# Patient Record
Sex: Female | Born: 1940 | Race: White | Hispanic: No | Marital: Married | Smoking: Never smoker
Health system: Southern US, Community
[De-identification: ages and names within clinical notes are randomized; demographics above are authoritative.]

## PROBLEM LIST (undated history)

## (undated) DIAGNOSIS — C801 Malignant (primary) neoplasm, unspecified: Secondary | ICD-10-CM

## (undated) DIAGNOSIS — Z9221 Personal history of antineoplastic chemotherapy: Secondary | ICD-10-CM

## (undated) DIAGNOSIS — M810 Age-related osteoporosis without current pathological fracture: Secondary | ICD-10-CM

## (undated) DIAGNOSIS — E785 Hyperlipidemia, unspecified: Secondary | ICD-10-CM

## (undated) DIAGNOSIS — C50919 Malignant neoplasm of unspecified site of unspecified female breast: Secondary | ICD-10-CM

## (undated) DIAGNOSIS — R011 Cardiac murmur, unspecified: Secondary | ICD-10-CM

## (undated) DIAGNOSIS — I10 Essential (primary) hypertension: Secondary | ICD-10-CM

## (undated) DIAGNOSIS — Z923 Personal history of irradiation: Secondary | ICD-10-CM

## (undated) HISTORY — DX: Malignant (primary) neoplasm, unspecified: C80.1

## (undated) HISTORY — DX: Essential (primary) hypertension: I10

## (undated) HISTORY — PX: BREAST BIOPSY: SHX20

## (undated) HISTORY — DX: Hyperlipidemia, unspecified: E78.5

## (undated) HISTORY — DX: Cardiac murmur, unspecified: R01.1

## (undated) HISTORY — DX: Age-related osteoporosis without current pathological fracture: M81.0

## (undated) NOTE — *Deleted (*Deleted)
Depression screen [facility] 2/9 01/25/2020 07/28/2019 07/26/2018  Decreased Interest 0 0 0  Down, Depressed, Hopeless 0 0 0  PHQ - 2 Score 0 0 0

---

## 1985-10-06 HISTORY — PX: ABDOMINAL HYSTERECTOMY: SHX81

## 1998-12-03 ENCOUNTER — Encounter: Payer: Self-pay | Admitting: Surgery

## 1998-12-03 ENCOUNTER — Ambulatory Visit (HOSPITAL_COMMUNITY): Admission: RE | Admit: 1998-12-03 | Discharge: 1998-12-03 | Payer: Self-pay | Admitting: Surgery

## 1999-07-17 ENCOUNTER — Encounter: Admission: RE | Admit: 1999-07-17 | Discharge: 1999-07-17 | Payer: Self-pay | Admitting: Obstetrics and Gynecology

## 2000-07-20 ENCOUNTER — Encounter: Admission: RE | Admit: 2000-07-20 | Discharge: 2000-07-20 | Payer: Self-pay | Admitting: Obstetrics and Gynecology

## 2000-07-20 ENCOUNTER — Encounter: Payer: Self-pay | Admitting: Obstetrics and Gynecology

## 2001-08-12 ENCOUNTER — Encounter: Payer: Self-pay | Admitting: Obstetrics and Gynecology

## 2001-08-12 ENCOUNTER — Encounter: Admission: RE | Admit: 2001-08-12 | Discharge: 2001-08-12 | Payer: Self-pay | Admitting: Obstetrics and Gynecology

## 2002-09-09 ENCOUNTER — Encounter: Payer: Self-pay | Admitting: Obstetrics and Gynecology

## 2002-09-09 ENCOUNTER — Encounter: Admission: RE | Admit: 2002-09-09 | Discharge: 2002-09-09 | Payer: Self-pay | Admitting: Obstetrics and Gynecology

## 2003-10-18 ENCOUNTER — Encounter: Admission: RE | Admit: 2003-10-18 | Discharge: 2003-10-18 | Payer: Self-pay | Admitting: Obstetrics and Gynecology

## 2005-06-20 ENCOUNTER — Encounter: Admission: RE | Admit: 2005-06-20 | Discharge: 2005-06-20 | Payer: Self-pay | Admitting: Obstetrics and Gynecology

## 2005-09-05 ENCOUNTER — Ambulatory Visit: Payer: Self-pay | Admitting: Family Medicine

## 2005-11-18 ENCOUNTER — Ambulatory Visit: Payer: Self-pay | Admitting: Family Medicine

## 2005-11-24 ENCOUNTER — Ambulatory Visit: Payer: Self-pay | Admitting: Internal Medicine

## 2005-11-25 ENCOUNTER — Ambulatory Visit: Payer: Self-pay | Admitting: Family Medicine

## 2005-12-01 ENCOUNTER — Ambulatory Visit: Payer: Self-pay | Admitting: Internal Medicine

## 2006-06-22 ENCOUNTER — Encounter: Admission: RE | Admit: 2006-06-22 | Discharge: 2006-06-22 | Payer: Self-pay | Admitting: Obstetrics and Gynecology

## 2008-04-27 ENCOUNTER — Encounter: Admission: RE | Admit: 2008-04-27 | Discharge: 2008-04-27 | Payer: Self-pay | Admitting: Obstetrics and Gynecology

## 2008-08-02 ENCOUNTER — Encounter: Payer: Self-pay | Admitting: Family Medicine

## 2008-08-03 ENCOUNTER — Ambulatory Visit: Payer: Self-pay | Admitting: Family Medicine

## 2008-08-03 DIAGNOSIS — Z85828 Personal history of other malignant neoplasm of skin: Secondary | ICD-10-CM | POA: Insufficient documentation

## 2008-08-03 DIAGNOSIS — I10 Essential (primary) hypertension: Secondary | ICD-10-CM | POA: Insufficient documentation

## 2008-08-03 LAB — CONVERTED CEMR LAB
Bilirubin Urine: NEGATIVE
Glucose, Urine, Semiquant: NEGATIVE
Nitrite: NEGATIVE
Specific Gravity, Urine: >=1.03
Urobilinogen, UA: 0.2
pH: 5

## 2008-08-07 LAB — CONVERTED CEMR LAB
ALT: 19 units/L (ref 0–35)
AST: 17 units/L (ref 0–37)
Albumin: 4 g/dL (ref 3.5–5.2)
Alkaline Phosphatase: 61 units/L (ref 39–117)
BUN: 19 mg/dL (ref 6–23)
Basophils Absolute: 0 10*3/uL (ref 0.0–0.1)
Basophils Relative: 0.6 % (ref 0.0–3.0)
Bilirubin, Direct: 0.1 mg/dL (ref 0.0–0.3)
CO2: 31 meq/L (ref 19–32)
Calcium: 9.8 mg/dL (ref 8.4–10.5)
Chloride: 107 meq/L (ref 96–112)
Cholesterol: 209 mg/dL (ref 0–200)
Creatinine, Ser: 0.9 mg/dL (ref 0.4–1.2)
Direct LDL: 117.8 mg/dL
Eosinophils Absolute: 0 10*3/uL (ref 0.0–0.7)
Eosinophils Relative: 0.8 % (ref 0.0–5.0)
GFR calc Af Amer: 80 mL/min
GFR calc non Af Amer: 66 mL/min
Glucose, Bld: 102 mg/dL — ABNORMAL HIGH (ref 70–99)
HCT: 38 % (ref 36.0–46.0)
HDL: 61 mg/dL (ref >39.0)
Hemoglobin: 13.2 g/dL (ref 12.0–15.0)
Lymphocytes Relative: 24.7 % (ref 12.0–46.0)
MCHC: 34.8 g/dL (ref 30.0–36.0)
MCV: 89.8 fL (ref 78.0–100.0)
Monocytes Absolute: 0.3 10*3/uL (ref 0.1–1.0)
Monocytes Relative: 6.4 % (ref 3.0–12.0)
Neutro Abs: 3.5 10*3/uL (ref 1.4–7.7)
Neutrophils Relative %: 67.5 % (ref 43.0–77.0)
Platelets: 149 10*3/uL — ABNORMAL LOW (ref 150–400)
Potassium: 4.7 meq/L (ref 3.5–5.1)
RBC: 4.23 M/uL (ref 3.87–5.11)
RDW: 12.3 % (ref 11.5–14.6)
Sodium: 146 meq/L — ABNORMAL HIGH (ref 135–145)
TSH: 3.16 microintl units/mL (ref 0.35–5.50)
Total Bilirubin: 0.7 mg/dL (ref 0.3–1.2)
Total CHOL/HDL Ratio: 3.4
Total Protein: 7.7 g/dL (ref 6.0–8.3)
Triglycerides: 48 mg/dL (ref 0–149)
VLDL: 10 mg/dL (ref 0–40)
WBC: 5 10*3/uL (ref 4.5–10.5)

## 2008-08-09 ENCOUNTER — Ambulatory Visit: Payer: Self-pay | Admitting: Family Medicine

## 2008-08-14 ENCOUNTER — Ambulatory Visit: Payer: Self-pay | Admitting: Gastroenterology

## 2008-08-22 ENCOUNTER — Encounter: Payer: Self-pay | Admitting: Gastroenterology

## 2008-08-22 ENCOUNTER — Ambulatory Visit: Payer: Self-pay | Admitting: Gastroenterology

## 2008-08-28 ENCOUNTER — Encounter: Payer: Self-pay | Admitting: Gastroenterology

## 2009-01-19 ENCOUNTER — Ambulatory Visit: Payer: Self-pay | Admitting: Family Medicine

## 2009-01-19 DIAGNOSIS — R5381 Other malaise: Secondary | ICD-10-CM | POA: Insufficient documentation

## 2009-01-19 DIAGNOSIS — R5383 Other fatigue: Secondary | ICD-10-CM | POA: Insufficient documentation

## 2009-01-19 LAB — CONVERTED CEMR LAB
ALT: 19 units/L (ref 0–35)
AST: 20 units/L (ref 0–37)
Albumin: 3.8 g/dL (ref 3.5–5.2)
Alkaline Phosphatase: 54 units/L (ref 39–117)
BUN: 20 mg/dL (ref 6–23)
Basophils Absolute: 0 10*3/uL (ref 0.0–0.1)
Basophils Relative: 0.8 % (ref 0.0–3.0)
Bilirubin, Direct: 0.1 mg/dL (ref 0.0–0.3)
CO2: 32 meq/L (ref 19–32)
Calcium: 9.3 mg/dL (ref 8.4–10.5)
Chloride: 101 meq/L (ref 96–112)
Creatinine, Ser: 0.6 mg/dL (ref 0.4–1.2)
Eosinophils Absolute: 0.1 10*3/uL (ref 0.0–0.7)
Eosinophils Relative: 1.2 % (ref 0.0–5.0)
Free T4: 0.8 ng/dL (ref 0.6–1.6)
GFR calc non Af Amer: 104.99 mL/min (ref >60)
Glucose, Bld: 98 mg/dL (ref 70–99)
HCT: 35.8 % — ABNORMAL LOW (ref 36.0–46.0)
Hemoglobin: 12.1 g/dL (ref 12.0–15.0)
Lymphocytes Relative: 24.5 % (ref 12.0–46.0)
Lymphs Abs: 1.4 10*3/uL (ref 0.7–4.0)
MCHC: 33.9 g/dL (ref 30.0–36.0)
MCV: 90.2 fL (ref 78.0–100.0)
Monocytes Absolute: 0.5 10*3/uL (ref 0.1–1.0)
Monocytes Relative: 8.3 % (ref 3.0–12.0)
Neutro Abs: 3.9 10*3/uL (ref 1.4–7.7)
Neutrophils Relative %: 65.2 % (ref 43.0–77.0)
Platelets: 185 10*3/uL (ref 150.0–400.0)
Potassium: 3.6 meq/L (ref 3.5–5.1)
RBC: 3.97 M/uL (ref 3.87–5.11)
RDW: 12.3 % (ref 11.5–14.6)
Sed Rate: 34 mm/hr — ABNORMAL HIGH (ref 0–22)
Sodium: 139 meq/L (ref 135–145)
T3, Free: 2.7 pg/mL (ref 2.3–4.2)
TSH: 2.65 microintl units/mL (ref 0.35–5.50)
Total Bilirubin: 0.7 mg/dL (ref 0.3–1.2)
Total Protein: 7.3 g/dL (ref 6.0–8.3)
WBC: 5.9 10*3/uL (ref 4.5–10.5)

## 2009-01-25 ENCOUNTER — Ambulatory Visit: Payer: Self-pay | Admitting: Family Medicine

## 2009-07-18 ENCOUNTER — Ambulatory Visit: Payer: Self-pay | Admitting: Family Medicine

## 2009-07-24 ENCOUNTER — Encounter: Admission: RE | Admit: 2009-07-24 | Discharge: 2009-07-24 | Payer: Self-pay | Admitting: Obstetrics and Gynecology

## 2009-08-27 ENCOUNTER — Ambulatory Visit: Payer: Self-pay | Admitting: Family Medicine

## 2009-08-29 LAB — CONVERTED CEMR LAB
ALT: 17 units/L (ref 0–35)
AST: 21 units/L (ref 0–37)
Albumin: 4.1 g/dL (ref 3.5–5.2)
Alkaline Phosphatase: 50 units/L (ref 39–117)
BUN: 13 mg/dL (ref 6–23)
Basophils Absolute: 0 10*3/uL (ref 0.0–0.1)
Basophils Relative: 0.8 % (ref 0.0–3.0)
Bilirubin, Direct: 0.1 mg/dL (ref 0.0–0.3)
CO2: 32 meq/L (ref 19–32)
Calcium: 9.4 mg/dL (ref 8.4–10.5)
Chloride: 104 meq/L (ref 96–112)
Cholesterol: 214 mg/dL — ABNORMAL HIGH (ref 0–200)
Creatinine, Ser: 0.8 mg/dL (ref 0.4–1.2)
Direct LDL: 140.1 mg/dL
Eosinophils Absolute: 0 10*3/uL (ref 0.0–0.7)
Eosinophils Relative: 0.7 % (ref 0.0–5.0)
GFR calc non Af Amer: 75.2 mL/min (ref >60)
Glucose, Bld: 105 mg/dL — ABNORMAL HIGH (ref 70–99)
HCT: 35.8 % — ABNORMAL LOW (ref 36.0–46.0)
HDL: 56.8 mg/dL (ref >39.00)
Hemoglobin: 12.3 g/dL (ref 12.0–15.0)
Lymphocytes Relative: 26.5 % (ref 12.0–46.0)
Lymphs Abs: 1.2 10*3/uL (ref 0.7–4.0)
MCHC: 34.3 g/dL (ref 30.0–36.0)
MCV: 92.8 fL (ref 78.0–100.0)
Monocytes Absolute: 0.3 10*3/uL (ref 0.1–1.0)
Monocytes Relative: 7.1 % (ref 3.0–12.0)
Neutro Abs: 3 10*3/uL (ref 1.4–7.7)
Neutrophils Relative %: 64.9 % (ref 43.0–77.0)
Platelets: 148 10*3/uL — ABNORMAL LOW (ref 150.0–400.0)
Potassium: 4.4 meq/L (ref 3.5–5.1)
RBC: 3.86 M/uL — ABNORMAL LOW (ref 3.87–5.11)
RDW: 12.8 % (ref 11.5–14.6)
Sodium: 142 meq/L (ref 135–145)
TSH: 2.31 microintl units/mL (ref 0.35–5.50)
Total Bilirubin: 0.8 mg/dL (ref 0.3–1.2)
Total CHOL/HDL Ratio: 4
Total Protein: 7.2 g/dL (ref 6.0–8.3)
Triglycerides: 56 mg/dL (ref 0.0–149.0)
VLDL: 11.2 mg/dL (ref 0.0–40.0)
WBC: 4.5 10*3/uL (ref 4.5–10.5)

## 2009-09-04 ENCOUNTER — Encounter: Payer: Self-pay | Admitting: Family Medicine

## 2009-09-04 ENCOUNTER — Ambulatory Visit: Payer: Self-pay | Admitting: Internal Medicine

## 2010-01-01 ENCOUNTER — Encounter: Payer: Self-pay | Admitting: *Deleted

## 2010-08-14 ENCOUNTER — Encounter: Admission: RE | Admit: 2010-08-14 | Discharge: 2010-08-14 | Payer: Self-pay | Admitting: Family Medicine

## 2010-08-23 ENCOUNTER — Encounter: Payer: Self-pay | Admitting: Family Medicine

## 2010-08-27 ENCOUNTER — Encounter: Admission: RE | Admit: 2010-08-27 | Discharge: 2010-08-27 | Payer: Self-pay | Admitting: Family Medicine

## 2010-08-28 ENCOUNTER — Encounter: Payer: Self-pay | Admitting: Internal Medicine

## 2010-09-03 ENCOUNTER — Encounter: Payer: Self-pay | Admitting: Family Medicine

## 2010-09-03 ENCOUNTER — Ambulatory Visit: Payer: Self-pay | Admitting: Family Medicine

## 2010-09-03 LAB — CONVERTED CEMR LAB
Bilirubin Urine: NEGATIVE
Glucose, Urine, Semiquant: NEGATIVE
Ketones, urine, test strip: NEGATIVE
Nitrite: NEGATIVE
Protein, U semiquant: NEGATIVE
Specific Gravity, Urine: 1.02
Urobilinogen, UA: 0.2
pH: 7

## 2010-09-04 ENCOUNTER — Encounter: Admission: RE | Admit: 2010-09-04 | Discharge: 2010-09-04 | Payer: Self-pay | Admitting: Family Medicine

## 2010-09-05 LAB — CONVERTED CEMR LAB
ALT: 16 units/L (ref 0–35)
AST: 18 units/L (ref 0–37)
Albumin: 4.2 g/dL (ref 3.5–5.2)
Alkaline Phosphatase: 59 units/L (ref 39–117)
BUN: 18 mg/dL (ref 6–23)
Basophils Absolute: 0 10*3/uL (ref 0.0–0.1)
Basophils Relative: 0.7 % (ref 0.0–3.0)
Bilirubin, Direct: 0.1 mg/dL (ref 0.0–0.3)
CO2: 30 meq/L (ref 19–32)
Calcium: 9.6 mg/dL (ref 8.4–10.5)
Chloride: 100 meq/L (ref 96–112)
Cholesterol: 218 mg/dL — ABNORMAL HIGH (ref 0–200)
Creatinine, Ser: 0.8 mg/dL (ref 0.4–1.2)
Direct LDL: 132.8 mg/dL
Eosinophils Absolute: 0.1 10*3/uL (ref 0.0–0.7)
Eosinophils Relative: 0.9 % (ref 0.0–5.0)
GFR calc non Af Amer: 76.08 mL/min (ref >60)
Glucose, Bld: 109 mg/dL — ABNORMAL HIGH (ref 70–99)
HCT: 36.2 % (ref 36.0–46.0)
HDL: 63.3 mg/dL (ref >39.00)
Hemoglobin: 12.5 g/dL (ref 12.0–15.0)
INR: 1 (ref 0.8–1.0)
Lymphocytes Relative: 18.4 % (ref 12.0–46.0)
Lymphs Abs: 1.1 10*3/uL (ref 0.7–4.0)
MCHC: 34.5 g/dL (ref 30.0–36.0)
MCV: 90.8 fL (ref 78.0–100.0)
Monocytes Absolute: 0.4 10*3/uL (ref 0.1–1.0)
Monocytes Relative: 6.3 % (ref 3.0–12.0)
Neutro Abs: 4.4 10*3/uL (ref 1.4–7.7)
Neutrophils Relative %: 73.7 % (ref 43.0–77.0)
Platelets: 183 10*3/uL (ref 150.0–400.0)
Potassium: 4.7 meq/L (ref 3.5–5.1)
Prothrombin Time: 10.9 s (ref 9.7–11.8)
RBC: 3.99 M/uL (ref 3.87–5.11)
RDW: 13.4 % (ref 11.5–14.6)
Sodium: 138 meq/L (ref 135–145)
TSH: 2.72 microintl units/mL (ref 0.35–5.50)
Total Bilirubin: 0.4 mg/dL (ref 0.3–1.2)
Total CHOL/HDL Ratio: 3
Total Protein: 7.1 g/dL (ref 6.0–8.3)
Triglycerides: 58 mg/dL (ref 0.0–149.0)
VLDL: 11.6 mg/dL (ref 0.0–40.0)
WBC: 5.9 10*3/uL (ref 4.5–10.5)
aPTT: 26.8 s (ref 21.7–28.8)

## 2010-09-09 ENCOUNTER — Ambulatory Visit: Payer: Self-pay | Admitting: Family Medicine

## 2010-09-11 ENCOUNTER — Ambulatory Visit (HOSPITAL_COMMUNITY)
Admission: RE | Admit: 2010-09-11 | Discharge: 2010-09-11 | Payer: Self-pay | Source: Home / Self Care | Admitting: General Surgery

## 2010-09-11 ENCOUNTER — Ambulatory Visit: Payer: Self-pay | Admitting: Oncology

## 2010-09-12 ENCOUNTER — Encounter: Payer: Self-pay | Admitting: Family Medicine

## 2010-09-12 LAB — CBC WITH DIFFERENTIAL/PLATELET
BASO%: 0.4 % (ref 0.0–2.0)
EOS%: 0.4 % (ref 0.0–7.0)
HCT: 36.1 % (ref 34.8–46.6)
LYMPH%: 13.5 % — ABNORMAL LOW (ref 14.0–49.7)
MCH: 30.1 pg (ref 25.1–34.0)
MCHC: 33.4 g/dL (ref 31.5–36.0)
MONO#: 0.3 10*3/uL (ref 0.1–0.9)
NEUT%: 81.6 % — ABNORMAL HIGH (ref 38.4–76.8)
Platelets: 191 10*3/uL (ref 145–400)
RBC: 4 10*6/uL (ref 3.70–5.45)
WBC: 8.1 10*3/uL (ref 3.9–10.3)
lymph#: 1.1 10*3/uL (ref 0.9–3.3)

## 2010-09-12 LAB — COMPREHENSIVE METABOLIC PANEL
ALT: 20 U/L (ref 0–35)
AST: 22 U/L (ref 0–37)
CO2: 29 mEq/L (ref 19–32)
Creatinine, Ser: 0.95 mg/dL (ref 0.40–1.20)
Sodium: 138 mEq/L (ref 135–145)
Total Bilirubin: 0.6 mg/dL (ref 0.3–1.2)
Total Protein: 7.7 g/dL (ref 6.0–8.3)

## 2010-09-13 DIAGNOSIS — M779 Enthesopathy, unspecified: Secondary | ICD-10-CM | POA: Insufficient documentation

## 2010-09-16 ENCOUNTER — Ambulatory Visit (HOSPITAL_COMMUNITY)
Admission: RE | Admit: 2010-09-16 | Discharge: 2010-09-16 | Payer: Self-pay | Source: Home / Self Care | Attending: General Surgery | Admitting: General Surgery

## 2010-09-17 ENCOUNTER — Ambulatory Visit (HOSPITAL_BASED_OUTPATIENT_CLINIC_OR_DEPARTMENT_OTHER): Admission: RE | Admit: 2010-09-17 | Payer: Self-pay | Source: Home / Self Care | Admitting: General Surgery

## 2010-10-03 ENCOUNTER — Ambulatory Visit (HOSPITAL_BASED_OUTPATIENT_CLINIC_OR_DEPARTMENT_OTHER): Admission: RE | Admit: 2010-10-03 | Payer: Self-pay | Admitting: General Surgery

## 2010-10-22 ENCOUNTER — Encounter
Admission: RE | Admit: 2010-10-22 | Discharge: 2010-10-22 | Payer: Self-pay | Source: Home / Self Care | Attending: General Surgery | Admitting: General Surgery

## 2010-10-23 ENCOUNTER — Ambulatory Visit
Admission: RE | Admit: 2010-10-23 | Discharge: 2010-10-23 | Payer: Self-pay | Source: Home / Self Care | Attending: General Surgery | Admitting: General Surgery

## 2010-10-23 ENCOUNTER — Encounter
Admission: RE | Admit: 2010-10-23 | Discharge: 2010-10-23 | Payer: Self-pay | Source: Home / Self Care | Attending: General Surgery | Admitting: General Surgery

## 2010-10-23 HISTORY — PX: BREAST LUMPECTOMY: SHX2

## 2010-10-23 LAB — CBC
HCT: 38.6 % (ref 36.0–46.0)
Hemoglobin: 12.6 g/dL (ref 12.0–15.0)
MCH: 29.3 pg (ref 26.0–34.0)
MCHC: 32.6 g/dL (ref 30.0–36.0)
MCV: 89.8 fL (ref 78.0–100.0)
Platelets: 216 10*3/uL (ref 150–400)
RBC: 4.3 MIL/uL (ref 3.87–5.11)
RDW: 13 % (ref 11.5–15.5)
WBC: 5.7 10*3/uL (ref 4.0–10.5)

## 2010-10-23 LAB — COMPREHENSIVE METABOLIC PANEL
ALT: 20 U/L (ref 0–35)
AST: 22 U/L (ref 0–37)
Albumin: 3.8 g/dL (ref 3.5–5.2)
Alkaline Phosphatase: 58 U/L (ref 39–117)
BUN: 16 mg/dL (ref 6–23)
CO2: 30 mEq/L (ref 19–32)
Calcium: 9.5 mg/dL (ref 8.4–10.5)
Chloride: 99 mEq/L (ref 96–112)
Creatinine, Ser: 0.86 mg/dL (ref 0.4–1.2)
GFR calc Af Amer: >60 mL/min (ref >60)
GFR calc non Af Amer: >60 mL/min (ref >60)
Glucose, Bld: 89 mg/dL (ref 70–99)
Potassium: 4.9 mEq/L (ref 3.5–5.1)
Sodium: 137 mEq/L (ref 135–145)
Total Bilirubin: 0.6 mg/dL (ref 0.3–1.2)
Total Protein: 7.3 g/dL (ref 6.0–8.3)

## 2010-10-23 LAB — URINALYSIS, ROUTINE W REFLEX MICROSCOPIC
Bilirubin Urine: NEGATIVE
Hgb urine dipstick: NEGATIVE
Ketones, ur: NEGATIVE mg/dL
Nitrite: NEGATIVE
Protein, ur: NEGATIVE mg/dL
Specific Gravity, Urine: 1.016 (ref 1.005–1.030)
Urine Glucose, Fasting: NEGATIVE mg/dL
Urobilinogen, UA: 0.2 mg/dL (ref 0.0–1.0)
pH: 7.5 (ref 5.0–8.0)

## 2010-10-23 LAB — DIFFERENTIAL
Basophils Absolute: 0 10*3/uL (ref 0.0–0.1)
Basophils Relative: 1 % (ref 0–1)
Eosinophils Absolute: 0.1 10*3/uL (ref 0.0–0.7)
Eosinophils Relative: 1 % (ref 0–5)
Lymphocytes Relative: 23 % (ref 12–46)
Lymphs Abs: 1.3 10*3/uL (ref 0.7–4.0)
Monocytes Absolute: 0.4 10*3/uL (ref 0.1–1.0)
Monocytes Relative: 7 % (ref 3–12)
Neutro Abs: 3.9 10*3/uL (ref 1.7–7.7)
Neutrophils Relative %: 69 % (ref 43–77)

## 2010-10-23 LAB — URINE MICROSCOPIC-ADD ON

## 2010-10-27 ENCOUNTER — Encounter: Payer: Self-pay | Admitting: General Surgery

## 2010-10-28 NOTE — Op Note (Signed)
NAME:  Briana Smith, Briana Smith              ACCOUNT NO.:  [id number]  MEDICAL RECORD NO.:  [id number]          PATIENT TYPE:  AMB  LOCATION:  DSC                          FACILITY:  [facility]  PHYSICIAN:  [name]. Derrell Lolling, M.D.DATE OF BIRTH:  Jan 07, 1941  DATE OF PROCEDURE:  10/23/2010 DATE OF DISCHARGE:                              OPERATIVE REPORT   PREOPERATIVE DIAGNOSIS:  Invasive ductal carcinoma, left breast, receptor positive, HER-2 negative, clinical stage T1c, N0.  POSTOPERATIVE DIAGNOSIS:  Invasive ductal carcinoma, left breast, receptor positive, HER-2 negative, clinical stage T1c, N0.  OPERATIONS PERFORMED: 1. Inject blue dye, left breast. 2. Left partial mastectomy with needle localization. 3. Left axillary sentinel lymph node mapping and biopsy.  SURGEON:  [name]. Derrell Lolling, MD  OPERATIVE INDICATIONS:  This is a 70 year old Caucasian female who underwent screening mammograms and subsequent other imaging studies. There was a small breast mass in the left breast at the 5 o'clock position, 5 cm from the nipple.  Core biopsy showed invasive ductal carcinoma, which is receptor positive and HER-2 negative.  MRI showed a 1.7-cm mass in the left breast at the 5 o'clock position, otherwise negative.  This was felt to be a solitary finding, and there was no adenopathy.  She has been evaluated by Dr. Drue Second. Oncotype DX has been sent but is pending.  The patient was interested in breast conservation.  She was brought to the operating room for left partial mastectomy and sentinel node biopsy.  OPERATIVE TECHNIQUE:  Prior to her surgery, she underwent wire localization at the [facility] [place].  The wire was inserted in the most inferior aspect of the left breast and directed superiorly behind the mass.  The patient was brought to [facility]. Following sedation, her left breast was injected with radionuclide by the nuclear medicine technician.  The patient  was taken to the operating room.  General anesthesia was induced.  Following an alcohol prep, I injected 5 mL of blue dye in the left breast subareolar area.  This was 2 mL of methylene blue mixed with 3 mL of saline.  The breast was massaged for 5 minutes.  Surgical time-out was held identifying correct patient, correct procedure, and correct site.  The left breast and axilla and chest wall and shoulder were then prepped and draped in a sterile fashion.  The marker wire appeared to be almost at the 6 o'clock position.  After marking all the anatomic boundaries, I made a radially oriented elliptical incision around the wire.  Dissection was carried down around the wire going widely around it and deep into the breast tissues.  The specimen was removed, and it was marked with a 6-color margin marker kit.  Specimen mammogram was performed, and it looked like the radiographic abnormality was in the exact center of the specimen.  This was sent for routine histology.  The breast incision was irrigated with saline.  Hemostasis was excellent and achieved with electrocautery.  The breast tissue was closed in three layers, two layers of interrupted 3-0 Vicryl, and then running subcuticular suture of 4-0 Monocryl for the skin.  Attention was  then directed to the left axilla.  A NeoProbe was used to listen to the radioactivity.  Marcaine 0.5% with epinephrine was used as a local infiltration anesthetic.  A curvilinear incision was made at the skin crease just at the lower end of the hairline.  Dissection was carried down through the subcutaneous tissue.  The clavipectoral fascia was incised.  I had dissected out three sentinel lymph nodes.  Two of these were in the level I lymph node area and were hot and blue.  One of these was in the subpectoral location and it was hot only but not blue. After this was done, there really was very little radioactivity in the axilla.  The axilla was irrigated with  saline.  Hemostasis was excellent.  The axilla was closed in layers.  The deeper layer of interrupted 3-0 Vicryl and the skin was closed with a running subcuticular suture of 4-0 Monocryl and Dermabond.  Clean bandages and a breast binder was placed.  The patient tolerated the procedure well and was taken to the recovery room in stable condition.  Estimated blood loss was about 20 mL.  Complications were none.  Sponge, needle, and instrument counts were correct.     [name]. Derrell Lolling, M.D.     HMI/MEDQ  D:  10/23/2010  T:  10/24/2010  Job:  [id number]  cc:   Tinnie Gens A. Tawanna Cooler, MD Drue Second, MD [facility]  Electronically Signed by Claud Kelp M.D. on 10/28/2010 08:40:00 AM

## 2010-11-01 ENCOUNTER — Ambulatory Visit: Payer: Self-pay | Admitting: Oncology

## 2010-11-07 NOTE — Miscellaneous (Signed)
Summary: Consent to Procedure  Consent to Procedure   Imported By: Maryln Gottron 09/11/2010 14:33:46  _____________________________________________________________________  External Attachment:    Type:   Image     Comment:   External Document

## 2010-11-07 NOTE — Consult Note (Signed)
Summary: [facility]  [facility]   Imported By: Maryln Gottron 10/03/2010 08:24:55  _____________________________________________________________________  External Attachment:    Type:   Image     Comment:   External Document

## 2010-11-07 NOTE — Assessment & Plan Note (Signed)
Summary: spot removal//ccm   Vital Signs:  Patient profile:   70 year old female Menstrual status:  hysterectomy Height:      60.25 inches Weight:      172 pounds Pulse rate:   67 / minute BP sitting:   122 / 78  (left arm) Cuff size:   regular  Vitals Entered By: Kathlene November LPN (September 09, 2010 11:54 AM) CC: areas removed from back and left hip   CC:  areas removed from back and left hip.  History of Present Illness: Briana Smith is a 70 year old female, who comes in today for removal of 4 abnormal-looking lesions.  She's had a history of skin cancer in the past.  Lesion number one...Marland KitchenMarland Kitchen 10-mm x 10 mm  just to the left of T 10.  Lower back  Lesion number two.... 15 mm   x 15 mm just to the right of L1.  Lesion number 3.... 15-mm.  x  15-mm left mid thigh.  Lesion number 4  10 mm x 10 mmi........Marland Kitchen   Left lateral thigh  After signed informed consent all 4 lesions were prepped........ shaved excision was done........and all 4 lesions were sent for pathologic analysis. the bases were cauterized and sterile Band-Aids were applied.  The patient left the office in good condition.  No complications.  She was advised on local wound care.  Current Medications (verified): 1)  Zestoretic 20-12.5 Mg Tabs (Lisinopril-Hydrochlorothiazide) .... Take 1/2  Tablet By Mouth Every Morning 2)  Vitamin D 1000 Unit Caps (Cholecalciferol) .... Once Daily 3)  Calcium 1500 Mg Tabs (Calcium Carbonate) .... Once Daily 4)  Timolol Maleate 0.5 % Soln (Timolol Maleate) .... Use As Directed  Allergies (verified): No Known Drug Allergies  Comments:  Nurse/Medical Assistant: The patient's medications and allergies were reviewed with the patient and were updated in the Medication and Allergy Lists. Kathlene November LPN (September 09, 2010 11:55 AM)   Complete Medication List: 1)  Zestoretic 20-12.5 Mg Tabs (Lisinopril-hydrochlorothiazide) .... Take 1/2  tablet by mouth every morning 2)  Vitamin D 1000 Unit Caps  (Cholecalciferol) .... Once daily 3)  Calcium 1500 Mg Tabs (Calcium carbonate) .... Once daily 4)  Timolol Maleate 0.5 % Soln (Timolol maleate) .... Use as directed  Other Orders: Shave Skin Lesion 0.6-1.0 cm/trunk/arm/leg (78295) Prescriptions: ZESTORETIC 20-12.5 MG TABS (LISINOPRIL-HYDROCHLOROTHIAZIDE) Take 1/2  tablet by mouth every morning  #90 x 3   Entered by:   Kern Reap CMA (AAMA)   Authorized by:   Roderick Pee MD   Signed by:   Kern Reap CMA (AAMA) on 09/09/2010   Method used:   Electronically to        [facility]  [phone]* (retail)       [address]       [place], [place]  [address]       Ph: [phone] or [phone]       Fax: [phone]   RxID:   [id number]    Orders Added: 1)  Shave Skin Lesion 0.6-1.0 cm/trunk/arm/leg [11301]

## 2010-11-07 NOTE — Assessment & Plan Note (Signed)
Summary: emp/pt coming in fasting/cjr   Vital Signs:  Patient profile:   70 year old female Menstrual status:  hysterectomy Height:      60.25 inches Weight:      170 pounds BMI:     33.04 Temp:     97.9 degrees F oral BP sitting:   124 / 80  (left arm) Cuff size:   regular  Vitals Entered By: Kern Reap CMA Duncan Dull) (September 03, 2010 8:40 AM) CC: wellness exam     Menstrual Status hysterectomy   CC:  wellness exam.  History of Present Illness: Briana Smith is a delightful, 70 year old, married female, nonsmoker, who comes in today for her annual Medicare wellness exam.  Because of a history of underlying hypertension and newly diagnosed left breast cancer.  Her hypertension has been treated with Zestoretic 20 -- 12.5 daily.  BP 124/80.  She complains of being lightheaded sometimes with change in position therefore, cut her dose in half.  She takes aspirin, calcium, and vitamin D.  She's also been on Premarin, .625 ...Marland KitchenMarland KitchenMarland Kitchen3 times weekly from her GYN doctor [place] since she had a TAH?BSO for fibroids......... no cancer......Marland Kitchen many years ago.  Since the breast cancer was diagnosed last week.  She stopped the hormones.  She gets routine eye care from Dr. 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  She is on eyedrops.  She gets routine dental care is not check her breasts monthly.  However, does mammography.  She is to have an MRI today and a surgical consult with Dr. Derrell Lolling tomorrow.her son, who is a Development worker, community, who graduated from [employer], however,  never did a residency is with an investment  banking firm in [place]  encouraged her to go to [place].  I encouraged her to stay here.she wishes to stay here  Colonoscopy due in 2019, tetanus, 2003, seasonal flu 2010, Pneumovax 2007, shingles.  Vaccine 2010. Here for Medicare AWV:  1.   Risk factors based on Past M, S, F history:..see above 2.   Physical Activities: .Marland Kitchen..walks daily 3.    Depression/mood: good mood.  No depression 4.   Hearing: normal 5.   ADL's: functions independently 6.   Fall Risk: reviewed.  None identified 7.   Home Safety: no guns in the house 8.   Height, weight, &visual acuity:height weight, normal.  Vision normal early glaucoma on eyedrops 9.   Counseling: .Marland Kitchen..see instructions 11.           Referral Coordination....none indicated 12.           Care Plan..follow-up with, surgeon and, radiation oncologist 13.            Cognitive Assessment ..a/o x 3 financially independent   Allergies: No Known Drug Allergies  Past History:  Past medical, surgical, family and social histories (including risk factors) reviewed, and no changes noted (except as noted below).  Past Medical History: Reviewed history from 08/03/2008 and no changes required. childbirth x 3 TAH and BSO for nonmalignant reasons breast biopsy benign Hypertension Skin cancer, hx of  Family History: Reviewed history from 08/03/2008 and no changes required. father died from COPD, a smoker mother died from Alzheimer's disease and underlying diabetes 7 brothers, one has hypertension, when it came to the jaw.  One had bladder cancer.  Mom is obese and hypertensive.  The other 3 in good health two sisters, one with high blood pressure  Social History: Reviewed history from 08/03/2008 and no  changes required. Retired Married Never Smoked Alcohol use-no Drug use-no Regular exercise-yes  Review of Systems      See HPI  Physical Exam  General:  Well-developed,well-nourished,in no acute distress; alert,appropriate and cooperative throughout examination Head:  Normocephalic and atraumatic without obvious abnormalities. No apparent alopecia or balding. Eyes:  No corneal or conjunctival inflammation noted. EOMI. Perrla. Funduscopic exam benign, without hemorrhages, exudates or papilledema. Vision grossly normal. Ears:  External ear exam shows no significant lesions or deformities.   Otoscopic examination reveals clear canals, tympanic membranes are intact bilaterally without bulging, retraction, inflammation or discharge. Hearing is grossly normal bilaterally. Nose:  External nasal examination shows no deformity or inflammation. Nasal mucosa are pink and moist without lesions or exudates. Mouth:  Oral mucosa and oropharynx without lesions or exudates.  Teeth in good repair. Neck:  No deformities, masses, or tenderness noted. Chest Wall:  No deformities, masses, or tenderness noted. Breasts:  Band-Aid and Steri-Strips at the 6 o'clock position left breast from previous biopsy this week.  No palpable masses Lungs:  Normal respiratory effort, chest expands symmetrically. Lungs are clear to auscultation, no crackles or wheezes. Heart:  Normal rate and regular rhythm. S1 and S2 normal without gallop, murmur, click, rub or other extra sounds. Abdomen:  Bowel sounds positive,abdomen soft and non-tender without masses, organomegaly or hernias noted. Rectal:  No external abnormalities noted. Normal sphincter tone. No rectal masses or tenderness. Genitalia:  Pelvic Exam:        External: normal female genitalia without lesions or masses        Vagina: normal without lesions or masses        Cervix: normal without lesions or masses        Adnexa: normal bimanual exam without masses or fullness        Uterus: normal by palpation        Pap smear: not performed Msk:  No deformity or scoliosis noted of thoracic or lumbar spine.   Pulses:  R and L carotid,radial,femoral,dorsalis pedis and posterior tibial pulses are full and equal bilaterally Extremities:  No clubbing, cyanosis, edema, or deformity noted with normal full range of motion of all joints.   Neurologic:  No cranial nerve deficits noted. Station and gait are normal. Plantar reflexes are down-going bilaterally. DTRs are symmetrical throughout. Sensory, motor and coordinative functions appear intact. Skin:  two lesions on her  back two on her left hip that need to be excised Cervical Nodes:  No lymphadenopathy noted Axillary Nodes:  No palpable lymphadenopathy Inguinal Nodes:  No significant adenopathy Psych:  Cognition and judgment appear intact. Alert and cooperative with normal attention span and concentration. No apparent delusions, illusions, hallucinations   Impression & Recommendations:  Problem # 1:  DUCTAL CARCINOMA IN SITU, LEFT BREAST (ICD-233.0) Assessment New  Orders: Venipuncture (16109) TLB-Lipid Panel (80061-LIPID) TLB-BMP (Basic Metabolic Panel-BMET) (80048-METABOL) TLB-CBC Platelet - w/Differential (85025-CBCD) TLB-Hepatic/Liver Function Pnl (80076-HEPATIC) TLB-TSH (Thyroid Stimulating Hormone) (84443-TSH) TLB-PT (Protime) (85610-PTP) TLB-PTT (85730-PTTL) Prescription Created Electronically [phone]) Medicare -1st Annual Wellness Visit [phone]) Urinalysis-dipstick only (Medicare patient) ([id number])  Problem # 2:  HYPERTENSION (ICD-401.9) Assessment: Improved  Her updated medication list for this problem includes:    Zestoretic 20-12.5 Mg Tabs (Lisinopril-hydrochlorothiazide) .Marland Kitchen... Take 1/2  tablet by mouth every morning  Orders: Venipuncture (56213) TLB-Lipid Panel (80061-LIPID) TLB-BMP (Basic Metabolic Panel-BMET) (80048-METABOL) TLB-CBC Platelet - w/Differential (85025-CBCD) TLB-Hepatic/Liver Function Pnl (80076-HEPATIC) TLB-TSH (Thyroid Stimulating Hormone) (84443-TSH) TLB-PT (Protime) (85610-PTP) TLB-PTT (85730-PTTL) Prescription Created Electronically [phone]) Medicare -1st Annual Wellness  Visit [phone]) Urinalysis-dipstick only (Medicare patient) (98119JY) EKG w/ Interpretation (93000)  Problem # 3:  Preventive Health Care (ICD-V70.0) Assessment: Unchanged  Complete Medication List: 1)  Zestoretic 20-12.5 Mg Tabs (Lisinopril-hydrochlorothiazide) .... Take 1/2  tablet by mouth every morning 2)  Vitamin D 1000 Unit Caps (Cholecalciferol) .... Once daily 3)  Calcium 1500 Mg  Tabs (Calcium carbonate) .... Once daily 4)  Timolol Maleate 0.5 % Soln (Timolol maleate) .... Use as directed  Other Orders: Specimen Handling (78295)  Patient Instructions: 1)  stop the aspirin. 2)  Return next week to remove the two lesions on your hips, and the two on your back. 3)  Stop the hormone replacement therapy. 4)  Please schedule a follow-up appointment in 1 year. 5)  Please schedule a follow-up appointment as needed. 6)  It is important that you exercise regularly at least 20 minutes 5 times a week. If you develop chest pain, have severe difficulty breathing, or feel very tired , stop exercising immediately and seek medical attention. 7)  kept the blood pressure medication in half.  Check her blood pressure daily in the morning.  When u  return to have the lesions removed bring  your blood pressure readings and the device Prescriptions: ZESTORETIC 20-12.5 MG TABS (LISINOPRIL-HYDROCHLOROTHIAZIDE) Take 1/2  tablet by mouth every morning  #50 x 3   Entered and Authorized by:   Roderick Pee MD   Signed by:   Roderick Pee MD on 09/03/2010   Method used:   Electronically to        [facility]  [phone]* (retail)       [address]       [place], [place]  [address]       Ph: [phone] or [phone]       Fax: [phone]   RxID:   [id number]    Orders Added: 1)  Venipuncture [29518] 2)  TLB-Lipid Panel [80061-LIPID] 3)  TLB-BMP (Basic Metabolic Panel-BMET) [80048-METABOL] 4)  TLB-CBC Platelet - w/Differential [85025-CBCD] 5)  TLB-Hepatic/Liver Function Pnl [80076-HEPATIC] 6)  TLB-TSH (Thyroid Stimulating Hormone) [84443-TSH] 7)  TLB-PT (Protime) [85610-PTP] 8)  TLB-PTT [85730-PTTL] 9)  Prescription Created Electronically [G8553] 10)  Medicare -1st Annual Wellness Visit [G0438] 11)  Urinalysis-dipstick only (Medicare patient) [81003QW] 12)  EKG w/ Interpretation [93000] 13)  Specimen Handling  [99000]       Laboratory Results   Urine Tests    Routine Urinalysis   Color: yellow Appearance: Clear Glucose: negative   (Normal Range: Negative) Bilirubin: negative   (Normal Range: Negative) Ketone: negative   (Normal Range: Negative) Spec. Gravity: 1.020   (Normal Range: 1.003-1.035) Blood: trace-intact   (Normal Range: Negative) pH: 7.0   (Normal Range: 5.0-8.0) Protein: negative   (Normal Range: Negative) Urobilinogen: 0.2   (Normal Range: 0-1) Nitrite: negative   (Normal Range: Negative) Leukocyte Esterace: trace   (Normal Range: Negative)    Comments: Briana Smith  September 03, 2010 11:43 AM

## 2010-11-07 NOTE — Miscellaneous (Signed)
Summary: eye exam  Clinical Lists Changes  Observations: Added new observation of EYES COMMENT: 12/2010 (01/01/2010 11:04) Added new observation of EYE EXAM BY: bevis (12/12/2009 11:05) Added new observation of DMEYEEXMRES: normal (12/12/2009 11:05) Added new observation of DIAB EYE EX: normal (12/12/2009 11:05)      Diabetes Management History:      She says that she is exercising.    Diabetes Management Exam:    Eye Exam:       Eye Exam done elsewhere          Date: 12/12/2009          Results: normal          Done by: Vonna Kotyk

## 2010-11-27 ENCOUNTER — Other Ambulatory Visit: Payer: Self-pay | Admitting: Oncology

## 2010-11-27 ENCOUNTER — Encounter (HOSPITAL_BASED_OUTPATIENT_CLINIC_OR_DEPARTMENT_OTHER): Payer: PRIVATE HEALTH INSURANCE | Admitting: Oncology

## 2010-11-27 DIAGNOSIS — C50919 Malignant neoplasm of unspecified site of unspecified female breast: Secondary | ICD-10-CM

## 2010-11-27 LAB — COMPREHENSIVE METABOLIC PANEL
AST: 18 U/L (ref 0–37)
BUN: 13 mg/dL (ref 6–23)
Calcium: 9.3 mg/dL (ref 8.4–10.5)
Chloride: 100 mEq/L (ref 96–112)
Creatinine, Ser: 0.75 mg/dL (ref 0.40–1.20)
Total Bilirubin: 0.4 mg/dL (ref 0.3–1.2)

## 2010-11-27 LAB — CBC WITH DIFFERENTIAL/PLATELET
BASO%: 0.3 % (ref 0.0–2.0)
Basophils Absolute: 0 10*3/uL (ref 0.0–0.1)
EOS%: 0.9 % (ref 0.0–7.0)
HCT: 37.3 % (ref 34.8–46.6)
HGB: 12.7 g/dL (ref 11.6–15.9)
LYMPH%: 26.3 % (ref 14.0–49.7)
MCH: 30.1 pg (ref 25.1–34.0)
MCHC: 34 g/dL (ref 31.5–36.0)
MCV: 88.8 fL (ref 79.5–101.0)
MONO%: 8.8 % (ref 0.0–14.0)
NEUT%: 63.7 % (ref 38.4–76.8)
Platelets: 187 10*3/uL (ref 145–400)
lymph#: 1.5 10*3/uL (ref 0.9–3.3)

## 2010-12-13 ENCOUNTER — Encounter (HOSPITAL_BASED_OUTPATIENT_CLINIC_OR_DEPARTMENT_OTHER): Payer: PRIVATE HEALTH INSURANCE | Admitting: Oncology

## 2010-12-18 ENCOUNTER — Encounter (HOSPITAL_BASED_OUTPATIENT_CLINIC_OR_DEPARTMENT_OTHER)
Admission: RE | Admit: 2010-12-18 | Discharge: 2010-12-18 | Disposition: A | Discharge status: Home / Self Care | Payer: Medicare Other | Source: Ambulatory Visit | Attending: General Surgery | Admitting: General Surgery

## 2010-12-18 LAB — BASIC METABOLIC PANEL
Chloride: 103 mEq/L (ref 96–112)
GFR calc non Af Amer: >60 mL/min (ref >60)
Glucose, Bld: 99 mg/dL (ref 70–99)
Potassium: 4.1 mEq/L (ref 3.5–5.1)
Sodium: 137 mEq/L (ref 135–145)

## 2010-12-20 ENCOUNTER — Ambulatory Visit (HOSPITAL_COMMUNITY): Payer: Medicare Other | Attending: General Surgery

## 2010-12-20 ENCOUNTER — Ambulatory Visit (HOSPITAL_BASED_OUTPATIENT_CLINIC_OR_DEPARTMENT_OTHER)
Admission: RE | Admit: 2010-12-20 | Discharge: 2010-12-20 | Disposition: A | Discharge status: Home / Self Care | Payer: Medicare Other | Source: Ambulatory Visit | Attending: General Surgery | Admitting: General Surgery

## 2010-12-20 ENCOUNTER — Ambulatory Visit (HOSPITAL_COMMUNITY): Payer: Medicare Other

## 2010-12-20 DIAGNOSIS — Z01812 Encounter for preprocedural laboratory examination: Secondary | ICD-10-CM | POA: Insufficient documentation

## 2010-12-20 DIAGNOSIS — E669 Obesity, unspecified: Secondary | ICD-10-CM | POA: Insufficient documentation

## 2010-12-20 DIAGNOSIS — C50919 Malignant neoplasm of unspecified site of unspecified female breast: Secondary | ICD-10-CM | POA: Insufficient documentation

## 2010-12-20 DIAGNOSIS — Z01818 Encounter for other preprocedural examination: Secondary | ICD-10-CM | POA: Insufficient documentation

## 2010-12-20 LAB — POCT HEMOGLOBIN-HEMACUE: Hemoglobin: 12.2 g/dL (ref 12.0–15.0)

## 2010-12-23 NOTE — Op Note (Signed)
Briana Smith, DURAND              ACCOUNT NO.:  [id number]  MEDICAL RECORD NO.:  [id number]           PATIENT TYPE:  LOCATION:                                 FACILITY:  PHYSICIAN:  Briana Smith, M.D.DATE OF BIRTH:  1940/10/14  DATE OF PROCEDURE:  12/20/2010 DATE OF DISCHARGE:                              OPERATIVE REPORT   PREOPERATIVE DIAGNOSIS:  Invasive ductal carcinoma, left breast, pT1c, N0.  POSTOPERATIVE DIAGNOSIS:  Invasive ductal carcinoma, left breast.  OPERATION PERFORMED:  Insert 8-French X-Port venous vascular access device using fluoroscopic guidance and using ultrasound guidance.  SURGEON:  Briana Mould. Derrell Lolling, MD  OPERATIVE INDICATIONS:  This is a 70 year old Caucasian female who recently underwent left partial mastectomy and left axillary sentinel node mapping and biopsy for what turns out to be an invasive ductal carcinoma, pathologic stage T1c, N1a, receptor positive, HER2-negative. She has been evaluated by Medical Oncology and they advised that she undergo adjuvant chemotherapy, to be followed by adjuvant whole-breast radiation.  She has been counseled regarding insertion of Port-A-Cath as an outpatient, was brought to the operating room electively.  OPERATIVE TECHNIQUE:  The patient was brought to the operating room. General anesthesia with an LMA device was induced.  The patient was positioned with the arms at her sides and small roll behind her shoulders.  The neck and chest were prepped and draped in sterile fashion.  Intravenous antibiotics were given.  Surgical time-out was held identifying correct patient and correct procedure.  The patient was placed in Trendelenburg.  1% Xylocaine with epinephrine was used for local infiltration anesthetic.  I attempted a right subclavian venipuncture and made three passes and could not get any blood return whatsoever.  I chose to abandon this.  I used the ultrasound in the right neck I could see the  internal jugular vein and carotid artery.  These were actually very small caliber vessels.  I attempted percutaneous venipuncture but was unable to, I actually got the venipuncture needle into the vein, but could not thread the wire.   I then brought the ultrasound probe back and held in place and used that as guide and I observed the needle going into the vein and then got good blood return. I then threaded the wire end of the superior vena cava.  Fluoroscopy was used to confirm the tip of the wire which actually went into the right ventricle, so we pulled this back a bit.  I made a small transverse incision at the site.  I made a transverse incision in the right infraclavicular area, I debrided some of the subcutaneous fat and cleaned off the pectoralis fascia.  Using a tunneling device, I drew the catheter from the wire insertion site in the right neck down into the port pocket site in the right infraclavicular area.  I brought the fluoroscope machine back to the operating field and mapped out a template on the chest wall where I thought the tip of the catheter should be.  I went with the catheter in the superior vena cava just above the right atrium.  I then cut the catheter 22.5 cm which  looked like the right length.  The catheter was then secured the port with a locking device and flushed with heparinized saline.  The port was sutured to the pectoralis fascia with 3 interrupted sutures of 2-0 Prolene.  I was able to flush the port in the catheter one more time.  With the patient in Trendelenburg position, I inserted the dilator and peel-away sheath over the guidewire into the central venous circulation.  This was uneventful.  The wire and dilator were removed.  I had excellent blood return.  I threaded the catheter into the peel-away sheath and removed the peel-away sheath.  The catheter flushed easily and had excellent blood return.  I flushed the port and catheter with concerned  heparin.  Fluoroscopy was then again used and I could see that the tip of the catheter was in the superior vena cava right at the right atrial junction.  There was no deformity of the catheter anywhere along its course.  The subcutaneous tissue was closed with 3-0 Vicryl sutures and skin incisions were closed with subcuticular suture of 4-0 Monocryl and Dermabond.  The patient taken to recovery room in stable condition.  Chest x-ray will be obtained in the recovery room.  Sponge, needle, and instruments counts were correct.     Briana Smith, M.D.     HMI/MEDQ  D:  12/20/2010  T:  12/21/2010  Job:  [id number]  cc:   Valentino Hue. Magrinat, M.D. Jeffrey A. Tawanna Cooler, MD  Electronically Signed by Claud Kelp M.D. on 12/23/2010 09:24:00 AM

## 2010-12-31 ENCOUNTER — Encounter (HOSPITAL_BASED_OUTPATIENT_CLINIC_OR_DEPARTMENT_OTHER): Payer: PRIVATE HEALTH INSURANCE | Admitting: Oncology

## 2010-12-31 ENCOUNTER — Other Ambulatory Visit: Payer: Self-pay | Admitting: Oncology

## 2010-12-31 DIAGNOSIS — C50919 Malignant neoplasm of unspecified site of unspecified female breast: Secondary | ICD-10-CM

## 2010-12-31 DIAGNOSIS — Z5111 Encounter for antineoplastic chemotherapy: Secondary | ICD-10-CM

## 2010-12-31 LAB — CBC WITH DIFFERENTIAL/PLATELET
Basophils Absolute: 0 10*3/uL (ref 0.0–0.1)
Eosinophils Absolute: 0 10*3/uL (ref 0.0–0.5)
HCT: 36.7 % (ref 34.8–46.6)
HGB: 12.6 g/dL (ref 11.6–15.9)
MCV: 89.3 fL (ref 79.5–101.0)
NEUT#: 9.4 10*3/uL — ABNORMAL HIGH (ref 1.5–6.5)
NEUT%: 86.5 % — ABNORMAL HIGH (ref 38.4–76.8)
RDW: 13.2 % (ref 11.2–14.5)
lymph#: 1.1 10*3/uL (ref 0.9–3.3)

## 2010-12-31 LAB — COMPREHENSIVE METABOLIC PANEL
Albumin: 2.8 g/dL — ABNORMAL LOW (ref 3.5–5.2)
BUN: 16 mg/dL (ref 6–23)
Calcium: 7.2 mg/dL — ABNORMAL LOW (ref 8.4–10.5)
Chloride: 112 mEq/L (ref 96–112)
Glucose, Bld: 114 mg/dL — ABNORMAL HIGH (ref 70–99)
Potassium: 2.8 mEq/L — ABNORMAL LOW (ref 3.5–5.3)

## 2011-01-01 ENCOUNTER — Encounter (HOSPITAL_BASED_OUTPATIENT_CLINIC_OR_DEPARTMENT_OTHER): Payer: Medicare Other | Admitting: Oncology

## 2011-01-01 DIAGNOSIS — C50919 Malignant neoplasm of unspecified site of unspecified female breast: Secondary | ICD-10-CM

## 2011-01-01 DIAGNOSIS — Z5189 Encounter for other specified aftercare: Secondary | ICD-10-CM

## 2011-01-07 ENCOUNTER — Encounter (HOSPITAL_BASED_OUTPATIENT_CLINIC_OR_DEPARTMENT_OTHER): Payer: PRIVATE HEALTH INSURANCE | Admitting: Oncology

## 2011-01-07 ENCOUNTER — Other Ambulatory Visit: Payer: Self-pay | Admitting: Oncology

## 2011-01-07 DIAGNOSIS — C50919 Malignant neoplasm of unspecified site of unspecified female breast: Secondary | ICD-10-CM

## 2011-01-07 DIAGNOSIS — B370 Candidal stomatitis: Secondary | ICD-10-CM

## 2011-01-07 DIAGNOSIS — K219 Gastro-esophageal reflux disease without esophagitis: Secondary | ICD-10-CM

## 2011-01-07 LAB — COMPREHENSIVE METABOLIC PANEL
AST: 31 U/L (ref 0–37)
Albumin: 3.7 g/dL (ref 3.5–5.2)
BUN: 21 mg/dL (ref 6–23)
Calcium: 9.3 mg/dL (ref 8.4–10.5)
Chloride: 95 mEq/L — ABNORMAL LOW (ref 96–112)
Potassium: 4.2 mEq/L (ref 3.5–5.3)

## 2011-01-07 LAB — CBC WITH DIFFERENTIAL/PLATELET
Basophils Absolute: 0 10*3/uL (ref 0.0–0.1)
EOS%: 0.3 % (ref 0.0–7.0)
Eosinophils Absolute: 0 10*3/uL (ref 0.0–0.5)
HGB: 12.1 g/dL (ref 11.6–15.9)
MCH: 30.2 pg (ref 25.1–34.0)
MONO#: 0.4 10*3/uL (ref 0.1–0.9)
NEUT#: 2.9 10*3/uL (ref 1.5–6.5)
RDW: 12.9 % (ref 11.2–14.5)
WBC: 4.2 10*3/uL (ref 3.9–10.3)
lymph#: 0.9 10*3/uL (ref 0.9–3.3)

## 2011-01-14 ENCOUNTER — Encounter (HOSPITAL_BASED_OUTPATIENT_CLINIC_OR_DEPARTMENT_OTHER): Payer: PRIVATE HEALTH INSURANCE | Admitting: Oncology

## 2011-01-14 ENCOUNTER — Other Ambulatory Visit: Payer: Self-pay | Admitting: Oncology

## 2011-01-14 DIAGNOSIS — C50919 Malignant neoplasm of unspecified site of unspecified female breast: Secondary | ICD-10-CM

## 2011-01-14 LAB — CBC WITH DIFFERENTIAL/PLATELET
BASO%: 0.8 % (ref 0.0–2.0)
Basophils Absolute: 0.1 10*3/uL (ref 0.0–0.1)
EOS%: 0 % (ref 0.0–7.0)
HGB: 11.6 g/dL (ref 11.6–15.9)
MCH: 30.8 pg (ref 25.1–34.0)
MCHC: 34.4 g/dL (ref 31.5–36.0)
RDW: 13.5 % (ref 11.2–14.5)
lymph#: 1.4 10*3/uL (ref 0.9–3.3)

## 2011-01-14 LAB — COMPREHENSIVE METABOLIC PANEL
ALT: 36 U/L — ABNORMAL HIGH (ref 0–35)
AST: 27 U/L (ref 0–37)
Albumin: 3.5 g/dL (ref 3.5–5.2)
Calcium: 9.1 mg/dL (ref 8.4–10.5)
Chloride: 101 mEq/L (ref 96–112)
Potassium: 4.4 mEq/L (ref 3.5–5.3)
Sodium: 137 mEq/L (ref 135–145)
Total Protein: 6.9 g/dL (ref 6.0–8.3)

## 2011-01-21 ENCOUNTER — Other Ambulatory Visit: Payer: Self-pay | Admitting: Oncology

## 2011-01-21 ENCOUNTER — Encounter (HOSPITAL_BASED_OUTPATIENT_CLINIC_OR_DEPARTMENT_OTHER): Payer: PRIVATE HEALTH INSURANCE | Admitting: Oncology

## 2011-01-21 DIAGNOSIS — Z5111 Encounter for antineoplastic chemotherapy: Secondary | ICD-10-CM

## 2011-01-21 DIAGNOSIS — C50919 Malignant neoplasm of unspecified site of unspecified female breast: Secondary | ICD-10-CM

## 2011-01-21 LAB — COMPREHENSIVE METABOLIC PANEL
AST: 19 U/L (ref 0–37)
Albumin: 4.3 g/dL (ref 3.5–5.2)
Alkaline Phosphatase: 69 U/L (ref 39–117)
BUN: 23 mg/dL (ref 6–23)
Potassium: 4.1 mEq/L (ref 3.5–5.3)
Sodium: 138 mEq/L (ref 135–145)
Total Bilirubin: 0.3 mg/dL (ref 0.3–1.2)

## 2011-01-21 LAB — CBC WITH DIFFERENTIAL/PLATELET
BASO%: 0.1 % (ref 0.0–2.0)
EOS%: 0 % (ref 0.0–7.0)
MCH: 29.3 pg (ref 25.1–34.0)
MCHC: 33.1 g/dL (ref 31.5–36.0)
MONO%: 3.1 % (ref 0.0–14.0)
RDW: 14 % (ref 11.2–14.5)
lymph#: 0.9 10*3/uL (ref 0.9–3.3)

## 2011-01-22 ENCOUNTER — Encounter (HOSPITAL_BASED_OUTPATIENT_CLINIC_OR_DEPARTMENT_OTHER): Payer: Medicare Other | Admitting: Oncology

## 2011-01-22 DIAGNOSIS — C50919 Malignant neoplasm of unspecified site of unspecified female breast: Secondary | ICD-10-CM

## 2011-01-26 IMAGING — MG MM BREAST WIRE LOCALIZATION*L*
3 series · 3 of 3 positions shown · non-contrast
Comparison: none

CLINICAL DATA: Mass 5 o'clock left breast for lumpectomy

[L FB]
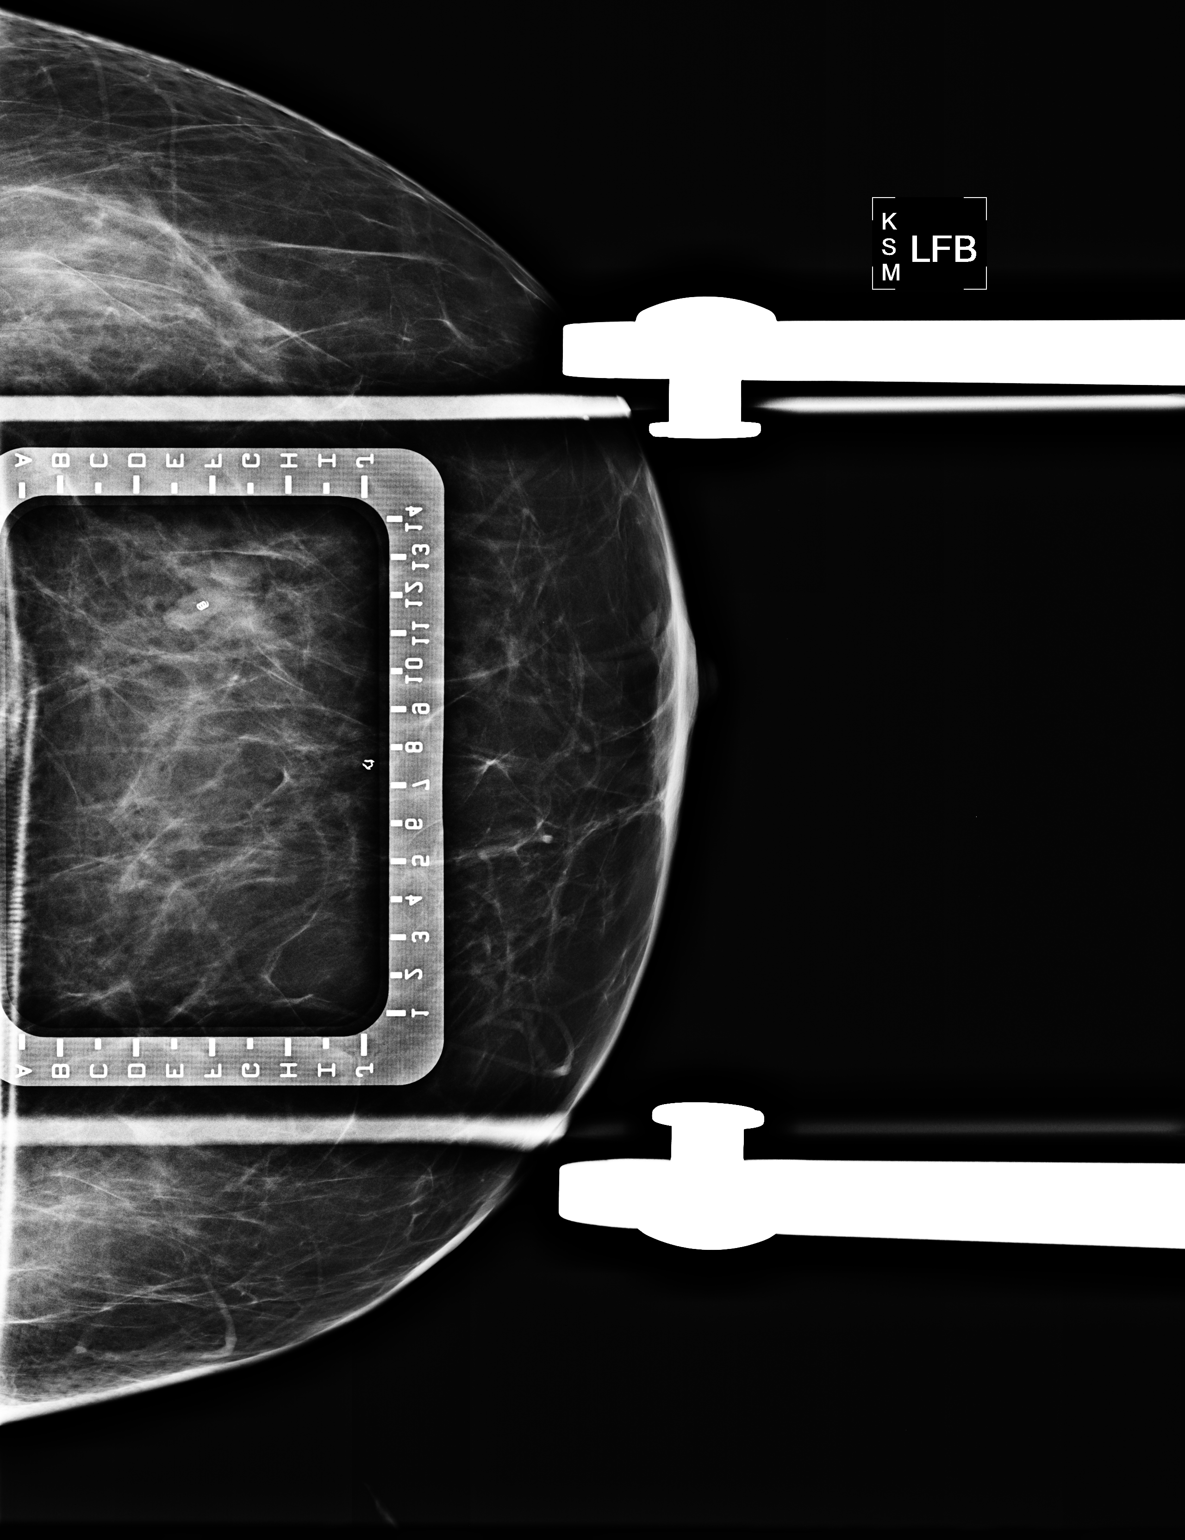

[L ML (1 of 2)]
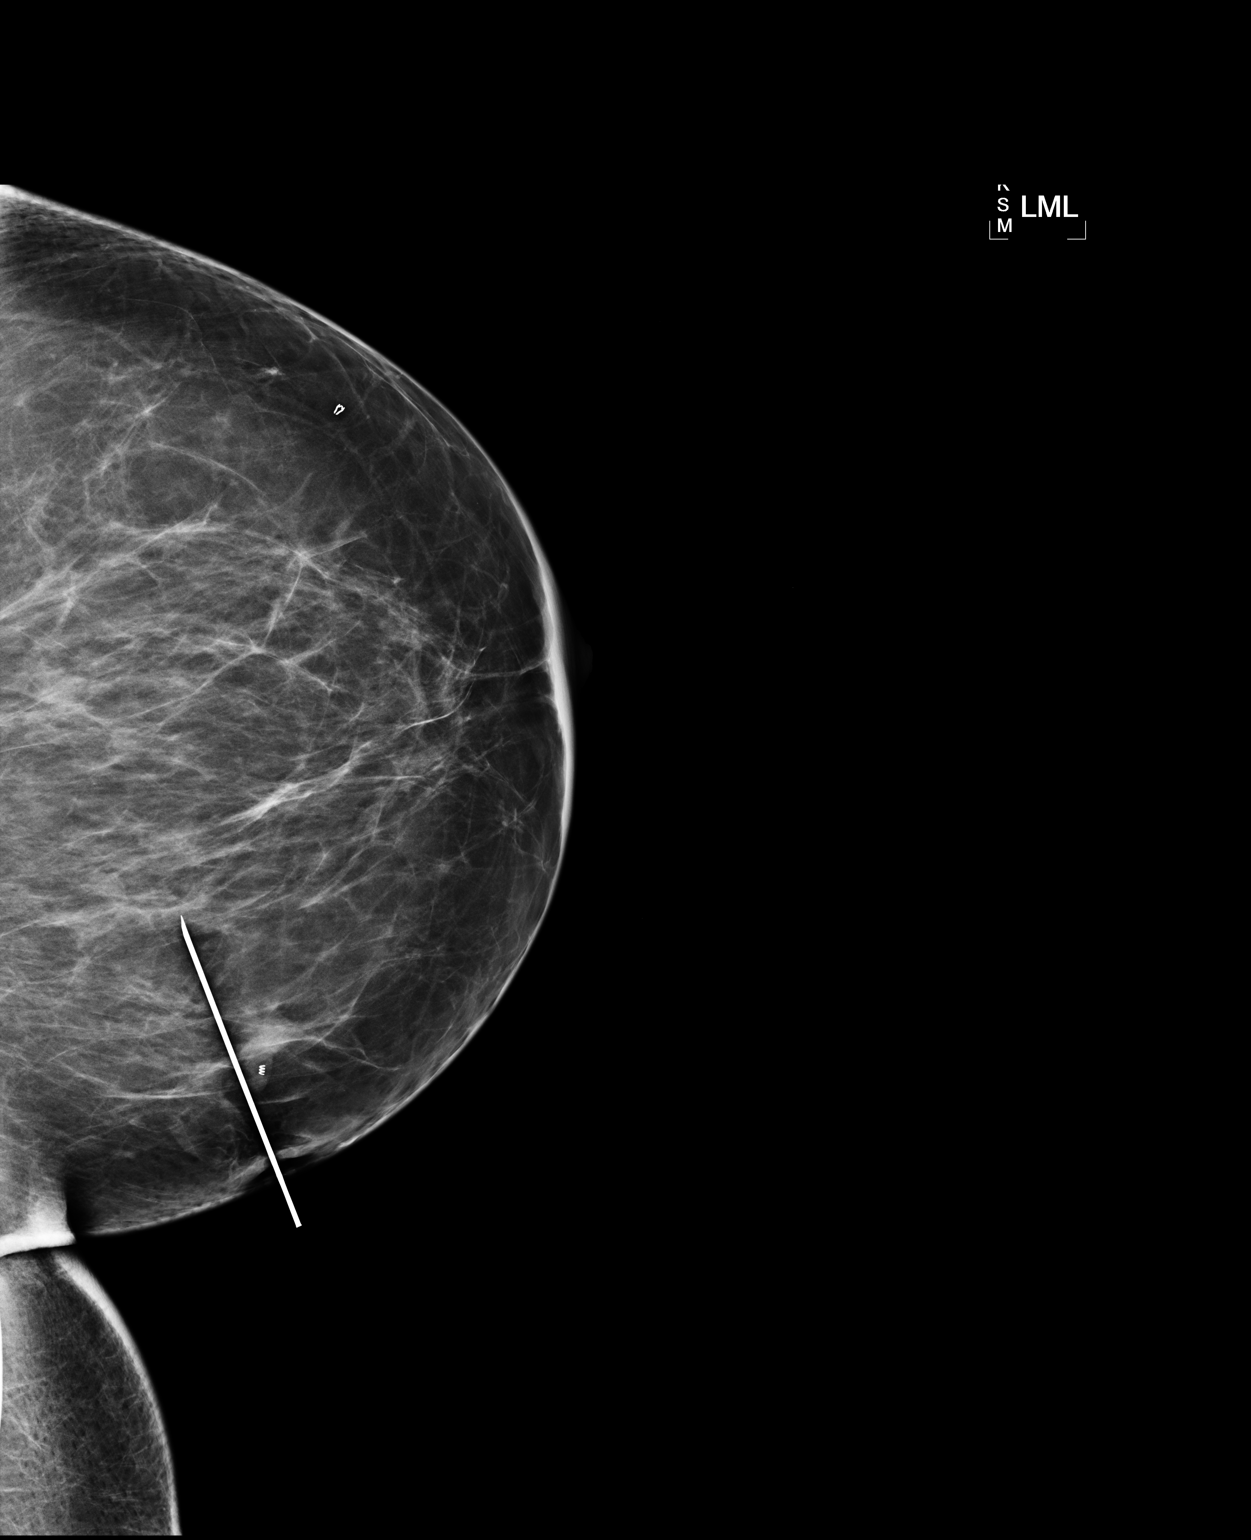

[L ML (2 of 2)]
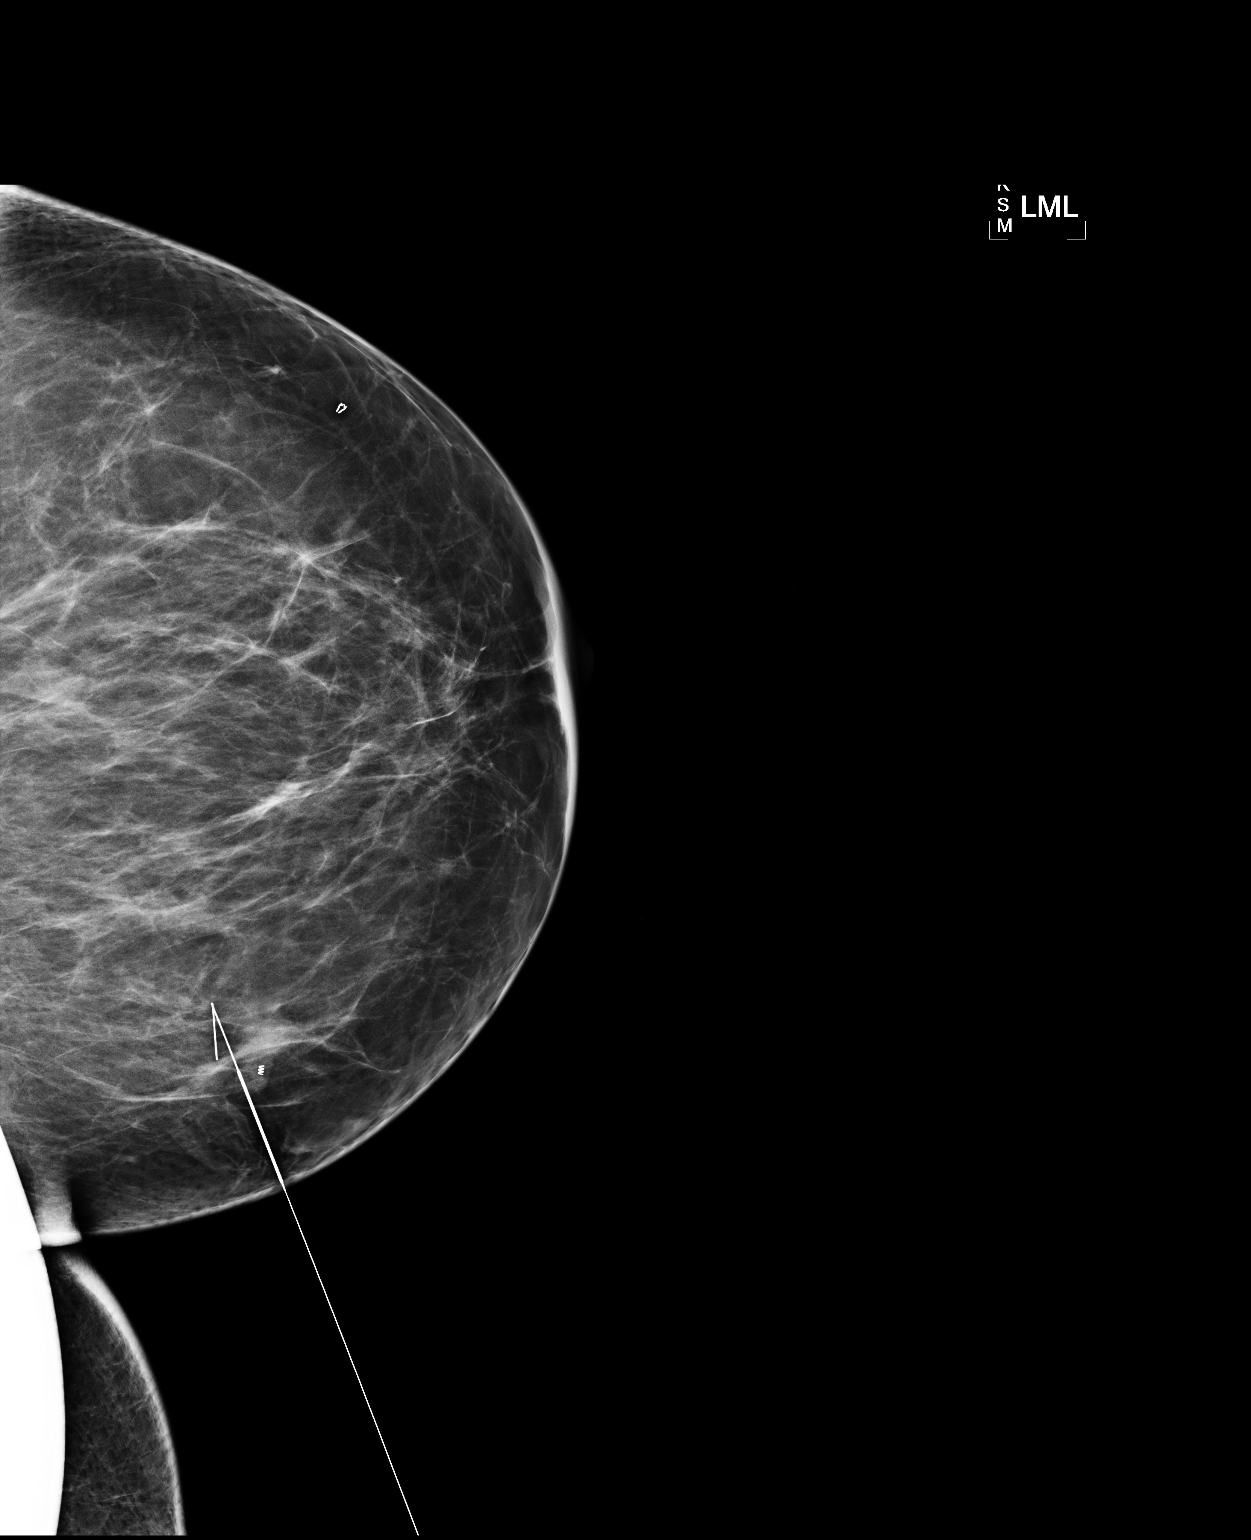

[3 of 3 positions shown; findings below may reference images not displayed]

NEEDLE LOCALIZATION WITH MAMMOGRAPHIC GUIDANCE AND SPECIMEN
RADIOGRAPH

Patient presents for needle localization prior to lumpectomy.  I
met with the patient and we discussed the procedure of needle
localization including risks, benefits, and alternatives.
Specifically, we discussed the risks of infection, bleeding, tissue
injury, and inadequate sampling. Informed written consent was
given.

Using mammographic guidance, sterile technique, 2% lidocaine and a
5 cm modified Kopans needle, the marker clip was localized using a
CC from below approach.  The films are marked for Dr. Olehmu.

Specimen radiograph was performed at day surgery, and confirms wire
and clip present in the tissue sample.  The specimen is marked for
pathology.
IMPRESSION: Needle localization left breast.  No apparent complications.

## 2011-01-28 ENCOUNTER — Encounter (HOSPITAL_BASED_OUTPATIENT_CLINIC_OR_DEPARTMENT_OTHER): Payer: Medicare Other | Admitting: Oncology

## 2011-01-28 ENCOUNTER — Other Ambulatory Visit: Payer: Self-pay | Admitting: Oncology

## 2011-01-28 DIAGNOSIS — Z170 Estrogen receptor positive status [ER+]: Secondary | ICD-10-CM

## 2011-01-28 DIAGNOSIS — C50919 Malignant neoplasm of unspecified site of unspecified female breast: Secondary | ICD-10-CM

## 2011-01-28 DIAGNOSIS — Z5111 Encounter for antineoplastic chemotherapy: Secondary | ICD-10-CM

## 2011-01-28 DIAGNOSIS — Z5189 Encounter for other specified aftercare: Secondary | ICD-10-CM

## 2011-01-28 LAB — CBC WITH DIFFERENTIAL/PLATELET
Basophils Absolute: 0 10*3/uL (ref 0.0–0.1)
EOS%: 1 % (ref 0.0–7.0)
Eosinophils Absolute: 0 10*3/uL (ref 0.0–0.5)
HGB: 11.3 g/dL — ABNORMAL LOW (ref 11.6–15.9)
LYMPH%: 27.1 % (ref 14.0–49.7)
MCH: 30.8 pg (ref 25.1–34.0)
MCV: 88.7 fL (ref 79.5–101.0)
MONO%: 38.3 % — ABNORMAL HIGH (ref 0.0–14.0)
NEUT%: 32.4 % — ABNORMAL LOW (ref 38.4–76.8)
Platelets: 113 10*3/uL — ABNORMAL LOW (ref 145–400)
RDW: 13.8 % (ref 11.2–14.5)

## 2011-02-05 ENCOUNTER — Ambulatory Visit: Payer: Medicare Other | Attending: Radiation Oncology | Admitting: Radiation Oncology

## 2011-02-05 ENCOUNTER — Other Ambulatory Visit: Payer: Self-pay | Admitting: Oncology

## 2011-02-05 DIAGNOSIS — Z510 Encounter for antineoplastic radiation therapy: Secondary | ICD-10-CM | POA: Insufficient documentation

## 2011-02-05 DIAGNOSIS — Z5111 Encounter for antineoplastic chemotherapy: Secondary | ICD-10-CM

## 2011-02-05 DIAGNOSIS — C50919 Malignant neoplasm of unspecified site of unspecified female breast: Secondary | ICD-10-CM

## 2011-02-05 DIAGNOSIS — Z809 Family history of malignant neoplasm, unspecified: Secondary | ICD-10-CM | POA: Insufficient documentation

## 2011-02-05 DIAGNOSIS — C50519 Malignant neoplasm of lower-outer quadrant of unspecified female breast: Secondary | ICD-10-CM | POA: Insufficient documentation

## 2011-02-05 DIAGNOSIS — Z5189 Encounter for other specified aftercare: Secondary | ICD-10-CM

## 2011-02-05 DIAGNOSIS — Z9079 Acquired absence of other genital organ(s): Secondary | ICD-10-CM | POA: Insufficient documentation

## 2011-02-05 DIAGNOSIS — Z800 Family history of malignant neoplasm of digestive organs: Secondary | ICD-10-CM | POA: Insufficient documentation

## 2011-02-05 DIAGNOSIS — Z90710 Acquired absence of both cervix and uterus: Secondary | ICD-10-CM | POA: Insufficient documentation

## 2011-02-05 DIAGNOSIS — I10 Essential (primary) hypertension: Secondary | ICD-10-CM | POA: Insufficient documentation

## 2011-02-05 LAB — CBC WITH DIFFERENTIAL/PLATELET
BASO%: 0.5 % (ref 0.0–2.0)
EOS%: 0.1 % (ref 0.0–7.0)
HCT: 32.4 % — ABNORMAL LOW (ref 34.8–46.6)
LYMPH%: 9.2 % — ABNORMAL LOW (ref 14.0–49.7)
MCH: 30.5 pg (ref 25.1–34.0)
MCHC: 33.8 g/dL (ref 31.5–36.0)
MCV: 90.3 fL (ref 79.5–101.0)
MONO%: 6 % (ref 0.0–14.0)
NEUT%: 84.2 % — ABNORMAL HIGH (ref 38.4–76.8)
Platelets: 141 10*3/uL — ABNORMAL LOW (ref 145–400)
RBC: 3.59 10*6/uL — ABNORMAL LOW (ref 3.70–5.45)
WBC: 14.6 10*3/uL — ABNORMAL HIGH (ref 3.9–10.3)

## 2011-02-11 ENCOUNTER — Other Ambulatory Visit: Payer: Self-pay | Admitting: Oncology

## 2011-02-11 ENCOUNTER — Encounter (HOSPITAL_BASED_OUTPATIENT_CLINIC_OR_DEPARTMENT_OTHER): Payer: Medicare Other | Admitting: Oncology

## 2011-02-11 DIAGNOSIS — C50919 Malignant neoplasm of unspecified site of unspecified female breast: Secondary | ICD-10-CM

## 2011-02-11 DIAGNOSIS — Z170 Estrogen receptor positive status [ER+]: Secondary | ICD-10-CM

## 2011-02-11 DIAGNOSIS — Z5111 Encounter for antineoplastic chemotherapy: Secondary | ICD-10-CM

## 2011-02-11 LAB — COMPREHENSIVE METABOLIC PANEL
ALT: 28 U/L (ref 0–35)
AST: 19 U/L (ref 0–37)
Albumin: 4.2 g/dL (ref 3.5–5.2)
Alkaline Phosphatase: 64 U/L (ref 39–117)
BUN: 21 mg/dL (ref 6–23)
Calcium: 9.5 mg/dL (ref 8.4–10.5)
Chloride: 105 mEq/L (ref 96–112)
Creatinine, Ser: 0.73 mg/dL (ref 0.40–1.20)
Potassium: 4.3 mEq/L (ref 3.5–5.3)

## 2011-02-11 LAB — CBC WITH DIFFERENTIAL/PLATELET
BASO%: 0.1 % (ref 0.0–2.0)
EOS%: 0 % (ref 0.0–7.0)
HCT: 28.8 % — ABNORMAL LOW (ref 34.8–46.6)
MCH: 29.7 pg (ref 25.1–34.0)
MCHC: 33.3 g/dL (ref 31.5–36.0)
MONO#: 0.3 10*3/uL (ref 0.1–0.9)
NEUT%: 89.3 % — ABNORMAL HIGH (ref 38.4–76.8)
RBC: 3.23 10*6/uL — ABNORMAL LOW (ref 3.70–5.45)
WBC: 7.7 10*3/uL (ref 3.9–10.3)
lymph#: 0.6 10*3/uL — ABNORMAL LOW (ref 0.9–3.3)
nRBC: 0 % (ref 0–0)

## 2011-02-12 ENCOUNTER — Encounter (HOSPITAL_BASED_OUTPATIENT_CLINIC_OR_DEPARTMENT_OTHER): Payer: Medicare Other | Admitting: Oncology

## 2011-02-12 DIAGNOSIS — C50919 Malignant neoplasm of unspecified site of unspecified female breast: Secondary | ICD-10-CM

## 2011-02-18 ENCOUNTER — Encounter (HOSPITAL_BASED_OUTPATIENT_CLINIC_OR_DEPARTMENT_OTHER): Payer: Medicare Other | Admitting: Oncology

## 2011-02-18 ENCOUNTER — Other Ambulatory Visit: Payer: Self-pay | Admitting: Oncology

## 2011-02-18 DIAGNOSIS — C50919 Malignant neoplasm of unspecified site of unspecified female breast: Secondary | ICD-10-CM

## 2011-02-18 DIAGNOSIS — Z170 Estrogen receptor positive status [ER+]: Secondary | ICD-10-CM

## 2011-02-18 LAB — CBC WITH DIFFERENTIAL/PLATELET
BASO%: 1.7 % (ref 0.0–2.0)
Basophils Absolute: 0 10*3/uL (ref 0.0–0.1)
EOS%: 2.3 % (ref 0.0–7.0)
HCT: 30.7 % — ABNORMAL LOW (ref 34.8–46.6)
HGB: 10 g/dL — ABNORMAL LOW (ref 11.6–15.9)
LYMPH%: 37.2 % (ref 14.0–49.7)
MCH: 29.4 pg (ref 25.1–34.0)
MCHC: 32.6 g/dL (ref 31.5–36.0)
MCV: 90.3 fL (ref 79.5–101.0)
MONO%: 39.5 % — ABNORMAL HIGH (ref 0.0–14.0)
NEUT%: 19.3 % — ABNORMAL LOW (ref 38.4–76.8)

## 2011-02-25 ENCOUNTER — Encounter: Payer: Medicare Other | Admitting: Oncology

## 2011-02-25 ENCOUNTER — Other Ambulatory Visit: Payer: Self-pay | Admitting: Oncology

## 2011-02-25 LAB — CBC WITH DIFFERENTIAL/PLATELET
BASO%: 0.4 % (ref 0.0–2.0)
Basophils Absolute: 0.1 10*3/uL (ref 0.0–0.1)
EOS%: 0.1 % (ref 0.0–7.0)
MCH: 31.4 pg (ref 25.1–34.0)
MCHC: 34.3 g/dL (ref 31.5–36.0)
MCV: 91.7 fL (ref 79.5–101.0)
MONO%: 3.8 % (ref 0.0–14.0)
RBC: 3.4 10*6/uL — ABNORMAL LOW (ref 3.70–5.45)
RDW: 17.1 % — ABNORMAL HIGH (ref 11.2–14.5)
lymph#: 1.1 10*3/uL (ref 0.9–3.3)

## 2011-03-04 ENCOUNTER — Other Ambulatory Visit: Payer: Self-pay | Admitting: Oncology

## 2011-03-04 ENCOUNTER — Encounter (HOSPITAL_BASED_OUTPATIENT_CLINIC_OR_DEPARTMENT_OTHER): Payer: Medicare Other | Admitting: Oncology

## 2011-03-04 DIAGNOSIS — C50919 Malignant neoplasm of unspecified site of unspecified female breast: Secondary | ICD-10-CM

## 2011-03-04 DIAGNOSIS — Z170 Estrogen receptor positive status [ER+]: Secondary | ICD-10-CM

## 2011-03-04 DIAGNOSIS — Z5111 Encounter for antineoplastic chemotherapy: Secondary | ICD-10-CM

## 2011-03-04 DIAGNOSIS — C50519 Malignant neoplasm of lower-outer quadrant of unspecified female breast: Secondary | ICD-10-CM

## 2011-03-04 LAB — COMPREHENSIVE METABOLIC PANEL
AST: 14 U/L (ref 0–37)
Albumin: 4.3 g/dL (ref 3.5–5.2)
Alkaline Phosphatase: 60 U/L (ref 39–117)
Chloride: 106 mEq/L (ref 96–112)
Glucose, Bld: 140 mg/dL — ABNORMAL HIGH (ref 70–99)
Potassium: 4.3 mEq/L (ref 3.5–5.3)
Sodium: 139 mEq/L (ref 135–145)
Total Protein: 6.5 g/dL (ref 6.0–8.3)

## 2011-03-04 LAB — CBC WITH DIFFERENTIAL/PLATELET
Eosinophils Absolute: 0 10*3/uL (ref 0.0–0.5)
MCV: 92.4 fL (ref 79.5–101.0)
MONO%: 0.6 % (ref 0.0–14.0)
NEUT#: 10.6 10*3/uL — ABNORMAL HIGH (ref 1.5–6.5)
RBC: 3.04 10*6/uL — ABNORMAL LOW (ref 3.70–5.45)
RDW: 18 % — ABNORMAL HIGH (ref 11.2–14.5)
WBC: 11.1 10*3/uL — ABNORMAL HIGH (ref 3.9–10.3)
lymph#: 0.5 10*3/uL — ABNORMAL LOW (ref 0.9–3.3)

## 2011-03-05 ENCOUNTER — Encounter (HOSPITAL_BASED_OUTPATIENT_CLINIC_OR_DEPARTMENT_OTHER): Payer: Medicare Other | Admitting: Oncology

## 2011-03-05 DIAGNOSIS — C50919 Malignant neoplasm of unspecified site of unspecified female breast: Secondary | ICD-10-CM

## 2011-03-11 ENCOUNTER — Encounter (HOSPITAL_BASED_OUTPATIENT_CLINIC_OR_DEPARTMENT_OTHER): Payer: Medicare Other | Admitting: Oncology

## 2011-03-11 ENCOUNTER — Other Ambulatory Visit: Payer: Self-pay | Admitting: Oncology

## 2011-03-11 DIAGNOSIS — R5382 Chronic fatigue, unspecified: Secondary | ICD-10-CM

## 2011-03-11 DIAGNOSIS — Z5111 Encounter for antineoplastic chemotherapy: Secondary | ICD-10-CM

## 2011-03-11 DIAGNOSIS — C50919 Malignant neoplasm of unspecified site of unspecified female breast: Secondary | ICD-10-CM

## 2011-03-11 DIAGNOSIS — Z5189 Encounter for other specified aftercare: Secondary | ICD-10-CM

## 2011-03-11 DIAGNOSIS — Z170 Estrogen receptor positive status [ER+]: Secondary | ICD-10-CM

## 2011-03-11 DIAGNOSIS — C50519 Malignant neoplasm of lower-outer quadrant of unspecified female breast: Secondary | ICD-10-CM

## 2011-03-11 LAB — CBC WITH DIFFERENTIAL/PLATELET
BASO%: 1.3 % (ref 0.0–2.0)
EOS%: 1.3 % (ref 0.0–7.0)
MCH: 30.3 pg (ref 25.1–34.0)
MCHC: 32.8 g/dL (ref 31.5–36.0)
MCV: 92.5 fL (ref 79.5–101.0)
MONO%: 42 % — ABNORMAL HIGH (ref 0.0–14.0)
NEUT#: 0.2 10*3/uL — CL (ref 1.5–6.5)
Platelets: 70 10*3/uL — ABNORMAL LOW (ref 145–400)
RBC: 3.33 10*6/uL — ABNORMAL LOW (ref 3.70–5.45)
RDW: 16.6 % — ABNORMAL HIGH (ref 11.2–14.5)
nRBC: 2 % — ABNORMAL HIGH (ref 0–0)

## 2011-03-12 ENCOUNTER — Encounter (INDEPENDENT_AMBULATORY_CARE_PROVIDER_SITE_OTHER): Payer: Self-pay | Admitting: General Surgery

## 2011-04-22 ENCOUNTER — Ambulatory Visit
Admission: RE | Admit: 2011-04-22 | Discharge: 2011-04-22 | Disposition: A | Discharge status: Home / Self Care | Payer: Medicare Other | Source: Ambulatory Visit | Attending: Oncology | Admitting: Oncology

## 2011-04-22 DIAGNOSIS — C50919 Malignant neoplasm of unspecified site of unspecified female breast: Secondary | ICD-10-CM

## 2011-05-06 ENCOUNTER — Ambulatory Visit
Admission: RE | Admit: 2011-05-06 | Discharge: 2011-05-06 | Disposition: A | Discharge status: Home / Self Care | Payer: Medicare Other | Source: Ambulatory Visit | Attending: Radiation Oncology | Admitting: Radiation Oncology

## 2011-05-06 DIAGNOSIS — R635 Abnormal weight gain: Secondary | ICD-10-CM | POA: Insufficient documentation

## 2011-05-06 DIAGNOSIS — Z510 Encounter for antineoplastic radiation therapy: Secondary | ICD-10-CM | POA: Insufficient documentation

## 2011-05-06 DIAGNOSIS — C50919 Malignant neoplasm of unspecified site of unspecified female breast: Secondary | ICD-10-CM | POA: Insufficient documentation

## 2011-05-07 ENCOUNTER — Other Ambulatory Visit: Payer: Self-pay | Admitting: Oncology

## 2011-05-07 ENCOUNTER — Encounter (HOSPITAL_BASED_OUTPATIENT_CLINIC_OR_DEPARTMENT_OTHER): Payer: Medicare Other | Admitting: Oncology

## 2011-05-07 DIAGNOSIS — Z5189 Encounter for other specified aftercare: Secondary | ICD-10-CM

## 2011-05-07 DIAGNOSIS — Z5111 Encounter for antineoplastic chemotherapy: Secondary | ICD-10-CM

## 2011-05-07 DIAGNOSIS — C50919 Malignant neoplasm of unspecified site of unspecified female breast: Secondary | ICD-10-CM

## 2011-05-07 DIAGNOSIS — Z170 Estrogen receptor positive status [ER+]: Secondary | ICD-10-CM

## 2011-05-07 LAB — COMPREHENSIVE METABOLIC PANEL
ALT: 22 U/L (ref 0–35)
AST: 20 U/L (ref 0–37)
Albumin: 4 g/dL (ref 3.5–5.2)
BUN: 18 mg/dL (ref 6–23)
Calcium: 9.5 mg/dL (ref 8.4–10.5)
Chloride: 105 mEq/L (ref 96–112)
Potassium: 4.3 mEq/L (ref 3.5–5.3)

## 2011-05-07 LAB — CBC WITH DIFFERENTIAL/PLATELET
Basophils Absolute: 0 10*3/uL (ref 0.0–0.1)
EOS%: 2.5 % (ref 0.0–7.0)
Eosinophils Absolute: 0.1 10*3/uL (ref 0.0–0.5)
HGB: 12.3 g/dL (ref 11.6–15.9)
MCH: 31.9 pg (ref 25.1–34.0)
MONO#: 0.3 10*3/uL (ref 0.1–0.9)
NEUT#: 2.2 10*3/uL (ref 1.5–6.5)
RDW: 13.3 % (ref 11.2–14.5)
WBC: 3 10*3/uL — ABNORMAL LOW (ref 3.9–10.3)
lymph#: 0.5 10*3/uL — ABNORMAL LOW (ref 0.9–3.3)

## 2011-05-13 ENCOUNTER — Encounter (HOSPITAL_BASED_OUTPATIENT_CLINIC_OR_DEPARTMENT_OTHER): Payer: Medicare Other | Admitting: Oncology

## 2011-05-13 ENCOUNTER — Other Ambulatory Visit: Payer: Self-pay | Admitting: Oncology

## 2011-05-13 DIAGNOSIS — Z170 Estrogen receptor positive status [ER+]: Secondary | ICD-10-CM

## 2011-05-13 DIAGNOSIS — C50519 Malignant neoplasm of lower-outer quadrant of unspecified female breast: Secondary | ICD-10-CM

## 2011-05-13 DIAGNOSIS — R5382 Chronic fatigue, unspecified: Secondary | ICD-10-CM

## 2011-05-13 DIAGNOSIS — Z853 Personal history of malignant neoplasm of breast: Secondary | ICD-10-CM

## 2011-06-18 ENCOUNTER — Encounter (INDEPENDENT_AMBULATORY_CARE_PROVIDER_SITE_OTHER): Payer: Self-pay | Admitting: General Surgery

## 2011-06-19 ENCOUNTER — Ambulatory Visit
Admission: RE | Admit: 2011-06-19 | Discharge: 2011-06-19 | Disposition: A | Discharge status: Home / Self Care | Payer: Medicare Other | Source: Ambulatory Visit | Attending: Radiation Oncology | Admitting: Radiation Oncology

## 2011-06-24 ENCOUNTER — Ambulatory Visit: Payer: Medicare Other | Attending: Radiation Oncology | Admitting: Physical Therapy

## 2011-06-24 DIAGNOSIS — IMO0001 Reserved for inherently not codable concepts without codable children: Secondary | ICD-10-CM | POA: Insufficient documentation

## 2011-06-24 DIAGNOSIS — R5381 Other malaise: Secondary | ICD-10-CM | POA: Insufficient documentation

## 2011-06-24 DIAGNOSIS — M25619 Stiffness of unspecified shoulder, not elsewhere classified: Secondary | ICD-10-CM | POA: Insufficient documentation

## 2011-07-10 ENCOUNTER — Ambulatory Visit (INDEPENDENT_AMBULATORY_CARE_PROVIDER_SITE_OTHER): Payer: Medicare Other | Admitting: Family Medicine

## 2011-07-10 DIAGNOSIS — Z23 Encounter for immunization: Secondary | ICD-10-CM

## 2011-07-22 ENCOUNTER — Ambulatory Visit (INDEPENDENT_AMBULATORY_CARE_PROVIDER_SITE_OTHER): Payer: Medicare Other | Admitting: General Surgery

## 2011-07-22 ENCOUNTER — Encounter (INDEPENDENT_AMBULATORY_CARE_PROVIDER_SITE_OTHER): Payer: Self-pay | Admitting: General Surgery

## 2011-07-22 VITALS — BP 124/70 | HR 60 | Temp 97.0°F | Resp 16 | Ht 61.0 in | Wt 177.6 lb

## 2011-07-22 DIAGNOSIS — C50519 Malignant neoplasm of lower-outer quadrant of unspecified female breast: Secondary | ICD-10-CM | POA: Insufficient documentation

## 2011-07-22 NOTE — Patient Instructions (Signed)
Your exam today is normal. There is no clinical evidence of recurrent cancer. We will schedule you for Port-A-Cath removal in November, after you get your annual mammograms. I will see you back in one year.

## 2011-07-22 NOTE — Progress Notes (Signed)
Chief Complaint  Patient presents with  . 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 female. This patient underwent left partial mastectomy and sentinel node biopsy on October 23, 2010. The tumor was a pathologic stage TIc., N1, receptor positive, HER-2-negative, Ki-67 44%. A Port-A-Cath was placed on December 20, 2010. She had adjuvant chemotherapy which was completed in May of 2012.  She states she has not had the Port-A-Cath flushed since then. She states Dr. Darnelle Catalan told her she could have it removed.  She completed radiation therapy on or about May 07, 2011.  She is scheduled for mammograms on August 18, 2011 which will be her annual mammograms.  She has no complaint about her breasts. She is pleased with  the cosmetic result. She feels no abnormalities other than the scar. She has no arm symptoms. HPI  Past Medical History  Diagnosis Date  . Hypertension   . Cancer   . Heart murmur   . Hyperlipidemia     Past Surgical History  Procedure Date  . Abdominal hysterectomy 1987  . Breast lumpectomy 10/23/10    left    Family History  Problem Relation Age of Onset  . COPD Father   . Cancer Brother     jaw    Social History History  Substance Use Topics  . Smoking status: Never Smoker   . Smokeless tobacco: Not on file  . Alcohol Use: No    No Known Allergies  Current Outpatient Prescriptions  Medication Sig Dispense Refill  . CALCIUM PO Take by mouth.        . Cholecalciferol (D-3-5 PO) Take by mouth.        Marland Kitchen lisinopril-hydrochlorothiazide (PRINZIDE,ZESTORETIC) 20-12.5 MG per tablet Take 1 tablet by mouth daily.        Marland Kitchen MAGNESIUM PO Take by mouth daily.        . Multiple Vitamin (MULTIVITAMIN) tablet Take 1 tablet by mouth daily.        . tamoxifen (NOLVADEX) 20 MG tablet daily.      . timolol (TIMOPTIC) 0.5 % ophthalmic solution BID times 48H.        Review of  Systems Review of Systems 12 system review of systems is performed and is negative except as described above.Blood pressure 124/70, pulse 60, temperature 97 F (36.1 C), temperature source Temporal, resp. rate 16, height 5\' 1"  (1.549 m), weight 177 lb 9.6 oz (80.559 kg).  Physical Exam Physical Exam Constitutional patient looks well and is in no distress  Neck no adenopathy, no mass, no jugular venous distention.  Lungs clear to auscultation. Port in the right infraclavicular area looks fine. The catheter goes up over the clavicle into the internal jugular vein.  Heart regular rate and rhythm the murmur.  Moderately large. Well-healed scar in the left breast at the five o'clock position. No palpable mass. No other skin changes. No axillary adenopathy. Contour, and nipple projection are good. Minimal volume loss Data Reviewed I reviewed the notes from the consulting physicians. I reviewed my old records.  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  No evidence of recurrence 10 months following left partial mastectomy left sentinel node biopsy.  Status post adjuvant chemotherapy and radiation therapy.  Desires Port-A-Cath removal.   Plan    She will get mammograms in  November 2012  If the mammograms are OK, we will schedule for removal of the Port-A-Cath. She desires this be done under local anesthesia. I think that is reasonable.  Otherwise I will see her back in one year after her annual mammograms.       Briana Smith M 07/22/2011, 4:50 PM

## 2011-08-14 ENCOUNTER — Telehealth: Payer: Self-pay | Admitting: Radiation Oncology

## 2011-08-14 ENCOUNTER — Telehealth: Payer: Self-pay | Admitting: *Deleted

## 2011-08-14 NOTE — Telephone Encounter (Signed)
Patient confirmed over the phone the new date and time of the appointment with amy berry in 10-2011

## 2011-08-14 NOTE — Telephone Encounter (Signed)
Patient called requesting itemized bills for [facility] policy 02/05/11-05/16/11

## 2011-08-18 ENCOUNTER — Ambulatory Visit
Admission: RE | Admit: 2011-08-18 | Discharge: 2011-08-18 | Disposition: A | Discharge status: Home / Self Care | Payer: Medicare Other | Source: Ambulatory Visit | Attending: Oncology | Admitting: Oncology

## 2011-08-18 DIAGNOSIS — Z853 Personal history of malignant neoplasm of breast: Secondary | ICD-10-CM

## 2011-08-20 ENCOUNTER — Other Ambulatory Visit (INDEPENDENT_AMBULATORY_CARE_PROVIDER_SITE_OTHER): Payer: Self-pay

## 2011-08-20 ENCOUNTER — Other Ambulatory Visit (INDEPENDENT_AMBULATORY_CARE_PROVIDER_SITE_OTHER): Payer: Self-pay | Admitting: General Surgery

## 2011-09-01 ENCOUNTER — Other Ambulatory Visit (INDEPENDENT_AMBULATORY_CARE_PROVIDER_SITE_OTHER): Payer: Self-pay | Admitting: General Surgery

## 2011-09-01 NOTE — H&P (Signed)
  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 female. This patient underwent left partial mastectomy and sentinel node biopsy on October 23, 2010. The tumor was a pathologic stage TIc., N1, receptor positive, HER-2-negative, Ki-67 44%. A Port-A-Cath was placed on December 20, 2010. She had adjuvant chemotherapy which was completed in May of 2012.   She states she has not had the Port-A-Cath flushed since then. She states Dr. Magrinat told her she could have it removed.   She completed radiation therapy on or about May 07, 2011.   She is scheduled for mammograms on August 18, 2011 which will be her annual mammograms.   She has no complaint about her breasts. She is pleased with  the cosmetic result. She feels no abnormalities other than the scar. She has no arm symptoms. HPI    Past Medical History   Diagnosis  Date   .  Hypertension     .  Cancer     .  Heart murmur     .  Hyperlipidemia         Past Surgical History   Procedure  Date   .  Abdominal hysterectomy  1987   .  Breast lumpectomy  10/23/10       left       Family History     Problem  Relation  Age of Onset   .  COPD  Father     .  Cancer  Brother         jaw      Social History History   Substance Use Topics   .  Smoking status:  Never Smoker    .  Smokeless tobacco:  Not on file   .  Alcohol Use:  No      No Known Allergies    Current Outpatient Prescriptions   Medication  Sig  Dispense  Refill   .  CALCIUM PO  Take by mouth.           .  Cholecalciferol (D-3-5 PO)  Take by mouth.           .    lisinopril-hydrochlorothiazide (PRINZIDE,ZESTORETIC) 20-12.5 MG per tablet  Take 1 tablet by mouth daily.           .  MAGNESIUM PO  Take by mouth daily.           .  Multiple Vitamin (MULTIVITAMIN) tablet  Take 1 tablet by mouth daily.           .  tamoxifen (NOLVADEX) 20 MG tablet  daily.         .  timolol (TIMOPTIC) 0.5 % ophthalmic solution  BID times 48H.            Review of Systems Review of Systems 12 system review of systems is performed and is negative except as described above.Blood pressure 124/70, pulse 60, temperature 97 F (36.1 C), temperature source Temporal, resp. rate 16, height 5' 1" (1.549 m), weight 177 lb 9.6 oz (80.559 kg).   Physical Exam Physical Exam Constitutional patient looks well and is in no distress   Neck no adenopathy, no mass, no jugular venous distention.   Lungs clear to auscultation. Port in the right infraclavicular area looks fine. The catheter goes up over the clavicle into the internal jugular vein.   Heart regular rate and rhythm the murmur.   Moderately large. Well-healed scar in the left breast at the five o'clock position. No palpable mass. No other skin changes. No axillary adenopathy. Contour, and nipple projection are good. Minimal volume loss Data Reviewed I reviewed the notes from the consulting physicians. I reviewed my old records.   Assessment   Invasive carcinoma left breast, pathologic stage TIc., N1a., receptor positive, HER-2 negative, Ki67 44%.   No evidence of recurrence 10  months following left partial mastectomy left sentinel node biopsy.   Status post adjuvant chemotherapy and radiation therapy.   Desires Port-A-Cath removal.    Plan She will get mammograms in November 2012   If the mammograms are OK, we will schedule for removal of the Port-A-Cath. She desires this be done under local anesthesia. I think that is reasonable.   Otherwise I will see her back in one year after her annual mammograms.       Ercel Pepitone M 07/22/2011, 4:50 PM                Not recorded       Discontinued Medications         Reason for Discontinue    HYDROCHLOROTHIAZIDE PO Error           Patient Instructions     Your exam today is normal. There is no clinical evidence of recurrent cancer. We will schedule you for Port-A-Cath removal in November, after you get your annual mammograms. I will see you back in one year.       Level of Service     PR OFFICE/OUTPT VISIT,EST,LEVL II [99212]      Follow-up and Disposition     Return in about 1 year (around 07/21/2012).       Follow-up and Disposition History Recorded        All Flowsheet Templates (all recorded)     Encounter Vitals Flowsheet    Custom Formula Data Flowsheet    Anthropometrics Flowsheet               Referring Provider          Jeffrey Allen Todd       All Charges for This Encounter       Code Description Service Date Service   Provider Modifiers Quantity    99212 PR OFFICE/OUTPT VISIT,EST,LEVL II 07/22/2011 Lakyla Biswas M Pretty Weltman, MD   1        Other Encounter Related Information     Allergies & Medications         Problem List         History         Patient-Entered Questionnaires     No data filed         

## 2011-09-02 ENCOUNTER — Ambulatory Visit (HOSPITAL_BASED_OUTPATIENT_CLINIC_OR_DEPARTMENT_OTHER)
Admission: RE | Admit: 2011-09-02 | Discharge: 2011-09-02 | Disposition: A | Discharge status: Home / Self Care | Payer: Medicare Other | Source: Ambulatory Visit | Attending: General Surgery | Admitting: General Surgery

## 2011-09-02 ENCOUNTER — Encounter (HOSPITAL_BASED_OUTPATIENT_CLINIC_OR_DEPARTMENT_OTHER)
Admission: RE | Disposition: A | Discharge status: Home / Self Care | Payer: Self-pay | Source: Ambulatory Visit | Attending: General Surgery

## 2011-09-02 DIAGNOSIS — C773 Secondary and unspecified malignant neoplasm of axilla and upper limb lymph nodes: Secondary | ICD-10-CM | POA: Insufficient documentation

## 2011-09-02 DIAGNOSIS — Z452 Encounter for adjustment and management of vascular access device: Secondary | ICD-10-CM | POA: Insufficient documentation

## 2011-09-02 DIAGNOSIS — C50519 Malignant neoplasm of lower-outer quadrant of unspecified female breast: Secondary | ICD-10-CM | POA: Insufficient documentation

## 2011-09-02 HISTORY — PX: PORT-A-CATH REMOVAL: SHX5289

## 2011-09-02 SURGERY — REMOVAL PORT-A-CATH
Anesthesia: LOCAL | Site: Chest | Laterality: Right | Wound class: Clean

## 2011-09-02 MED ORDER — LIDOCAINE-EPINEPHRINE (PF) 1 %-1:200000 IJ SOLN: INTRAMUSCULAR | Status: DC | PRN

## 2011-09-02 MED ORDER — SODIUM BICARBONATE 4 % IV SOLN
INTRAVENOUS | Status: DC | PRN
  Administered 2011-09-02 (×2): via SUBCUTANEOUS

## 2011-09-02 SURGICAL SUPPLY — 36 items
ADH SKN CLS APL DERMABOND .7 (GAUZE/BANDAGES/DRESSINGS) ×1
BENZOIN TINCTURE PRP APPL 2/3 (GAUZE/BANDAGES/DRESSINGS) IMPLANT
BLADE HEX COATED 2.75 (ELECTRODE) IMPLANT
BLADE SURG 15 STRL LF DISP TIS (BLADE) ×1 IMPLANT
BLADE SURG 15 STRL SS (BLADE) ×2
CANISTER SUCTION 1200CC (MISCELLANEOUS) IMPLANT
CHLORAPREP W/TINT 26ML (MISCELLANEOUS) ×2 IMPLANT
CLOTH BEACON ORANGE TIMEOUT ST (SAFETY) ×2 IMPLANT
COVER MAYO STAND STRL (DRAPES) IMPLANT
COVER TABLE BACK 60X90 (DRAPES) IMPLANT
DECANTER SPIKE VIAL GLASS SM (MISCELLANEOUS) IMPLANT
DERMABOND ADVANCED (GAUZE/BANDAGES/DRESSINGS) ×1
DERMABOND ADVANCED .7 DNX12 (GAUZE/BANDAGES/DRESSINGS) ×1 IMPLANT
DRAPE PED LAPAROTOMY (DRAPES) IMPLANT
DRAPE UTILITY XL STRL (DRAPES) IMPLANT
DRSG TEGADERM 4X4.75 (GAUZE/BANDAGES/DRESSINGS) IMPLANT
ELECT REM PT RETURN 9FT ADLT (ELECTROSURGICAL)
ELECTRODE REM PT RTRN 9FT ADLT (ELECTROSURGICAL) IMPLANT
GAUZE SPONGE 4X4 12PLY STRL LF (GAUZE/BANDAGES/DRESSINGS) ×2 IMPLANT
GLOVE EUDERMIC 7 POWDERFREE (GLOVE) ×4 IMPLANT
GOWN PREVENTION PLUS XLARGE (GOWN DISPOSABLE) IMPLANT
GOWN PREVENTION PLUS XXLARGE (GOWN DISPOSABLE) IMPLANT
NEEDLE HYPO 25X1 1.5 SAFETY (NEEDLE) ×2 IMPLANT
PACK BASIN DAY SURGERY FS (CUSTOM PROCEDURE TRAY) IMPLANT
PENCIL BUTTON HOLSTER BLD 10FT (ELECTRODE) IMPLANT
SLEEVE SCD COMPRESS KNEE MED (MISCELLANEOUS) IMPLANT
STRIP CLOSURE SKIN 1/2X4 (GAUZE/BANDAGES/DRESSINGS) IMPLANT
SUT MON AB 4-0 PC3 18 (SUTURE) ×2 IMPLANT
SUT VICRYL 3-0 CR8 SH (SUTURE) ×2 IMPLANT
SYR CONTROL 10ML LL (SYRINGE) ×2 IMPLANT
TOWEL OR 17X24 6PK STRL BLUE (TOWEL DISPOSABLE) ×2 IMPLANT
TOWEL OR NON WOVEN STRL DISP B (DISPOSABLE) ×2 IMPLANT
TRAY DSU PREP LF (CUSTOM PROCEDURE TRAY) IMPLANT
TUBE CONNECTING 20X1/4 (TUBING) IMPLANT
WATER STERILE IRR 1000ML POUR (IV SOLUTION) IMPLANT
YANKAUER SUCT BULB TIP NO VENT (SUCTIONS) IMPLANT

## 2011-09-02 NOTE — Op Note (Signed)
Patient Name:           Briana Smith   Date of Surgery:        09/02/2011  Pre op Diagnosis:      Invasive cancer left breast, and desires removal of Port-A-Cath.  Post op Diagnosis:    Invasive cancer left breast, and desires removal of Port-A-Cath  Procedure:                 Removal of Port-A-Cath  Surgeon:                     [name]. Derrell Lolling, M.D., FACS  Assistant:                        Operative Indications:   This is a 70 year old Caucasian female who underwent left partial mastectomy and sentinel node biopsy on 10/23/2010. She had a pathologic stage TIc, N1.  , receptor positive, HER-2-negative cancer. Port-A-Cath was placed on 12/20/2010. She has completed her adjuvant chemotherapy and she desires removal of her Port-A-Cath. She has been counseled as an outpatient and is brought to the operating room electively.  Operative Findings:         Procedure in Detail:          The patient was brought to operating room #8 at [facility]. The procedure was done under local anesthesia. Surgical timeout was held. The patient's right upper chest and neck were prepped and draped in a sterile fashion. 1% Xylocaine with epinephrine was used as a local infiltration anesthetic. A transverse incision was made in the right infraclavicular area, through the previous scar. Dissection was carried down through subcutaneous tissue. The capsule around the port was incised. The port was dissected out of its capsule and the three Prolene sutures were cut and removed. The port and the catheter were then removed uneventfully. There was no bleeding. Subcutaneous tissue was closed with interrupted sutures of 3-0 Vicryl and the skin closed with a running subcuticular suture of 4-0 Monocryl and Dermabond.  The patient tolerated the procedure well. Counts were correct. EBL 10 cc. The patient was returned to the holding area in excellent condition.     [name]. Derrell Lolling, M.D., FACS General and  Minimally Invasive Surgery Breast and Colorectal Surgery  09/02/2011 8:01 AM

## 2011-09-02 NOTE — H&P (View-Only) (Signed)
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 female. This patient underwent left partial mastectomy and sentinel node biopsy on October 23, 2010. The tumor was a pathologic stage TIc., N1, receptor positive, HER-2-negative, Ki-67 44%. A Port-A-Cath was placed on December 20, 2010. She had adjuvant chemotherapy which was completed in May of 2012.   She states she has not had the Port-A-Cath flushed since then. She states Dr. Darnelle Catalan told her she could have it removed.   She completed radiation therapy on or about May 07, 2011.   She is scheduled for mammograms on August 18, 2011 which will be her annual mammograms.   She has no complaint about her breasts. She is pleased with  the cosmetic result. She feels no abnormalities other than the scar. She has no arm symptoms. HPI    Past Medical History   Diagnosis  Date   .  Hypertension     .  Cancer     .  Heart murmur     .  Hyperlipidemia         Past Surgical History   Procedure  Date   .  Abdominal hysterectomy  1987   .  Breast lumpectomy  10/23/10       left       Family History     Problem  Relation  Age of Onset   .  COPD  Father     .  Cancer  Brother         jaw      Social History History   Substance Use Topics   .  Smoking status:  Never Smoker    .  Smokeless tobacco:  Not on file   .  Alcohol Use:  No      No Known Allergies    Current Outpatient Prescriptions   Medication  Sig  Dispense  Refill   .  CALCIUM PO  Take by mouth.           .  Cholecalciferol (D-3-5 PO)  Take by mouth.           Marland Kitchen  lisinopril-hydrochlorothiazide (PRINZIDE,ZESTORETIC) 20-12.5 MG per tablet  Take 1 tablet by mouth daily.           Marland Kitchen  MAGNESIUM PO  Take by mouth daily.           .  Multiple Vitamin (MULTIVITAMIN) tablet  Take 1 tablet by mouth daily.           .  tamoxifen (NOLVADEX) 20 MG tablet  daily.         .  timolol (TIMOPTIC) 0.5 % ophthalmic solution  BID times 48H.            Review of Systems Review of Systems 12 system review of systems is performed and is negative except as described above.Blood pressure 124/70, pulse 60, temperature 97 F (36.1 C), temperature source Temporal, resp. rate 16, height 5\' 1"  (1.549 m), weight 177 lb 9.6 oz (80.559 kg).   Physical Exam Physical Exam Constitutional patient looks well and is in no distress   Neck no adenopathy, no mass, no jugular venous distention.   Lungs clear to auscultation. Port in the right infraclavicular area looks fine. The catheter goes up over the clavicle into the internal jugular vein.   Heart regular rate and rhythm the murmur.   Moderately large. Well-healed scar in the left breast at the five o'clock position. No palpable mass. No other skin changes. No axillary adenopathy. Contour, and nipple projection are good. Minimal volume loss Data Reviewed I reviewed the notes from the consulting physicians. I reviewed my old records.   Assessment   Invasive carcinoma left breast, pathologic stage TIc., N1a., receptor positive, HER-2 negative, Ki67 44%.   No evidence of recurrence 10  months following left partial mastectomy left sentinel node biopsy.   Status post adjuvant chemotherapy and radiation therapy.   Desires Port-A-Cath removal.    Plan She will get mammograms in November 2012   If the mammograms are OK, we will schedule for removal of the Port-A-Cath. She desires this be done under local anesthesia. I think that is reasonable.   Otherwise I will see her back in one year after her annual mammograms.       Caydence Koenig M 07/22/2011, 4:50 PM                Not recorded       Discontinued Medications         Reason for Discontinue    HYDROCHLOROTHIAZIDE PO Error           Patient Instructions     Your exam today is normal. There is no clinical evidence of recurrent cancer. We will schedule you for Port-A-Cath removal in November, after you get your annual mammograms. I will see you back in one year.       Level of Service     PR OFFICE/OUTPT VISIT,EST,LEVL II [id number]      Follow-up and Disposition     Return in about 1 year (around 07/21/2012).       Follow-up and Disposition History Recorded        All Flowsheet Templates (all recorded)     Encounter Vitals Flowsheet    Custom Formula Data Flowsheet    Anthropometrics Flowsheet               Referring Provider          Evette Georges       All Charges for This Encounter       Code Description Service Date Service  Provider Modifiers Quantity    [phone] PR OFFICE/OUTPT Emily Filbert 07/22/2011 Ernestene Mention, MD   1        Other Encounter Related Information     Allergies & Medications         Problem List         History         Patient-Entered Questionnaires     No data filed

## 2011-09-02 NOTE — Interval H&P Note (Signed)
History and Physical Interval Note:   09/02/2011   6:59 AM   Briana Smith  has presented today for surgery, with the diagnosis of un-needed port a cath  The various methods of treatment have been discussed with the patient and family. After consideration of risks, benefits and other options for treatment, the patient has consented to  Procedure(s): REMOVAL PORT-A-CATH as a surgical intervention .  The patients' history has been reviewed today, patient examined today, there is no change in status, she is stable for surgery.  I have reviewed the patients' chart and labs.  Questions were answered to the patient's satisfaction. She agrees with the planned procedure.    Ernestene Mention  MD 09/02/2011 7:00 AM

## 2011-09-04 ENCOUNTER — Encounter (HOSPITAL_BASED_OUTPATIENT_CLINIC_OR_DEPARTMENT_OTHER): Payer: Self-pay | Admitting: General Surgery

## 2011-09-08 ENCOUNTER — Encounter: Payer: Self-pay | Admitting: Family Medicine

## 2011-09-08 ENCOUNTER — Ambulatory Visit (INDEPENDENT_AMBULATORY_CARE_PROVIDER_SITE_OTHER): Payer: Medicare Other | Admitting: Family Medicine

## 2011-09-08 DIAGNOSIS — C50519 Malignant neoplasm of lower-outer quadrant of unspecified female breast: Secondary | ICD-10-CM

## 2011-09-08 DIAGNOSIS — Z0000 Encounter for general adult medical examination without abnormal findings: Secondary | ICD-10-CM

## 2011-09-08 DIAGNOSIS — I10 Essential (primary) hypertension: Secondary | ICD-10-CM

## 2011-09-08 DIAGNOSIS — Z85828 Personal history of other malignant neoplasm of skin: Secondary | ICD-10-CM

## 2011-09-08 LAB — POCT URINALYSIS DIPSTICK
Ketones, UA: NEGATIVE
pH, UA: 6.5

## 2011-09-08 LAB — CBC WITH DIFFERENTIAL/PLATELET
Basophils Absolute: 0 10*3/uL (ref 0.0–0.1)
Eosinophils Relative: 1.7 % (ref 0.0–5.0)
HCT: 36.6 % (ref 36.0–46.0)
Lymphs Abs: 0.7 10*3/uL (ref 0.7–4.0)
MCV: 94.1 fl (ref 78.0–100.0)
Monocytes Absolute: 0.3 10*3/uL (ref 0.1–1.0)
Platelets: 154 10*3/uL (ref 150.0–400.0)
RDW: 14.8 % — ABNORMAL HIGH (ref 11.5–14.6)

## 2011-09-08 LAB — BASIC METABOLIC PANEL
BUN: 18 mg/dL (ref 6–23)
Chloride: 104 mEq/L (ref 96–112)
Creatinine, Ser: 0.8 mg/dL (ref 0.4–1.2)
Glucose, Bld: 92 mg/dL (ref 70–99)

## 2011-09-08 LAB — LIPID PANEL: Total CHOL/HDL Ratio: 3

## 2011-09-08 LAB — HEPATIC FUNCTION PANEL
AST: 26 U/L (ref 0–37)
Alkaline Phosphatase: 57 U/L (ref 39–117)
Total Bilirubin: 0.6 mg/dL (ref 0.3–1.2)

## 2011-09-08 MED ORDER — LISINOPRIL-HYDROCHLOROTHIAZIDE 20-12.5 MG PO TABS: ORAL_TABLET | ORAL | Status: DC

## 2011-09-08 NOTE — Progress Notes (Signed)
Subjective:    Patient ID: Briana Smith, female    DOB: April 09, 1941, 70 y.o.   MRN: [id number]  HPI Briana Smith is a delightful, 70 year old, married female, nonsmoker, who comes in today for Medicare wellness examination because of a history of breast cancer, hypertension, skin cancer.  She had a lesion that was picked up last year on mammogram.  Subsequent surgery and postop radiation and chemo.  Port were removed recently.  Doing well.  No complications except she is not checking her breasts on a monthly basis.  Encouraged to do BSE monthly.  Her blood pressure on lisinopril, HCTZ, 20 -- 4.5 daily is 108/70.  Will decrease BP medication, and follow-up in 4 weeks.  She is on eyedrops from her ophthalmologist because of glaucoma.  She is on tamoxifen from her oncologist because of breast cancer.  She also takes calcium and vitamin D, and walks on a regular basis.  She gets routine eye care, hearing diminished, but she declines an audiogram, regular dental care, does not do BSE monthly, does get annual mammography, recent colonoscopy 3 years ago, normal, tetanus, 2003, flu shot 2012, Pneumovax 2007, shingles 2010.  Cognitive function, normal.  She is married, nonsmoker, lives with her husband walks on a daily basis.  Now.  Activities of daily living.  Normal, calm L. Safety reviewed.  No issues identified, no guns in the house, she does have a healthcare power of attorney and living will.  She does have a history of skin cancer.  Will also do a thorough skin exam   Review of Systems  Constitutional: Negative.   HENT: Negative.   Eyes: Negative.   Respiratory: Negative.   Cardiovascular: Negative.   Gastrointestinal: Negative.   Genitourinary: Negative.   Musculoskeletal: Negative.   Neurological: Negative.   Hematological: Negative.   Psychiatric/Behavioral: Negative.        Objective:   Physical Exam  Constitutional: She appears well-developed and well-nourished.  HENT:    Head: Normocephalic and atraumatic.  Right Ear: External ear normal.  Left Ear: External ear normal.  Nose: Nose normal.  Mouth/Throat: Oropharynx is clear and moist.  Eyes: EOM are normal. Pupils are equal, round, and reactive to light.  Neck: Normal range of motion. Neck supple. No thyromegaly present.  Cardiovascular: Normal rate, regular rhythm, normal heart sounds and intact distal pulses.  Exam reveals no gallop and no friction rub.   No murmur heard. Pulmonary/Chest: Effort normal and breath sounds normal.  Abdominal: Soft. Bowel sounds are normal. She exhibits no distension and no mass. There is no tenderness. There is no rebound.  Genitourinary:       Pelvic examination not done too tender uterus and ovaries removed, and no issues related to her vagina.  Bilateral breast exam shows the right breast to be normal.  Left breast scar from previous surgery.  Also, tattoos.  Thickening left breast radiation.  Port right upper anterior chest wall removed.  A week ago, healing well.  Musculoskeletal: Normal range of motion.  Lymphadenopathy:    She has no cervical adenopathy.  Neurological: She is alert. She has normal reflexes. No cranial nerve deficit. She exhibits normal muscle tone. Coordination normal.  Skin: Skin is warm and dry.  Psychiatric: She has a normal mood and affect. Her behavior is normal. Judgment and thought content normal.          Assessment & Plan:  Healthy female.  Hypertension with blood pressure too low.  Plan decrease medication by 50%.  Follow-up in one month.  Left-sided breast cancer continue oncology follow-up.  Recommend BSE monthly.  Glaucoma.  Continue eye drops followed by ophthalmologist.  History of skin cancer, total body skin exam shows no abnormal appearing lesions.

## 2011-09-08 NOTE — Patient Instructions (Signed)
Continue E. Her daily walks.  Take an aspirin tablet daily.  Decrease your blood pressure medication to one half tab daily in the morning, BP checked daily in the morning.  Return in 4 weeks for follow-up

## 2011-09-09 LAB — LDL CHOLESTEROL, DIRECT: Direct LDL: 126.5 mg/dL

## 2011-10-09 ENCOUNTER — Other Ambulatory Visit: Payer: Self-pay | Admitting: Oncology

## 2011-10-09 ENCOUNTER — Other Ambulatory Visit (HOSPITAL_BASED_OUTPATIENT_CLINIC_OR_DEPARTMENT_OTHER): Payer: Medicare Other | Admitting: Lab

## 2011-10-09 DIAGNOSIS — Z170 Estrogen receptor positive status [ER+]: Secondary | ICD-10-CM

## 2011-10-09 DIAGNOSIS — C50519 Malignant neoplasm of lower-outer quadrant of unspecified female breast: Secondary | ICD-10-CM

## 2011-10-09 DIAGNOSIS — R5382 Chronic fatigue, unspecified: Secondary | ICD-10-CM

## 2011-10-09 LAB — CBC WITH DIFFERENTIAL/PLATELET
BASO%: 0.6 % (ref 0.0–2.0)
Basophils Absolute: 0 10*3/uL (ref 0.0–0.1)
EOS%: 2.2 % (ref 0.0–7.0)
HCT: 37.3 % (ref 34.8–46.6)
LYMPH%: 24.2 % (ref 14.0–49.7)
MCH: 31.4 pg (ref 25.1–34.0)
MCHC: 34.1 g/dL (ref 31.5–36.0)
MCV: 91.9 fL (ref 79.5–101.0)
MONO%: 9.4 % (ref 0.0–14.0)
NEUT%: 63.6 % (ref 38.4–76.8)
Platelets: 165 10*3/uL (ref 145–400)

## 2011-10-09 LAB — COMPREHENSIVE METABOLIC PANEL
ALT: 33 U/L (ref 0–35)
AST: 30 U/L (ref 0–37)
BUN: 17 mg/dL (ref 6–23)
Creatinine, Ser: 0.84 mg/dL (ref 0.50–1.10)
Total Bilirubin: 0.5 mg/dL (ref 0.3–1.2)

## 2011-10-13 ENCOUNTER — Ambulatory Visit (INDEPENDENT_AMBULATORY_CARE_PROVIDER_SITE_OTHER): Payer: Medicare Other | Admitting: Family Medicine

## 2011-10-13 ENCOUNTER — Encounter: Payer: Self-pay | Admitting: Family Medicine

## 2011-10-13 VITALS — BP 140/72 | Temp 98.7°F | Wt 173.0 lb

## 2011-10-13 DIAGNOSIS — I10 Essential (primary) hypertension: Secondary | ICD-10-CM

## 2011-10-13 NOTE — Patient Instructions (Signed)
Continue your current medications.  Check your blood pressure weekly.  Return in one year for your annual physical examination  Labs one week prior

## 2011-10-13 NOTE — Progress Notes (Signed)
  Subjective:    Patient ID: Briana Smith, female    DOB: Aug 28, 1941, 71 y.o.   MRN: [id number]  HPI Briana Smith is a 71 year old female, nonsmoker, who comes in today for follow-up of hypertension.  Her blood pressure on the Zestoretic 20 -- 12.5.  The dose of which is one half tablet daily is normal.  No side effects from medication.  Metabolic panel shows no change in GFR or potassium   Review of Systems    General and metabolic review of systems otherwise negative Objective:   Physical Exam  Well-developed well-nourished, female in no acute distress.  BP right arm sitting position 135/70      Assessment & Plan:  Hypertension a goal continue current therapy.  Follow-up one year or sooner if any problems

## 2011-10-16 ENCOUNTER — Telehealth: Payer: Self-pay | Admitting: *Deleted

## 2011-10-16 ENCOUNTER — Encounter: Payer: Self-pay | Admitting: Physician Assistant

## 2011-10-16 ENCOUNTER — Ambulatory Visit (HOSPITAL_BASED_OUTPATIENT_CLINIC_OR_DEPARTMENT_OTHER): Payer: Medicare Other | Admitting: Physician Assistant

## 2011-10-16 VITALS — BP 117/73 | HR 69 | Temp 97.9°F | Ht 60.25 in | Wt 174.3 lb

## 2011-10-16 DIAGNOSIS — C50519 Malignant neoplasm of lower-outer quadrant of unspecified female breast: Secondary | ICD-10-CM

## 2011-10-16 NOTE — Progress Notes (Signed)
Hematology and Oncology Follow Up Visit  Briana Smith [id number] [birth year] 70 y.o. 10/16/2011    HPI: Briana Smith had screening mammography August 14, 2010 at the breast center showing a possible mass in the left breast.  She was brought back for additional views November 22 and Dr. Deboraha Sprang was able to confirm an ill defined mass in the inferior portion of the left breast which was not palpable.  Ultrasound showed this to be irregularly marginated and to measure approximately 1 cm.  The left axilla was unremarkable.  Biopsy was performed the same day and showed ([id number]) an intermediate grade invasive ductal carcinoma which was estrogen receptor positive at 100%, progesterone receptor positive at 99% with an elevated MIB-1 at 44%, but no HER2/neu amplification by CISH with a ratio of 1.62.  With this information the patient was referred to Dr. Derrell Lolling and bilateral breast MRIs were obtained on September 04, 2010.  This confirmed a 1.7 cm irregularly marginated mass in the left breast consistent with a previously diagnosed cancer.  They could also see the clip from her prior benign left breast biopsy obtained in 2000.   However, to complicate things there was an area in the left and upper manubrium with abnormal signal intensity.  It was felt that a further evaluation would be helpful and the patient had a bone scan on December 7.  This showed a strong focus of uptake in the central upper sternum and a second one at the sternal manubrial junction slightly to the left.  There was also a symmetric increased uptake in the right proximal humerus and a smaller area of uptake again rather faint in the left proximal femur.  This was felt to be most consistent with metastatic disease.  The patient then saw Dr. Welton Flakes in our office and she discussed her disease in terms of stage IV breast cancer.   However, to complete the work up she set the patient up for a PET scan which was performed on December 12 with as  usual the associated CT scan.  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  There was some fluid within the joints as well on MRI review.  Similarly, with the lesion in the sternomanubrial joints, there was an SUV maximum of 3.1 and significant sclerosis more consistent with arthropathy.  The increased uptake in the right proximal humerus could not be reproduced by CT or PET.  The breast primary had an SUV of 1.8 and measured 1.0 cm on these images.  There was no adenopathy in the axilla or chest and the rest of the CT/PET was negative except for an 8 mm right thyroid nodule which will require a work up at some point seen only the CT scan, not on the PET scan.  There was a nonhypermetabolic right hepatic lobe lesion felt to be most consistent with a complex cyst or hemangioma.  There was also hemangioma in the L3 vertebral body and in other, thoracic vertebrae.  Finally, there was an exophytic upper pole left renal lesion felt to be most likely a cyst.  With this information, the patient sort a second opinion under Dr. Dossie Der at [facility] and she was seen there January 9.  Dr. Amie Critchley repeated the bone scan and PET scan and proceeded to obtain plain films of the left hip and proximal humerus.  I do not have  a final reading from Dr. Amie Critchley, but according to the patient she understands Dr. Amie Critchley to have said that she did not have stage IV disease.  We will try to obtain those records.  The patient then proceeded to left partial mastectomy with sentinel lymph node sampling October 23, 2010.  The results from that procedure ([id number]) showed a 1.2 cm grade 2 invasive ductal carcinoma with 1 of 2 sentinel lymph nodes involved.  Margins were negative.  There was evidence of lymphovascular invasion.  HER2/neu CISH was repeated and was again negative with the ratio of 1.58.  Briana Smith  received adjuvant chemotherapy consisting of 4 cycles of docetaxel and cyclophosphamide. Chemotherapy was completed in May of 2012 and was followed by radiation therapy. Radiation was completed in mid August 2012, at which time patient began on tamoxifen, 20 mg daily. She does have a history of osteopenia. The plan is to continue tamoxifen for 2-3 years, then possibly switch to letrozole.  Interim History:   Briana Smith returns today for routine followup of her left breast carcinoma. She has been on tamoxifen now since August of 2012 is tolerating extremely well. Interval history is unremarkable. She's had no hot flashes. No change in vision. No peripheral swelling or evidence of abnormal clotting. Initially, she had some slight vaginal discharge, but that has resolved. She's had no vaginal dryness and no abnormal vaginal bleeding.  Briana Smith is a little anxious today when talking about her original  staging scans. Recall that her original bone scan in December of 2011 showed some questionable areas of uptake within the manubrium, proximal right humerus, and proximal left femur, raising the question of skeletal metastasis. This was evaluated further with a PET scan, and although metastatic disease could not be completely excluded, this was thought to be arthropathy and degenerative changes. There was also a well-circumscribed hypoattenuating right liver lobe lesion, not significantly hypermetabolic, and favored to represent a hemangioma or cyst. This was reviewed with Briana Smith at that time, it was decided that she does not have metastatic disease. She also had a second opinion at [facility] as noted above.  Briana Smith tells me she starts thinking about this every once in a while, and it makes her a little nervous. She wanted to discuss the possibility of restaging scans sooner than 2 years to reevaluate these questionable areas.  A detailed review of systems is otherwise noncontributory as noted below.  Review of  Systems: Constitutional:  no weight loss, fever, night sweats and feels well Eyes: uses glasses [id number] Cardiovascular: no chest pain or dyspnea on exertion Respiratory: no cough, shortness of breath, or wheezing Neurological: negative Dermatological: negative Gastrointestinal: no abdominal pain, change in bowel habits, or black or bloody stools Genito-Urinary: no dysuria, trouble voiding, or hematuria Hematological and Lymphatic: negative Breast: negative Musculoskeletal: negative Remaining ROS negative.  FAMILY HISTORY:  The patient's father died at the age of 27 from COPD/emphysema in the setting of tobacco abuse.  The patient's mother died at the age of 45 in the setting of dementia.  The patient has one brother with a history of neck cancer.  There is no breast or ovarian cancer in the family to her knowledge.  GYNECOLOGIC HISTORY:  She is Gx, P3, menarche age 58 and again status post TAH-BSO in 18 after which she took hormone replacement for approximately 24 years.  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  Daughter Briana Smith lives  in [place] and she works as a Teacher, early years/pre.  Daughter Briana Smith lives in [place] and works as a Firefighter, son Briana Smith lives in [place] and works for [employer].  The patient has three grandchildren.  She attends the [employer]  Medications:   I have reviewed the patient's current medications.  Current Outpatient Prescriptions  Medication Sig Dispense Refill  . aspirin 81 MG tablet Take 81 mg by mouth daily.      Marland Kitchen CALCIUM PO Take by mouth.        . Cholecalciferol (D-3-5 PO) Take by mouth.        Marland Kitchen lisinopril-hydrochlorothiazide (PRINZIDE,ZESTORETIC) 20-12.5 MG per tablet One half tab q.a.m.  50 tablet  3  . MAGNESIUM PO Take by mouth daily.        . Multiple Vitamin (MULTIVITAMIN) tablet Take 1 tablet by mouth daily.        . tamoxifen  (NOLVADEX) 20 MG tablet daily.      . timolol (TIMOPTIC) 0.5 % ophthalmic solution BID times 48H.        Allergies: No Known Allergies  Physical Exam: Filed Vitals:   10/16/11 1050  BP: 117/73  Pulse: 69  Temp: 97.9 F (36.6 C)   HEENT:  Sclerae anicteric, conjunctivae pink.  Oropharynx clear.  No mucositis or candidiasis.   Nodes:  No cervical, supraclavicular, or axillary lymphadenopathy palpated.  Breast Exam:  Right breast, benign with no masses, skin changes, or nipple inversion. Left breast, status post lumpectomy. Well-healed incision with no suspicious nodularity, skin changes, or evidence of local recurrence.   Lungs:  Clear to auscultation bilaterally.  No crackles, rhonchi, or wheezes.   Heart:  Regular rate and rhythm.   Abdomen:  Soft, nontender.  Positive bowel sounds.  No organomegaly or masses palpated.   Musculoskeletal:  No focal spinal tenderness to palpation.  Extremities:  Benign.  No peripheral edema or cyanosis.   Skin:  Benign.   Neuro:  Nonfocal.   Lab Results: Lab Results  Component Value Date   WBC 3.3* 10/09/2011   HGB 12.7 10/09/2011   HCT 37.3 10/09/2011   MCV 91.9 10/09/2011   PLT 165 10/09/2011   NEUTROABS 2.1 10/09/2011     Chemistry      Component Value Date/Time   NA 138 10/09/2011 0953   K 4.2 10/09/2011 0953   CL 102 10/09/2011 0953   CO2 29 10/09/2011 0953   BUN 17 10/09/2011 0953   CREATININE 0.84 10/09/2011 0953      Component Value Date/Time   CALCIUM 9.3 10/09/2011 0953   ALKPHOS 56 10/09/2011 0953   AST 30 10/09/2011 0953   ALT 33 10/09/2011 0953   BILITOT 0.5 10/09/2011 0953        Radiological Studies: 08/18/2011 DIGITAL DIAGNOSTIC BILATERAL MAMMOGRAM WITH CAD  Comparison: 08/14/2010, 07/24/2009  Findings: The breast tissue is almost entirely fatty. Left  lumpectomy and radiation therapy changes are present. There is no  dominant mass, nonsurgical architectural distortion or  calcification to suggest malignancy.  Mammographic images were  processed with CAD.  IMPRESSION:  No mammographic evidence of malignancy. Yearly diagnostic  mammography is suggested.  BI-RADS CATEGORY 2: Benign finding(s).  Original Report Authenticated By: Briana Smith, M.D.   Assessment:  A 71 year old [place] woman status post left lumpectomy and sentinel lymph node sampling January 2012 for a T1c N1 grade 2 invasive ductal carcinoma strongly estrogen and progesterone receptor positive, HER2 negative, MIB-1 of 44%, treated adjuvantly with  docetaxel and cyclophosphamide x4 completed May 2012 followed by radiation, completed August 10. Began on tamoxifen in August 2012, 20 mg daily. The plan is to continue tamoxifen for at least 2-3 years, then possibly switch to letrozole.  Plan:  Dezyrae appears to be doing quite well, and there is no clinical evidence of disease recurrence. Over half of our 30 minute appointment today was spent discussing the issues regarding her previous bone scan and PET scan as noted above. The scans were reviewed once again with Dr. Darnelle Catalan. We did offer to proceed now with MRIs of the manubrium, left femur, and abdomen with attention to the liver. After much discussion, Flossie has decided to "wait a while for that". I did ask her to call us if she becomes more anxious, has any pain, or simply changes her mind, and we will consider pursuing those MRIs at that time. We also discussed the possibility of a newly zoledronic acid, which [place] declines.  Otherwise Roise will continue on the tamoxifen as before which she is tolerating very well. She return to see [place] in 6 months.  This plan was reviewed with the patient, who voices understanding and agreement.  She knows to call with any changes or problems.    Marshella Tello, PA-C 10/16/2011

## 2011-10-16 NOTE — Telephone Encounter (Signed)
gave patient appointment for 04-2012 labs being a week before

## 2011-10-30 ENCOUNTER — Encounter (INDEPENDENT_AMBULATORY_CARE_PROVIDER_SITE_OTHER): Payer: Self-pay | Admitting: General Surgery

## 2011-10-30 ENCOUNTER — Ambulatory Visit (INDEPENDENT_AMBULATORY_CARE_PROVIDER_SITE_OTHER): Payer: Medicare Other | Admitting: General Surgery

## 2011-10-30 ENCOUNTER — Encounter (INDEPENDENT_AMBULATORY_CARE_PROVIDER_SITE_OTHER): Payer: Medicare Other | Admitting: General Surgery

## 2011-10-30 VITALS — BP 124/66 | HR 60 | Temp 98.2°F | Resp 16 | Ht 61.0 in | Wt 173.2 lb

## 2011-10-30 DIAGNOSIS — Z9889 Other specified postprocedural states: Secondary | ICD-10-CM

## 2011-10-30 NOTE — Progress Notes (Signed)
Subjective:     Patient ID: Briana Smith, female   DOB: 07/09/1941, 71 y.o.   MRN: [id number]  HPI [name] returns for a wound check. She had her Port-A-Cath removed on September 04, 2011. She says the wound has healed normally. She occasionally feels a little twinge of discomfort but otherwise everything is okay. She continue regular followup with Dr. Marikay Alar Magrinat.  Review of Systems     Objective:   Physical Exam Constitutional: Patient was well. In no distress.  Chest: right infraclavicular incision has healed normally. No complications.    Assessment:     Status post Port-A-Cath removal, healed uneventfully.  Status post left partial mastectomy and sentinel biopsy on January of 2012. No problems with healing  Invasive ductal carcinoma left breast, pathology stage T1c., N1, strong receptor positive, HER-2 negative, T. 6744%  Status post adjuvant radiation therapy and adjuvant chemotherapy    Plan:     Continue regular clinical followup with Dr. Marikay Alar Magrinat.  Bilateral mammograms are scheduled for November 2013.  Return to see me in one year.  Briana Smith. Derrell Lolling, M.D., [facility] General and Minimally invasive Surgery Breast and Colorectal Surgery Office:   [phone] Pager:   [phone]

## 2011-10-30 NOTE — Patient Instructions (Signed)
The surgical scar where we removed your Port-A-Cath looks like it is healing fine. Discomfort should resolve in one to 2 months. No restrictions on your activities. I'll see you in one year after you get your mammograms.

## 2012-04-13 ENCOUNTER — Other Ambulatory Visit (HOSPITAL_BASED_OUTPATIENT_CLINIC_OR_DEPARTMENT_OTHER): Payer: Medicare Other | Admitting: Lab

## 2012-04-13 ENCOUNTER — Other Ambulatory Visit: Payer: Medicare Other | Admitting: Lab

## 2012-04-13 DIAGNOSIS — C50519 Malignant neoplasm of lower-outer quadrant of unspecified female breast: Secondary | ICD-10-CM

## 2012-04-13 LAB — CBC WITH DIFFERENTIAL/PLATELET
BASO%: 0.8 % (ref 0.0–2.0)
LYMPH%: 19.2 % (ref 14.0–49.7)
MCHC: 33.3 g/dL (ref 31.5–36.0)
MONO#: 0.4 10*3/uL (ref 0.1–0.9)
Platelets: 166 10*3/uL (ref 145–400)
RBC: 3.74 10*6/uL (ref 3.70–5.45)

## 2012-04-13 LAB — COMPREHENSIVE METABOLIC PANEL
ALT: 24 U/L (ref 0–35)
Alkaline Phosphatase: 42 U/L (ref 39–117)
CO2: 29 mEq/L (ref 19–32)
Sodium: 139 mEq/L (ref 135–145)
Total Bilirubin: 0.2 mg/dL — ABNORMAL LOW (ref 0.3–1.2)
Total Protein: 6.5 g/dL (ref 6.0–8.3)

## 2012-04-19 ENCOUNTER — Telehealth: Payer: Self-pay | Admitting: Oncology

## 2012-04-19 ENCOUNTER — Ambulatory Visit (HOSPITAL_BASED_OUTPATIENT_CLINIC_OR_DEPARTMENT_OTHER): Payer: Medicare Other | Admitting: Oncology

## 2012-04-19 VITALS — BP 135/77 | HR 72 | Temp 98.7°F | Ht 61.0 in | Wt 165.4 lb

## 2012-04-19 DIAGNOSIS — C50519 Malignant neoplasm of lower-outer quadrant of unspecified female breast: Secondary | ICD-10-CM

## 2012-04-19 NOTE — Telephone Encounter (Signed)
gve the pt her April 2014 appt calendar °

## 2012-04-19 NOTE — Progress Notes (Signed)
Hematology and Oncology Follow Up Visit  Briana Smith [id number] 02/14/1941 71 y.o. 04/19/2012    HPI: Briana Smith had screening mammography August 14, 2010 at the [facility] showing a possible mass in the left breast.  She was brought back for additional views November 22 and Dr. Deboraha Sprang was able to confirm an ill defined mass in the inferior portion of the left breast which was not palpable.  Ultrasound showed this to be irregularly marginated and to measure approximately 1 cm.  The left axilla was unremarkable.  Biopsy was performed the same day and showed ([id number]) an intermediate grade invasive ductal carcinoma which was estrogen receptor positive at 100%, progesterone receptor positive at 99% with an elevated MIB-1 at 44%, but no HER2/neu amplification by CISH with a ratio of 1.62.  Bilateral breast MRIs were obtained on September 04, 2010.  This confirmed a 1.7 cm irregularly marginated mass in the left breast consistent with the previously diagnosed cancer.  They could also see the clip from her prior benign left breast biopsy obtained in 2000.   However, to complicate things there was an area in the left and upper manubrium with abnormal signal intensity.  It was felt that a further evaluation would be helpful and the patient had a bone scan on December 7.  This showed a strong focus of uptake in the central upper sternum and a second one at the sternal manubrial junction slightly to the left.  There was also a symmetric increased uptake in the right proximal humerus and a smaller area of uptake again rather faint in the left proximal femur.  This was felt to be most consistent with metastatic disease.  The patient then saw Dr. Welton Flakes in our office and she discussed her disease in terms of stage IV breast cancer.   Dr. Welton Flakes set the patient up for a PET scan which was performed on December 12 with associated CT scan.  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  There was some fluid within the joints as well on MRI review.  Similarly, with the lesion in the sternomanubrial joints, there was an SUV maximum of 3.1 and significant sclerosis more consistent with arthropathy.  The increased uptake in the right proximal humerus could not be reproduced by CT or PET.  The breast primary had an SUV of 1.8 and measured 1.0 cm on these images.  There was no adenopathy in the axilla or chest and the rest of the CT/PET was negative except for an 8 mm right thyroid nodule which will require a work up at some point seen only the CT scan, not on the PET scan.  There was a nonhypermetabolic right hepatic lobe lesion felt to be most consistent with a complex cyst or hemangioma.  There was also hemangioma in the L3 vertebral body and in other, thoracic vertebrae.  Finally, there was an exophytic upper pole left renal lesion felt to be most likely a cyst.  The patient then sought a second opinion under Dr. Dossie Der at [facility] and she was seen there October 14, 2010.  Dr. Amie Critchley repeated the bone scan and PET scan and proceeded to obtain plain films of the left hip and proximal humerus. Those studies confirmed that the patient essentially she had some arthritis but no evidence of metastatic disease. The patient then proceeded to left partial mastectomy  with sentinel lymph node sampling October 23, 2010.  The results from that procedure ([id number]) showed a 1.2 cm grade 2 invasive ductal carcinoma with 1 of 2 sentinel lymph nodes involved.  Margins were negative.  There was evidence of lymphovascular invasion.  HER2/neu CISH was repeated and was again negative with the ratio of 1.58.  Briana Smith received adjuvant chemotherapy consisting of 4 cycles of docetaxel and cyclophosphamide. Chemotherapy was completed in May of 2012 and was followed by radiation therapy. Radiation was completed in mid August  2012, at which time patient began on tamoxifen, 20 mg daily. Her subsequent history is as detailed below.  Interim History:   Briana Smith returns today for followup of her breast cancer. Interval history is significant for her middle daughter going through a divorce. The grandchildren, 53 and 26, RR experiencing quite a bit of stress. All this is of very difficult and [name] and her family.  Review of Systems: She is somewhat anxious and upset because of the family issues, and as a result she is sleeping poorly and has a bit of fatigue. She is not exercising as regularly as she would like. She has minimal sinus symptoms, and some arthritis symptoms particularly involving the right hand, where she had a prior carpal tunnel release. She is not aware of any symptoms at all from the tamoxifen. A detailed review of systems was otherwise noncontributory.  FAMILY HISTORY:  The patient's father died at the age of 45 from COPD/emphysema in the setting of tobacco abuse.  The patient's mother died at the age of 1 in the setting of dementia.  The patient has one brother with a history of neck cancer.  There is no breast or ovarian cancer in the family to her knowledge.  GYNECOLOGIC HISTORY:  She is Gx, P3, menarche age 16 and again status post TAH-BSO in 1 after which she took hormone replacement for approximately 24 years.  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  Daughter Briana Smith lives in [place] and she works as a Teacher, early years/pre.  Daughter Briana Smith lives in [place] and works as a Firefighter Son Briana Smith lives in [place] and works for [employer].  The patient has three grandchildren.  She attends the [employer]  Medications:   I have reviewed the patient's current medications.  Current Outpatient Prescriptions  Medication Sig Dispense Refill  . aspirin 81 MG tablet Take 81 mg by mouth daily.      Marland Kitchen CALCIUM PO  Take by mouth.        . Cholecalciferol (D-3-5 PO) Take by mouth.        Marland Kitchen lisinopril-hydrochlorothiazide (PRINZIDE,ZESTORETIC) 20-12.5 MG per tablet One half tab q.a.m.  50 tablet  3  . MAGNESIUM PO Take by mouth daily.        . Multiple Vitamin (MULTIVITAMIN) tablet Take 1 tablet by mouth daily.        . tamoxifen (NOLVADEX) 20 MG tablet daily.      . timolol (TIMOPTIC) 0.5 % ophthalmic solution BID times 48H.        Allergies:  Allergies  Allergen Reactions  . Peanut-Containing Drug Products     All nuts cause fever blisters    Physical Exam: Middle-aged white woman who appears well Filed Vitals:   04/19/12 1134  BP: 135/77  Pulse: 72  Temp: 98.7 F (37.1 C)   HEENT:  Sclerae anicteric, conjunctivae pink.  Oropharynx clear.  No mucositis or candidiasis.   Nodes:  No cervical, supraclavicular, or axillary lymphadenopathy palpated.  Breast Exam:  Right breast, benign with no masses, skin changes, or nipple inversion. Left breast, status post lumpectomy. Well-healed incision with no suspicious nodularity, skin changes, or evidence of local recurrence.   Lungs:  Clear to auscultation bilaterally.  No crackles, rhonchi, or wheezes.   Heart:  Regular rate and rhythm.   Abdomen:  Soft, nontender.  Positive bowel sounds.  No organomegaly or masses palpated.   Musculoskeletal:  No focal spinal tenderness to palpation. No sternal or rib cage tenderness. Extremities:  Benign.  No peripheral edema or cyanosis.   Neuro:  Nonfocal.   Lab Results: Lab Results  Component Value Date   WBC 4.9 04/13/2012   HGB 11.8 04/13/2012   HCT 35.5 04/13/2012   MCV 94.8 04/13/2012   PLT 166 04/13/2012   NEUTROABS 3.4 04/13/2012     Chemistry      Component Value Date/Time   NA 139 04/13/2012 1608   K 4.1 04/13/2012 1608   CL 101 04/13/2012 1608   CO2 29 04/13/2012 1608   BUN 19 04/13/2012 1608   CREATININE 0.97 04/13/2012 1608      Component Value Date/Time   CALCIUM 9.2 04/13/2012 1608   ALKPHOS 42 04/13/2012  1608   AST 19 04/13/2012 1608   ALT 24 04/13/2012 1608   BILITOT 0.2* 04/13/2012 1608        Radiological Studies: 08/18/2011 DIGITAL DIAGNOSTIC BILATERAL MAMMOGRAM WITH CAD  Comparison: 08/14/2010, 07/24/2009  Findings: The breast tissue is almost entirely fatty. Left  lumpectomy and radiation therapy changes are present. There is no  dominant mass, nonsurgical architectural distortion or  calcification to suggest malignancy.  Mammographic images were processed with CAD.  IMPRESSION:  No mammographic evidence of malignancy. Yearly diagnostic  mammography is suggested.  BI-RADS CATEGORY 2: Benign finding(s).  Original Report Authenticated By: Daryl Eastern, M.D.   Assessment:  71 y.o.  [place] woman   (1) status post left lumpectomy and sentinel lymph node sampling January 2012 for a pT1c pN1, stage IIB  invasive ductal carcinoma, grade 2,  strongly estrogen and progesterone receptor positive, HER2 negative, MIB-1 of 44%,  (2) treated adjuvantly with docetaxel and cyclophosphamide x4 completed May 2012   (3) radiation completed August 2012.   (4) began on tamoxifen in August 2012  Plan:  As far as the breast cancer goes, Shaniquia is doing terrific. We are continuing the tamoxifen at least another year and then considering whether we want to switch to an aromatase inhibitor or not. As far as the right hand symptoms are concerned, I suggested she wear a splint at night. If that doesn't work she will need to go back to her Hydrographic surveyor. We discussed the family situation in detail. I don't see a simple solution to that. Hopefully with a little bit more time all the legal issues we'll sort out and certainly by the time the children go to college things will improve, if not before.  She knows to call for any problems that may develop before the next visit.   Lowella Dell, MD 04/19/2012

## 2012-07-15 ENCOUNTER — Other Ambulatory Visit: Payer: Self-pay | Admitting: Oncology

## 2012-07-15 DIAGNOSIS — Z853 Personal history of malignant neoplasm of breast: Secondary | ICD-10-CM

## 2012-08-10 ENCOUNTER — Other Ambulatory Visit: Payer: Self-pay | Admitting: Oncology

## 2012-08-10 DIAGNOSIS — C50519 Malignant neoplasm of lower-outer quadrant of unspecified female breast: Secondary | ICD-10-CM

## 2012-08-18 ENCOUNTER — Ambulatory Visit
Admission: RE | Admit: 2012-08-18 | Discharge: 2012-08-18 | Disposition: A | Discharge status: Home / Self Care | Payer: Medicare Other | Source: Ambulatory Visit | Attending: Oncology | Admitting: Oncology

## 2012-08-18 DIAGNOSIS — Z853 Personal history of malignant neoplasm of breast: Secondary | ICD-10-CM

## 2012-09-11 ENCOUNTER — Other Ambulatory Visit: Payer: Self-pay | Admitting: Family Medicine

## 2012-10-04 ENCOUNTER — Encounter (INDEPENDENT_AMBULATORY_CARE_PROVIDER_SITE_OTHER): Payer: Self-pay | Admitting: General Surgery

## 2012-10-13 ENCOUNTER — Other Ambulatory Visit (INDEPENDENT_AMBULATORY_CARE_PROVIDER_SITE_OTHER): Payer: Medicare Other

## 2012-10-13 DIAGNOSIS — Z0000 Encounter for general adult medical examination without abnormal findings: Secondary | ICD-10-CM

## 2012-10-13 LAB — BASIC METABOLIC PANEL
BUN: 17 mg/dL (ref 6–23)
Calcium: 9.3 mg/dL (ref 8.4–10.5)
Creatinine, Ser: 0.9 mg/dL (ref 0.4–1.2)
GFR: 66.78 mL/min (ref >60.00)
Glucose, Bld: 95 mg/dL (ref 70–99)
Sodium: 142 mEq/L (ref 135–145)

## 2012-10-13 LAB — HEPATIC FUNCTION PANEL
AST: 21 U/L (ref 0–37)
Alkaline Phosphatase: 40 U/L (ref 39–117)
Total Bilirubin: 0.6 mg/dL (ref 0.3–1.2)

## 2012-10-13 LAB — LDL CHOLESTEROL, DIRECT: Direct LDL: 116.5 mg/dL

## 2012-10-13 LAB — CBC WITH DIFFERENTIAL/PLATELET
Basophils Absolute: 0 10*3/uL (ref 0.0–0.1)
Lymphocytes Relative: 24.5 % (ref 12.0–46.0)
Monocytes Relative: 8.1 % (ref 3.0–12.0)
Neutrophils Relative %: 64.7 % (ref 43.0–77.0)
Platelets: 164 10*3/uL (ref 150.0–400.0)
RDW: 13.7 % (ref 11.5–14.6)

## 2012-10-13 LAB — LIPID PANEL
Cholesterol: 206 mg/dL — ABNORMAL HIGH (ref 0–200)
HDL: 62.3 mg/dL (ref >39.00)
Triglycerides: 99 mg/dL (ref 0.0–149.0)

## 2012-10-13 LAB — POCT URINALYSIS DIPSTICK
Bilirubin, UA: NEGATIVE
Glucose, UA: NEGATIVE
Nitrite, UA: NEGATIVE
Spec Grav, UA: 1.025

## 2012-10-13 LAB — TSH: TSH: 5.05 u[IU]/mL (ref 0.35–5.50)

## 2012-10-20 ENCOUNTER — Encounter: Payer: Self-pay | Admitting: Family Medicine

## 2012-10-20 ENCOUNTER — Ambulatory Visit (INDEPENDENT_AMBULATORY_CARE_PROVIDER_SITE_OTHER): Payer: Medicare Other | Admitting: Family Medicine

## 2012-10-20 VITALS — BP 110/70 | Temp 98.7°F | Ht 60.25 in | Wt 172.0 lb

## 2012-10-20 DIAGNOSIS — R319 Hematuria, unspecified: Secondary | ICD-10-CM

## 2012-10-20 DIAGNOSIS — C50519 Malignant neoplasm of lower-outer quadrant of unspecified female breast: Secondary | ICD-10-CM

## 2012-10-20 DIAGNOSIS — I10 Essential (primary) hypertension: Secondary | ICD-10-CM

## 2012-10-20 DIAGNOSIS — Z85828 Personal history of other malignant neoplasm of skin: Secondary | ICD-10-CM

## 2012-10-20 DIAGNOSIS — Z23 Encounter for immunization: Secondary | ICD-10-CM

## 2012-10-20 LAB — POCT URINALYSIS DIPSTICK
Bilirubin, UA: NEGATIVE
Blood, UA: NEGATIVE
Glucose, UA: NEGATIVE
Ketones, UA: NEGATIVE
Leukocytes, UA: NEGATIVE
Nitrite, UA: NEGATIVE
Protein, UA: NEGATIVE
Spec Grav, UA: 1.015
Urobilinogen, UA: 0.2
pH, UA: 6

## 2012-10-20 MED ORDER — LISINOPRIL-HYDROCHLOROTHIAZIDE 20-12.5 MG PO TABS: ORAL_TABLET | ORAL | Status: DC

## 2012-10-20 NOTE — Patient Instructions (Signed)
Continue your current medications  Followup in 1 year sooner if any problems 

## 2012-10-20 NOTE — Progress Notes (Signed)
  Subjective:    Patient ID: Briana Smith, female    DOB: 1941-07-19, 72 y.o.   MRN: [id number]  HPI Briana Smith is a delightful 72 year old married female nonsmoker who comes in today for general physical examination because of a history of hypertension, and breast cancer  She takes Zestoretic 20-12.5 dose one half tab every morning for hypertension BP normal 110/70  She's on tamoxifen and is followed at the oncology center for breast cancer. She had a lumpectomy on the left side was postop radiation. She's done well and has had no recurrence.  She gets routine eye care because she wears glasses, regular dental care, BSE monthly, and you mammography, colonoscopy and GI, tetanus 2003 booster today given, shingles 2010, Pneumovax 2007, seasonal flu shot 2013.  Cognitive function normal she walks on a regular basis home health safety reviewed no issues identified, no guns in the house, she does have a health care power of attorney and living will.  She states she feels well and has no complaints. She recently went to see her dermatologist who froze 4 lesions. She's had a history of skin cancer. She does have some vaginal dryness but she's not having sexual intercourse because her husband is impotent. She had her uterus and ovaries removed in her mid 30s for endometriosis no cancer therefore pelvic examination not indicated    Review of Systems  Constitutional: Negative.   HENT: Negative.   Eyes: Negative.   Respiratory: Negative.   Cardiovascular: Negative.   Gastrointestinal: Negative.   Genitourinary: Negative.   Musculoskeletal: Negative.   Neurological: Negative.   Hematological: Negative.   Psychiatric/Behavioral: Negative.        Objective:   Physical Exam  Constitutional: She appears well-developed and well-nourished.  HENT:  Head: Normocephalic and atraumatic.  Right Ear: External ear normal.  Left Ear: External ear normal.  Nose: Nose normal.  Mouth/Throat: Oropharynx is  clear and moist.  Eyes: EOM are normal. Pupils are equal, round, and reactive to light.  Neck: Normal range of motion. Neck supple. No thyromegaly present.  Cardiovascular: Normal rate, regular rhythm, normal heart sounds and intact distal pulses.  Exam reveals no gallop and no friction rub.   No murmur heard. Pulmonary/Chest: Effort normal and breath sounds normal.  Abdominal: Soft. Bowel sounds are normal. She exhibits no distension and no mass. There is no tenderness. There is no rebound.  Genitourinary:       Bilateral breast exam shows the right breast to be symmetrical skin normal no nipple discharge no palpable masses.  Left breast scar from previous surgery skin normal no nipple discharge and no palpable masses except for scarring from previous surgery and tattoos from previous radiation  Musculoskeletal: Normal range of motion.  Lymphadenopathy:    She has no cervical adenopathy.  Neurological: She is alert. She has normal reflexes. No cranial nerve deficit. She exhibits normal muscle tone. Coordination normal.  Skin: Skin is warm and dry.  Psychiatric: She has a normal mood and affect. Her behavior is normal. Judgment and thought content normal.          Assessment & Plan:  Healthy female  Hypertension continue current medication  Breast cancer continue tamoxifen  Hearing loss referred for an audiology evaluation

## 2012-11-04 ENCOUNTER — Encounter (INDEPENDENT_AMBULATORY_CARE_PROVIDER_SITE_OTHER): Payer: Self-pay | Admitting: General Surgery

## 2012-11-04 ENCOUNTER — Ambulatory Visit (INDEPENDENT_AMBULATORY_CARE_PROVIDER_SITE_OTHER): Payer: Medicare Other | Admitting: General Surgery

## 2012-11-04 VITALS — BP 148/78 | HR 72 | Temp 98.7°F | Resp 20 | Ht 60.0 in | Wt 172.2 lb

## 2012-11-04 DIAGNOSIS — C50519 Malignant neoplasm of lower-outer quadrant of unspecified female breast: Secondary | ICD-10-CM

## 2012-11-04 NOTE — Patient Instructions (Signed)
Your breast exam and lymph node exam today is normal. Your bilateral mammograms from November are also normal. There is no evidence of cancer.  Continue to take the tamoxifen and keep your regular appointment with Dr. Darnelle Catalan.  Return to see Dr. Derrell Lolling in one year, after you gets your mammograms.

## 2012-11-04 NOTE — Progress Notes (Signed)
Patient ID: Briana Smith, female   DOB: [birth year], 72 y.o.   MRN: [id number] History: This patient returns for long-term followup regarding her left breast cancer. On 10/23/2010 she underwent left partial mastectomy and sentinel node biopsy. Pathologic stage T1c, N1, receptor-positive, HER-2 negative, Ki-67 44%. I placed a Port-A-Cath in March of 2012. She received adjuvant chemotherapy and then adjuvant radiation therapy. She has done well. She feels fine. No recurrence to date. She takes tamoxifen and sees Dr. Darnelle Catalan every 6 months. Bilateral mammograms on 08/18/2012 looked fine, no focal abnormality, BI-RADS category 1.  ROS: 10 system review of systems negative except as described above.  Exam: Patient looks well. No distress. Good spirits. Neck no adenopathy or mass. No jugular venous distention Lungs clear to auscultation bilaterally.Old port site right infraclavicular area looks good. Heart regular rate and rhythm. No murmur. No ectopy. Breasts medium size. Soft. Radially oriented lumpectomy incision at 6:00 position left breast well healed. Left axillary incision well-healed. No palpable mass in either breast. No other skin changes. No axillary mass.  Assessment invasive cancer left breast, 6:00 position, pathologic stage T1c, N1, receptor positive, HER-2-negative. No evidence of recurrence 2 years following left partial mastectomy, SLN biopsy, adjuvant chemotherapy, adjuvant radiation therapy  Plan: Continue tamoxifen Continue followup Dr. Darnelle Catalan Repeat mammograms in 1 year Return to see me in one year after mammograms.   [name]. Derrell Lolling, M.D., [facility] General and Minimally invasive Surgery Breast and Colorectal Surgery Office:   [phone] Pager:   [phone]

## 2013-01-12 ENCOUNTER — Other Ambulatory Visit: Payer: Self-pay | Admitting: Physician Assistant

## 2013-01-12 DIAGNOSIS — C50519 Malignant neoplasm of lower-outer quadrant of unspecified female breast: Secondary | ICD-10-CM

## 2013-01-13 ENCOUNTER — Other Ambulatory Visit (HOSPITAL_BASED_OUTPATIENT_CLINIC_OR_DEPARTMENT_OTHER): Payer: Medicare Other | Admitting: Lab

## 2013-01-13 DIAGNOSIS — C50519 Malignant neoplasm of lower-outer quadrant of unspecified female breast: Secondary | ICD-10-CM

## 2013-01-13 LAB — COMPREHENSIVE METABOLIC PANEL (CC13)
Albumin: 3.3 g/dL — ABNORMAL LOW (ref 3.5–5.0)
Alkaline Phosphatase: 52 U/L (ref 40–150)
BUN: 16.7 mg/dL (ref 7.0–26.0)
Creatinine: 0.8 mg/dL (ref 0.6–1.1)
Glucose: 103 mg/dl — ABNORMAL HIGH (ref 70–99)
Potassium: 4 mEq/L (ref 3.5–5.1)
Total Bilirubin: 0.3 mg/dL (ref 0.20–1.20)

## 2013-01-13 LAB — CBC WITH DIFFERENTIAL/PLATELET
Basophils Absolute: 0 10*3/uL (ref 0.0–0.1)
Eosinophils Absolute: 0.1 10*3/uL (ref 0.0–0.5)
HGB: 12.2 g/dL (ref 11.6–15.9)
LYMPH%: 20.7 % (ref 14.0–49.7)
MCH: 30.8 pg (ref 25.1–34.0)
MCV: 92.4 fL (ref 79.5–101.0)
MONO%: 7 % (ref 0.0–14.0)
NEUT#: 3.2 10*3/uL (ref 1.5–6.5)
NEUT%: 70.1 % (ref 38.4–76.8)
Platelets: 148 10*3/uL (ref 145–400)

## 2013-01-20 ENCOUNTER — Encounter: Payer: Self-pay | Admitting: Physician Assistant

## 2013-01-20 ENCOUNTER — Ambulatory Visit (HOSPITAL_COMMUNITY)
Admission: RE | Admit: 2013-01-20 | Discharge: 2013-01-20 | Disposition: A | Discharge status: Home / Self Care | Payer: Medicare Other | Source: Ambulatory Visit | Attending: Physician Assistant | Admitting: Physician Assistant

## 2013-01-20 ENCOUNTER — Ambulatory Visit (HOSPITAL_BASED_OUTPATIENT_CLINIC_OR_DEPARTMENT_OTHER): Payer: Medicare Other | Admitting: Physician Assistant

## 2013-01-20 ENCOUNTER — Ambulatory Visit: Payer: Medicare Other | Admitting: Oncology

## 2013-01-20 ENCOUNTER — Telehealth: Payer: Self-pay | Admitting: Oncology

## 2013-01-20 VITALS — BP 158/78 | HR 70 | Temp 97.9°F | Resp 20 | Ht 60.0 in | Wt 171.3 lb

## 2013-01-20 DIAGNOSIS — M25579 Pain in unspecified ankle and joints of unspecified foot: Secondary | ICD-10-CM | POA: Insufficient documentation

## 2013-01-20 DIAGNOSIS — Z853 Personal history of malignant neoplasm of breast: Secondary | ICD-10-CM | POA: Insufficient documentation

## 2013-01-20 DIAGNOSIS — M79609 Pain in unspecified limb: Secondary | ICD-10-CM

## 2013-01-20 DIAGNOSIS — M79671 Pain in right foot: Secondary | ICD-10-CM

## 2013-01-20 DIAGNOSIS — M19079 Primary osteoarthritis, unspecified ankle and foot: Secondary | ICD-10-CM | POA: Insufficient documentation

## 2013-01-20 DIAGNOSIS — C50519 Malignant neoplasm of lower-outer quadrant of unspecified female breast: Secondary | ICD-10-CM

## 2013-01-20 DIAGNOSIS — M7730 Calcaneal spur, unspecified foot: Secondary | ICD-10-CM | POA: Insufficient documentation

## 2013-01-20 DIAGNOSIS — C50512 Malignant neoplasm of lower-outer quadrant of left female breast: Secondary | ICD-10-CM

## 2013-01-20 MED ORDER — TAMOXIFEN CITRATE 20 MG PO TABS: 20.0000 mg | ORAL_TABLET | Freq: Every day | ORAL | Status: DC

## 2013-01-20 NOTE — Progress Notes (Signed)
Hematology and Oncology Follow Up Visit  Briana Smith [id number] [birth year] 72 y.o. 01/20/2013    HPI: Briana Smith had screening mammography August 14, 2010 at the [facility] showing a possible mass in the left breast.  She was brought back for additional views November 22 and Dr. Deboraha Sprang was able to confirm an ill defined mass in the inferior portion of the left breast which was not palpable.  Ultrasound showed this to be irregularly marginated and to measure approximately 1 cm.  The left axilla was unremarkable.  Biopsy was performed the same day and showed ([id number]) an intermediate grade invasive ductal carcinoma which was estrogen receptor positive at 100%, progesterone receptor positive at 99% with an elevated MIB-1 at 44%, but no HER2/neu amplification by CISH with a ratio of 1.62.  Bilateral breast MRIs were obtained on September 04, 2010.  This confirmed a 1.7 cm irregularly marginated mass in the left breast consistent with the previously diagnosed cancer.  They could also see the clip from her prior benign left breast biopsy obtained in 2000.   However, to complicate things there was an area in the left and upper manubrium with abnormal signal intensity.  It was felt that a further evaluation would be helpful and the patient had a bone scan on December 7.  This showed a strong focus of uptake in the central upper sternum and a second one at the sternal manubrial junction slightly to the left.  There was also a symmetric increased uptake in the right proximal humerus and a smaller area of uptake again rather faint in the left proximal femur.  This was felt to be most consistent with metastatic disease.  The patient then saw Dr. Welton Flakes in our office and she discussed her disease in terms of stage IV breast cancer.   Dr. Welton Flakes set the patient up for a PET scan which was performed on December 12 with associated CT scan.  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  There was some fluid within the joints as well on MRI review.  Similarly, with the lesion in the sternomanubrial joints, there was an SUV maximum of 3.1 and significant sclerosis more consistent with arthropathy.  The increased uptake in the right proximal humerus could not be reproduced by CT or PET.  The breast primary had an SUV of 1.8 and measured 1.0 cm on these images.  There was no adenopathy in the axilla or chest and the rest of the CT/PET was negative except for an 8 mm right thyroid nodule which will require a work up at some point seen only the CT scan, not on the PET scan.  There was a nonhypermetabolic right hepatic lobe lesion felt to be most consistent with a complex cyst or hemangioma.  There was also hemangioma in the L3 vertebral body and in other, thoracic vertebrae.  Finally, there was an exophytic upper pole left renal lesion felt to be most likely a cyst.  The patient then sought a second opinion under Dr. Dossie Der at [facility] and she was seen there October 14, 2010.  Dr. Amie Critchley repeated the bone scan and PET scan and proceeded to obtain plain films of the left hip and proximal humerus. Those studies confirmed that the patient essentially she had some arthritis but no evidence of metastatic disease. The patient then proceeded to left partial mastectomy  with sentinel lymph node sampling October 23, 2010.  The results from that procedure ([id number]) showed a 1.2 cm grade 2 invasive ductal carcinoma with 1 of 2 sentinel lymph nodes involved.  Margins were negative.  There was evidence of lymphovascular invasion.  HER2/neu CISH was repeated and was again negative with the ratio of 1.58.  Briana Smith received adjuvant chemotherapy consisting of 4 cycles of docetaxel and cyclophosphamide. Chemotherapy was completed in May of 2012 and was followed by radiation therapy. Radiation was completed in mid August  2012, at which time patient began on tamoxifen, 20 mg daily. Her subsequent history is as detailed below.  Interim History:   Briana Smith returns today for routine followup of her left breast cancer. She continues on tamoxifen which she is tolerating well. Interval history is remarkable only for some recent pain in the right foot and ankle. She denies any signs of swelling in the foot and ankle, and denies any known injuries. It is severe enough, however, that it is affecting her day-to-day activities, and she is unable to exercise as before.  Review of Systems: Otherwise, Briana Smith is feeling well with no additional complaints. She's had no fevers, chills, night sweats, or hot flashes. She denies any rashes or skin changes. She's had no vaginal dryness, discharge, or bleeding, and in fact denies any signs of abnormal bleeding at all. She's eating and drinking well with no nausea or change in bowel or bladder habits. She's had no cough, shortness of breath, chest pain, or palpitations. She denies any abnormal headaches or dizziness.   A detailed review of systems is otherwise noncontributory.   FAMILY HISTORY:  The patient's father died at the age of 15 from COPD/emphysema in the setting of tobacco abuse.  The patient's mother died at the age of 83 in the setting of dementia.  The patient has one brother with a history of neck cancer.  There is no breast or ovarian cancer in the family to her knowledge.  GYNECOLOGIC HISTORY:  She is Gx, P3, menarche age 53 and again status post TAH-BSO in 44 after which she took hormone replacement for approximately 24 years.  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  Daughter Briana Smith lives in [place] and she works as a Teacher, early years/pre.  Daughter Briana Smith lives in [place] and works as a Firefighter Son Briana Smith lives in [place] and works for [employer].  The patient has three grandchildren.   She attends the [employer]  Medications:   I have reviewed the patient's current medications.  Current Outpatient Prescriptions  Medication Sig Dispense Refill  . aspirin 81 MG tablet Take 81 mg by mouth daily.      Marland Kitchen CALCIUM PO Take by mouth.        . Cholecalciferol (D-3-5 PO) Take by mouth.        Marland Kitchen lisinopril-hydrochlorothiazide (PRINZIDE,ZESTORETIC) 20-12.5 MG per tablet One half tab every morning  50 tablet  3  . MAGNESIUM PO Take by mouth daily.        . Multiple Vitamin (MULTIVITAMIN) tablet Take 1 tablet by mouth daily.        . potassium chloride (K-DUR) 10 MEQ tablet Take 10 mEq by mouth daily.      . tamoxifen (NOLVADEX) 20 MG tablet Take 1 tablet (20 mg total) by mouth daily.  90 tablet  1  . timolol (TIMOPTIC) 0.5 % ophthalmic solution  BID times 48H.       No current facility-administered medications for this visit.    Allergies:  Allergies  Allergen Reactions  . Peanut-Containing Drug Products     All nuts cause fever blisters    Physical Exam: Middle-aged white woman who appears well Filed Vitals:   01/20/13 1140  BP: 158/78  Pulse: 70  Temp: 97.9 F (36.6 C)  Resp: 20  Body mass index is 33.45 kg/(m^2). Filed Weights   01/20/13 1140  Weight: 171 lb 4.8 oz (77.701 kg)   ECOG:  0  HEENT:  Sclerae anicteric.  Oropharynx clear.  No mucositis or candidiasis.   Nodes:  No cervical, supraclavicular, or axillary lymphadenopathy palpated.  Breast Exam:  Right breast is unremarkable. Left breast is status post lumpectomy with no suspicious masses and no evidence of local recurrence. Lungs:  Clear to auscultation bilaterally.  No crackles, rhonchi, or wheezes.   Heart:  Regular rate and rhythm.   Abdomen:  Soft, nontender.  Positive bowel sounds. Musculoskeletal:  No focal spinal tenderness to palpation.  Extremities:  No peripheral edema. No visible abnormalities in the right foot and ankle.  Neuro:  Nonfocal. Well oriented with positive  affect.   Lab Results: Lab Results  Component Value Date   WBC 4.6 01/13/2013   HGB 12.2 01/13/2013   HCT 36.6 01/13/2013   MCV 92.4 01/13/2013   PLT 148 01/13/2013   NEUTROABS 3.2 01/13/2013     Chemistry      Component Value Date/Time   NA 138 01/13/2013 1101   NA 142 10/13/2012 0800   K 4.0 01/13/2013 1101   K 4.9 10/13/2012 0800   CL 104 01/13/2013 1101   CL 106 10/13/2012 0800   CO2 27 01/13/2013 1101   CO2 31 10/13/2012 0800   BUN 16.7 01/13/2013 1101   BUN 17 10/13/2012 0800   CREATININE 0.8 01/13/2013 1101   CREATININE 0.9 10/13/2012 0800      Component Value Date/Time   CALCIUM 9.0 01/13/2013 1101   CALCIUM 9.3 10/13/2012 0800   ALKPHOS 52 01/13/2013 1101   ALKPHOS 40 10/13/2012 0800   AST 18 01/13/2013 1101   AST 21 10/13/2012 0800   ALT 22 01/13/2013 1101   ALT 29 10/13/2012 0800   BILITOT 0.30 01/13/2013 1101   BILITOT 0.6 10/13/2012 0800        Radiological Studies: 08/18/2011 DIGITAL DIAGNOSTIC BILATERAL MAMMOGRAM WITH CAD  Comparison: 08/14/2010, 07/24/2009  Findings: The breast tissue is almost entirely fatty. Left  lumpectomy and radiation therapy changes are present. There is no  dominant mass, nonsurgical architectural distortion or  calcification to suggest malignancy.  Mammographic images were processed with CAD.  IMPRESSION:  No mammographic evidence of malignancy. Yearly diagnostic  mammography is suggested.  BI-RADS CATEGORY 2: Benign finding(s).  Original Report Authenticated By: Daryl Eastern, M.D.   Assessment:  72 y.o.  [place] woman   (1) status post left lumpectomy and sentinel lymph node sampling January 2012 for a pT1c pN1, stage IIB  invasive ductal carcinoma, grade 2,  strongly estrogen and progesterone receptor positive, HER2 negative, MIB-1 of 44%,  (2) treated adjuvantly with docetaxel and cyclophosphamide x4 completed May 2012   (3) radiation completed August 2012.   (4) began on tamoxifen in August 2012  Plan:  As far as the breast  cancer goes, Briana Smith is doing very well and will continue on the tamoxifen.  She will be at the 2-year mark in August and will see  Dr. Darnelle Smith in September to decide whether or not to continue on tamoxifen or switch to an aromatase inhibitor.    I am sending her for an x-ray of the right foot and ankle for further evaluation of the recent onset of pain. It necessary, we will refer her for an orthopedic evaluation depending on those results.  Nadeen voices understanding and agreement with our plan, she will call with any changes or problems prior to her next appointment.   Zollie Scale, MD 01/20/2013

## 2013-03-09 ENCOUNTER — Telehealth: Payer: Self-pay | Admitting: *Deleted

## 2013-03-09 NOTE — Telephone Encounter (Signed)
sw pt informed her that [facility] will be in clinic all day on 06/29/13. gv appt d/t for 06/30/13@11am . Pt is aware that i will also mail a letter/cal as well....td

## 2013-06-16 ENCOUNTER — Other Ambulatory Visit: Payer: Self-pay | Admitting: *Deleted

## 2013-06-16 DIAGNOSIS — I10 Essential (primary) hypertension: Secondary | ICD-10-CM

## 2013-06-16 MED ORDER — LISINOPRIL-HYDROCHLOROTHIAZIDE 20-12.5 MG PO TABS: ORAL_TABLET | ORAL | Status: DC

## 2013-06-22 ENCOUNTER — Other Ambulatory Visit (HOSPITAL_BASED_OUTPATIENT_CLINIC_OR_DEPARTMENT_OTHER): Payer: Medicare Other

## 2013-06-22 DIAGNOSIS — C50512 Malignant neoplasm of lower-outer quadrant of left female breast: Secondary | ICD-10-CM

## 2013-06-22 DIAGNOSIS — C50519 Malignant neoplasm of lower-outer quadrant of unspecified female breast: Secondary | ICD-10-CM

## 2013-06-22 LAB — CBC WITH DIFFERENTIAL/PLATELET
BASO%: 1 % (ref 0.0–2.0)
Basophils Absolute: 0 10*3/uL (ref 0.0–0.1)
EOS%: 1.8 % (ref 0.0–7.0)
HGB: 12.1 g/dL (ref 11.6–15.9)
MCH: 30.8 pg (ref 25.1–34.0)
MCHC: 33.4 g/dL (ref 31.5–36.0)
RDW: 13 % (ref 11.2–14.5)
WBC: 4.3 10*3/uL (ref 3.9–10.3)
lymph#: 1 10*3/uL (ref 0.9–3.3)

## 2013-06-22 LAB — COMPREHENSIVE METABOLIC PANEL (CC13)
ALT: 19 U/L (ref 0–55)
AST: 17 U/L (ref 5–34)
Albumin: 3.4 g/dL — ABNORMAL LOW (ref 3.5–5.0)
Calcium: 8.9 mg/dL (ref 8.4–10.4)
Chloride: 106 mEq/L (ref 98–109)
Potassium: 4.6 mEq/L (ref 3.5–5.1)

## 2013-06-29 ENCOUNTER — Ambulatory Visit: Payer: Medicare Other | Admitting: Oncology

## 2013-06-30 ENCOUNTER — Ambulatory Visit (HOSPITAL_BASED_OUTPATIENT_CLINIC_OR_DEPARTMENT_OTHER): Payer: Medicare Other | Admitting: Oncology

## 2013-06-30 VITALS — BP 135/79 | HR 66 | Temp 98.6°F | Resp 18 | Ht 60.0 in | Wt 172.6 lb

## 2013-06-30 DIAGNOSIS — C50519 Malignant neoplasm of lower-outer quadrant of unspecified female breast: Secondary | ICD-10-CM

## 2013-06-30 DIAGNOSIS — C50512 Malignant neoplasm of lower-outer quadrant of left female breast: Secondary | ICD-10-CM

## 2013-06-30 DIAGNOSIS — Z853 Personal history of malignant neoplasm of breast: Secondary | ICD-10-CM | POA: Insufficient documentation

## 2013-06-30 NOTE — Progress Notes (Signed)
Hematology and Oncology Follow Up Visit  Briana Smith [id number] 1941-04-13 72 y.o. 06/30/2013   PCP: Evette Georges, MD GYN: SU:  OTHER MD: Urban Gibson, Nita Sells, Kandice Hams  HPI: Briana Smith had screening mammography August 14, 2010 at the [facility] showing a possible mass in the left breast.  She was brought back for additional views November 22 and Dr. Deboraha Sprang was able to confirm an ill defined mass in the inferior portion of the left breast which was not palpable.  Ultrasound showed this to be irregularly marginated and to measure approximately 1 cm.  The left axilla was unremarkable.  Biopsy was performed the same day and showed ([id number]) an intermediate grade invasive ductal carcinoma which was estrogen receptor positive at 100%, progesterone receptor positive at 99% with an elevated MIB-1 at 44%, but no HER2/neu amplification by CISH with a ratio of 1.62.  Bilateral breast MRIs were obtained on September 04, 2010.  This confirmed a 1.7 cm irregularly marginated mass in the left breast consistent with the previously diagnosed cancer.  They could also see the clip from her prior benign left breast biopsy obtained in 2000.   However, to complicate things there was an area in the left and upper manubrium with abnormal signal intensity.  It was felt that a further evaluation would be helpful and the patient had a bone scan on December 7.  This showed a strong focus of uptake in the central upper sternum and a second one at the sternal manubrial junction slightly to the left.  There was also a symmetric increased uptake in the right proximal humerus and a smaller area of uptake again rather faint in the left proximal femur.  This was felt to be most consistent with metastatic disease.  The patient then saw Dr. Welton Flakes in our office and she discussed her disease in terms of stage IV breast cancer.   Dr. Welton Flakes set the patient up for a PET scan which was performed on December 12 with  associated CT scan.  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  There was some fluid within the joints as well on MRI review.  Similarly, with the lesion in the sternomanubrial joints, there was an SUV maximum of 3.1 and significant sclerosis more consistent with arthropathy.  The increased uptake in the right proximal humerus could not be reproduced by CT or PET.  The breast primary had an SUV of 1.8 and measured 1.0 cm on these images.  There was no adenopathy in the axilla or chest and the rest of the CT/PET was negative except for an 8 mm right thyroid nodule which will require a work up at some point seen only the CT scan, not on the PET scan.  There was a nonhypermetabolic right hepatic lobe lesion felt to be most consistent with a complex cyst or hemangioma.  There was also hemangioma in the L3 vertebral body and in other, thoracic vertebrae.  Finally, there was an exophytic upper pole left renal lesion felt to be most likely a cyst.  The patient then sought a second opinion under Dr. Dossie Der at [facility] and she was seen there October 14, 2010.  Dr. Amie Critchley repeated the bone scan and PET scan and proceeded to obtain plain films of the left hip and proximal humerus. Those studies confirmed that the patient essentially she had some  arthritis but no evidence of metastatic disease. The patient then proceeded to left partial mastectomy with sentinel lymph node sampling October 23, 2010.  The results from that procedure ([id number]) showed a 1.2 cm grade 2 invasive ductal carcinoma with 1 of 2 sentinel lymph nodes involved.  Margins were negative.  There was evidence of lymphovascular invasion.  HER2/neu CISH was repeated and was again negative with the ratio of 1.58.  Briana Smith received adjuvant chemotherapy consisting of 4 cycles of docetaxel and cyclophosphamide. Chemotherapy was completed  in May of 2012 and was followed by radiation therapy. Radiation was completed in mid August 2012, at which time patient began on tamoxifen, 20 mg daily. Her subsequent history is as detailed below.  Interim History:   Briana Smith returns today for followup of her breast cancer history is remarkable. She is tolerating the tamoxifen with no hot flashes, minimal vaginal wetness, and really no other side effects.  Review of Systems: She is seeing orthopedics for the ankle problems and she has been told she has he'll spurs. She doesn't like to exercise, but she has a full gym at home number and she is going through [employer], "and there making me do it". Otherwise a detailed review of systems today was entirely stable   FAMILY HISTORY:  The patient's father died at the age of 17 from COPD/emphysema in the setting of tobacco abuse.  The patient's mother died at the age of 22 in the setting of dementia.  The patient has one brother with a history of neck cancer.  There is no breast or ovarian cancer in the family to her knowledge.  GYNECOLOGIC HISTORY:  She is Gx, P3, menarche age 33 and again status post TAH-BSO in 66 after which she took hormone replacement for approximately 24 years.  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  Daughter Briana Smith lives in [place] and she works as a Teacher, early years/pre.  Daughter Briana Smith lives in [place] and works as a Firefighter Son Briana Smith lives in [place] and works for [employer].  The patient has three grandchildren.  She attends the [employer].  Medications:   Current Outpatient Prescriptions  Medication Sig Dispense Refill  . aspirin 81 MG tablet Take 81 mg by mouth daily.      Marland Kitchen CALCIUM PO Take by mouth.        . Cholecalciferol (D-3-5 PO) Take by mouth.        Marland Kitchen lisinopril-hydrochlorothiazide (PRINZIDE,ZESTORETIC) 20-12.5 MG per tablet One half tab every morning  50  tablet  3  . MAGNESIUM PO Take by mouth daily.        . Multiple Vitamin (MULTIVITAMIN) tablet Take 1 tablet by mouth daily.        . potassium chloride (K-DUR) 10 MEQ tablet Take 10 mEq by mouth daily.      . tamoxifen (NOLVADEX) 20 MG tablet Take 1 tablet (20 mg total) by mouth daily.  90 tablet  1  . timolol (TIMOPTIC) 0.5 % ophthalmic solution BID times 48H.       No current facility-administered medications for this visit.    Allergies:  Allergies  Allergen Reactions  . Peanut-Containing Drug Products     All nuts cause fever blisters    Physical Exam: Middle-aged white woman in no acute distress Filed Vitals:   06/30/13 1108  BP: 135/79  Pulse: 66  Temp: 98.6 F (37  C)  Resp: 18  Body mass index is 33.71 kg/(m^2). Filed Weights   06/30/13 1108  Weight: 172 lb 9.6 oz (78.291 kg)   ECOG:  0  Sclerae unicteric Oropharynx clear No cervical or supraclavicular adenopathy Lungs no rales or rhonchi Heart regular rate and rhythm Abd soft, obese, nontender, positive bowel sounds MSK no focal spinal tenderness, no peripheral edema Neuro: nonfocal, well oriented, appropriate affect Breasts: The right breast is unremarkable; the left breast is status post lumpectomy and radiation: There is no evidence of local recurrence. Left axilla is benign     Lab Results: Lab Results  Component Value Date   WBC 4.3 06/22/2013   HGB 12.1 06/22/2013   HCT 36.0 06/22/2013   MCV 92.2 06/22/2013   PLT 158 06/22/2013   NEUTROABS 2.9 06/22/2013     Chemistry      Component Value Date/Time   NA 139 06/22/2013 1046   NA 142 10/13/2012 0800   K 4.6 06/22/2013 1046   K 4.9 10/13/2012 0800   CL 104 01/13/2013 1101   CL 106 10/13/2012 0800   CO2 28 06/22/2013 1046   CO2 31 10/13/2012 0800   BUN 14.8 06/22/2013 1046   BUN 17 10/13/2012 0800   CREATININE 0.8 06/22/2013 1046   CREATININE 0.9 10/13/2012 0800      Component Value Date/Time   CALCIUM 8.9 06/22/2013 1046   CALCIUM 9.3 10/13/2012 0800    ALKPHOS 47 06/22/2013 1046   ALKPHOS 40 10/13/2012 0800   AST 17 06/22/2013 1046   AST 21 10/13/2012 0800   ALT 19 06/22/2013 1046   ALT 29 10/13/2012 0800   BILITOT 0.28 06/22/2013 1046   BILITOT 0.6 10/13/2012 0800        Radiological Studies: Next mammogram due NOV 2013  Clinical Data: 72 year old post menopausal Caucasian female. No  fracture history. Currently on calcium and vitamin D replacement.  Personal history of breast cancer with chemotherapy. History of  hormone replacement therapy which was discontinued in November  2011.  DUAL X-RAY ABSORPTIOMETRY (DXA) FOR BONE MINERAL DENSITY  AP LUMBAR SPINE L1-L4  Bone Mineral Density (BMD): 0.846 g/cm2  Young Adult T Score: -1.8  Z Score: 0.4  LEFT FEMUR NECK  Bone Mineral Density (BMD): 0.683 g/cm2  Young Adult T Score: -1.5  Z Score: 0.4  ASSESSMENT: Patient's diagnostic category is LOW BONE MASS by WHO  Criteria.    Assessment:  72 y.o.  [place] woman   (1) status post left lumpectomy and sentinel lymph node sampling January 2012 for a pT1c pN1, stage IIB  invasive ductal carcinoma, grade 2,  strongly estrogen and progesterone receptor positive, HER2 negative, MIB-1 of 44%,  (2) treated adjuvantly with docetaxel and cyclophosphamide x4 completed May 2012   (3) radiation completed August 2012.   (4) began on tamoxifen in August 2012  Plan:  Armoni is doing terrific as far as her breast cancer is concerned. She has the choice of continuing tamoxifen and other 8 years, or switching to an aromatase inhibitor and stopping after 3 more years. She very much would prefer to take the "five-year program", so today we discussed the possible toxicities, side effects and complications of aromatase inhibitors. In particular she was advised to walk or otherwise exercise 30-45 minutes 5 days a week, and of course continue her vitamin D, since the main concern with anastrozole is the possibility of accelerating bone loss.  She will have  her next mammogram in November and I am going  to obtain a repeat DEXA scan at that time. She will stop tamoxifen December 1 and start anastrozole January 1. She will see me again in March of 2015. She is tolerating the anastrozole well at that time we will start once a year followup then.  She knows to call for any problems that may develop before the next visit here.  Lowella Dell, MD 06/30/2013

## 2013-07-01 ENCOUNTER — Other Ambulatory Visit: Payer: Self-pay | Admitting: Oncology

## 2013-07-01 DIAGNOSIS — C50919 Malignant neoplasm of unspecified site of unspecified female breast: Secondary | ICD-10-CM

## 2013-07-06 ENCOUNTER — Telehealth: Payer: Self-pay | Admitting: Oncology

## 2013-07-06 NOTE — Telephone Encounter (Signed)
, °

## 2013-08-19 ENCOUNTER — Telehealth: Payer: Self-pay | Admitting: *Deleted

## 2013-08-19 MED ORDER — ANASTROZOLE 1 MG PO TABS: 1.0000 mg | ORAL_TABLET | Freq: Every day | ORAL | Status: DC

## 2013-08-19 NOTE — Telephone Encounter (Signed)
Patient calling in to have Anastrozole called in so she can go ahead and pick up. Only request 30days in the event she does not tolerate this. If she does, she will then request next fill of 90days.

## 2013-08-30 ENCOUNTER — Other Ambulatory Visit: Payer: Medicare Other

## 2013-08-31 ENCOUNTER — Ambulatory Visit
Admission: RE | Admit: 2013-08-31 | Discharge: 2013-08-31 | Disposition: A | Discharge status: Home / Self Care | Payer: Medicare Other | Source: Ambulatory Visit | Attending: Oncology | Admitting: Oncology

## 2013-08-31 DIAGNOSIS — C50919 Malignant neoplasm of unspecified site of unspecified female breast: Secondary | ICD-10-CM

## 2013-10-19 ENCOUNTER — Encounter (INDEPENDENT_AMBULATORY_CARE_PROVIDER_SITE_OTHER): Payer: Self-pay | Admitting: General Surgery

## 2013-10-19 ENCOUNTER — Ambulatory Visit (INDEPENDENT_AMBULATORY_CARE_PROVIDER_SITE_OTHER): Payer: Medicare HMO | Admitting: General Surgery

## 2013-10-19 ENCOUNTER — Ambulatory Visit (INDEPENDENT_AMBULATORY_CARE_PROVIDER_SITE_OTHER): Payer: Medicare Other | Admitting: General Surgery

## 2013-10-19 VITALS — BP 126/80 | HR 76 | Temp 97.9°F | Resp 14 | Ht 60.5 in | Wt 168.4 lb

## 2013-10-19 DIAGNOSIS — C50519 Malignant neoplasm of lower-outer quadrant of unspecified female breast: Secondary | ICD-10-CM

## 2013-10-19 DIAGNOSIS — C50512 Malignant neoplasm of lower-outer quadrant of left female breast: Secondary | ICD-10-CM

## 2013-10-19 NOTE — Patient Instructions (Signed)
Your recent mammograms in November are normal.  Examination of your breasts and all lymph node areas today is normal. There is no evidence of cancer.  Continue your arimidex medication and keep all of your regular appointments with Dr. Jana Hakim.  Be sure to get an annual physical exam with Dr. Christie Nottingham.  Return to see Dr. Dalbert Batman in one year.

## 2013-10-19 NOTE — Progress Notes (Signed)
Patient ID: Briana Smith, female   DOB: August 28, 1941, 74 y.o.   MRN: [id number]  History:  This patient returns for long-term followup regarding her left breast cancer. On 10/23/2010 she underwent left partial mastectomy and sentinel node biopsy. Pathologic stage T1c, N1, receptor-positive, HER-2 negative, Ki-67 44%.  I placed a Port-A-Cath in March of 2012. She received adjuvant chemotherapy and then adjuvant radiation therapy. She has done well. She feels fine. No recurrence to date.  She takes tamoxifen and sees Dr. Jana Hakim every 6 months.  Bilateral mammograms on 08/31/2013 looked fine, no focal abnormality, BI-RADS category 2 .  ROS,FH,PH,SH - Reviewed. Unchanged. Noncontributory except as above.  10 system review of systems negative except as described above.   Exam:  Patient looks well. No distress. Good spirits.  Neck no adenopathy or mass. No jugular venous distention  Lungs clear to auscultation bilaterally.Old port site right infraclavicular area looks good.  Heart regular rate and rhythm. No murmur. No ectopy.  Breasts medium size. Soft. Radially oriented lumpectomy incision at 6:00 position left breast well healed. Excellent contour.Left axillary incision well-healed. No palpable mass in either breast. No other skin changes. No axillary mass.   Assessment  invasive cancer left breast, 6:00 position, pathologic stage T1c, N1, receptor positive, HER-2-negative.  No evidence of recurrence 3 years following left partial mastectomy, SLN biopsy, adjuvant chemotherapy, adjuvant radiation therapy   Plan: Continue arimidex  Continue followup Dr. Jana Hakim  Repeat mammograms in 1 year  Return to see me in one year after mammograms.     Briana Smith. Briana Smith, M.D., [facility]  General and Minimally invasive Surgery  Breast and Colorectal Surgery  Office: [phone]  Pager: [phone]

## 2013-11-08 ENCOUNTER — Other Ambulatory Visit (INDEPENDENT_AMBULATORY_CARE_PROVIDER_SITE_OTHER): Payer: Medicare HMO

## 2013-11-08 DIAGNOSIS — Z0000 Encounter for general adult medical examination without abnormal findings: Secondary | ICD-10-CM

## 2013-11-08 DIAGNOSIS — I10 Essential (primary) hypertension: Secondary | ICD-10-CM

## 2013-11-08 LAB — CBC WITH DIFFERENTIAL/PLATELET
BASOS ABS: 0 10*3/uL (ref 0.0–0.1)
Basophils Relative: 0.9 % (ref 0.0–3.0)
Eosinophils Absolute: 0.1 10*3/uL (ref 0.0–0.7)
Eosinophils Relative: 2 % (ref 0.0–5.0)
HEMATOCRIT: 38.5 % (ref 36.0–46.0)
HEMOGLOBIN: 12.6 g/dL (ref 12.0–15.0)
LYMPHS ABS: 0.9 10*3/uL (ref 0.7–4.0)
Lymphocytes Relative: 22.7 % (ref 12.0–46.0)
MCHC: 32.6 g/dL (ref 30.0–36.0)
MCV: 95.4 fl (ref 78.0–100.0)
MONO ABS: 0.4 10*3/uL (ref 0.1–1.0)
MONOS PCT: 10.1 % (ref 3.0–12.0)
NEUTROS ABS: 2.4 10*3/uL (ref 1.4–7.7)
Neutrophils Relative %: 64.3 % (ref 43.0–77.0)
PLATELETS: 168 10*3/uL (ref 150.0–400.0)
RBC: 4.03 Mil/uL (ref 3.87–5.11)
RDW: 13.4 % (ref 11.5–14.6)
WBC: 3.8 10*3/uL — ABNORMAL LOW (ref 4.5–10.5)

## 2013-11-08 LAB — BASIC METABOLIC PANEL
BUN: 17 mg/dL (ref 6–23)
CHLORIDE: 106 meq/L (ref 96–112)
CO2: 29 meq/L (ref 19–32)
Calcium: 9.7 mg/dL (ref 8.4–10.5)
Creatinine, Ser: 0.8 mg/dL (ref 0.4–1.2)
GFR: 70.26 mL/min (ref >60.00)
GLUCOSE: 87 mg/dL (ref 70–99)
POTASSIUM: 4.9 meq/L (ref 3.5–5.1)
SODIUM: 141 meq/L (ref 135–145)

## 2013-11-08 LAB — POCT URINALYSIS DIPSTICK
Bilirubin, UA: NEGATIVE
GLUCOSE UA: NEGATIVE
Ketones, UA: NEGATIVE
NITRITE UA: NEGATIVE
PH UA: 6
Protein, UA: NEGATIVE
Spec Grav, UA: 1.02
UROBILINOGEN UA: 0.2

## 2013-11-08 LAB — LIPID PANEL
Cholesterol: 217 mg/dL — ABNORMAL HIGH (ref 0–200)
HDL: 60.4 mg/dL (ref >39.00)
Total CHOL/HDL Ratio: 4
Triglycerides: 81 mg/dL (ref 0.0–149.0)
VLDL: 16.2 mg/dL (ref 0.0–40.0)

## 2013-11-08 LAB — TSH: TSH: 4.86 u[IU]/mL (ref 0.35–5.50)

## 2013-11-08 LAB — LDL CHOLESTEROL, DIRECT: LDL DIRECT: 141.7 mg/dL

## 2013-11-08 LAB — HEPATIC FUNCTION PANEL
ALBUMIN: 3.8 g/dL (ref 3.5–5.2)
ALT: 21 U/L (ref 0–35)
AST: 20 U/L (ref 0–37)
Alkaline Phosphatase: 47 U/L (ref 39–117)
BILIRUBIN TOTAL: 0.6 mg/dL (ref 0.3–1.2)
Bilirubin, Direct: 0 mg/dL (ref 0.0–0.3)
Total Protein: 6.9 g/dL (ref 6.0–8.3)

## 2013-11-15 ENCOUNTER — Encounter: Payer: Self-pay | Admitting: Family Medicine

## 2013-11-15 ENCOUNTER — Ambulatory Visit (INDEPENDENT_AMBULATORY_CARE_PROVIDER_SITE_OTHER): Payer: Medicare HMO | Admitting: Family Medicine

## 2013-11-15 VITALS — BP 120/70 | Temp 98.4°F | Ht 60.25 in | Wt 170.0 lb

## 2013-11-15 DIAGNOSIS — I10 Essential (primary) hypertension: Secondary | ICD-10-CM

## 2013-11-15 DIAGNOSIS — Z23 Encounter for immunization: Secondary | ICD-10-CM

## 2013-11-15 DIAGNOSIS — Z2911 Encounter for prophylactic immunotherapy for respiratory syncytial virus (RSV): Secondary | ICD-10-CM

## 2013-11-15 DIAGNOSIS — Z85828 Personal history of other malignant neoplasm of skin: Secondary | ICD-10-CM

## 2013-11-15 DIAGNOSIS — C50512 Malignant neoplasm of lower-outer quadrant of left female breast: Secondary | ICD-10-CM

## 2013-11-15 DIAGNOSIS — Z0000 Encounter for general adult medical examination without abnormal findings: Secondary | ICD-10-CM

## 2013-11-15 DIAGNOSIS — C50519 Malignant neoplasm of lower-outer quadrant of unspecified female breast: Secondary | ICD-10-CM

## 2013-11-15 MED ORDER — LISINOPRIL-HYDROCHLOROTHIAZIDE 20-12.5 MG PO TABS: ORAL_TABLET | ORAL | Status: DC

## 2013-11-15 NOTE — Progress Notes (Signed)
Pre visit review using our clinic review tool, if applicable. No additional management support is needed unless otherwise documented below in the visit note. 

## 2013-11-15 NOTE — Patient Instructions (Signed)
Continue your current medications  Remember sunscreens SPF 50+ a hat  Return in one year for general medical exam sooner if any problems

## 2013-11-15 NOTE — Progress Notes (Signed)
   Subjective:    Patient ID: Briana Smith, female    DOB: April 07, 1941, 73 y.o.   MRN: [id number]  HPI  Briana Smith is a 73 year old female nonsmoker who comes in today for a Medicare wellness examination because of a history of breast cancer, hypertension, and glaucoma  She takes arimidex 1 mg daily because of a history of breast cancer. She works out of her home gym 45 minutes daily  She takes Zestoretic 20-12.5 daily for hypertension BP today 120/70  She is on eyedrops for glaucoma  She sees her surgeon Dr. in every year and her oncologist every 6 months. She says she feels well and has no complaints.  Cognitive function normal she exercises on a daily basis home health safety reviewed no issues identified, no guns in the house, she does have a health care power of attorney and living well  She gets routine eye care, dental care, BSE monthly, and you mammography, colonoscopy 2009 normal, vaccinations updated by Apolonio Schneiders  Review of Systems  Constitutional: Negative.   HENT: Negative.   Eyes: Negative.   Respiratory: Negative.   Cardiovascular: Negative.   Gastrointestinal: Negative.   Endocrine: Negative.   Genitourinary: Negative.   Musculoskeletal: Negative.   Allergic/Immunologic: Negative.   Neurological: Negative.   Hematological: Negative.   Psychiatric/Behavioral: Negative.        Objective:   Physical Exam  Nursing note and vitals reviewed. Constitutional: She is oriented to person, place, and time. She appears well-developed and well-nourished.  HENT:  Head: Normocephalic and atraumatic.  Right Ear: External ear normal.  Left Ear: External ear normal.  Nose: Nose normal.  Mouth/Throat: Oropharynx is clear and moist.  Eyes: EOM are normal. Pupils are equal, round, and reactive to light.  Neck: Normal range of motion. Neck supple. No thyromegaly present.  Cardiovascular: Normal rate, regular rhythm, normal heart sounds and intact distal pulses.  Exam reveals no  gallop and no friction rub.   No murmur heard. Pulmonary/Chest: Effort normal and breath sounds normal.  Abdominal: Soft. Bowel sounds are normal. She exhibits no distension and no mass. There is no tenderness. There is no rebound.  Genitourinary:  Bilateral breast exam.......Marland Kitchen right breast normal........ left breast tattoos and radiation changes otherwise normal  Musculoskeletal: Normal range of motion.  Lymphadenopathy:    She has no cervical adenopathy.  Neurological: She is alert and oriented to person, place, and time. She has normal reflexes. No cranial nerve deficit. She exhibits normal muscle tone. Coordination normal.  Skin: Skin is warm and dry.  Total body skin exam normal except for scars from previous skin cancers that were removed  Psychiatric: She has a normal mood and affect. Her behavior is normal. Judgment and thought content normal.          Assessment & Plan:  Hypertension at goal continue current therapy  History of skin cancer continue sunscreens etc.  History of breast cancer followup by oncology and general surgery as outlined  History of glaucoma continue eyedrops from ophthalmologist

## 2013-11-16 ENCOUNTER — Telehealth: Payer: Self-pay | Admitting: Family Medicine

## 2013-11-16 NOTE — Telephone Encounter (Signed)
Relevant patient education mailed to patient.  

## 2013-12-21 ENCOUNTER — Telehealth: Payer: Self-pay | Admitting: *Deleted

## 2013-12-21 NOTE — Telephone Encounter (Signed)
Per Amy M - need to see if pt can see Dr. Owens Loffler same day instead of Dr. Jana Hakim.  Called and spoke with pt and she is fine with that.  Confirmed 12/29/13 at 1pm appt w/ pt to see Dr. Owens Loffler.

## 2013-12-28 ENCOUNTER — Other Ambulatory Visit: Payer: Self-pay | Admitting: *Deleted

## 2013-12-28 DIAGNOSIS — C50512 Malignant neoplasm of lower-outer quadrant of left female breast: Secondary | ICD-10-CM

## 2013-12-29 ENCOUNTER — Other Ambulatory Visit (HOSPITAL_BASED_OUTPATIENT_CLINIC_OR_DEPARTMENT_OTHER): Payer: Medicare HMO

## 2013-12-29 ENCOUNTER — Telehealth: Payer: Self-pay | Admitting: Hematology and Oncology

## 2013-12-29 ENCOUNTER — Ambulatory Visit (HOSPITAL_BASED_OUTPATIENT_CLINIC_OR_DEPARTMENT_OTHER): Payer: Medicare HMO | Admitting: Hematology and Oncology

## 2013-12-29 VITALS — BP 98/61 | HR 60 | Temp 98.6°F | Resp 18 | Ht 60.25 in | Wt 170.7 lb

## 2013-12-29 DIAGNOSIS — C50512 Malignant neoplasm of lower-outer quadrant of left female breast: Secondary | ICD-10-CM

## 2013-12-29 DIAGNOSIS — C50519 Malignant neoplasm of lower-outer quadrant of unspecified female breast: Secondary | ICD-10-CM

## 2013-12-29 DIAGNOSIS — N289 Disorder of kidney and ureter, unspecified: Secondary | ICD-10-CM

## 2013-12-29 LAB — CBC WITH DIFFERENTIAL/PLATELET
BASO%: 1 % (ref 0.0–2.0)
BASOS ABS: 0 10*3/uL (ref 0.0–0.1)
EOS ABS: 0.1 10*3/uL (ref 0.0–0.5)
EOS%: 1.4 % (ref 0.0–7.0)
HEMATOCRIT: 36.7 % (ref 34.8–46.6)
HEMOGLOBIN: 12.1 g/dL (ref 11.6–15.9)
LYMPH#: 1 10*3/uL (ref 0.9–3.3)
LYMPH%: 20.4 % (ref 14.0–49.7)
MCH: 30.5 pg (ref 25.1–34.0)
MCHC: 32.9 g/dL (ref 31.5–36.0)
MCV: 92.6 fL (ref 79.5–101.0)
MONO#: 0.4 10*3/uL (ref 0.1–0.9)
MONO%: 7.6 % (ref 0.0–14.0)
NEUT#: 3.4 10*3/uL (ref 1.5–6.5)
NEUT%: 69.6 % (ref 38.4–76.8)
PLATELETS: 174 10*3/uL (ref 145–400)
RBC: 3.97 10*6/uL (ref 3.70–5.45)
RDW: 13.3 % (ref 11.2–14.5)
WBC: 4.8 10*3/uL (ref 3.9–10.3)

## 2013-12-29 LAB — COMPREHENSIVE METABOLIC PANEL (CC13)
ALT: 19 U/L (ref 0–55)
ANION GAP: 9 meq/L (ref 3–11)
AST: 18 U/L (ref 5–34)
Albumin: 3.9 g/dL (ref 3.5–5.0)
Alkaline Phosphatase: 61 U/L (ref 40–150)
BUN: 16.2 mg/dL (ref 7.0–26.0)
CALCIUM: 10 mg/dL (ref 8.4–10.4)
CHLORIDE: 103 meq/L (ref 98–109)
CO2: 29 meq/L (ref 22–29)
CREATININE: 0.8 mg/dL (ref 0.6–1.1)
GLUCOSE: 91 mg/dL (ref 70–140)
Potassium: 4.8 mEq/L (ref 3.5–5.1)
Sodium: 141 mEq/L (ref 136–145)
Total Bilirubin: 0.4 mg/dL (ref 0.20–1.20)
Total Protein: 7.1 g/dL (ref 6.4–8.3)

## 2013-12-29 NOTE — Telephone Encounter (Signed)
gve the pt her April 2015 appt calendar. Pt is aware that she will be contacted with the mri appt.

## 2013-12-30 ENCOUNTER — Encounter: Payer: Self-pay | Admitting: Hematology and Oncology

## 2013-12-30 ENCOUNTER — Telehealth: Payer: Self-pay

## 2013-12-30 ENCOUNTER — Other Ambulatory Visit: Payer: Self-pay

## 2013-12-30 ENCOUNTER — Other Ambulatory Visit: Payer: Self-pay | Admitting: Hematology and Oncology

## 2013-12-30 NOTE — Progress Notes (Signed)
.. Hematology and Oncology Follow Up Visit  CHANEKA TREFZ [id number] Dec 05, 1940 73 y.o. 12/30/2013   PCP: Joycelyn Man, MD GYN: SU:  OTHER MD: Carmon Sails, [name], Ethelene Hal  HPI: Shakeela had screening mammography August 14, 2010 at the [facility] showing a possible mass in the left breast.  She was brought back for additional views November 22 and Dr. Sadie Haber was able to confirm an ill defined mass in the inferior portion of the left breast which was not palpable.  Ultrasound showed this to be irregularly marginated and to measure approximately 1 cm.  The left axilla was unremarkable.  Biopsy was performed the same day and showed ([id number]) an intermediate grade invasive ductal carcinoma which was estrogen receptor positive at 100%, progesterone receptor positive at 99% with an elevated MIB-1 at 44%, but no HER2/neu amplification by CISH with a ratio of 1.62.  Bilateral breast MRIs were obtained on September 04, 2010.  This confirmed a 1.7 cm irregularly marginated mass in the left breast consistent with the previously diagnosed cancer.  They could also see the clip from her prior benign left breast biopsy obtained in 2000.   However, to complicate things there was an area in the left and upper manubrium with abnormal signal intensity.  It was felt that a further evaluation would be helpful and the patient had a bone scan on December 7.  This showed a strong focus of uptake in the central upper sternum and a second one at the sternal manubrial junction slightly to the left.  There was also a symmetric increased uptake in the right proximal humerus and a smaller area of uptake again rather faint in the left proximal femur.  This was felt to be most consistent with metastatic disease.  The patient then saw Dr. Humphrey Rolls in our office and she discussed her disease in terms of stage IV breast cancer.   Dr. Humphrey Rolls set the patient up for a PET scan which was performed on December 12 with  associated CT scan.  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  There was some fluid within the joints as well on MRI review.  Similarly, with the lesion in the sternomanubrial joints, there was an SUV maximum of 3.1 and significant sclerosis more consistent with arthropathy.  The increased uptake in the right proximal humerus could not be reproduced by CT or PET.  The breast primary had an SUV of 1.8 and measured 1.0 cm on these images.  There was no adenopathy in the axilla or chest and the rest of the CT/PET was negative except for an 8 mm right thyroid nodule which will require a work up at some point seen only the CT scan, not on the PET scan.  There was a nonhypermetabolic right hepatic lobe lesion felt to be most consistent with a complex cyst or hemangioma.  There was also hemangioma in the L3 vertebral body and in other, thoracic vertebrae.  Finally, there was an exophytic upper pole left renal lesion felt to be most likely a cyst.  The patient then sought a second opinion under Dr. Gypsy Balsam at [facility] and she was seen there October 14, 2010.  Dr. Philipp Ovens repeated the bone scan and PET scan and proceeded to obtain plain films of the left hip and proximal humerus. Those studies confirmed that the patient essentially she had  some arthritis but no evidence of metastatic disease. The patient then proceeded to left partial mastectomy with sentinel lymph node sampling October 23, 2010.  The results from that procedure ([id number]) showed a 1.2 cm grade 2 invasive ductal carcinoma with 1 of 2 sentinel lymph nodes involved.  Margins were negative.  There was evidence of lymphovascular invasion.  HER2/neu CISH was repeated and was again negative with the ratio of 1.58.  August received adjuvant chemotherapy consisting of 4 cycles of docetaxel and cyclophosphamide. Chemotherapy was completed  in May of 2012 and was followed by radiation therapy. Radiation was completed in mid August 2012, at which time patient began on tamoxifen, 20 mg daily. Her subsequent history is as detailed below.  Interim History:   Patient returns for followup of her breast cancer.she complains of bone aching.She states her hands are going to sleep and are very sore when bending.She denies significant hot flushes.She gets tired.Reports good appetite.   Review of Systems: She is seeing orthopedics for the ankle problems and she has been told she has heel spurs .she denies back pain,cough,abdominal pain or signs of bleeding. Otherwise a detailed review of systems today was entirely stable   FAMILY HISTORY:  The patient's father died at the age of 16 from COPD/emphysema in the setting of tobacco abuse.  The patient's mother died at the age of 64 in the setting of dementia.  The patient has one brother with a history of neck cancer.  There is no breast or ovarian cancer in the family to her knowledge.  GYNECOLOGIC HISTORY:  She is Gx, P3, menarche age 30 and again status post TAH-BSO in 22 after which she took hormone replacement for approximately 24 years.  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  Daughter Santiago Glad lives in [place] and she works as a Software engineer.  Daughter [name] lives in [place] and works as a Music therapist Son Lanny Hurst lives in [place] and works for [employer].  The patient has three grandchildren.  She attends the [employer].  Medications:   Current Outpatient Prescriptions  Medication Sig Dispense Refill  . anastrozole (ARIMIDEX) 1 MG tablet Take 1 tablet (1 mg total) by mouth daily.  30 tablet  3  . aspirin 81 MG tablet Take 81 mg by mouth daily.      Marland Kitchen CALCIUM PO Take by mouth.        . Cholecalciferol (D-3-5 PO) Take by mouth.        Marland Kitchen lisinopril-hydrochlorothiazide  (PRINZIDE,ZESTORETIC) 20-12.5 MG per tablet One half tab every morning  50 tablet  3  . MAGNESIUM PO Take by mouth daily.        . Multiple Vitamin (MULTIVITAMIN) tablet Take 1 tablet by mouth daily.        . potassium chloride (K-DUR) 10 MEQ tablet Take 10 mEq by mouth daily.      Marland Kitchen pyridoxine (B-6) 100 MG tablet Take 100 mg by mouth daily.      . timolol (TIMOPTIC) 0.5 % ophthalmic solution BID times 48H.       No current facility-administered medications for this visit.    Allergies:  Allergies  Allergen Reactions  . Peanut-Containing Drug Products     All nuts cause fever blisters    Physical Exam: Middle-aged white woman in no acute distress Filed Vitals:   12/29/13 1321  BP: 98/61  Pulse: 60  Temp: 98.6 F (37 C)  Resp: 18  Body mass index is 33.08 kg/(m^2). Filed Weights   12/29/13 1321  Weight: 170 lb 11.2 oz (77.429 kg)   ECOG:  0  Sclerae unicteric Oropharynx clear No cervical or supraclavicular adenopathy Lungs no rales or rhonchi Heart regular rate and rhythm Abd soft, obese, nontender, positive bowel sounds MSK no focal spinal tenderness, no peripheral edema Neuro: nonfocal, well oriented, appropriate affect Breasts: The right breast is unremarkable; the left breast is status post lumpectomy and radiation: There is no evidence of local recurrence. Left axilla is benign     Lab Results: Lab Results  Component Value Date   WBC 4.8 12/29/2013   HGB 12.1 12/29/2013   HCT 36.7 12/29/2013   MCV 92.6 12/29/2013   PLT 174 12/29/2013   NEUTROABS 3.4 12/29/2013     Chemistry      Component Value Date/Time   NA 141 12/29/2013 1249   NA 141 11/08/2013 0815   K 4.8 12/29/2013 1249   K 4.9 11/08/2013 0815   CL 106 11/08/2013 0815   CL 104 01/13/2013 1101   CO2 29 12/29/2013 1249   CO2 29 11/08/2013 0815   BUN 16.2 12/29/2013 1249   BUN 17 11/08/2013 0815   CREATININE 0.8 12/29/2013 1249   CREATININE 0.8 11/08/2013 0815      Component Value Date/Time   CALCIUM 10.0  12/29/2013 1249   CALCIUM 9.7 11/08/2013 0815   ALKPHOS 61 12/29/2013 1249   ALKPHOS 47 11/08/2013 0815   AST 18 12/29/2013 1249   AST 20 11/08/2013 0815   ALT 19 12/29/2013 1249   ALT 21 11/08/2013 0815   BILITOT 0.40 12/29/2013 1249   BILITOT 0.6 11/08/2013 0815         CLINICAL DATA: 73 year old postmenopausal female with history of  breast cancer. Patient takes calcium, vitamin D, and tamoxifen. She  stopped taking hormones over 2 years ago.  EXAM:  DUAL X-RAY ABSORPTIOMETRY (DXA) FOR BONE MINERAL DENSITY  COMPARISON: There has been no significant change in BMD in the  spine and a statistically significant 7.3% decrease in BMD in the  total left hip as compared to 04/22/2011.  FINDINGS:  AP LUMBAR SPINE (L1-L4)  Bone Mineral Density (BMD): 0.827 g/cm2  Young Adult T Score: -2.0  Z Score: 0.0  LEFT FEMUR (NECK)  Bone Mineral Density (BMD): 0.695 g/cm2  Young Adult T Score: -1.4  Z Score: 0.7  ASSESSMENT: Patient's diagnostic category is LOW BONE MASS by WHO  Criteria.  FRACTURE RISK: MODERATE  FRAX:  Based on the [place] model, the 10 year  probability of a major osteoporotic fracture is 16%. The 10 year  probability of a hip fracture is 3.3%.  RECOMMENDATIONS:  Effective therapies are available in the form of bisphosphonates,  selective estrogen receptor modulators, biologic agents, and hormone  replacement therapy (for women). All patients should ensure an  adequate intake of dietary calcium (1269m daily) and vitamin D (800  IU daily) unless contraindicated.  All treatment decisions require clinical judgement and consideration  of individual patient factors, including patient preferences,  co-morbidities, previous drug use, risk factors not captured in the  FRAX model (e.g., frailty, falls, vitamin D deficiency, increased  bone turnover, interval significant decline in bone density) and  possible under-or over-estimation of fracture risk by FRAX.  The  National Osteoporosis Foundation recommends that FDA-approved  medical  therapies be considered in postmenopausal women and mean age 6328or  older with a:  Effective therapies are available in the form of bisphosphonates,  selective estrogen receptor modulators, biologic agents, and hormone  replacement therapy (for women). All patients should ensure an  adequate intake of dietary calcium (12108m daily) and vitamin D (800  IU daily) unless contraindicated.  All treatment decisions require clinical judgement and consideration  of individual patient factors, including patient preferences,  co-morbidities, previous drug use, risk factors not captured in the  FRAX model (e.g., frailty, falls, vitamin D deficiency, increased  bone turnover, interval significant decline in bone density) and  possible under-or over-estimation of fracture risk by FRAX.  The National Osteoporosis Foundation recommends that FDA-approved  medical  therapies be considered in postmenopausal women and mean age 5658or  older with a:  1. Hip or vertebral (clinical or morphometric) fracture.  2. T-score of -2.5 or lower at the spine or hip.  3. Ten-year fracture probability by FRAX of 3% or greater for hip  fracture or 20% or greater for major osteoporotic fracture.  FOLLOW UP: People with diagnosed cases of osteoporosis or at high  risk for fracture should have regular bone mineral density tests.  For patients eligible for Medicare, routine testing is allowed once  every 2 years. The testing frequency can be increased to one year  for patients who have rapidly progressing disease, those who are  receiving or discontinuing medical therapy to restore bone mass, or  have additional risk factors.  World HPharmacologist([facility] Criteria:  Normal: T scores from +1.0 to -1.0  Low Bone Mass (Osteopenia): T scores between -1.0 and -2.5  Osteoporosis: T scores -2.5 and below  Comparison to Reference Population:  T score is  the key measure used in the diagnosis of osteoporosis and  relative risk determination for fracture. It provides a value for  bone mass relative to the mean bone mass of a young adult reference  population expressed in terms of standard deviation (SD).  Z score is the age-matched score showing the patient's values  compared to a population matched for age, sex, and race. This is  also expressed in terms of standard deviation. The patient may have  values that compare favorably to the age-matched values and still be  at increased risk for fracture.  Electronically Signed  By: EUlyess BlossomM.D.  On: 08/31/2013 13:11            Radiological Studies:  Assessment:  73y.o.  [place] woman   (1) status post left lumpectomy and sentinel lymph node sampling January 2012 for a pT1c pN1, stage IIB  invasive ductal carcinoma, grade 2,  strongly estrogen and progesterone receptor positive, HER2 negative, MIB-1 of 44%,  (2) treated adjuvantly with docetaxel and cyclophosphamide x4 completed May 2012   (3) radiation completed August 2012.   (4) began on tamoxifen in August 2012 discontinued 09/2013  (5) started Arimidex 10/2013   Plan:  Patient will continue on Arimidex 1 mg daily Take Calcium 600 mg 2 daily and Vit d 2000 IU daily.  She is now on Arimidex and given her recent Bone Density report I recommended Zolendronic acid twice a year to prevent further bone loss while on aromatase inhibitor.Patient agreed and will schedule the infusion on follow up.  Record review indicated on prior PET [place]  a 12 mm exophytic left kidney hypodensity which was incompletely characterized due to abscence of contrast without increased FDG uptake most likely benign. Will schedule  MRI kidneys  to  reevaluate kidney lesion Also notes indicate on prior CT thyroid nodule but could not find the complete report.  She  Is due for mammogram  08/2014.  Will follow up to review MRI  results /Zometa infusion.   She knows to call for any problems that may develop before the next visit here.  Amada Kingfisher, Oncology/Hematology 12/30/2013

## 2013-12-30 NOTE — Telephone Encounter (Signed)
Called to inform patient she would be scheduled for Zometa every 6 months for Osteopenia. Informed her scheduling will be calling her to set up an appointment. Informed patient Zometa was an IV medication that took about 15 minutes but to expect to be about an hour. Patient stated she spoke with Dr.Faidas about Prolia, an injection that she would rather have if she could instead of IV. Informed patient we needed to get prior authorization from her insurance company, informed her that we would work on getting the authorization and call her back. Patient stated verbalized understanding, denies any questions or concerns at this time. Informed patient to call back with any other questions.

## 2014-01-02 ENCOUNTER — Telehealth: Payer: Self-pay | Admitting: Oncology

## 2014-01-02 ENCOUNTER — Other Ambulatory Visit: Payer: Self-pay

## 2014-01-02 ENCOUNTER — Other Ambulatory Visit: Payer: Self-pay | Admitting: *Deleted

## 2014-01-02 ENCOUNTER — Telehealth: Payer: Self-pay | Admitting: *Deleted

## 2014-01-02 DIAGNOSIS — C50512 Malignant neoplasm of lower-outer quadrant of left female breast: Secondary | ICD-10-CM

## 2014-01-02 NOTE — Telephone Encounter (Signed)
s/w pt re appt for 4/3 also confirmed 4/10 appts.

## 2014-01-02 NOTE — Telephone Encounter (Signed)
, °

## 2014-01-02 NOTE — Telephone Encounter (Signed)
Patient was called by scheduling to have Zometa scheduled and would like to wait until her appt on 4/17 that is already scheduled. Will send patient message to scheduling to have this changed from this Friday 01/06/14.

## 2014-01-03 ENCOUNTER — Telehealth: Payer: Self-pay | Admitting: *Deleted

## 2014-01-03 NOTE — Telephone Encounter (Signed)
Per staff message and POF I have scheduled appts.  JMW  

## 2014-01-06 ENCOUNTER — Ambulatory Visit: Payer: Medicare HMO

## 2014-01-18 ENCOUNTER — Ambulatory Visit (HOSPITAL_COMMUNITY)
Admission: RE | Admit: 2014-01-18 | Discharge: 2014-01-18 | Disposition: A | Discharge status: Home / Self Care | Payer: Medicare HMO | Source: Ambulatory Visit | Attending: Hematology and Oncology | Admitting: Hematology and Oncology

## 2014-01-18 DIAGNOSIS — Z853 Personal history of malignant neoplasm of breast: Secondary | ICD-10-CM | POA: Insufficient documentation

## 2014-01-18 DIAGNOSIS — C50512 Malignant neoplasm of lower-outer quadrant of left female breast: Secondary | ICD-10-CM

## 2014-01-18 DIAGNOSIS — N289 Disorder of kidney and ureter, unspecified: Secondary | ICD-10-CM

## 2014-01-18 DIAGNOSIS — K7689 Other specified diseases of liver: Secondary | ICD-10-CM | POA: Insufficient documentation

## 2014-01-18 DIAGNOSIS — R9389 Abnormal findings on diagnostic imaging of other specified body structures: Secondary | ICD-10-CM | POA: Insufficient documentation

## 2014-01-18 MED ORDER — GADOBENATE DIMEGLUMINE 529 MG/ML IV SOLN
16.0000 mL | Freq: Once | INTRAVENOUS | Status: AC | PRN
  Administered 2014-01-18: 16 mL via INTRAVENOUS

## 2014-01-20 ENCOUNTER — Ambulatory Visit: Payer: Medicare HMO

## 2014-01-20 ENCOUNTER — Ambulatory Visit (HOSPITAL_BASED_OUTPATIENT_CLINIC_OR_DEPARTMENT_OTHER): Payer: Medicare HMO | Admitting: Oncology

## 2014-01-20 ENCOUNTER — Telehealth: Payer: Self-pay | Admitting: *Deleted

## 2014-01-20 ENCOUNTER — Telehealth: Payer: Self-pay | Admitting: Oncology

## 2014-01-20 ENCOUNTER — Ambulatory Visit (HOSPITAL_BASED_OUTPATIENT_CLINIC_OR_DEPARTMENT_OTHER): Payer: Medicare HMO

## 2014-01-20 ENCOUNTER — Other Ambulatory Visit (HOSPITAL_BASED_OUTPATIENT_CLINIC_OR_DEPARTMENT_OTHER): Payer: Medicare HMO

## 2014-01-20 VITALS — BP 113/68 | HR 68 | Temp 99.5°F | Resp 18 | Ht 60.25 in | Wt 171.1 lb

## 2014-01-20 DIAGNOSIS — M949 Disorder of cartilage, unspecified: Secondary | ICD-10-CM

## 2014-01-20 DIAGNOSIS — C50512 Malignant neoplasm of lower-outer quadrant of left female breast: Secondary | ICD-10-CM

## 2014-01-20 DIAGNOSIS — M899 Disorder of bone, unspecified: Secondary | ICD-10-CM

## 2014-01-20 DIAGNOSIS — Z85828 Personal history of other malignant neoplasm of skin: Secondary | ICD-10-CM

## 2014-01-20 DIAGNOSIS — M810 Age-related osteoporosis without current pathological fracture: Secondary | ICD-10-CM

## 2014-01-20 DIAGNOSIS — C50519 Malignant neoplasm of lower-outer quadrant of unspecified female breast: Secondary | ICD-10-CM

## 2014-01-20 DIAGNOSIS — M8580 Other specified disorders of bone density and structure, unspecified site: Secondary | ICD-10-CM

## 2014-01-20 DIAGNOSIS — Z170 Estrogen receptor positive status [ER+]: Secondary | ICD-10-CM

## 2014-01-20 DIAGNOSIS — I10 Essential (primary) hypertension: Secondary | ICD-10-CM

## 2014-01-20 HISTORY — DX: Age-related osteoporosis without current pathological fracture: M81.0

## 2014-01-20 LAB — CBC WITH DIFFERENTIAL/PLATELET
BASO%: 0.8 % (ref 0.0–2.0)
Basophils Absolute: 0 10e3/uL (ref 0.0–0.1)
EOS%: 1.4 % (ref 0.0–7.0)
Eosinophils Absolute: 0.1 10e3/uL (ref 0.0–0.5)
HCT: 39.2 % (ref 34.8–46.6)
HGB: 12.8 g/dL (ref 11.6–15.9)
LYMPH%: 17.4 % (ref 14.0–49.7)
MCH: 30.3 pg (ref 25.1–34.0)
MCHC: 32.6 g/dL (ref 31.5–36.0)
MCV: 92.8 fL (ref 79.5–101.0)
MONO#: 0.4 10e3/uL (ref 0.1–0.9)
MONO%: 7.6 % (ref 0.0–14.0)
NEUT#: 4.2 10e3/uL (ref 1.5–6.5)
NEUT%: 72.8 % (ref 38.4–76.8)
Platelets: 177 10e3/uL (ref 145–400)
RBC: 4.22 10e6/uL (ref 3.70–5.45)
RDW: 13.5 % (ref 11.2–14.5)
WBC: 5.8 10e3/uL (ref 3.9–10.3)
lymph#: 1 10e3/uL (ref 0.9–3.3)

## 2014-01-20 LAB — BASIC METABOLIC PANEL (CC13)
Anion Gap: 10 mEq/L (ref 3–11)
BUN: 15.9 mg/dL (ref 7.0–26.0)
CHLORIDE: 104 meq/L (ref 98–109)
CO2: 26 mEq/L (ref 22–29)
Calcium: 9.8 mg/dL (ref 8.4–10.4)
Creatinine: 0.9 mg/dL (ref 0.6–1.1)
Glucose: 103 mg/dl (ref 70–140)
POTASSIUM: 4.4 meq/L (ref 3.5–5.1)
Sodium: 140 mEq/L (ref 136–145)

## 2014-01-20 MED ORDER — SODIUM CHLORIDE 0.9 % IV SOLN
INTRAVENOUS | Status: DC
  Administered 2014-01-20: 12:00:00 via INTRAVENOUS

## 2014-01-20 MED ORDER — ZOLEDRONIC ACID 4 MG/100ML IV SOLN
4.0000 mg | Freq: Once | INTRAVENOUS | Status: AC
  Administered 2014-01-20: 4 mg via INTRAVENOUS
  Filled 2014-01-20: qty 100

## 2014-01-20 NOTE — Telephone Encounter (Signed)
Per staff message and POF I have scheduled appts.  JMW  

## 2014-01-20 NOTE — Progress Notes (Signed)
Hematology and Oncology Follow Up Visit  Briana Smith [id number] [birth year] 73 y.o. 01/20/2014   PCP: Joycelyn Man, MD GYN: SU:  OTHER MD: Carmon Sails, [name], Ethelene Hal  HPI: Briana Smith had screening mammography August 14, 2010 at the [facility] showing a possible mass in the left breast.  She was brought back for additional views November 22 and Dr. Sadie Haber was able to confirm an ill defined mass in the inferior portion of the left breast which was not palpable.  Ultrasound showed this to be irregularly marginated and to measure approximately 1 cm.  The left axilla was unremarkable.  Biopsy was performed the same day and showed ([id number]) an intermediate grade invasive ductal carcinoma which was estrogen receptor positive at 100%, progesterone receptor positive at 99% with an elevated MIB-1 at 44%, but no HER2/neu amplification by CISH with a ratio of 1.62.  Bilateral breast MRIs were obtained on September 04, 2010.  This confirmed a 1.7 cm irregularly marginated mass in the left breast consistent with the previously diagnosed cancer.  They could also see the clip from her prior benign left breast biopsy obtained in 2000.   However, to complicate things there was an area in the left and upper manubrium with abnormal signal intensity.  It was felt that a further evaluation would be helpful and the patient had a bone scan on December 7.  This showed a strong focus of uptake in the central upper sternum and a second one at the sternal manubrial junction slightly to the left.  There was also a symmetric increased uptake in the right proximal humerus and a smaller area of uptake again rather faint in the left proximal femur.  This was felt to be most consistent with metastatic disease.  The patient then saw Dr. Humphrey Rolls in our office and she discussed her disease in terms of stage IV breast cancer.   Dr. Humphrey Rolls set the patient up for a PET scan which was performed on December 12 with  associated CT scan.  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  There was some fluid within the joints as well on MRI review.  Similarly, with the lesion in the sternomanubrial joints, there was an SUV maximum of 3.1 and significant sclerosis more consistent with arthropathy.  The increased uptake in the right proximal humerus could not be reproduced by CT or PET.  The breast primary had an SUV of 1.8 and measured 1.0 cm on these images.  There was no adenopathy in the axilla or chest and the rest of the CT/PET was negative except for an 8 mm right thyroid nodule which will require a work up at some point seen only the CT scan, not on the PET scan.  There was a nonhypermetabolic right hepatic lobe lesion felt to be most consistent with a complex cyst or hemangioma.  There was also hemangioma in the L3 vertebral body and in other, thoracic vertebrae.  Finally, there was an exophytic upper pole left renal lesion felt to be most likely a cyst.  The patient then sought a second opinion under Dr. Gypsy Balsam at [facility] and she was seen there October 14, 2010.  Dr. Philipp Ovens repeated the bone scan and PET scan and proceeded to obtain plain films of the left hip and proximal humerus. Those studies confirmed that the patient essentially she had some  arthritis but no evidence of metastatic disease. The patient then proceeded to left partial mastectomy with sentinel lymph node sampling October 23, 2010.  The results from that procedure ([id number]) showed a 1.2 cm grade 2 invasive ductal carcinoma with 1 of 2 sentinel lymph nodes involved.  Margins were negative.  There was evidence of lymphovascular invasion.  HER2/neu CISH was repeated and was again negative with the ratio of 1.58.  Briana Smith received adjuvant chemotherapy consisting of 4 cycles of docetaxel and cyclophosphamide. Chemotherapy was completed  in May of 2012 and was followed by radiation therapy. Radiation was completed in mid August 2012, at which time patient began on tamoxifen, 20 mg daily. Her subsequent history is as detailed below.  Interim History:   Derrika returns today for followup of her breast cancer. She went off tamoxifen in December of 14 and a 10/06/2013 she started anastrozole. She had been alerted to the possible toxicities, side effects and complications and expected major problems, but actually she has had practically none. Specifically she has had no problems with hot flashes and no change in her baseline vaginal dryness. She has not developed any arthralgias or myalgias that she is aware of.  Review of Systems: She does have some arthritis problems "here and there" but they're not more intense or persistent than before. She tells me she sees her dentist regularly and that her disease could have always been fine". Aside from this a detailed review of systems today was entirely negative.  FAMILY HISTORY:  The patient's father died at the age of 50 from COPD/emphysema in the setting of tobacco abuse.  The patient's mother died at the age of 68 in the setting of dementia.  The patient has one brother with a history of neck cancer.  There is no breast or ovarian cancer in the family to her knowledge.  GYNECOLOGIC HISTORY:  She is Gx, P3, menarche age 32 and again status post TAH-BSO in 52 after which she took hormone replacement for approximately 24 years.  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  The patient has three grandchildren.  She attends the [employer].  Medications:   Current Outpatient Prescriptions  Medication Sig Dispense  Refill  . anastrozole (ARIMIDEX) 1 MG tablet Take 1 tablet (1 mg total) by mouth daily.  30 tablet  3  . aspirin 81 MG tablet Take 81 mg by mouth daily.      Marland Kitchen CALCIUM PO Take by mouth.        . Cholecalciferol (D-3-5 PO) Take by mouth.        Marland Kitchen lisinopril-hydrochlorothiazide (PRINZIDE,ZESTORETIC) 20-12.5 MG per tablet One half tab every morning  50 tablet  3  . MAGNESIUM PO Take by mouth daily.        . Multiple Vitamin (MULTIVITAMIN) tablet Take 1 tablet by mouth daily.        . potassium chloride (K-DUR) 10 MEQ tablet Take 10 mEq by mouth daily.      Marland Kitchen pyridoxine (B-6) 100 MG tablet Take 100 mg by mouth daily.      . timolol (TIMOPTIC) 0.5 % ophthalmic solution BID times 48H.       No current facility-administered medications  for this visit.    Allergies:  Allergies  Allergen Reactions  . Peanut-Containing Drug Products     All nuts cause fever blisters    Physical Exam: Middle-aged white woman who appears his stated age 67 Vitals:   01/20/14 1101  BP: 113/68  Pulse: 68  Temp: 99.5 F (37.5 C)  Resp: 18  Body mass index is 33.15 kg/(m^2). Filed Weights   01/20/14 1101  Weight: 171 lb 1.6 oz (77.61 kg)   ECOG:  0  Sclerae unicteric, pupils equal and round Oropharynx clear and moist, teeth in good repair No cervical or supraclavicular adenopathy Lungs no rales or rhonchi Heart regular rate and rhythm Abd soft, obese, nontender, positive bowel sounds MSK no focal spinal tenderness, no peripheral edema Neuro: nonfocal, well oriented, positive affect Breasts: The right breast is unremarkable; the left breast is status post lumpectomy and radiation: There is no evidence of local recurrence. The left axilla is benign   Lab Results: Lab Results  Component Value Date   WBC 5.8 01/20/2014   HGB 12.8 01/20/2014   HCT 39.2 01/20/2014   MCV 92.8 01/20/2014   PLT 177 01/20/2014   NEUTROABS 4.2 01/20/2014     Chemistry      Component Value Date/Time   NA 141 12/29/2013  1249   NA 141 11/08/2013 0815   K 4.8 12/29/2013 1249   K 4.9 11/08/2013 0815   CL 106 11/08/2013 0815   CL 104 01/13/2013 1101   CO2 29 12/29/2013 1249   CO2 29 11/08/2013 0815   BUN 16.2 12/29/2013 1249   BUN 17 11/08/2013 0815   CREATININE 0.8 12/29/2013 1249   CREATININE 0.8 11/08/2013 0815      Component Value Date/Time   CALCIUM 10.0 12/29/2013 1249   CALCIUM 9.7 11/08/2013 0815   ALKPHOS 61 12/29/2013 1249   ALKPHOS 47 11/08/2013 0815   AST 18 12/29/2013 1249   AST 20 11/08/2013 0815   ALT 19 12/29/2013 1249   ALT 21 11/08/2013 0815   BILITOT 0.40 12/29/2013 1249   BILITOT 0.6 11/08/2013 0815        Radiological Studies: Mr Abdomen W Wo Contrast  01/18/2014   CLINICAL DATA:  Prior history of breast cancer. A kidney hypodensity noted on outside PET-CT. Evaluate for potential renal neoplasm.  EXAM: MRI ABDOMEN WITHOUT AND WITH CONTRAST  TECHNIQUE: Multiplanar multisequence MR imaging of the abdomen was performed both before and after the administration of intravenous contrast.  CONTRAST:  82m MULTIHANCE GADOBENATE DIMEGLUMINE 529 MG/ML IV SOLN  COMPARISON:  PET-CT dated 09/16/2010.  FINDINGS: In the upper pole of the left kidney there is an exophytic 1.6 cm lesion which is low signal intensity on T1 weighted images, high signal intensity on T2 weighted images, and does not enhance, compatible with a simple cyst. There is also a smaller 5 mm simple cyst in the interpolar region of the right kidney. No suspicious renal lesions are identified.  Several hepatic lesions are noted. One of these measures 4 mm in the posterior aspect of segment 7 and is low signal intensity on T1 weighted images, high signal intensity on T2 weighted images, and does not enhance, compatible with a small cyst. The other lesions include a 7 mm lesion in segment 7, a 1 cm lesion in segment 2, and a 2.0 x 1.6 cm lesion between segments 5 and 8, which all demonstrate low signal intensity on T1 weighted images, high signal intensity on T2  weighted images, with progressive  enhancement on post gadolinium images (initially peripheral nodular enhancement for the larger lesions), compatible with cavernous hemangiomas. No other suspicious hepatic lesions are noted. No intra or extrahepatic biliary ductal dilatation.  The appearance of the gallbladder, pancreas, spleen and bilateral adrenal glands is unremarkable.  IMPRESSION: 1. The only renal lesions identified are compatible with simple cysts, as above. 2. 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   Electronically Signed   By: Vinnie Langton M.D.   On: 01/18/2014 12:07    Assessment:  73 y.o.  [place] woman   (1) status post left lumpectomy and sentinel lymph node sampling January 2012 for a pT1c pN1, stage IIB  invasive ductal carcinoma, grade 2,  strongly estrogen and progesterone receptor positive, HER2 negative, MIB-1 of 44%,  (2) treated adjuvantly with docetaxel and cyclophosphamide x4 completed May 2012   (3) radiation completed August 2012.   (4) began on tamoxifen in August 2012, switched to anastrozole 10/06/2013  (5) DEXA scan November 20 15th shows osteopenia with a T score of -2.0; zolendronate started April 2015, to be repeated every 6 months while on anastrozole.  Plan:  Faithanne is tolerating the anastrozole with no significant side effects. The plan is going to be to continue that to August of 2017. We discussed vaginal dryness issues and she declined referral to our pelvic health program. We spent the better part of today's 40 minute visit discussing her osteopenia, which is close to osteoporosis.  We discussed oral versus intravenous bisphosphonates, and she has a good understanding of the possible toxicities, side effects and complications of these agents including the risk of paresthesias and other symptoms following the intravenous administration as well as the rare development of  osteonecrosis of the jaw.  Your much discussion she decided she would like to go with the zolendronate every 6 months and we will continue that while she is on anastrozole. She will receive her treatment today and again in 6 months from now when she returns to see [place]. I advised her to replenish your calcium for the next 3 days to avoid symptomatic hypocalcemia.  Otherwise she will call with any problems that may develop before her next visit here.  Chauncey Cruel, MD  01/20/2014

## 2014-01-20 NOTE — Telephone Encounter (Signed)
, °

## 2014-01-20 NOTE — Patient Instructions (Signed)
Zoledronic Acid injection (Hypercalcemia, Oncology) (ZOMETA) What is this medicine? ZOLEDRONIC ACID (ZOE le dron ik AS id) lowers the amount of calcium loss from bone. It is used to treat too much calcium in your blood from cancer. It is also used to prevent complications of cancer that has spread to the bone. This medicine may be used for other purposes; ask your health care provider or pharmacist if you have questions. COMMON BRAND NAME(S): Zometa What should I tell my health care provider before I take this medicine? They need to know if you have any of these conditions: -aspirin-sensitive asthma -cancer, especially if you are receiving medicines used to treat cancer -dental disease or wear dentures -infection -kidney disease -receiving corticosteroids like dexamethasone or prednisone -an unusual or allergic reaction to zoledronic acid, other medicines, foods, dyes, or preservatives -pregnant or trying to get pregnant -breast-feeding How should I use this medicine? This medicine is for infusion into a vein. It is given by a health care professional in a hospital or clinic setting. Talk to your pediatrician regarding the use of this medicine in children. Special care may be needed. Overdosage: If you think you have taken too much of this medicine contact a poison control center or emergency room at once. NOTE: This medicine is only for you. Do not share this medicine with others. What if I miss a dose? It is important not to miss your dose. Call your doctor or health care professional if you are unable to keep an appointment. What may interact with this medicine? -certain antibiotics given by injection -NSAIDs, medicines for pain and inflammation, like ibuprofen or naproxen -some diuretics like bumetanide, furosemide -teriparatide -thalidomide This list may not describe all possible interactions. Give your health care provider a list of all the medicines, herbs, non-prescription drugs,  or dietary supplements you use. Also tell them if you smoke, drink alcohol, or use illegal drugs. Some items may interact with your medicine. What should I watch for while using this medicine? Visit your doctor or health care professional for regular checkups. It may be some time before you see the benefit from this medicine. Do not stop taking your medicine unless your doctor tells you to. Your doctor may order blood tests or other tests to see how you are doing. Women should inform their doctor if they wish to become pregnant or think they might be pregnant. There is a potential for serious side effects to an unborn child. Talk to your health care professional or pharmacist for more information. You should make sure that you get enough calcium and vitamin D while you are taking this medicine. Discuss the foods you eat and the vitamins you take with your health care professional. Some people who take this medicine have severe bone, joint, and/or muscle pain. This medicine may also increase your risk for jaw problems or a broken thigh bone. Tell your doctor right away if you have severe pain in your jaw, bones, joints, or muscles. Tell your doctor if you have any pain that does not go away or that gets worse. Tell your dentist and dental surgeon that you are taking this medicine. You should not have major dental surgery while on this medicine. See your dentist to have a dental exam and fix any dental problems before starting this medicine. Take good care of your teeth while on this medicine. Make sure you see your dentist for regular follow-up appointments. What side effects may I notice from receiving this medicine? Side effects   that you should report to your doctor or health care professional as soon as possible: -allergic reactions like skin rash, itching or hives, swelling of the face, lips, or tongue -anxiety, confusion, or depression -breathing problems -changes in vision -eye pain -feeling faint  or lightheaded, falls -jaw pain, especially after dental work -mouth sores -muscle cramps, stiffness, or weakness -trouble passing urine or change in the amount of urine Side effects that usually do not require medical attention (report to your doctor or health care professional if they continue or are bothersome): -bone, joint, or muscle pain -constipation -diarrhea -fever -hair loss -irritation at site where injected -loss of appetite -nausea, vomiting -stomach upset -trouble sleeping -trouble swallowing -weak or tired This list may not describe all possible side effects. Call your doctor for medical advice about side effects. You may report side effects to FDA at 1-800-FDA-1088. Where should I keep my medicine? This drug is given in a hospital or clinic and will not be stored at home. NOTE: This sheet is a summary. It may not cover all possible information. If you have questions about this medicine, talk to your doctor, pharmacist, or health care provider.  2014, Elsevier/Gold Standard. (2013-03-03 13:03:13)  

## 2014-01-20 NOTE — Progress Notes (Signed)
Zometa infusion completed without difficulty. Consent obtained. AVS printed and explained to patient.

## 2014-02-03 ENCOUNTER — Other Ambulatory Visit: Payer: Self-pay | Admitting: Oncology

## 2014-02-03 DIAGNOSIS — C50519 Malignant neoplasm of lower-outer quadrant of unspecified female breast: Secondary | ICD-10-CM

## 2014-02-14 ENCOUNTER — Encounter: Payer: Self-pay | Admitting: Oncology

## 2014-02-14 NOTE — Progress Notes (Signed)
Patient has [employer] Medicare no asst with Zometa.

## 2014-03-13 ENCOUNTER — Ambulatory Visit (INDEPENDENT_AMBULATORY_CARE_PROVIDER_SITE_OTHER): Payer: Medicare HMO | Admitting: Family Medicine

## 2014-03-13 ENCOUNTER — Encounter: Payer: Self-pay | Admitting: Family Medicine

## 2014-03-13 VITALS — BP 126/80 | HR 74 | Temp 98.4°F | Wt 174.0 lb

## 2014-03-13 DIAGNOSIS — J209 Acute bronchitis, unspecified: Secondary | ICD-10-CM

## 2014-03-13 MED ORDER — HYDROCODONE-HOMATROPINE 5-1.5 MG/5ML PO SYRP: 5.0000 mL | ORAL_SOLUTION | Freq: Four times a day (QID) | ORAL | Status: AC | PRN

## 2014-03-13 NOTE — Progress Notes (Signed)
Pre visit review using our clinic review tool, if applicable. No additional management support is needed unless otherwise documented below in the visit note. 

## 2014-03-13 NOTE — Progress Notes (Signed)
   Subjective:    Patient ID: Briana Smith, female    DOB: [birth year], 73 y.o.   MRN: [id number]  Cough Associated symptoms include headaches. Pertinent negatives include no chills, sore throat, shortness of breath or wheezing.   Acute visit. Onset of cough last week. She was visiting family in [place]. Her cough is been nonproductive. She's had some mild nasal congestion. Denies any fever currently. Low-grade fever last night but none today. Overall she feels better today. 6 Mucinex. Nonsmoker. No history of asthma  Past Medical History  Diagnosis Date  . Hypertension   . Heart murmur   . Hyperlipidemia   . Cancer     breast cancer- left   Past Surgical History  Procedure Laterality Date  . Abdominal hysterectomy  1987  . Breast lumpectomy  10/23/10    left  . Port-a-cath removal  09/02/2011    Procedure: REMOVAL PORT-A-CATH;  Surgeon: Adin Hector, MD;  Location: [facility];  Service: General;  Laterality: Right;    reports that she has never smoked. She has never used smokeless tobacco. She reports that she does not drink alcohol or use illicit drugs. family history includes COPD in her father; Cancer in her brother. Allergies  Allergen Reactions  . Peanut-Containing Drug Products     All nuts cause fever blisters      Review of Systems  Constitutional: Negative for chills.  HENT: Positive for congestion. Negative for sore throat.   Respiratory: Positive for cough. Negative for shortness of breath and wheezing.   Neurological: Positive for headaches.       Objective:   Physical Exam  Constitutional: She appears well-developed and well-nourished.  HENT:  Right Ear: External ear normal.  Left Ear: External ear normal.  Mouth/Throat: Oropharynx is clear and moist.  Neck: Neck supple.  Cardiovascular: Normal rate and regular rhythm.   Pulmonary/Chest: Effort normal and breath sounds normal. No respiratory distress. She has no wheezes. She  has no rales.  Lymphadenopathy:    She has no cervical adenopathy.          Assessment & Plan:  Acute bronchitis. Suspect viral. Nonfocal exam. Hycodan cough syrup for nighttime use as needed. Followup when necessary

## 2014-03-13 NOTE — Patient Instructions (Signed)
Acute Bronchitis Bronchitis is inflammation of the airways that extend from the windpipe into the lungs (bronchi). The inflammation often causes mucus to develop. This leads to a cough, which is the most common symptom of bronchitis.  In acute bronchitis, the condition usually develops suddenly and goes away over time, usually in a couple weeks. Smoking, allergies, and asthma can make bronchitis worse. Repeated episodes of bronchitis may cause further lung problems.  CAUSES Acute bronchitis is most often caused by the same virus that causes a cold. The virus can spread from person to person (contagious).  SIGNS AND SYMPTOMS   Cough.   Fever.   Coughing up mucus.   Body aches.   Chest congestion.   Chills.   Shortness of breath.   Sore throat.  DIAGNOSIS  Acute bronchitis is usually diagnosed through a physical exam. Tests, such as chest X-rays, are sometimes done to rule out other conditions.  TREATMENT  Acute bronchitis usually goes away in a couple weeks. Often times, no medical treatment is necessary. Medicines are sometimes given for relief of fever or cough. Antibiotics are usually not needed but may be prescribed in certain situations. In some cases, an inhaler may be recommended to help reduce shortness of breath and control the cough. A cool mist vaporizer may also be used to help thin bronchial secretions and make it easier to clear the chest.  HOME CARE INSTRUCTIONS  Get plenty of rest.   Drink enough fluids to keep your urine clear or pale yellow (unless you have a medical condition that requires fluid restriction). Increasing fluids may help thin your secretions and will prevent dehydration.   Only take over-the-counter or prescription medicines as directed by your health care provider.   Avoid smoking and secondhand smoke. Exposure to cigarette smoke or irritating chemicals will make bronchitis worse. If you are a smoker, consider using nicotine gum or skin  patches to help control withdrawal symptoms. Quitting smoking will help your lungs heal faster.   Reduce the chances of another bout of acute bronchitis by washing your hands frequently, avoiding people with cold symptoms, and trying not to touch your hands to your mouth, nose, or eyes.   Follow up with your health care provider as directed.  SEEK MEDICAL CARE IF: Your symptoms do not improve after 1 week of treatment.  SEEK IMMEDIATE MEDICAL CARE IF:  You develop an increased fever or chills.   You have chest pain.   You have severe shortness of breath.  You have bloody sputum.   You develop dehydration.  You develop fainting.  You develop repeated vomiting.  You develop a severe headache. MAKE SURE YOU:   Understand these instructions.  Will watch your condition.  Will get help right away if you are not doing well or get worse. Document Released: 10/30/2004 Document Revised: 05/25/2013 Document Reviewed: 03/15/2013 ExitCare Patient Information 2014 [employer].  

## 2014-07-19 ENCOUNTER — Other Ambulatory Visit: Payer: Self-pay | Admitting: Emergency Medicine

## 2014-07-19 DIAGNOSIS — C50512 Malignant neoplasm of lower-outer quadrant of left female breast: Secondary | ICD-10-CM

## 2014-07-20 ENCOUNTER — Other Ambulatory Visit (HOSPITAL_BASED_OUTPATIENT_CLINIC_OR_DEPARTMENT_OTHER): Payer: Medicare HMO

## 2014-07-20 ENCOUNTER — Telehealth: Payer: Self-pay | Admitting: Oncology

## 2014-07-20 ENCOUNTER — Ambulatory Visit (HOSPITAL_BASED_OUTPATIENT_CLINIC_OR_DEPARTMENT_OTHER): Payer: Medicare HMO

## 2014-07-20 ENCOUNTER — Ambulatory Visit (HOSPITAL_BASED_OUTPATIENT_CLINIC_OR_DEPARTMENT_OTHER): Payer: Medicare HMO | Admitting: Oncology

## 2014-07-20 VITALS — BP 132/63

## 2014-07-20 VITALS — BP 160/89 | HR 71 | Temp 98.0°F | Resp 20 | Ht 60.25 in | Wt 177.2 lb

## 2014-07-20 DIAGNOSIS — M8580 Other specified disorders of bone density and structure, unspecified site: Secondary | ICD-10-CM

## 2014-07-20 DIAGNOSIS — Z85828 Personal history of other malignant neoplasm of skin: Secondary | ICD-10-CM

## 2014-07-20 DIAGNOSIS — C50512 Malignant neoplasm of lower-outer quadrant of left female breast: Secondary | ICD-10-CM

## 2014-07-20 DIAGNOSIS — Z170 Estrogen receptor positive status [ER+]: Secondary | ICD-10-CM

## 2014-07-20 LAB — COMPREHENSIVE METABOLIC PANEL (CC13)
ALT: 19 U/L (ref 0–55)
ANION GAP: 8 meq/L (ref 3–11)
AST: 15 U/L (ref 5–34)
Albumin: 3.5 g/dL (ref 3.5–5.0)
Alkaline Phosphatase: 50 U/L (ref 40–150)
BILIRUBIN TOTAL: 0.37 mg/dL (ref 0.20–1.20)
BUN: 17.4 mg/dL (ref 7.0–26.0)
CO2: 27 meq/L (ref 22–29)
CREATININE: 0.9 mg/dL (ref 0.6–1.1)
Calcium: 9.6 mg/dL (ref 8.4–10.4)
Chloride: 107 mEq/L (ref 98–109)
GLUCOSE: 65 mg/dL — AB (ref 70–140)
Potassium: 4.4 mEq/L (ref 3.5–5.1)
Sodium: 141 mEq/L (ref 136–145)
Total Protein: 6.9 g/dL (ref 6.4–8.3)

## 2014-07-20 LAB — CBC WITH DIFFERENTIAL/PLATELET
BASO%: 1.1 % (ref 0.0–2.0)
Basophils Absolute: 0 10*3/uL (ref 0.0–0.1)
EOS ABS: 0.1 10*3/uL (ref 0.0–0.5)
EOS%: 2.1 % (ref 0.0–7.0)
HEMATOCRIT: 37.3 % (ref 34.8–46.6)
HGB: 12.1 g/dL (ref 11.6–15.9)
LYMPH%: 18.1 % (ref 14.0–49.7)
MCH: 30.2 pg (ref 25.1–34.0)
MCHC: 32.4 g/dL (ref 31.5–36.0)
MCV: 93 fL (ref 79.5–101.0)
MONO#: 0.4 10*3/uL (ref 0.1–0.9)
MONO%: 8.8 % (ref 0.0–14.0)
NEUT%: 69.9 % (ref 38.4–76.8)
NEUTROS ABS: 3 10*3/uL (ref 1.5–6.5)
PLATELETS: 187 10*3/uL (ref 145–400)
RBC: 4.01 10*6/uL (ref 3.70–5.45)
RDW: 13.9 % (ref 11.2–14.5)
WBC: 4.3 10*3/uL (ref 3.9–10.3)
lymph#: 0.8 10*3/uL — ABNORMAL LOW (ref 0.9–3.3)

## 2014-07-20 MED ORDER — ZOLEDRONIC ACID 4 MG/100ML IV SOLN
4.0000 mg | Freq: Once | INTRAVENOUS | Status: AC
  Administered 2014-07-20: 4 mg via INTRAVENOUS
  Filled 2014-07-20: qty 100

## 2014-07-20 MED ORDER — SODIUM CHLORIDE 0.9 % IV SOLN
Freq: Once | INTRAVENOUS | Status: AC
  Administered 2014-07-20: 11:00:00 via INTRAVENOUS

## 2014-07-20 NOTE — Progress Notes (Signed)
Hematology and Oncology Follow Up Visit  Briana Smith [id number] 1941/07/20 73 y.o. 07/20/2014   PCP: Briana Man, MD GYN: SU:  OTHER MD: Briana Smith, Briana Smith, Briana Smith  CHIEF COMPLAINT: Estrogen receptor positive breast cancer  CURRENT TREATMENT: Anastrozole  HPI: From the original intake note:  Briana Smith had screening mammography August 14, 2010 at the [facility] showing a possible mass in the left breast.  She was brought back for additional views November 22 and Briana Smith was able to confirm an ill defined mass in the inferior portion of the left breast which was not palpable.  Ultrasound showed this to be irregularly marginated and to measure approximately 1 cm.  The left axilla was unremarkable.  Biopsy was performed the same day and showed ([id number]) an intermediate grade invasive ductal carcinoma which was estrogen receptor positive at 100%, progesterone receptor positive at 99% with an elevated MIB-1 at 44%, but no HER2/neu amplification by CISH with a ratio of 1.62.  Bilateral breast MRIs were obtained on September 04, 2010.  This confirmed a 1.7 cm irregularly marginated mass in the left breast consistent with the previously diagnosed cancer.  They could also see the clip from her prior benign left breast biopsy obtained in 2000.   However, to complicate things there was an area in the left and upper manubrium with abnormal signal intensity.  It was felt that a further evaluation would be helpful and the patient had a bone scan on December 7.  This showed a strong focus of uptake in the central upper sternum and a second one at the sternal manubrial junction slightly to the left.  There was also a symmetric increased uptake in the right proximal humerus and a smaller area of uptake again rather faint in the left proximal femur.  This was felt to be most consistent with metastatic disease.  The patient then saw Briana Smith in our office and she discussed her  disease in terms of stage IV breast cancer.   Briana Smith set the patient up for a PET scan which was performed on December 12 with associated CT scan.  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  There was some fluid within the joints as well on MRI review.  Similarly, with the lesion in the sternomanubrial joints, there was an SUV maximum of 3.1 and significant sclerosis more consistent with arthropathy.  The increased uptake in the right proximal humerus could not be reproduced by CT or PET.  The breast primary had an SUV of 1.8 and measured 1.0 cm on these images.  There was no adenopathy in the axilla or chest and the rest of the CT/PET was negative except for an 8 mm right thyroid nodule which will require a work up at some point seen only the CT scan, not on the PET scan.  There was a nonhypermetabolic right hepatic lobe lesion felt to be most consistent with a complex cyst or hemangioma.  There was also hemangioma in the L3 vertebral body and in other, thoracic vertebrae.  Finally, there was an exophytic upper pole left renal lesion felt to be most likely a cyst.  The patient then sought a second opinion under Briana Smith at [facility] and she was seen there October 14, 2010.  Briana Smith repeated the bone scan and PET scan and proceeded to obtain plain  films of the left hip and proximal humerus. Those studies confirmed that the patient essentially she had some arthritis but no evidence of metastatic disease. The patient then proceeded to left partial mastectomy with sentinel lymph node sampling October 23, 2010.  The results from that procedure ([id number]) showed a 1.2 cm grade 2 invasive ductal carcinoma with 1 of 2 sentinel lymph nodes involved.  Margins were negative.  There was evidence of lymphovascular invasion.  HER2/neu CISH was repeated and was again negative with the ratio of  1.58.  Briana Smith received adjuvant chemotherapy consisting of 4 cycles of docetaxel and cyclophosphamide. Chemotherapy was completed in May of 2012 and was followed by radiation therapy. Radiation was completed in mid August 2012, at which time patient began on tamoxifen, 20 mg daily.   Her subsequent history is as detailed below.  Interim History:   Briana Smith returns today for followup of her breast cancer. Generally she is doing well. She continues on letrozole. She does not have hot flashes and vaginal dryness R. rote not a problem". She has noted a little bit more pain over her hands particularly when she carries heavy stuff or does a lot of yard work. She wonders if the letrozole is contributing to that.  Review of Systems: She took a trip to [place] to visit one of her children and enjoyed that a great deal. She took a bus trip to [place] and visited the "home [place] the written famous" and enjoyed that as well. She has mild insomnia. She gets a little short of breath when climbing stairs. She is exercising on her treadmill 3-5 times a week although she finds it extremely boring. A detailed review of systems today was otherwise stable   FAMILY HISTORY:  The patient's father died at the age of 16 from COPD/emphysema in the setting of tobacco abuse.  The patient's mother died at the age of 61 in the setting of dementia.  The patient has one brother with a history of neck cancer.  There is no breast or ovarian cancer in the family to her knowledge.  GYNECOLOGIC HISTORY:  She is Gx, P3, menarche age 62 and again status post TAH-BSO in 59 after which she took hormone replacement for approximately 24 years.  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  Daughter Briana Smith lives in [place] and she works as a Software engineer.  Daughter Briana Smith lives in [place] and works as a Music therapist Son Briana Smith lives in [place] and works  for [employer].  The patient has three grandchildren.  She attends the [employer].  Medications:   Current Outpatient Prescriptions  Medication Sig Dispense Refill  . anastrozole (ARIMIDEX) 1 MG tablet TAKE ONE TABLET BY MOUTH ONCE DAILY.  30 tablet  5  . aspirin 81 MG tablet Take 81 mg by mouth daily.      Marland Kitchen CALCIUM PO Take by mouth.        . Cholecalciferol (D-3-5 PO) Take by mouth.        Marland Kitchen lisinopril-hydrochlorothiazide (PRINZIDE,ZESTORETIC) 20-12.5 MG per tablet One half tab every morning  50 tablet  3  . MAGNESIUM PO Take by mouth daily.        . Multiple Vitamin (MULTIVITAMIN) tablet Take 1 tablet by mouth daily.        . potassium chloride (K-DUR) 10 MEQ tablet Take 10 mEq by mouth daily.      Marland Kitchen  pyridoxine (B-6) 100 MG tablet Take 100 mg by mouth daily.      . timolol (TIMOPTIC) 0.5 % ophthalmic solution BID times 48H.       No current facility-administered medications for this visit.    Allergies:  Allergies  Allergen Reactions  . Peanut-Containing Drug Products     All nuts cause fever blisters    Physical Exam: Middle-aged white woman in no acute distress  Filed Vitals:   07/20/14 1019  BP: 160/89  Pulse: 71  Temp: 98 F (36.7 C)  Resp: 20  Body mass index is 34.34 kg/(m^2). Filed Weights   07/20/14 1019  Weight: 177 lb 3.2 oz (80.377 kg)   ECOG:  1  Sclerae unicteric,  EOMs intact  Oropharynx clear,  dentition in good repair  No cervical or supraclavicular adenopathy Lungs no rales or rhonchi Heart regular rate and rhythm Abd soft, obese, nontender, positive bowel sounds MSK no focal spinal tenderness, no  upper extremity lymphedema; significant arthritic changes over the hands  Neuro: nonfocal, well oriented, positive affect Breasts: The right breast is unremarkable. The left breast is status post radiation and lumpectomy. There is no evidence of local recurrence. The left axilla is benign.  Lab Results: Lab Results  Component Value  Date   WBC 5.8 01/20/2014   HGB 12.8 01/20/2014   HCT 39.2 01/20/2014   MCV 92.8 01/20/2014   PLT 177 01/20/2014   NEUTROABS 4.2 01/20/2014     Chemistry      Component Value Date/Time   NA 140 01/20/2014 1041   NA 141 11/08/2013 0815   K 4.4 01/20/2014 1041   K 4.9 11/08/2013 0815   CL 106 11/08/2013 0815   CL 104 01/13/2013 1101   CO2 26 01/20/2014 1041   CO2 29 11/08/2013 0815   BUN 15.9 01/20/2014 1041   BUN 17 11/08/2013 0815   CREATININE 0.9 01/20/2014 1041   CREATININE 0.8 11/08/2013 0815      Component Value Date/Time   CALCIUM 9.8 01/20/2014 1041   CALCIUM 9.7 11/08/2013 0815   ALKPHOS 61 12/29/2013 1249   ALKPHOS 47 11/08/2013 0815   AST 18 12/29/2013 1249   AST 20 11/08/2013 0815   ALT 19 12/29/2013 1249   ALT 21 11/08/2013 0815   BILITOT 0.40 12/29/2013 1249   BILITOT 0.6 11/08/2013 0815        Radiological Studies: No results found.   Assessment:  73 y.o.  [place] woman   (1) status post left lumpectomy and sentinel lymph node sampling January 2012 for a pT1c pN1, stage IIB  invasive ductal carcinoma, grade 2,  strongly estrogen and progesterone receptor positive, HER2 negative, MIB-1 of 44%,  (2) treated adjuvantly with docetaxel and cyclophosphamide x4 completed May 2012   (3) radiation completed August 2012.   (4) began on tamoxifen in August 2012, switched to anastrozole 10/06/2013  (5) DEXA scan 08/31/2013 shows osteopenia with a T score of -2.0; zolendronate started April 2015, to be repeated every 6 months while on anastrozole.  Plan:  Jhoselin is doing well from that point of view of breast cancer and specifically she is tolerating the anastrozole well. I don't think the problems she is having with her fingers are going to be related to anastrozole although I suppose that could be making the problem worse. 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 There is no evidence of an inflammatory arthritis however.  She is tolerating the zolendronate with no side  effects that she  is aware of. When she has her next DEXA scan, which will be about a year from now, I think she will be pleased with the results.  I encouraged her to continue to exercise on a regular basis. She will also continue on her calcium and vitamin D supplementation.  The plan is to continue antiestrogen therapy for a total of 5 years. I will take [place] to the fall of 2017.  Jenin is a good understanding of the overall plan. She agrees with that. She knows a goal of treatment in her case is cure. She will call with any problems that may develop before next visit here which will be in 6 months. After that we will likely start seeing her on a once a year basis.  Chauncey Cruel, MD  07/20/2014

## 2014-07-20 NOTE — Patient Instructions (Signed)

## 2014-07-21 ENCOUNTER — Telehealth: Payer: Self-pay | Admitting: Oncology

## 2014-07-21 NOTE — Telephone Encounter (Signed)
s.w. pt and advised on all appts....ok and aware °

## 2014-07-24 ENCOUNTER — Other Ambulatory Visit: Payer: Self-pay | Admitting: Oncology

## 2014-07-24 DIAGNOSIS — Z853 Personal history of malignant neoplasm of breast: Secondary | ICD-10-CM

## 2014-07-31 ENCOUNTER — Ambulatory Visit: Payer: Medicare HMO

## 2014-08-04 ENCOUNTER — Other Ambulatory Visit: Payer: Self-pay | Admitting: Oncology

## 2014-08-04 DIAGNOSIS — C50512 Malignant neoplasm of lower-outer quadrant of left female breast: Secondary | ICD-10-CM

## 2014-09-20 ENCOUNTER — Encounter (INDEPENDENT_AMBULATORY_CARE_PROVIDER_SITE_OTHER): Payer: Self-pay

## 2014-09-20 ENCOUNTER — Ambulatory Visit
Admission: RE | Admit: 2014-09-20 | Discharge: 2014-09-20 | Disposition: A | Discharge status: Home / Self Care | Payer: Medicare HMO | Source: Ambulatory Visit | Attending: Oncology | Admitting: Oncology

## 2014-09-20 DIAGNOSIS — Z853 Personal history of malignant neoplasm of breast: Secondary | ICD-10-CM

## 2014-11-09 ENCOUNTER — Other Ambulatory Visit (INDEPENDENT_AMBULATORY_CARE_PROVIDER_SITE_OTHER): Payer: Medicare HMO

## 2014-11-09 DIAGNOSIS — I10 Essential (primary) hypertension: Secondary | ICD-10-CM

## 2014-11-09 DIAGNOSIS — E785 Hyperlipidemia, unspecified: Secondary | ICD-10-CM

## 2014-11-09 DIAGNOSIS — Z0000 Encounter for general adult medical examination without abnormal findings: Secondary | ICD-10-CM

## 2014-11-09 LAB — TSH: TSH: 4.99 u[IU]/mL — ABNORMAL HIGH (ref 0.35–4.50)

## 2014-11-09 LAB — CBC WITH DIFFERENTIAL/PLATELET
BASOS PCT: 1.4 % (ref 0.0–3.0)
Basophils Absolute: 0.1 10*3/uL (ref 0.0–0.1)
Eosinophils Absolute: 0.1 10*3/uL (ref 0.0–0.7)
Eosinophils Relative: 3.1 % (ref 0.0–5.0)
HEMATOCRIT: 37.1 % (ref 36.0–46.0)
HEMOGLOBIN: 12.5 g/dL (ref 12.0–15.0)
LYMPHS PCT: 18 % (ref 12.0–46.0)
Lymphs Abs: 0.8 10*3/uL (ref 0.7–4.0)
MCHC: 33.8 g/dL (ref 30.0–36.0)
MCV: 90.6 fl (ref 78.0–100.0)
MONO ABS: 0.4 10*3/uL (ref 0.1–1.0)
Monocytes Relative: 8.1 % (ref 3.0–12.0)
NEUTROS PCT: 69.4 % (ref 43.0–77.0)
Neutro Abs: 3.3 10*3/uL (ref 1.4–7.7)
Platelets: 181 10*3/uL (ref 150.0–400.0)
RBC: 4.09 Mil/uL (ref 3.87–5.11)
RDW: 13.9 % (ref 11.5–15.5)
WBC: 4.7 10*3/uL (ref 4.0–10.5)

## 2014-11-09 LAB — POCT URINALYSIS DIP (MANUAL ENTRY)
BILIRUBIN UA: NEGATIVE
Bilirubin, UA: NEGATIVE
GLUCOSE UA: NEGATIVE
Nitrite, UA: NEGATIVE
PH UA: 5.5
Protein Ur, POC: NEGATIVE
Spec Grav, UA: 1.015
Urobilinogen, UA: 0.2

## 2014-11-09 LAB — BASIC METABOLIC PANEL
BUN: 21 mg/dL (ref 6–23)
CO2: 28 meq/L (ref 19–32)
CREATININE: 0.93 mg/dL (ref 0.40–1.20)
Calcium: 9.5 mg/dL (ref 8.4–10.5)
Chloride: 105 mEq/L (ref 96–112)
GFR: 62.3 mL/min (ref >60.00)
GLUCOSE: 93 mg/dL (ref 70–99)
POTASSIUM: 4.6 meq/L (ref 3.5–5.1)
Sodium: 140 mEq/L (ref 135–145)

## 2014-11-09 LAB — HEPATIC FUNCTION PANEL
ALBUMIN: 4.1 g/dL (ref 3.5–5.2)
ALT: 17 U/L (ref 0–35)
AST: 15 U/L (ref 0–37)
Alkaline Phosphatase: 43 U/L (ref 39–117)
Bilirubin, Direct: 0.1 mg/dL (ref 0.0–0.3)
TOTAL PROTEIN: 7.1 g/dL (ref 6.0–8.3)
Total Bilirubin: 0.4 mg/dL (ref 0.2–1.2)

## 2014-11-09 LAB — LIPID PANEL
Cholesterol: 245 mg/dL — ABNORMAL HIGH (ref 0–200)
HDL: 69.1 mg/dL (ref >39.00)
LDL Cholesterol: 158 mg/dL — ABNORMAL HIGH (ref 0–99)
NONHDL: 175.9
TRIGLYCERIDES: 92 mg/dL (ref 0.0–149.0)
Total CHOL/HDL Ratio: 4
VLDL: 18.4 mg/dL (ref 0.0–40.0)

## 2014-11-16 ENCOUNTER — Ambulatory Visit (INDEPENDENT_AMBULATORY_CARE_PROVIDER_SITE_OTHER): Payer: Medicare HMO | Admitting: Family Medicine

## 2014-11-16 ENCOUNTER — Encounter: Payer: Self-pay | Admitting: Family Medicine

## 2014-11-16 VITALS — BP 130/80 | Temp 98.0°F | Ht 60.25 in | Wt 176.0 lb

## 2014-11-16 DIAGNOSIS — C50512 Malignant neoplasm of lower-outer quadrant of left female breast: Secondary | ICD-10-CM

## 2014-11-16 DIAGNOSIS — I10 Essential (primary) hypertension: Secondary | ICD-10-CM

## 2014-11-16 DIAGNOSIS — Z85828 Personal history of other malignant neoplasm of skin: Secondary | ICD-10-CM

## 2014-11-16 NOTE — Progress Notes (Signed)
Subjective:    Patient ID: Briana Smith, female    DOB: Oct 14, 1940, 74 y.o.   MRN: [id number]  HPI Briana Smith is a 74 year old female nonsmoker who comes in today for general physical examination because of a history of underlying hypertension and breast cancer  She takes Zestoretic 20-12 0.5 one half tab daily BP 130/80  She is currently on arm index and a another medication to help because she's got bone loss from the arm index. She was found to have breast cancer and when she was 13 5 years ago on a mammogram. She had a lump removed from her left breast had 1 lymph node positive. She therefore it postop radiation and chemotherapy.  She gets routine eye care, dental care, ESV monthly, annual mammography, colonoscopy 2009 and GI was normal by Dr. Sharlett Iles. She said she had a couple polyps. She's not sure when she needs to go back for recall. Advised to call GI  Vaccinations up-to-date  Cognitive function normal she does not walk daily encouraged by me and Dr. Jannifer Rodney to exercise daily, home health safety reviewed no issues identified, no guns in the house, she does have a healthcare power of attorney and living well ,,,,,,,,,,   Review of Systems  Constitutional: Negative.   HENT: Negative.   Eyes: Negative.   Respiratory: Negative.   Cardiovascular: Negative.   Gastrointestinal: Negative.   Endocrine: Negative.   Genitourinary: Negative.   Musculoskeletal: Negative.   Skin: Negative.   Allergic/Immunologic: Negative.   Neurological: Negative.   Hematological: Negative.   Psychiatric/Behavioral: Negative.        Objective:   Physical Exam  Constitutional: She appears well-developed and well-nourished.  HENT:  Head: Normocephalic and atraumatic.  Right Ear: External ear normal.  Left Ear: External ear normal.  Nose: Nose normal.  Mouth/Throat: Oropharynx is clear and moist.  Eyes: EOM are normal. Pupils are equal, round, and reactive to light.  Neck: Normal range of motion.  Neck supple. No JVD present. No tracheal deviation present. No thyromegaly present.  Cardiovascular: Normal rate, regular rhythm, normal heart sounds and intact distal pulses.  Exam reveals no gallop and no friction rub.   No murmur heard. Pulmonary/Chest: Effort normal and breath sounds normal. No stridor. No respiratory distress. She has no wheezes. She has no rales. She exhibits no tenderness.  Abdominal: Soft. Bowel sounds are normal. She exhibits no distension and no mass. There is no tenderness. There is no rebound and no guarding.  Genitourinary:  Bilateral breast exam shows scar right breast 12:00 position above the breast where she had the Port-A-Cath. Scar left breast 6:00 position where she had a lumpectomy. Tattoos from previous radiation  No palpable masses  She had her uterus and ovaries removed 20 years ago for nonmalignant reasons therefore pelvic exams not indicated  Musculoskeletal: Normal range of motion.  Lymphadenopathy:    She has no cervical adenopathy.  Neurological: She is alert. She has normal reflexes. No cranial nerve deficit. She exhibits normal muscle tone. Coordination normal.  Skin: Skin is warm and dry. No rash noted. No erythema. No pallor.  Total body skin exam normal because she's had a history of skin cancer however she has 1 lesion on her right upper back that appears to be an irritated actinic keratosis. Advised to return for removal ASAP  Psychiatric: She has a normal mood and affect. Her behavior is normal. Judgment and thought content normal.  Nursing note and vitals reviewed.  AssessmentHealthy female  Hypertension at goal............. continue current therapy  Breast cancer left status post surgical removal radiation and chemotherapy 5 years........... continue follow-up in oncology by Dr. Jannifer Rodney  Osteoporosis.......... continue bone building exercise daily and medications  Abnormal appearing lesion on her back return for removal. &  Plan:

## 2014-11-16 NOTE — Patient Instructions (Signed)
Return sometime in the next 2-3 weeks for removal of the lesion on your back  Continue other medications  Follow-up in one year sooner if any problems  cory nafzinger........ is our new nurse practitioner from [place] whose joining [place] in April. He specializes in adults

## 2014-11-16 NOTE — Progress Notes (Signed)
Pre visit review using our clinic review tool, if applicable. No additional management support is needed unless otherwise documented below in the visit note. 

## 2014-12-27 ENCOUNTER — Other Ambulatory Visit: Payer: Self-pay | Admitting: Nurse Practitioner

## 2015-01-10 ENCOUNTER — Other Ambulatory Visit: Payer: Self-pay | Admitting: *Deleted

## 2015-01-10 DIAGNOSIS — C50512 Malignant neoplasm of lower-outer quadrant of left female breast: Secondary | ICD-10-CM

## 2015-01-11 ENCOUNTER — Other Ambulatory Visit (HOSPITAL_BASED_OUTPATIENT_CLINIC_OR_DEPARTMENT_OTHER): Payer: Medicare HMO

## 2015-01-11 DIAGNOSIS — C50512 Malignant neoplasm of lower-outer quadrant of left female breast: Secondary | ICD-10-CM | POA: Diagnosis not present

## 2015-01-11 LAB — COMPREHENSIVE METABOLIC PANEL (CC13)
ALK PHOS: 53 U/L (ref 40–150)
ALT: 17 U/L (ref 0–55)
AST: 14 U/L (ref 5–34)
Albumin: 3.7 g/dL (ref 3.5–5.0)
Anion Gap: 9 mEq/L (ref 3–11)
BILIRUBIN TOTAL: 0.32 mg/dL (ref 0.20–1.20)
BUN: 20.2 mg/dL (ref 7.0–26.0)
CO2: 26 meq/L (ref 22–29)
Calcium: 9 mg/dL (ref 8.4–10.4)
Chloride: 105 mEq/L (ref 98–109)
Creatinine: 0.8 mg/dL (ref 0.6–1.1)
EGFR: 69 mL/min/{1.73_m2} — AB (ref >90)
GLUCOSE: 88 mg/dL (ref 70–140)
POTASSIUM: 4.2 meq/L (ref 3.5–5.1)
SODIUM: 139 meq/L (ref 136–145)
TOTAL PROTEIN: 7 g/dL (ref 6.4–8.3)

## 2015-01-11 LAB — CBC WITH DIFFERENTIAL/PLATELET
BASO%: 0.6 % (ref 0.0–2.0)
Basophils Absolute: 0 10*3/uL (ref 0.0–0.1)
EOS%: 2 % (ref 0.0–7.0)
Eosinophils Absolute: 0.1 10*3/uL (ref 0.0–0.5)
HEMATOCRIT: 38.2 % (ref 34.8–46.6)
HGB: 12.5 g/dL (ref 11.6–15.9)
LYMPH%: 22.4 % (ref 14.0–49.7)
MCH: 30.5 pg (ref 25.1–34.0)
MCHC: 32.7 g/dL (ref 31.5–36.0)
MCV: 93.2 fL (ref 79.5–101.0)
MONO#: 0.5 10*3/uL (ref 0.1–0.9)
MONO%: 9.1 % (ref 0.0–14.0)
NEUT#: 3.2 10*3/uL (ref 1.5–6.5)
NEUT%: 65.9 % (ref 38.4–76.8)
Platelets: 170 10*3/uL (ref 145–400)
RBC: 4.1 10*6/uL (ref 3.70–5.45)
RDW: 13.3 % (ref 11.2–14.5)
WBC: 4.9 10*3/uL (ref 3.9–10.3)
lymph#: 1.1 10*3/uL (ref 0.9–3.3)

## 2015-01-18 ENCOUNTER — Other Ambulatory Visit: Payer: Medicare HMO

## 2015-01-18 ENCOUNTER — Ambulatory Visit (HOSPITAL_BASED_OUTPATIENT_CLINIC_OR_DEPARTMENT_OTHER): Payer: Medicare HMO

## 2015-01-18 ENCOUNTER — Telehealth: Payer: Self-pay | Admitting: Nurse Practitioner

## 2015-01-18 ENCOUNTER — Ambulatory Visit (HOSPITAL_BASED_OUTPATIENT_CLINIC_OR_DEPARTMENT_OTHER): Payer: Medicare HMO | Admitting: Oncology

## 2015-01-18 VITALS — BP 139/69 | HR 70 | Temp 98.0°F | Resp 18 | Ht 60.25 in | Wt 178.6 lb

## 2015-01-18 DIAGNOSIS — M8580 Other specified disorders of bone density and structure, unspecified site: Secondary | ICD-10-CM

## 2015-01-18 DIAGNOSIS — I10 Essential (primary) hypertension: Secondary | ICD-10-CM

## 2015-01-18 DIAGNOSIS — Z170 Estrogen receptor positive status [ER+]: Secondary | ICD-10-CM

## 2015-01-18 DIAGNOSIS — C50512 Malignant neoplasm of lower-outer quadrant of left female breast: Secondary | ICD-10-CM

## 2015-01-18 DIAGNOSIS — Z85828 Personal history of other malignant neoplasm of skin: Secondary | ICD-10-CM

## 2015-01-18 MED ORDER — SODIUM CHLORIDE 0.9 % IV SOLN
Freq: Once | INTRAVENOUS | Status: AC
  Administered 2015-01-18: 13:00:00 via INTRAVENOUS

## 2015-01-18 MED ORDER — ZOLEDRONIC ACID 4 MG/100ML IV SOLN
4.0000 mg | Freq: Once | INTRAVENOUS | Status: AC
  Administered 2015-01-18: 4 mg via INTRAVENOUS
  Filled 2015-01-18: qty 100

## 2015-01-18 NOTE — Patient Instructions (Signed)

## 2015-01-18 NOTE — Progress Notes (Signed)
Hematology and Oncology Follow Up Visit  Briana Smith [id number] 07-22-1941 74 y.o. 01/18/2015   PCP: Joycelyn Man, MD GYN: SU:  OTHER MD: Carmon Sails, [name], Ethelene Hal  CHIEF COMPLAINT: Estrogen receptor positive breast cancer  CURRENT TREATMENT: Anastrozole  HPI: From the original intake note:  Claryssa had screening mammography August 14, 2010 at the [facility] showing a possible mass in the left breast.  She was brought back for additional views November 22 and Dr. Sadie Haber was able to confirm an ill defined mass in the inferior portion of the left breast which was not palpable.  Ultrasound showed this to be irregularly marginated and to measure approximately 1 cm.  The left axilla was unremarkable.  Biopsy was performed the same day and showed ([id number]) an intermediate grade invasive ductal carcinoma which was estrogen receptor positive at 100%, progesterone receptor positive at 99% with an elevated MIB-1 at 44%, but no HER2/neu amplification by CISH with a ratio of 1.62.  Bilateral breast MRIs were obtained on September 04, 2010.  This confirmed a 1.7 cm irregularly marginated mass in the left breast consistent with the previously diagnosed cancer.  They could also see the clip from her prior benign left breast biopsy obtained in 2000.   However, to complicate things there was an area in the left and upper manubrium with abnormal signal intensity.  It was felt that a further evaluation would be helpful and the patient had a bone scan on December 7.  This showed a strong focus of uptake in the central upper sternum and a second one at the sternal manubrial junction slightly to the left.  There was also a symmetric increased uptake in the right proximal humerus and a smaller area of uptake again rather faint in the left proximal femur.  This was felt to be most consistent with metastatic disease.  The patient then saw Dr. Humphrey Rolls in our office and she discussed her  disease in terms of stage IV breast cancer.   Dr. Humphrey Rolls set the patient up for a PET scan which was performed on December 12 with associated CT scan.  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  There was some fluid within the joints as well on MRI review.  Similarly, with the lesion in the sternomanubrial joints, there was an SUV maximum of 3.1 and significant sclerosis more consistent with arthropathy.  The increased uptake in the right proximal humerus could not be reproduced by CT or PET.  The breast primary had an SUV of 1.8 and measured 1.0 cm on these images.  There was no adenopathy in the axilla or chest and the rest of the CT/PET was negative except for an 8 mm right thyroid nodule which will require a work up at some point seen only the CT scan, not on the PET scan.  There was a nonhypermetabolic right hepatic lobe lesion felt to be most consistent with a complex cyst or hemangioma.  There was also hemangioma in the L3 vertebral body and in other, thoracic vertebrae.  Finally, there was an exophytic upper pole left renal lesion felt to be most likely a cyst.  The patient then sought a second opinion under Dr. Gypsy Balsam at [facility] and she was seen there October 14, 2010.  Dr. Philipp Ovens repeated the bone scan and PET scan and proceeded to obtain plain  films of the left hip and proximal humerus. Those studies confirmed that the patient essentially she had some arthritis but no evidence of metastatic disease. The patient then proceeded to left partial mastectomy with sentinel lymph node sampling October 23, 2010.  The results from that procedure ([id number]) showed a 1.2 cm grade 2 invasive ductal carcinoma with 1 of 2 sentinel lymph nodes involved.  Margins were negative.  There was evidence of lymphovascular invasion.  HER2/neu CISH was repeated and was again negative with the ratio of  1.58.  Jini received adjuvant chemotherapy consisting of 4 cycles of docetaxel and cyclophosphamide. Chemotherapy was completed in May of 2012 and was followed by radiation therapy. Radiation was completed in mid August 2012, at which time patient began on tamoxifen, 20 mg daily.   Her subsequent history is as detailed below.  Interim History:   Alexya returns today for followup of her breast cancer. She continues on anastrozole, generally with good tolerance. She is having more arthritis in her hands particularly the base of both thumbs. She wonders if the anastrozole could be contributing to that and it certainly could. Vaginal dryness and hot flashes are not major issues.  Review of Systems: She is having significant cramps particular only when sitting down in a hard chair. They don't occur at night. She is having some seasonal allergy symptoms. She has significant heartburn problems. Aside from these issues a detailed review of systems today was stable.   FAMILY HISTORY:  The patient's father died at the age of 28 from COPD/emphysema in the setting of tobacco abuse.  The patient's mother died at the age of 16 in the setting of dementia.  The patient has one brother with a history of neck cancer.  There is no breast or ovarian cancer in the family to her knowledge.  GYNECOLOGIC HISTORY:  She is Gx, P3, menarche age 71 and again status post TAH-BSO in 41 after which she took hormone replacement for approximately 24 years.  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  Daughter Santiago Glad lives in [place] and she works as a Software engineer.  Daughter [name] lives in [place] and works as a Music therapist Son Lanny Hurst lives in [place] and works for [employer].  The patient has three grandchildren.  She attends the [employer].  Medications:   Current Outpatient Prescriptions  Medication Sig Dispense Refill   . anastrozole (ARIMIDEX) 1 MG tablet TAKE ONE TABLET BY MOUTH ONCE DAILY 30 tablet 5  . aspirin 81 MG tablet Take 81 mg by mouth daily.    Marland Kitchen CALCIUM PO Take by mouth.      . Cholecalciferol (D-3-5 PO) Take by mouth.      Marland Kitchen lisinopril-hydrochlorothiazide (PRINZIDE,ZESTORETIC) 20-12.5 MG per tablet One half tab every morning 50 tablet 3  . MAGNESIUM PO Take by mouth daily.      . Multiple Vitamin (MULTIVITAMIN) tablet Take 1 tablet by mouth daily.      . potassium chloride (K-DUR) 10 MEQ tablet Take 10 mEq by mouth daily.    Marland Kitchen pyridoxine (B-6) 100 MG tablet Take 100 mg by mouth daily.    . timolol (TIMOPTIC) 0.5 % ophthalmic solution BID times 48H.     No current facility-administered medications for this visit.    Allergies:  Allergies  Allergen Reactions  . Peanut-Containing Drug Products     All nuts cause fever blisters  Physical Exam: Middle-aged white woman who appears stated age 57 Vitals:   01/18/15 1103  BP: 139/69  Pulse: 70  Temp: 98 F (36.7 C)  Resp: 18  Body mass index is 34.61 kg/(m^2). Filed Weights   01/18/15 1103  Weight: 178 lb 9.6 oz (81.012 kg)   ECOG:  1   Sclerae unicteric, pupils round and equal Oropharynx clear, dentition in good repair No cervical or supraclavicular adenopathy Lungs no rales or rhonchi Heart regular rate and rhythm Abd soft, obese, nontender, positive bowel sounds MSK no focal spinal tenderness, no upper extremity lymphedema Neuro: nonfocal, well oriented, appropriate affect Breasts: Right breast is unremarkable. The left breast is status post radiation and lumpectomy. There is no evidence of chest wall recurrence. The left axilla is benign.   Lab Results: Lab Results  Component Value Date   WBC 4.9 01/11/2015   HGB 12.5 01/11/2015   HCT 38.2 01/11/2015   MCV 93.2 01/11/2015   PLT 170 01/11/2015   NEUTROABS 3.2 01/11/2015     Chemistry      Component Value Date/Time   NA 139 01/11/2015 1025   NA 140  11/09/2014 0818   K 4.2 01/11/2015 1025   K 4.6 11/09/2014 0818   CL 105 11/09/2014 0818   CL 104 01/13/2013 1101   CO2 26 01/11/2015 1025   CO2 28 11/09/2014 0818   BUN 20.2 01/11/2015 1025   BUN 21 11/09/2014 0818   CREATININE 0.8 01/11/2015 1025   CREATININE 0.93 11/09/2014 0818      Component Value Date/Time   CALCIUM 9.0 01/11/2015 1025   CALCIUM 9.5 11/09/2014 0818   ALKPHOS 53 01/11/2015 1025   ALKPHOS 43 11/09/2014 0818   AST 14 01/11/2015 1025   AST 15 11/09/2014 0818   ALT 17 01/11/2015 1025   ALT 17 11/09/2014 0818   BILITOT 0.32 01/11/2015 1025   BILITOT 0.4 11/09/2014 0818        Radiological Studies:  Study Result     CLINICAL DATA: Status post left lumpectomy, radiation therapy and chemotherapy for breast cancer in 2012.  EXAM: DIGITAL DIAGNOSTIC BILATERAL MAMMOGRAM WITH 3D TOMOSYNTHESIS AND CAD  COMPARISON: Previous examinations.  ACR Breast Density Category b: There are scattered areas of fibroglandular density.  FINDINGS: Stable post radiation skin thickening on the left. No new findings suspicious for malignancy in either breast.  Mammographic images were processed with CAD.  IMPRESSION: No evidence of malignancy.  RECOMMENDATION: Bilateral diagnostic mammogram in 1 year.  I have discussed the findings and recommendations with the patient. Results were also provided in writing at the conclusion of the visit. If applicable, a reminder letter will be sent to the patient regarding the next appointment.  BI-RADS CATEGORY 2: Benign.   Electronically Signed  By: Enrique Sack M.D.  On: 09/20/2014 11:25      Assessment:  74 y.o.  [place] woman   (1) status post left lumpectomy and sentinel lymph node sampling January 2012 for a pT1c pN1, stage IIB  invasive ductal carcinoma, grade 2,  strongly estrogen and progesterone receptor positive, HER2 negative, MIB-1 of 44%,  (2) treated adjuvantly with docetaxel and  cyclophosphamide x4 completed May 2012   (3) radiation completed August 2012.   (4) began on tamoxifen in August 2012, switched to anastrozole 10/06/2013  (5) DEXA scan 08/31/2013 shows osteopenia with a T score of -2.0; zolendronate started April 2015, to be repeated every 6 months while on anastrozole.  Plan:  Minha is doing very well  from a breast cancer point of view, now more than 4 years out from her definitive surgery. She is doing well with the anastrozole. It is true that she is having significant hand problems. This could be due to the aromatase inhibitors or could be made worse by the aromatase inhibitors. However I would like her to continue on anastrozole at least through January 2017 to make 2 years. Of course she is to stay on anti-estrogens all the way through August of next year, but we could switch to tamoxifen in January of next year if she wishes.  She is receiving zolendronate because of her significant osteopenia. She will have a repeat bone density December of this year. I expected to be improved.  I suggested she try Prilosec for her significant reflux problems. As far as her cramps are concerned she is going to make sure to drink between one half and 2 quarts of liquid a day and to take her potassium regularly.  Otherwise she will return in 6 months for her next evaluation and zolendronate treatment. She knows to call for any problems that may develop before then. Chauncey Cruel, MD  01/18/2015

## 2015-01-18 NOTE — Telephone Encounter (Signed)
Appointments made and avs printed for patient °

## 2015-01-19 ENCOUNTER — Other Ambulatory Visit: Payer: Self-pay | Admitting: Family Medicine

## 2015-02-03 ENCOUNTER — Other Ambulatory Visit: Payer: Self-pay | Admitting: Oncology

## 2015-02-03 DIAGNOSIS — C50512 Malignant neoplasm of lower-outer quadrant of left female breast: Secondary | ICD-10-CM

## 2015-02-05 NOTE — Telephone Encounter (Signed)
Pt called requesting anastrazole refill. Called pt back it was refilled

## 2015-02-06 ENCOUNTER — Telehealth: Payer: Self-pay

## 2015-02-06 NOTE — Telephone Encounter (Signed)
Offce Notes dtd 01/30/15 rcvd from [facility].  Reviewed by Dr. Jana Hakim.  Sent to scan.

## 2015-02-26 ENCOUNTER — Ambulatory Visit (INDEPENDENT_AMBULATORY_CARE_PROVIDER_SITE_OTHER): Payer: Medicare HMO | Admitting: Adult Health

## 2015-02-26 ENCOUNTER — Encounter: Payer: Self-pay | Admitting: Adult Health

## 2015-02-26 VITALS — BP 120/68 | Temp 98.7°F | Ht 60.25 in | Wt 183.0 lb

## 2015-02-26 DIAGNOSIS — T148 Other injury of unspecified body region: Secondary | ICD-10-CM

## 2015-02-26 DIAGNOSIS — W57XXXA Bitten or stung by nonvenomous insect and other nonvenomous arthropods, initial encounter: Secondary | ICD-10-CM

## 2015-02-26 MED ORDER — DOXYCYCLINE HYCLATE 100 MG PO CAPS: 100.0000 mg | ORAL_CAPSULE | Freq: Two times a day (BID) | ORAL | Status: DC

## 2015-02-26 NOTE — Progress Notes (Signed)
Subjective:    Patient ID: [name], female    DOB: 09-06-1941, 74 y.o.   MRN: [id number]  HPI  Patient believes she was bit by a tick on 02/14/2015 and first noticed that she was itching on her left breast on Friday, when she examined her breast, she found one small tick on the underside of it.  She noticed a rash a few days later. The rash has stayed the same and has not gotten any worse or any better. Rash is localized to left breast.  Has tried hydrocortisone cream on rash with minimal results.    Denies any fever, nausea, vomiting, diarrhea,muscle pain or headache.    Review of Systems  Constitutional: Negative for fever, chills and fatigue.  Cardiovascular: Negative for chest pain and palpitations.  Gastrointestinal: Positive for nausea. Negative for vomiting and diarrhea.  Skin: Positive for rash.       Left breast  All other systems reviewed and are negative.  Past Medical History  Diagnosis Date  . Hypertension   . Heart murmur   . Hyperlipidemia   . Cancer     breast cancer- left    History   Social History  . Marital Status: Married    Spouse Name: N/A  . Number of Children: N/A  . Years of Education: N/A   Occupational History  . Not on file.   Social History Main Topics  . Smoking status: Never Smoker   . Smokeless tobacco: Never Used  . Alcohol Use: No  . Drug Use: No  . Sexual Activity: Not on file   Other Topics Concern  . Not on file   Social History Narrative    Past Surgical History  Procedure Laterality Date  . Abdominal hysterectomy  1987  . Breast lumpectomy  10/23/10    left  . Port-a-cath removal  09/02/2011    Procedure: REMOVAL PORT-A-CATH;  Surgeon: Adin Hector, MD;  Location: [place];  Service: General;  Laterality: Right;    Family History  Problem Relation Age of Onset  . COPD Father   . Cancer Brother     jaw    Allergies  Allergen Reactions  . Peanut-Containing Drug Products     All  nuts cause fever blisters    Current Outpatient Prescriptions on File Prior to Visit  Medication Sig Dispense Refill  . anastrozole (ARIMIDEX) 1 MG tablet Take 1 tablet (1 mg total) by mouth daily. 30 tablet 5  . aspirin 81 MG tablet Take 81 mg by mouth daily.    Marland Kitchen CALCIUM PO Take by mouth.      . Cholecalciferol (D-3-5 PO) Take by mouth.      Marland Kitchen lisinopril-hydrochlorothiazide (PRINZIDE,ZESTORETIC) 20-12.5 MG per tablet TAKE ONE-HALF TABLET BY MOUTH IN THE MORNING 50 tablet 3  . MAGNESIUM PO Take by mouth daily.      . potassium chloride (K-DUR) 10 MEQ tablet Take 10 mEq by mouth daily.    Marland Kitchen pyridoxine (B-6) 100 MG tablet Take 100 mg by mouth daily.    . timolol (TIMOPTIC) 0.5 % ophthalmic solution BID times 48H.    . Multiple Vitamin (MULTIVITAMIN) tablet Take 1 tablet by mouth daily.       No current facility-administered medications on file prior to visit.    BP 120/68 mmHg  Temp(Src) 98.7 F (37.1 C) (Oral)  Ht 5' 0.25" (1.53 m)  Wt 183 lb (83.008 kg)  BMI 35.46 kg/m2  Objective:   Physical Exam  Constitutional: She is oriented to person, place, and time. She appears well-developed and well-nourished.  Cardiovascular: Normal rate, regular rhythm, normal heart sounds and intact distal pulses.  Exam reveals no gallop and no friction rub.   No murmur heard. Pulmonary/Chest: Effort normal and breath sounds normal. No respiratory distress. She has no wheezes. She has no rales. She exhibits no tenderness.  Musculoskeletal: Normal range of motion. She exhibits no edema or tenderness.  Neurological: She is alert and oriented to person, place, and time.  Skin: Skin is warm and dry. Rash (red macularpapular rash that it localized to left breast) noted. No erythema.  No rash on feet, ankles, palms or trunk  Psychiatric: She has a normal mood and affect. Her behavior is normal. Judgment and thought content normal.  Nursing note and vitals reviewed.      Assessment & Plan:  1.  Tick bite - doxycycline (VIBRAMYCIN) 100 MG capsule; Take 1 capsule (100 mg total) by mouth 2 (two) times daily.  Dispense: 20 capsule; Refill: 0 - Patient educated on signs and symptoms of RMSF and Lyme Disease. She will follow up if she develops any of these.  - Follow up in 2-3 days if no improvement or sooner if needed.

## 2015-02-26 NOTE — Patient Instructions (Addendum)
Take the Doxycycline twice a day for 10 days. You can use the cortisone cream or benadryl cream for the itching. Follow up with me for any signs or symptoms of Lyme Disease or [facility] Spotted Fever ( see attached). Also, follow up with me if no improvement in 2-3 days.   Lyme Disease You may have been bitten by a tick and are to watch for the development of Lyme Disease. Lyme Disease is an infection that is caused by a bacteria The bacteria causing this disease is named Borreilia burgdorferi. If a tick is infected with this bacteria and then bites you, then Lyme Disease may occur. These ticks are carried by deer and rodents such as rabbits and mice and infest grassy as well as forested areas. Fortunately most tick bites do not cause Lyme Disease.  Lyme Disease is easier to prevent than to treat. First, covering your legs with clothing when walking in areas where ticks are possibly abundant will prevent their attachment because ticks tend to stay within inches of the ground. Second, using insecticides containing DEET can be applied on skin or clothing. Last, because it takes about 12 to 24 hours for the tick to transmit the disease after attachment to the human host, you should inspect your body for ticks twice a day when you are in areas where Lyme Disease is common. You must look thoroughly when searching for ticks. The Ixodes tick that carries Lyme Disease is very small. It is around the size of a sesame seed (picture of tick is not actual size). Removal is best done by grasping the tick by the head and pulling it out. Do not to squeeze the body of the tick. This could inject the infecting bacteria into the bite site. Wash the area of the bite with an antiseptic solution after removal.  Lyme Disease is a disease that may affect many body systems. Because of the small size of the biting tick, most people do not notice being bitten. The first sign of an infection is usually a round red rash that extends  out from the center of the tick bite. The center of the lesion may be blood colored (hemorrhagic) or have tiny blisters (vesicular). Most lesions have bright red outer borders and partial central clearing. This rash may extend out many inches in diameter, and multiple lesions may be present. Other symptoms such as fatigue, headaches, chills and fever, general achiness and swelling of lymph glands may also occur. If this first stage of the disease is left untreated, these symptoms may gradually resolve by themselves, or progressive symptoms may occur because of spread of infection to other areas of the body.  Follow up with your caregiver to have testing and treatment if you have a tick bite and you develop any of the above complaints. Your caregiver may recommend preventative (prophylactic) medications which kill bacteria (antibiotics). Once a diagnosis of Lyme Disease is made, antibiotic treatment is highly likely to cure the disease. Effective treatment of late stage Lyme Disease may require longer courses of antibiotic therapy.  MAKE SURE YOU:   Understand these instructions.  Will watch your condition.  Will get help right away if you are not doing well or get worse. Document Released: 12/29/2000 Document Revised: 12/15/2011 Document Reviewed: 03/02/2009 [facility] Patient Information 2015 [place], [place]. This information is not intended to replace advice given to you by your health care provider. Make sure you discuss any questions you have with your health care provider. [facility]  Spotted Fever [place] Spotted Fever (RMSF) is the oldest known tick-borne disease of people in the [place]. This disease was named because it was first described among people in the [facility] area who had an illness characterized by a rash with red-purple-black spots. This disease is caused by a rickettsia (Rickettsia rickettsii), a bacteria carried by the tick. The [facility] wood tick and the  American dog tick acquire and transmit the RMSF bacteria (pictures NOT actual size). When a larval, nymphal, or adult tick feeds on an infected rodent or larger animal, the tick can become infected. Infected adult ticks then feed on people who may then get RMSF. The tick transmits the disease to humans during a prolonged period of feeding that lasts many hours, days, or even a couple weeks. The bite is painless and frequently goes unnoticed. An infected female tick may also pass the rickettsial bacteria to her eggs that then may mature to be infected adult ticks. The rickettsia that causes RMSF can also get into a person's body through damaged skin. A tick bite is not necessary. People can get RMSF if they crush a tick and get its blood or body fluids on their skin through a small cut or sore.  DIAGNOSIS Diagnosis is made by laboratory tests.  TREATMENT Treatment is with antibiotics (medications that kill rickettsia and other bacteria). Immediate treatment usually prevents death. GEOGRAPHIC RANGE This disease was reported only in the [facility] until 1931. RMSF has more recently been described among individuals in all states except [place], [place], and [place]. The highest reported incidences of RMSF now occur among residents of [place], [place], [place], and the [place]. TIME OF YEAR  Most cases are diagnosed during late spring and summer when ticks are most active. However, especially in the warmer [place] states, a few cases occur during the winter. SYMPTOMS   Symptoms of RMSF begin from 2 to 14 days after a tick bite. The most common early symptoms are fever, muscle aches, and headache followed by nausea (feeling sick to your stomach) or vomiting.  The RMSF rash is typically delayed until 3 or more days after symptom onset, and eventually develops in 9 of 10 infected patients by the fifth day of illness. If the disease is not treated it can cause death. If you get a fever, headache,  muscle aches, rash, nausea, or vomiting within 2 weeks of a possible tick bite or exposure, you should see your caregiver immediately. PREVENTION Ticks prefer to hide in shady, moist ground litter. They can often be found above the ground clinging to tall grass, brush, shrubs and low tree branches. They also inhabit lawns and gardens, especially at the edges of woodlands and around old stone walls. Within the areas where ticks generally live, no naturally vegetated area can be considered completely free of infected ticks. The best precaution against RMSF is to avoid contact with soil, leaf litter, and vegetation as much as possible in tick-infested areas. For those who enjoy gardening or walking in their yards, clear brush and mow tall grass around houses and at the edges of gardens. This may help reduce the tick population in the immediate area. Applications of chemical insecticides by a licensed professional in the spring (late May) and fall (September) will also control ticks, especially in heavily infested areas. Treatment will never get rid of all the ticks. Getting rid of small animal populations that host ticks will also decrease the tick population. When working in the garden, [facility],  or handling soil and vegetation, wear light-colored protective clothing and gloves. Spot-check often to prevent ticks from reaching the skin. Ticks cannot jump or fly. They will not drop from an above-ground perch onto a passing animal. Once a tick gains access to human skin it climbs upward until it reaches a more protected area. For example, the back of the knee, groin, navel, armpit, ears, or nape of the neck. It then begins the slow process of embedding itself in the skin. Campers, hikers, field workers, and others who spend time in wooded, brushy, or tall grassy areas can avoid exposure to ticks by using the following precautions:  Wear light-colored clothing with a tight weave to spot ticks more easily and  prevent contact with the skin.  Wear long pants tucked into socks, long-sleeved shirts tucked into pants and enclosed shoes or boots along with insect repellent.  Spray clothes with insect repellent containing either DEET or Permethrin. Only DEET can be used on exposed skin. Follow the manufacturer's directions carefully.  Wear a hat and keep long hair pulled back.  Stay on cleared, well-worn trails whenever possible.  Spot-check yourself and others often for the presence of ticks on clothes. If you find one, there are likely to be others. Check thoroughly.  Remove clothes after leaving tick-infested areas. If possible, wash them to eliminate any unseen ticks. Check yourself, your children and any pets from head to toe for the presence of ticks.  Shower and shampoo. You can greatly reduce your chances of contracting RMSF if you remove attached ticks as soon as possible. Regular checks of the body, including all body sites covered by hair (head, armpits, genitals), allow removal of the tick before rickettsial transmission. To remove an attached tick, use a forceps or tweezers to detach the intact tick without leaving mouth parts in the skin. The tick bite wound should be cleansed after tick removal. Remember the most common symptoms of RMSF are fever, muscle aches, headache, and nausea or vomiting with a later onset of rash. If you get these symptoms after a tick bite and while living in an area where RMSF is found, RMSF should be suspected. If the disease is not treated, it can cause death. See your caregiver immediately if you get these symptoms. Do this even if not aware of a tick bite. Document Released: 01/04/2001 Document Revised: 02/06/2014 Document Reviewed: 08/27/2009 [facility] Patient Information 2015 [place], [place]. This information is not intended to replace advice given to you by your health care provider. Make sure you discuss any questions you have with your health care provider.

## 2015-02-26 NOTE — Progress Notes (Signed)
Pre visit review using our clinic review tool, if applicable. No additional management support is needed unless otherwise documented below in the visit note. 

## 2015-03-19 ENCOUNTER — Inpatient Hospital Stay (HOSPITAL_COMMUNITY)
Admission: EM | Admit: 2015-03-19 | Discharge: 2015-03-23 | DRG: 601 | Disposition: A | Discharge status: Home / Self Care | Payer: Medicare HMO | Attending: Internal Medicine | Admitting: Internal Medicine

## 2015-03-19 ENCOUNTER — Encounter (HOSPITAL_COMMUNITY): Payer: Self-pay

## 2015-03-19 DIAGNOSIS — D696 Thrombocytopenia, unspecified: Secondary | ICD-10-CM | POA: Diagnosis not present

## 2015-03-19 DIAGNOSIS — N61 Inflammatory disorders of breast: Principal | ICD-10-CM | POA: Diagnosis present

## 2015-03-19 DIAGNOSIS — E785 Hyperlipidemia, unspecified: Secondary | ICD-10-CM | POA: Diagnosis present

## 2015-03-19 DIAGNOSIS — Z7982 Long term (current) use of aspirin: Secondary | ICD-10-CM

## 2015-03-19 DIAGNOSIS — Z91010 Allergy to peanuts: Secondary | ICD-10-CM | POA: Diagnosis not present

## 2015-03-19 DIAGNOSIS — R739 Hyperglycemia, unspecified: Secondary | ICD-10-CM | POA: Diagnosis present

## 2015-03-19 DIAGNOSIS — D72829 Elevated white blood cell count, unspecified: Secondary | ICD-10-CM | POA: Diagnosis not present

## 2015-03-19 DIAGNOSIS — Z853 Personal history of malignant neoplasm of breast: Secondary | ICD-10-CM | POA: Diagnosis not present

## 2015-03-19 DIAGNOSIS — C50919 Malignant neoplasm of unspecified site of unspecified female breast: Secondary | ICD-10-CM | POA: Diagnosis not present

## 2015-03-19 DIAGNOSIS — M8580 Other specified disorders of bone density and structure, unspecified site: Secondary | ICD-10-CM | POA: Diagnosis present

## 2015-03-19 DIAGNOSIS — D6959 Other secondary thrombocytopenia: Secondary | ICD-10-CM | POA: Diagnosis present

## 2015-03-19 DIAGNOSIS — I10 Essential (primary) hypertension: Secondary | ICD-10-CM | POA: Diagnosis present

## 2015-03-19 DIAGNOSIS — L0390 Cellulitis, unspecified: Secondary | ICD-10-CM | POA: Diagnosis present

## 2015-03-19 DIAGNOSIS — Z90710 Acquired absence of both cervix and uterus: Secondary | ICD-10-CM | POA: Diagnosis not present

## 2015-03-19 DIAGNOSIS — M810 Age-related osteoporosis without current pathological fracture: Secondary | ICD-10-CM | POA: Diagnosis present

## 2015-03-19 LAB — CBC WITH DIFFERENTIAL/PLATELET
BASOS ABS: 0 10*3/uL (ref 0.0–0.1)
Basophils Relative: 0 % (ref 0–1)
EOS PCT: 0 % (ref 0–5)
Eosinophils Absolute: 0 10*3/uL (ref 0.0–0.7)
HEMATOCRIT: 38.9 % (ref 36.0–46.0)
Hemoglobin: 12.5 g/dL (ref 12.0–15.0)
LYMPHS ABS: 0.6 10*3/uL — AB (ref 0.7–4.0)
LYMPHS PCT: 4 % — AB (ref 12–46)
MCH: 29.9 pg (ref 26.0–34.0)
MCHC: 32.1 g/dL (ref 30.0–36.0)
MCV: 93.1 fL (ref 78.0–100.0)
MONO ABS: 0.8 10*3/uL (ref 0.1–1.0)
MONOS PCT: 5 % (ref 3–12)
Neutro Abs: 15.1 10*3/uL — ABNORMAL HIGH (ref 1.7–7.7)
Neutrophils Relative %: 91 % — ABNORMAL HIGH (ref 43–77)
Platelets: 145 10*3/uL — ABNORMAL LOW (ref 150–400)
RBC: 4.18 MIL/uL (ref 3.87–5.11)
RDW: 13.2 % (ref 11.5–15.5)
WBC: 16.6 10*3/uL — AB (ref 4.0–10.5)

## 2015-03-19 LAB — BASIC METABOLIC PANEL
ANION GAP: 8 (ref 5–15)
BUN: 22 mg/dL — ABNORMAL HIGH (ref 6–20)
CHLORIDE: 103 mmol/L (ref 101–111)
CO2: 25 mmol/L (ref 22–32)
Calcium: 9.3 mg/dL (ref 8.9–10.3)
Creatinine, Ser: 0.69 mg/dL (ref 0.44–1.00)
GFR calc Af Amer: >60 mL/min (ref >60)
Glucose, Bld: 138 mg/dL — ABNORMAL HIGH (ref 65–99)
Potassium: 3.7 mmol/L (ref 3.5–5.1)
SODIUM: 136 mmol/L (ref 135–145)

## 2015-03-19 MED ORDER — VITAMIN B-1 100 MG PO TABS
100.0000 mg | ORAL_TABLET | Freq: Every day | ORAL | Status: DC
  Administered 2015-03-21: 100 mg via ORAL
  Filled 2015-03-19 (×2): qty 1

## 2015-03-19 MED ORDER — POTASSIUM CHLORIDE ER 10 MEQ PO TBCR
10.0000 meq | EXTENDED_RELEASE_TABLET | Freq: Every day | ORAL | Status: DC
  Administered 2015-03-20 – 2015-03-23 (×4): 10 meq via ORAL
  Filled 2015-03-19 (×4): qty 1

## 2015-03-19 MED ORDER — HYDROCHLOROTHIAZIDE 12.5 MG PO CAPS
12.5000 mg | ORAL_CAPSULE | Freq: Every day | ORAL | Status: DC
  Administered 2015-03-20 – 2015-03-23 (×4): 12.5 mg via ORAL
  Filled 2015-03-19 (×4): qty 1

## 2015-03-19 MED ORDER — ONDANSETRON HCL 4 MG PO TABS: 4.0000 mg | ORAL_TABLET | Freq: Four times a day (QID) | ORAL | Status: DC | PRN

## 2015-03-19 MED ORDER — LISINOPRIL-HYDROCHLOROTHIAZIDE 20-12.5 MG PO TABS: 1.0000 | ORAL_TABLET | Freq: Every day | ORAL | Status: DC

## 2015-03-19 MED ORDER — FOLIC ACID 1 MG PO TABS
1.0000 mg | ORAL_TABLET | Freq: Every day | ORAL | Status: DC
  Administered 2015-03-20 – 2015-03-21 (×2): 1 mg via ORAL
  Filled 2015-03-19 (×2): qty 1

## 2015-03-19 MED ORDER — VANCOMYCIN HCL IN DEXTROSE 1-5 GM/200ML-% IV SOLN
1000.0000 mg | Freq: Once | INTRAVENOUS | Status: DC
  Administered 2015-03-19: 1000 mg via INTRAVENOUS
  Filled 2015-03-19: qty 200

## 2015-03-19 MED ORDER — TIMOLOL MALEATE 0.5 % OP SOLN
1.0000 [drp] | Freq: Two times a day (BID) | OPHTHALMIC | Status: DC
  Administered 2015-03-20 – 2015-03-23 (×8): 1 [drp] via OPHTHALMIC
  Filled 2015-03-19: qty 5

## 2015-03-19 MED ORDER — SODIUM CHLORIDE 0.9 % IV SOLN
INTRAVENOUS | Status: DC
  Administered 2015-03-20: via INTRAVENOUS
  Administered 2015-03-21: 1000 mL via INTRAVENOUS
  Administered 2015-03-21 – 2015-03-22 (×2): via INTRAVENOUS

## 2015-03-19 MED ORDER — ASPIRIN EC 81 MG PO TBEC
81.0000 mg | DELAYED_RELEASE_TABLET | Freq: Every day | ORAL | Status: DC
  Administered 2015-03-20 – 2015-03-23 (×4): 81 mg via ORAL
  Filled 2015-03-19 (×4): qty 1

## 2015-03-19 MED ORDER — MELOXICAM 7.5 MG PO TABS
7.5000 mg | ORAL_TABLET | Freq: Every day | ORAL | Status: DC
  Administered 2015-03-22 – 2015-03-23 (×2): 7.5 mg via ORAL
  Filled 2015-03-19 (×4): qty 1

## 2015-03-19 MED ORDER — VITAMIN B-6 100 MG PO TABS
100.0000 mg | ORAL_TABLET | Freq: Every day | ORAL | Status: DC
  Administered 2015-03-20 – 2015-03-23 (×4): 100 mg via ORAL
  Filled 2015-03-19 (×4): qty 1

## 2015-03-19 MED ORDER — LISINOPRIL 20 MG PO TABS
20.0000 mg | ORAL_TABLET | Freq: Every day | ORAL | Status: DC
  Administered 2015-03-20 – 2015-03-23 (×4): 20 mg via ORAL
  Filled 2015-03-19 (×4): qty 1

## 2015-03-19 MED ORDER — INSULIN ASPART 100 UNIT/ML ~~LOC~~ SOLN
0.0000 [IU] | Freq: Three times a day (TID) | SUBCUTANEOUS | Status: DC
Start: 2015-03-20 — End: 2015-03-22

## 2015-03-19 MED ORDER — ACETAMINOPHEN 500 MG PO TABS
1000.0000 mg | ORAL_TABLET | Freq: Once | ORAL | Status: AC
  Administered 2015-03-19: 1000 mg via ORAL
  Filled 2015-03-19: qty 2

## 2015-03-19 MED ORDER — ACETAMINOPHEN 325 MG PO TABS: 650.0000 mg | ORAL_TABLET | Freq: Four times a day (QID) | ORAL | Status: DC | PRN

## 2015-03-19 MED ORDER — ACETAMINOPHEN 650 MG RE SUPP: 650.0000 mg | Freq: Four times a day (QID) | RECTAL | Status: DC | PRN

## 2015-03-19 MED ORDER — ANASTROZOLE 1 MG PO TABS
1.0000 mg | ORAL_TABLET | Freq: Every day | ORAL | Status: DC
  Administered 2015-03-20 – 2015-03-23 (×4): 1 mg via ORAL
  Filled 2015-03-19 (×5): qty 1

## 2015-03-19 MED ORDER — ADULT MULTIVITAMIN W/MINERALS CH
1.0000 | ORAL_TABLET | Freq: Every day | ORAL | Status: DC
  Administered 2015-03-21: 1 via ORAL
  Filled 2015-03-19 (×2): qty 1

## 2015-03-19 MED ORDER — ONDANSETRON HCL 4 MG/2ML IJ SOLN
4.0000 mg | Freq: Once | INTRAMUSCULAR | Status: AC
  Administered 2015-03-19: 4 mg via INTRAVENOUS
  Filled 2015-03-19: qty 2

## 2015-03-19 MED ORDER — HEPARIN SODIUM (PORCINE) 5000 UNIT/ML IJ SOLN
5000.0000 [IU] | Freq: Three times a day (TID) | INTRAMUSCULAR | Status: DC
  Administered 2015-03-20 – 2015-03-23 (×11): 5000 [IU] via SUBCUTANEOUS
  Filled 2015-03-19 (×14): qty 1

## 2015-03-19 MED ORDER — ONDANSETRON HCL 4 MG/2ML IJ SOLN: 4.0000 mg | Freq: Four times a day (QID) | INTRAMUSCULAR | Status: DC | PRN

## 2015-03-19 NOTE — ED Notes (Signed)
Pt states she "feels bad" and tired. Pt had a tick bite on Feb 16, 2015. She was seen by PCP office 10 days later. Prescribed Doxycycline. After completed medication, she noticed her nipple itching and redness on the left breast.

## 2015-03-19 NOTE — H&P (Signed)
[facility] History and Physical  Briana Smith URK:[id number] DOB: [birth year] DOA: 03/19/2015  Referring physician: Montine Circle, PA PCP: Briana Man, MD   Chief Complaint: Cellulitis  HPI: Briana Smith is a 74 y.o. female with history of hyperension breast cancer presents with itching and cellulitis of the left breast. She had a tick bite in May. She was treated for the the tick bite with doxycycline. She states that over the last couple of days her breast was noticeably tender, She states that today she noticed her breast was increased redness and painful. She states that she has had itching when she had the initial tick bite. She states that there has been no drainage or discharge noted. She has noted a fever of a 100.63F. She states that she did not take any tylenol. She has noted chills and feeling cold. Patient states that she has noted a slight headache also. She has no cough or congestion. She does not smoke. She was recently at the beach and had been in the pools but not the ocean.   Review of Systems:  12 point ROS was performed and negative other than HPI   Past Medical History  Diagnosis Date  . Hypertension   . Heart murmur   . Hyperlipidemia   . Cancer     breast cancer- left   Past Surgical History  Procedure Laterality Date  . Abdominal hysterectomy  1987  . Breast lumpectomy  10/23/10    left  . Port-a-cath removal  09/02/2011    Procedure: REMOVAL PORT-A-CATH;  Surgeon: Adin Hector, MD;  Location: [place];  Service: General;  Laterality: Right;   Social History:  reports that she has never smoked. She has never used smokeless tobacco. She reports that she does not drink alcohol or use illicit drugs.  Allergies  Allergen Reactions  . Peanut-Containing Drug Products     All nuts cause fever blisters. *Pecans.      Family History  Problem Relation Age of Onset  . COPD Father   . Cancer Brother     jaw     Prior  to Admission medications   Medication Sig Start Date End Date Taking? Authorizing Provider  anastrozole (ARIMIDEX) 1 MG tablet Take 1 tablet (1 mg total) by mouth daily. 02/05/15  Yes Chauncey Cruel, MD  aspirin 81 MG tablet Take 81 mg by mouth daily.   Yes Dorena Cookey, MD  CALCIUM PO Take by mouth.     Yes Historical Provider, MD  Cholecalciferol (D-3-5 PO) Take by mouth.     Yes Historical Provider, MD  lisinopril-hydrochlorothiazide (PRINZIDE,ZESTORETIC) 20-12.5 MG per tablet TAKE ONE-HALF TABLET BY MOUTH IN THE MORNING 01/22/15  Yes Dorena Cookey, MD  MAGNESIUM PO Take by mouth daily.     Yes Historical Provider, MD  Meloxicam (MOBIC PO) Take by mouth.   Yes Historical Provider, MD  Multiple Vitamin (MULTIVITAMIN) tablet Take 1 tablet by mouth daily.     Yes Historical Provider, MD  potassium chloride (K-DUR) 10 MEQ tablet Take 10 mEq by mouth daily.   Yes Historical Provider, MD  pyridoxine (B-6) 100 MG tablet Take 100 mg by mouth daily.   Yes Historical Provider, MD  timolol (TIMOPTIC) 0.5 % ophthalmic solution BID times 48H. 06/11/11  Yes Historical Provider, MD  doxycycline (VIBRAMYCIN) 100 MG capsule Take 1 capsule (100 mg total) by mouth 2 (two) times daily. Patient not taking: Reported on 03/19/2015 02/26/15   Tommi Rumps  Nafziger, NP   Physical Exam: Filed Vitals:   03/19/15 2039 03/19/15 2220  BP: 169/71   Pulse: 100   Temp: 99.1 F (37.3 C) 100.4 F (38 C)  TempSrc: Oral Oral  Resp: 18   SpO2: 97%     Wt Readings from Last 3 Encounters:  02/26/15 83.008 kg (183 lb)  01/18/15 81.012 kg (178 lb 9.6 oz)  11/16/14 79.833 kg (176 lb)    General:  Appears calm and comfortable Eyes: PERRL, normal lids, irises & conjunctiva ENT: grossly normal hearing, lips & tongue Neck: no LAD, masses or thyromegaly Cardiovascular: RRR, no m/r/g. No LE edema. Respiratory: CTA bilaterally, no w/r/r. Normal respiratory effort. Abdomen: soft, ntnd Skin: Breast Exam performed (with Female  Chaperon RN) showing Left breast cellulitis. Also a small mole noted under the breast fold. Musculoskeletal: grossly normal tone BUE/BLE Psychiatric: grossly normal mood and affect Neurologic: grossly non-focal.          Labs on Admission:  Basic Metabolic Panel:  Recent Labs Lab 03/19/15 2156  NA 136  K 3.7  CL 103  CO2 25  GLUCOSE 138*  BUN 22*  CREATININE 0.69  CALCIUM 9.3   Liver Function Tests: No results for input(s): AST, ALT, ALKPHOS, BILITOT, PROT, ALBUMIN in the last 168 hours. No results for input(s): LIPASE, AMYLASE in the last 168 hours. No results for input(s): AMMONIA in the last 168 hours. CBC:  Recent Labs Lab 03/19/15 2156  WBC 16.6*  NEUTROABS 15.1*  HGB 12.5  HCT 38.9  MCV 93.1  PLT 145*   Cardiac Enzymes: No results for input(s): CKTOTAL, CKMB, CKMBINDEX, TROPONINI in the last 168 hours.  BNP (last 3 results) No results for input(s): BNP in the last 8760 hours.  ProBNP (last 3 results) No results for input(s): PROBNP in the last 8760 hours.  CBG: No results for input(s): GLUCAP in the last 168 hours.  Radiological Exams on Admission: No results found.   Assessment/Plan Active Problems:   Essential hypertension   Cellulitis of female breast   Hyperglycemia   1. Cellulitis of left breast -will start on vancocin and zosyn pharmacy to dose -cultures ordered  2. Tick Bite -RMSF and lyme titers ordered in ED -has already received doxycycline course  3. Hyperglycemia -will monitor FSBS -SSI as needed  4. Hypertension -monitor pressures -will continue with lisinopril/HCTZ    Code Status: Full Code (must indicate code status--if unknown or must be presumed, indicate so) DVT Prophylaxis:Heparin Family Communication: None (indicate person spoken with, if applicable, with phone number if by telephone) Disposition Plan: Home (indicate anticipated LOS)  Time spent: 58min  KHAN,SAADAT A [facility] Pager  [phone]

## 2015-03-19 NOTE — ED Notes (Signed)
Pt presents with c/o tick bite that occurred on her left breast approx one month ago. Pt reports that she was seen after the bite and placed on antibiotics but today her left breast is very swollen, hot to the touch, and red. Pt reports chills, slight fever in triage. Pt reports she also has a headache.

## 2015-03-19 NOTE — ED Provider Notes (Signed)
CSN: [id number]     Arrival date & time 03/19/15  1955 History   First MD Initiated Contact with Patient 03/19/15 2115     Chief Complaint  Patient presents with  . Tick bite      (Consider location/radiation/quality/duration/timing/severity/associated sxs/prior Treatment) HPI Comments: Patient with hypertension, hyperlipidemia, breast cancer in remission since 2012 presents to the emergency department with chief complaint of left breast pain and itching. She states that she was bitten by a tick about one month ago. States that she soon thereafter developed a rash on her left breast. She was prescribed doxycycline which she took for 10 days. She states that today she noticed that her breast had swollen up, and is moderately painful and pruritic.  She does not have any history of diabetes. There are no aggravating or alleviating factors. She denies fevers, chills, vomiting, or diarrhea. He has had some associated nausea.  The history is provided by the patient. No language interpreter was used.    Past Medical History  Diagnosis Date  . Hypertension   . Heart murmur   . Hyperlipidemia   . Cancer     breast cancer- left   Past Surgical History  Procedure Laterality Date  . Abdominal hysterectomy  1987  . Breast lumpectomy  10/23/10    left  . Port-a-cath removal  09/02/2011    Procedure: REMOVAL PORT-A-CATH;  Surgeon: Adin Hector, MD;  Location: [place];  Service: General;  Laterality: Right;   Family History  Problem Relation Age of Onset  . COPD Father   . Cancer Brother     jaw   History  Substance Use Topics  . Smoking status: Never Smoker   . Smokeless tobacco: Never Used  . Alcohol Use: No   OB History    No data available     Review of Systems  Constitutional: Negative for fever and chills.  Respiratory: Negative for shortness of breath.   Cardiovascular: Negative for chest pain.  Gastrointestinal: Positive for nausea. Negative for  vomiting, diarrhea and constipation.  Genitourinary: Negative for dysuria.  Skin: Positive for color change and rash.  All other systems reviewed and are negative.     Allergies  Peanut-containing drug products  Home Medications   Prior to Admission medications   Medication Sig Start Date End Date Taking? Authorizing Provider  anastrozole (ARIMIDEX) 1 MG tablet Take 1 tablet (1 mg total) by mouth daily. 02/05/15   Chauncey Cruel, MD  aspirin 81 MG tablet Take 81 mg by mouth daily.    Dorena Cookey, MD  CALCIUM PO Take by mouth.      Historical Provider, MD  Cholecalciferol (D-3-5 PO) Take by mouth.      Historical Provider, MD  doxycycline (VIBRAMYCIN) 100 MG capsule Take 1 capsule (100 mg total) by mouth 2 (two) times daily. 02/26/15   Dorothyann Peng, NP  lisinopril-hydrochlorothiazide (PRINZIDE,ZESTORETIC) 20-12.5 MG per tablet TAKE ONE-HALF TABLET BY MOUTH IN THE MORNING 01/22/15   Dorena Cookey, MD  MAGNESIUM PO Take by mouth daily.      Historical Provider, MD  Meloxicam (MOBIC PO) Take by mouth.    Historical Provider, MD  Multiple Vitamin (MULTIVITAMIN) tablet Take 1 tablet by mouth daily.      Historical Provider, MD  potassium chloride (K-DUR) 10 MEQ tablet Take 10 mEq by mouth daily.    Historical Provider, MD  pyridoxine (B-6) 100 MG tablet Take 100 mg by mouth daily.  Historical Provider, MD  timolol (TIMOPTIC) 0.5 % ophthalmic solution BID times 48H. 06/11/11   Historical Provider, MD   BP 169/71 mmHg  Pulse 100  Temp(Src) 99.1 F (37.3 C) (Oral)  Resp 18  SpO2 97% Physical Exam  Constitutional: She is oriented to person, place, and time. She appears well-developed and well-nourished.  HENT:  Head: Normocephalic and atraumatic.  Eyes: Conjunctivae and EOM are normal. Pupils are equal, round, and reactive to light.  Neck: Normal range of motion. Neck supple.  Cardiovascular: Normal rate and regular rhythm.  Exam reveals no gallop and no friction rub.   No murmur  heard. Pulmonary/Chest: Effort normal and breath sounds normal. No respiratory distress. She has no wheezes. She has no rales. She exhibits no tenderness.  Abdominal: Soft. Bowel sounds are normal. She exhibits no distension and no mass. There is no tenderness. There is no rebound and no guarding.  Musculoskeletal: Normal range of motion. She exhibits no edema or tenderness.  Neurological: She is alert and oriented to person, place, and time.  Skin: Skin is warm and dry. There is erythema.     Left breast cellulitis  Psychiatric: She has a normal mood and affect. Her behavior is normal. Judgment and thought content normal.  Nursing note and vitals reviewed.   ED Course  Procedures (including critical care time) Labs Review Results for orders placed or performed during the hospital encounter of 03/19/15  CBC with Differential/Platelet  Result Value Ref Range   WBC 16.6 (H) 4.0 - 10.5 K/uL   RBC 4.18 3.87 - 5.11 MIL/uL   Hemoglobin 12.5 12.0 - 15.0 g/dL   HCT 38.9 36.0 - 46.0 %   MCV 93.1 78.0 - 100.0 fL   MCH 29.9 26.0 - 34.0 pg   MCHC 32.1 30.0 - 36.0 g/dL   RDW 13.2 11.5 - 15.5 %   Platelets 145 (L) 150 - 400 K/uL   Neutrophils Relative % 91 (H) 43 - 77 %   Neutro Abs 15.1 (H) 1.7 - 7.7 K/uL   Lymphocytes Relative 4 (L) 12 - 46 %   Lymphs Abs 0.6 (L) 0.7 - 4.0 K/uL   Monocytes Relative 5 3 - 12 %   Monocytes Absolute 0.8 0.1 - 1.0 K/uL   Eosinophils Relative 0 0 - 5 %   Eosinophils Absolute 0.0 0.0 - 0.7 K/uL   Basophils Relative 0 0 - 1 %   Basophils Absolute 0.0 0.0 - 0.1 K/uL  Basic metabolic panel  Result Value Ref Range   Sodium 136 135 - 145 mmol/L   Potassium 3.7 3.5 - 5.1 mmol/L   Chloride 103 101 - 111 mmol/L   CO2 25 22 - 32 mmol/L   Glucose, Bld 138 (H) 65 - 99 mg/dL   BUN 22 (H) 6 - 20 mg/dL   Creatinine, Ser 0.69 0.44 - 1.00 mg/dL   Calcium 9.3 8.9 - 10.3 mg/dL   GFR calc non Af Amer >60 >60 mL/min   GFR calc Af Amer >60 >60 mL/min   Anion gap 8 5 - 15     No results found.   Imaging Review No results found.   EKG Interpretation None      MDM   Final diagnoses:  Cellulitis of female breast    Patient with rapidly spreading cellulitis left breast. We'll check basic labs. Anticipate admission and IV antibiotics.  Patient discussed with Dr. Johnney Killian, who agrees that admission is indicated given rapid spread of infection, now low grade  fever, and leukocytosis.  Will consult hospitalist and start Vanc.  Appreciate Dr. Maudie Mercury for admitting the patient.    Montine Circle, PA-C 03/19/15 2248  Charlesetta Shanks, MD 03/19/15 [phone]

## 2015-03-20 DIAGNOSIS — R739 Hyperglycemia, unspecified: Secondary | ICD-10-CM

## 2015-03-20 DIAGNOSIS — M8580 Other specified disorders of bone density and structure, unspecified site: Secondary | ICD-10-CM

## 2015-03-20 LAB — CBC
HEMATOCRIT: 34.2 % — AB (ref 36.0–46.0)
HEMOGLOBIN: 11.4 g/dL — AB (ref 12.0–15.0)
MCH: 31.1 pg (ref 26.0–34.0)
MCHC: 33.3 g/dL (ref 30.0–36.0)
MCV: 93.2 fL (ref 78.0–100.0)
PLATELETS: 141 10*3/uL — AB (ref 150–400)
RBC: 3.67 MIL/uL — AB (ref 3.87–5.11)
RDW: 13.3 % (ref 11.5–15.5)
WBC: 11 10*3/uL — ABNORMAL HIGH (ref 4.0–10.5)

## 2015-03-20 LAB — COMPREHENSIVE METABOLIC PANEL
ALBUMIN: 3.4 g/dL — AB (ref 3.5–5.0)
ALK PHOS: 35 U/L — AB (ref 38–126)
ALT: 16 U/L (ref 14–54)
AST: 19 U/L (ref 15–41)
Anion gap: 10 (ref 5–15)
BUN: 19 mg/dL (ref 6–20)
CALCIUM: 8.9 mg/dL (ref 8.9–10.3)
CO2: 27 mmol/L (ref 22–32)
CREATININE: 0.74 mg/dL (ref 0.44–1.00)
Chloride: 105 mmol/L (ref 101–111)
GFR calc Af Amer: >60 mL/min (ref >60)
GFR calc non Af Amer: >60 mL/min (ref >60)
GLUCOSE: 137 mg/dL — AB (ref 65–99)
POTASSIUM: 3.8 mmol/L (ref 3.5–5.1)
Sodium: 142 mmol/L (ref 135–145)
TOTAL PROTEIN: 6.3 g/dL — AB (ref 6.5–8.1)
Total Bilirubin: 0.7 mg/dL (ref 0.3–1.2)

## 2015-03-20 LAB — GLUCOSE, CAPILLARY
GLUCOSE-CAPILLARY: 116 mg/dL — AB (ref 65–99)
GLUCOSE-CAPILLARY: 107 mg/dL — AB (ref 65–99)
Glucose-Capillary: 105 mg/dL — ABNORMAL HIGH (ref 65–99)

## 2015-03-20 LAB — PROTIME-INR
INR: 1.04 (ref 0.00–1.49)
PROTHROMBIN TIME: 13.8 s (ref 11.6–15.2)

## 2015-03-20 LAB — TSH: TSH: 1.038 u[IU]/mL (ref 0.350–4.500)

## 2015-03-20 LAB — HIV ANTIBODY (ROUTINE TESTING W REFLEX): HIV SCREEN 4TH GENERATION: NONREACTIVE

## 2015-03-20 MED ORDER — VANCOMYCIN HCL 10 G IV SOLR
1500.0000 mg | INTRAVENOUS | Status: DC
  Administered 2015-03-20: 1500 mg via INTRAVENOUS
  Filled 2015-03-20: qty 1500

## 2015-03-20 MED ORDER — PIPERACILLIN-TAZOBACTAM 3.375 G IVPB 30 MIN: 3.3750 g | Freq: Once | INTRAVENOUS | Status: DC

## 2015-03-20 MED ORDER — VANCOMYCIN HCL IN DEXTROSE 1-5 GM/200ML-% IV SOLN: 1000.0000 mg | Freq: Once | INTRAVENOUS | Status: DC

## 2015-03-20 MED ORDER — PIPERACILLIN-TAZOBACTAM 3.375 G IVPB
3.3750 g | Freq: Three times a day (TID) | INTRAVENOUS | Status: DC
  Administered 2015-03-20 (×3): 3.375 g via INTRAVENOUS
  Filled 2015-03-20 (×4): qty 50

## 2015-03-20 MED ORDER — CEFAZOLIN SODIUM 1-5 GM-% IV SOLN
1.0000 g | Freq: Three times a day (TID) | INTRAVENOUS | Status: DC
  Administered 2015-03-20 – 2015-03-23 (×8): 1 g via INTRAVENOUS
  Filled 2015-03-20 (×9): qty 50

## 2015-03-20 NOTE — Progress Notes (Signed)
[facility] PROGRESS NOTE  [name] XKG:[id number] DOB: [birth year] DOA: 03/19/2015 PCP: Joycelyn Man, MD  Assessment/Plan: 1. Cellulitis of L breast 1. Pt was started on empiric vancomycin and zosyn 2. Presenting leukocytosis of 16.6k improved to 11.1k 3. Remains afebrile 4. Given nonpurulent cellulitis, consider transition to ancef 2. Tick bite 1. RMSF serologies pending 2. Lyme serologies pending 3. Received course of doxycycline as outpatient, as of 5/23 3. Hyperglycemia 1. On SSI coverage 2. Will check a1c - pending result 3. Glucose stable thus far 4. HTN 1. BP stable thus far 2. Pt is continued on ACEI and hctz 5. Osteopenia 6. DVT prophylaxis 1. Heparin subQ  Code Status: Full Family Communication: Pt in room Disposition Plan: Pending   Consultants:    Procedures:    Antibiotics:  Vancomycin 6/13>>>  Zosyn 6/13>>>  HPI/Subjective: No complaints.   Objective: Filed Vitals:   03/19/15 2339 03/20/15 0003 03/20/15 0538 03/20/15 1342  BP:  126/55 109/57 138/66  Pulse:  63 62 62  Temp:  100.3 F (37.9 C) 98.1 F (36.7 C) 98 F (36.7 C)  TempSrc:  Oral Oral Oral  Resp:  16 16 16   Height: 5' (1.524 m)     Weight: 80.287 kg (177 lb) 82.736 kg (182 lb 6.4 oz)    SpO2:  97% 98% 100%    Intake/Output Summary (Last 24 hours) at 03/20/15 1430 Last data filed at 03/20/15 1343  Gross per 24 hour  Intake  622.5 ml  Output   1900 ml  Net -1277.5 ml   Filed Weights   03/19/15 2339 03/20/15 0003  Weight: 80.287 kg (177 lb) 82.736 kg (182 lb 6.4 oz)    Exam:   General:  Awake, in nad  Cardiovascular: regular, s1, s2  Respiratory: normal resp effort, no wheezing  Abdomen: soft,nondistended  Musculoskeletal: perfused,no clubbing  Skin: confluent erythematous rash over L Breast w/o drainage or palpable induration   Data Reviewed: Basic Metabolic Panel:  Recent Labs Lab 03/19/15 2156 03/20/15 0506  NA 136 142  Smith 3.7  3.8  CL 103 105  CO2 25 27  GLUCOSE 138* 137*  BUN 22* 19  CREATININE 0.69 0.74  CALCIUM 9.3 8.9   Liver Function Tests:  Recent Labs Lab 03/20/15 0506  AST 19  ALT 16  ALKPHOS 35*  BILITOT 0.7  PROT 6.3*  ALBUMIN 3.4*   No results for input(s): LIPASE, AMYLASE in the last 168 hours. No results for input(s): AMMONIA in the last 168 hours. CBC:  Recent Labs Lab 03/19/15 2156 03/20/15 0506  WBC 16.6* 11.0*  NEUTROABS 15.1*  --   HGB 12.5 11.4*  HCT 38.9 34.2*  MCV 93.1 93.2  PLT 145* 141*   Cardiac Enzymes: No results for input(s): CKTOTAL, CKMB, CKMBINDEX, TROPONINI in the last 168 hours. BNP (last 3 results) No results for input(s): BNP in the last 8760 hours.  ProBNP (last 3 results) No results for input(s): PROBNP in the last 8760 hours.  CBG:  Recent Labs Lab 03/20/15 0734 03/20/15 1259  GLUCAP 116* 105*    No results found for this or any previous visit (from the past 240 hour(s)).   Studies: No results found.  Scheduled Meds: . anastrozole  1 mg Oral Daily  . aspirin EC  81 mg Oral Daily  . folic acid  1 mg Oral Daily  . heparin  5,000 Units Subcutaneous 3 times per day  . hydrochlorothiazide  12.5 mg Oral Daily  . insulin aspart  0-15 Units Subcutaneous TID WC  . lisinopril  20 mg Oral Daily  . meloxicam  7.5 mg Oral Daily  . multivitamin with minerals  1 tablet Oral Daily  . piperacillin-tazobactam (ZOSYN)  IV  3.375 g Intravenous 3 times per day  . potassium chloride  10 mEq Oral Daily  . pyridoxine  100 mg Oral Daily  . thiamine  100 mg Oral Daily  . timolol  1 drop Both Eyes BID  . vancomycin  1,500 mg Intravenous Q24H   Continuous Infusions: . sodium chloride 50 mL/hr at 03/20/15 0021    Active Problems:   Essential hypertension   Cellulitis of female breast   Hyperglycemia   Cellulitis   Briana Smith  [facility] Pager [phone]. If 7PM-7AM, please contact night-coverage at [email or web address], password  [facility] 03/20/2015, 2:30 PM  LOS: 1 day

## 2015-03-20 NOTE — Progress Notes (Signed)
ANTIBIOTIC CONSULT NOTE - INITIAL  Pharmacy Consult for Cefazolin Indication: Cellulitis  Allergies  Allergen Reactions  . Peanut-Containing Drug Products     All nuts cause fever blisters. *Pecans.      Patient Measurements: Height: 5' (152.4 cm) Weight: 182 lb 6.4 oz (82.736 kg) IBW/kg (Calculated) : 45.5  Vital Signs: Temp: 98 F (36.7 C) (06/14 1342) Temp Source: Oral (06/14 1342) BP: 138/66 mmHg (06/14 1342) Pulse Rate: 62 (06/14 1342) Intake/Output from previous day: 06/13 0701 - 06/14 0700 In: 382.5 [I.V.:282.5; IV Piggyback:100] Out: 900 [Urine:900] Intake/Output from this shift: Total I/O In: 803.3 [P.O.:360; I.V.:443.3] Out: 1000 [Urine:1000]  Labs:  Recent Labs  03/19/15 2156 03/20/15 0506  WBC 16.6* 11.0*  HGB 12.5 11.4*  PLT 145* 141*  CREATININE 0.69 0.74   Estimated Creatinine Clearance: 57 mL/min (by C-G formula based on Cr of 0.74). No results for input(s): VANCOTROUGH, VANCOPEAK, VANCORANDOM, GENTTROUGH, GENTPEAK, GENTRANDOM, TOBRATROUGH, TOBRAPEAK, TOBRARND, AMIKACINPEAK, AMIKACINTROU, AMIKACIN in the last 72 hours.   Microbiology: No results found for this or any previous visit (from the past 720 hour(s)).  Medical History: Past Medical History  Diagnosis Date  . Hypertension   . Heart murmur   . Hyperlipidemia   . Cancer     breast cancer- left    Assessment: 86 y/oF with PMH of HTN, HLD, breast cancer, recent tick bite in May treated with Doxycycline who presented to [facility] ED 6/13 with itching and cellulitis of L breast. Patient was initially placed on Vancomycin and Zosyn, now narrowed to Cefazolin per pharmacy.  6/13  >> Vancomycin >> 6/14 6/13 >> Zosyn >> 6/14 6/14 >> Cefazolin >>  6/14 blood x 2: sent 6/13 RMSF abs: IP 6/13 B. Burgdorfii antibodies: IP  Tmax: 100.82F WBC: elevated, but improving Renal: SCr WNL, CrCl ~ 57 ml/min CG  Goal of Therapy:  Appropriate antibiotic dosing for renal function and  indication Eradication of infection  Plan:   Cefazolin 1g IV q8h  Monitor renal function, cultures, clinical course.   Lindell Spar, PharmD, BCPS Pager: [phone] 03/20/2015 3:56 PM

## 2015-03-20 NOTE — Progress Notes (Signed)
ANTIBIOTIC CONSULT NOTE - INITIAL  Pharmacy Consult for Vancomycin and Zosyn  Indication: Cellulitis  Allergies  Allergen Reactions  . Peanut-Containing Drug Products     All nuts cause fever blisters. *Pecans.      Patient Measurements: Height: 5' (152.4 cm) Weight: 182 lb 6.4 oz (82.736 kg) IBW/kg (Calculated) : 45.5 Adjusted Body Weight:   Vital Signs: Temp: 98.1 F (36.7 C) (06/14 0538) Temp Source: Oral (06/14 0538) BP: 109/57 mmHg (06/14 0538) Pulse Rate: 62 (06/14 0538) Intake/Output from previous day: 06/13 0701 - 06/14 0700 In: 132.5 [I.V.:82.5; IV Piggyback:50] Out: 900 [Urine:900] Intake/Output from this shift: Total I/O In: 132.5 [I.V.:82.5; IV Piggyback:50] Out: 900 [Urine:900]  Labs:  Recent Labs  03/19/15 2156 03/20/15 0506  WBC 16.6* 11.0*  HGB 12.5 11.4*  PLT 145* 141*  CREATININE 0.69 0.74   Estimated Creatinine Clearance: 57 mL/min (by C-G formula based on Cr of 0.74). No results for input(s): VANCOTROUGH, VANCOPEAK, VANCORANDOM, GENTTROUGH, GENTPEAK, GENTRANDOM, TOBRATROUGH, TOBRAPEAK, TOBRARND, AMIKACINPEAK, AMIKACINTROU, AMIKACIN in the last 72 hours.   Microbiology: No results found for this or any previous visit (from the past 720 hour(s)).  Medical History: Past Medical History  Diagnosis Date  . Hypertension   . Heart murmur   . Hyperlipidemia   . Cancer     breast cancer- left    Medications:  Anti-infectives    Start     Dose/Rate Route Frequency Ordered Stop   03/20/15 1000  vancomycin (VANCOCIN) 1,500 mg in sodium chloride 0.9 % 500 mL IVPB     1,500 mg 250 mL/hr over 120 Minutes Intravenous Every 24 hours 03/20/15 0606     03/20/15 0015  piperacillin-tazobactam (ZOSYN) IVPB 3.375 g  Status:  Discontinued     3.375 g 100 mL/hr over 30 Minutes Intravenous  Once 03/20/15 0006 03/20/15 0007   03/20/15 0015  vancomycin (VANCOCIN) IVPB 1000 mg/200 mL premix  Status:  Discontinued     1,000 mg 200 mL/hr over 60 Minutes  Intravenous  Once 03/20/15 0006 03/20/15 0006   03/20/15 0015  piperacillin-tazobactam (ZOSYN) IVPB 3.375 g     3.375 g 12.5 mL/hr over 240 Minutes Intravenous 3 times per day 03/20/15 0007     03/19/15 2245  vancomycin (VANCOCIN) IVPB 1000 mg/200 mL premix  Status:  Discontinued     1,000 mg 200 mL/hr over 60 Minutes Intravenous  Once 03/19/15 2242 03/19/15 2351     Assessment: Patient with breast cellulitis after treatment for tick bite.  First dose of antibiotics already given.  Goal of Therapy:  Vancomycin trough level 10-15 mcg/ml  Plan:  Measure antibiotic drug levels at steady state Follow up culture results Vancomycin 1500mg  iv q24hr  Zosyn 3.375g IV Q8H infused over 4hrs.   Tyler Deis, Shea Stakes Crowford 03/20/2015,6:06 AM

## 2015-03-21 DIAGNOSIS — N61 Inflammatory disorders of breast: Principal | ICD-10-CM

## 2015-03-21 DIAGNOSIS — D72829 Elevated white blood cell count, unspecified: Secondary | ICD-10-CM

## 2015-03-21 DIAGNOSIS — I10 Essential (primary) hypertension: Secondary | ICD-10-CM

## 2015-03-21 DIAGNOSIS — D696 Thrombocytopenia, unspecified: Secondary | ICD-10-CM

## 2015-03-21 LAB — HEMOGLOBIN A1C
HEMOGLOBIN A1C: 6.2 % — AB (ref 4.8–5.6)
Mean Plasma Glucose: 131 mg/dL

## 2015-03-21 LAB — B. BURGDORFI ANTIBODIES: B burgdorferi Ab IgG+IgM: <0.91 {ISR} (ref 0.00–0.90)

## 2015-03-21 LAB — GLUCOSE, CAPILLARY
GLUCOSE-CAPILLARY: 92 mg/dL (ref 65–99)
Glucose-Capillary: 102 mg/dL — ABNORMAL HIGH (ref 65–99)
Glucose-Capillary: 109 mg/dL — ABNORMAL HIGH (ref 65–99)
Glucose-Capillary: 106 mg/dL — ABNORMAL HIGH (ref 65–99)

## 2015-03-21 NOTE — Progress Notes (Addendum)
Patient ID: Briana Smith, female   DOB: 11/22/1940, 74 y.o.   MRN: [id number] [facility] PROGRESS NOTE  Briana Smith [id number]:[id number] DOB: [birth year] DOA: 03/19/2015 PCP: Joycelyn Man, MD  Brief narrative:    74 -year-old female with past medical history of hypertension, breast cancer who presented with worsening redness, warmth and tenderness of the left breast over past few days prior to this admission. She has had recent treatment for tick bite back in May 2016 she was treated with doxycycline. On admission, patient was hemodynamically stable. White blood cell count was 16.6 and platelet count was 145. She was started on broad-spectrum antibodies, vancomycin and Zosyn.  Anticipated discharge: If cellulitis improves anticipate discharge by 03/23/2015  Assessment/Plan:    Principal problem: Left breast cellulitis / leukocytosis - Patient started on empiric vancomycin and Zosyn - White blood cell count is improving - Per patient, cellulitis seems to be better - No open wounds or drainage - Blood cultures to date are negative.  Active problems: Thrombocytopenia - Mild. Platelet count is 141. Probably from patient's history of breast cancer. - No reports of bleeding.  Breast cancer - Continue arimidex daily.  Essential hypertension - Continue HCTZ, lisinopril    DVT Prophylaxis  - Heparin subcutaneous ordered   Code Status: Full.  Family Communication:  plan of care discussed with the patient Disposition Plan: Home likely by 03/23/2015.  IV access:  Peripheral IV  Procedures and diagnostic studies:    No results found.  Medical Consultants:  None   Other Consultants:  None   IAnti-Infectives:   Vanco 03/20/2015 --> Zosyn 03/20/2015 -->   Josey Dettmann, MD  [facility] Pager [phone]  Time spent in minutes: 25 minutes  If 7PM-7AM, please contact night-coverage [email or web address] Password [facility] 03/21/2015, 11:58 AM   LOS: 2 days     HPI/Subjective: No acute overnight events. Patient reports redness on left breast seems to be better.  Objective: Filed Vitals:   03/20/15 1342 03/20/15 2158 03/21/15 0544 03/21/15 0933  BP: 138/66 114/52 101/45 121/59  Pulse: 62 61 57 64  Temp: 98 F (36.7 C) 98.2 F (36.8 C) 98 F (36.7 C)   TempSrc: Oral Oral Oral   Resp: 16 16 16    Height:      Weight:      SpO2: 100% 100% 100%     Intake/Output Summary (Last 24 hours) at 03/21/15 1158 Last data filed at 03/21/15 0932  Gross per 24 hour  Intake 2350.83 ml  Output   3900 ml  Net -1549.17 ml    Exam:   General:  Pt is alert, follows commands appropriately, not in acute distress  Cardiovascular: Regular rate and rhythm, S1/S2, no murmurs  Respiratory: Clear to auscultation bilaterally, no wheezing, no crackles, no rhonchi  Skin: left breast erythema, warmth to palpation  Abdomen: Soft, non tender, non distended, bowel sounds present  Extremities: No edema, pulses DP and PT palpable bilaterally  Neuro: Grossly nonfocal  Data Reviewed: Basic Metabolic Panel:  Recent Labs Lab 03/19/15 2156 03/20/15 0506  NA 136 142  K 3.7 3.8  CL 103 105  CO2 25 27  GLUCOSE 138* 137*  BUN 22* 19  CREATININE 0.69 0.74  CALCIUM 9.3 8.9   Liver Function Tests:  Recent Labs Lab 03/20/15 0506  AST 19  ALT 16  ALKPHOS 35*  BILITOT 0.7  PROT 6.3*  ALBUMIN 3.4*   No results for input(s): LIPASE, AMYLASE in the last 168 hours. No results for  input(s): AMMONIA in the last 168 hours. CBC:  Recent Labs Lab 03/19/15 2156 03/20/15 0506  WBC 16.6* 11.0*  NEUTROABS 15.1*  --   HGB 12.5 11.4*  HCT 38.9 34.2*  MCV 93.1 93.2  PLT 145* 141*   Cardiac Enzymes: No results for input(s): CKTOTAL, CKMB, CKMBINDEX, TROPONINI in the last 168 hours. BNP: Invalid input(s): POCBNP CBG:  Recent Labs Lab 03/20/15 0734 03/20/15 1259 03/20/15 1704 03/21/15 0744 03/21/15 1136  GLUCAP 116* 105* 107* 102* 109*     Recent Results (from the past 240 hour(s))  Culture, blood (routine x 2)     Status: None (Preliminary result)   Collection Time: 03/20/15 12:25 AM  Result Value Ref Range Status   Specimen Description BLOOD RIGHT WRIST  Final   Special Requests BOTTLES DRAWN AEROBIC AND ANAEROBIC  Final   Culture   Final           BLOOD CULTURE RECEIVED NO GROWTH TO DATE CULTURE WILL BE HELD FOR 5 DAYS BEFORE ISSUING A FINAL NEGATIVE REPORT Performed at [facility]    Report Status PENDING  Incomplete  Culture, blood (routine x 2)     Status: None (Preliminary result)   Collection Time: 03/20/15 12:25 AM  Result Value Ref Range Status   Specimen Description BLOOD RIGHT FOREARM  Final   Special Requests BOTTLES DRAWN AEROBIC ONLY  Final   Culture   Final           BLOOD CULTURE RECEIVED NO GROWTH TO DATE CULTURE WILL BE HELD FOR 5 DAYS BEFORE ISSUING A FINAL NEGATIVE REPORT Performed at [facility]    Report Status PENDING  Incomplete     Scheduled Meds: . anastrozole  1 mg Oral Daily  . aspirin EC  81 mg Oral Daily  .  ceFAZolin (ANCEF) IV  1 g Intravenous 3 times per day  . folic acid  1 mg Oral Daily  . heparin  5,000 Units Subcutaneous 3 times per day  . hydrochlorothiazide  12.5 mg Oral Daily  . insulin aspart  0-15 Units Subcutaneous TID WC  . lisinopril  20 mg Oral Daily  . meloxicam  7.5 mg Oral Daily  . multivitamin with minerals  1 tablet Oral Daily  . potassium chloride  10 mEq Oral Daily  . pyridoxine  100 mg Oral Daily  . thiamine  100 mg Oral Daily  . timolol  1 drop Both Eyes BID   Continuous Infusions: . sodium chloride 1,000 mL (03/21/15 0140)

## 2015-03-21 NOTE — Plan of Care (Signed)
Problem: Phase II Progression Outcomes Goal: Wound without signs/symptoms of infection, decreasing edema Outcome: Not Progressing Redness and edema at L breast continuing; MD changed abx from Zosyn to Ancef.

## 2015-03-22 LAB — ROCKY MTN SPOTTED FVR ABS PNL(IGG+IGM)
RMSF IgG: NEGATIVE
RMSF IgM: 0.19 index (ref 0.00–0.89)

## 2015-03-22 LAB — CBC
HCT: 35.9 % — ABNORMAL LOW (ref 36.0–46.0)
HEMOGLOBIN: 11.9 g/dL — AB (ref 12.0–15.0)
MCH: 30.8 pg (ref 26.0–34.0)
MCHC: 33.1 g/dL (ref 30.0–36.0)
MCV: 93 fL (ref 78.0–100.0)
PLATELETS: 143 10*3/uL — AB (ref 150–400)
RBC: 3.86 MIL/uL — ABNORMAL LOW (ref 3.87–5.11)
RDW: 13.6 % (ref 11.5–15.5)
WBC: 4.7 10*3/uL (ref 4.0–10.5)

## 2015-03-22 LAB — GLUCOSE, CAPILLARY: Glucose-Capillary: 94 mg/dL (ref 65–99)

## 2015-03-22 MED ORDER — SULFAMETHOXAZOLE-TRIMETHOPRIM 800-160 MG PO TABS: 1.0000 | ORAL_TABLET | Freq: Two times a day (BID) | ORAL | Status: DC

## 2015-03-22 NOTE — Discharge Summary (Signed)
Physician Discharge Summary  Briana Smith EBR:[id number] DOB: [birth year] DOA: 03/19/2015  PCP: Joycelyn Man, MD  Admit date: 03/19/2015 Discharge date: 03/22/2015  Recommendations for Outpatient Follow-up:  1. Continue bactrim for 6 days on discharge for treatment of cellulitis  Discharge Diagnoses:  Active Problems:   Essential hypertension   Osteopenia   Cellulitis of female breast   Hyperglycemia   Cellulitis    Discharge Condition: stable   Diet recommendation: as tolerated   History of present illness:  74 -year-old female with past medical history of hypertension, breast cancer who presented with worsening redness, warmth and tenderness of the left breast over past few days prior to this admission. She has had recent treatment for tick bite back in May 2016 and she was treated with doxycycline.  On admission, patient was hemodynamically stable. White blood cell count was 16.6 and platelet count was 145. She was started on broad-spectrum antibodies, vancomycin and Zosyn.  Hospital Course:   Assessment/Plan:    Principal problem: Left breast cellulitis / leukocytosis - Patient with left breast cellulitis  improving with vancomycin and Zosyn - We will continue bactrim for 6 more days on discharge, this will complete total of 10 days on abx treatment for cellulitis  - Blood cultures so far are negative.  Active problems: Thrombocytopenia - Likely secondary to history of  malignancy. - Hgb 11.9 - No reports of bleeding   Breast cancer - Continue Arimidex on discharge   Essential hypertension - Continue BP per home regimen lisinopril/hctz    DVT Prophylaxis  - Heparin subcutaneous ordered in hospital    Code Status: Full.  Family Communication: plan of care discussed with the patient   IV access:  Peripheral IV  Procedures and diagnostic studies:   No results found.  Medical Consultants:  None   Other Consultants:  None    IAnti-Infectives:   Vanco 03/20/2015 --> 03/23/15 Zosyn 03/20/2015 --> 03/23/2015   Signed:  Leisa Lenz, MD  [facility] 03/22/2015, 10:02 PM  Pager #: [phone]  Time spent in minutes: more than 30 minutes   Discharge Exam: Filed Vitals:   03/22/15 2125  BP: 120/61  Pulse: 69  Temp: 98.9 F (37.2 C)  Resp: 18   Filed Vitals:   03/22/15 0534 03/22/15 1130 03/22/15 1351 03/22/15 2125  BP: 118/60 135/67 130/58 120/61  Pulse: 62 68 61 69  Temp: 98 F (36.7 C)  98.8 F (37.1 C) 98.9 F (37.2 C)  TempSrc: Oral  Oral Oral  Resp: 18  18 18   Height:      Weight:      SpO2: 99%  100% 100%    General: Pt is alert, follows commands appropriately, not in acute distress Cardiovascular: Regular rate and rhythm, S1/S2 +, no murmurs Respiratory: Clear to auscultation bilaterally, no wheezing, no crackles, no rhonchi Skin: breast cellulitis on the left side better  Abdominal: Soft, non tender, non distended, bowel sounds +, no guarding Extremities: no edema, no cyanosis, pulses palpable bilaterally DP and PT Neuro: Grossly nonfocal  Discharge Instructions  Discharge Instructions    Discharge instructions    Complete by:  As directed   1. Continue bactrim for 6 days on discharge for treatment of cellulitis            Medication List    STOP taking these medications        doxycycline 100 MG capsule  Commonly known as:  VIBRAMYCIN      TAKE these medications  anastrozole 1 MG tablet  Commonly known as:  ARIMIDEX  Take 1 tablet (1 mg total) by mouth daily.     aspirin 81 MG tablet  Take 81 mg by mouth daily.     CALCIUM PO  Take by mouth.     D-3-5 PO  Take by mouth.     lisinopril-hydrochlorothiazide 20-12.5 MG per tablet  Commonly known as:  PRINZIDE,ZESTORETIC  TAKE ONE-HALF TABLET BY MOUTH IN THE MORNING     MAGNESIUM PO  Take by mouth daily.     MOBIC PO  Take by mouth.     multivitamin tablet  Take 1 tablet by mouth  daily.     potassium chloride 10 MEQ tablet  Commonly known as:  K-DUR  Take 10 mEq by mouth daily.     pyridoxine 100 MG tablet  Commonly known as:  B-6  Take 100 mg by mouth daily.     sulfamethoxazole-trimethoprim 800-160 MG per tablet  Commonly known as:  BACTRIM DS,SEPTRA DS  Take 1 tablet by mouth 2 (two) times daily.     timolol 0.5 % ophthalmic solution  Commonly known as:  TIMOPTIC  BID times 48H.           Follow-up Information    Follow up with TODD,JEFFREY ALLEN, MD. Schedule an appointment as soon as possible for a visit in 1 week.   Specialty:  Family Medicine   Why:  Follow up appt after recent hospitalization   Contact information:   [place] [phone]        The results of significant diagnostics from this hospitalization (including imaging, microbiology, ancillary and laboratory) are listed below for reference.    Significant Diagnostic Studies: No results found.  Microbiology: Recent Results (from the past 240 hour(s))  Culture, blood (routine x 2)     Status: None (Preliminary result)   Collection Time: 03/20/15 12:25 AM  Result Value Ref Range Status   Specimen Description BLOOD RIGHT WRIST  Final   Special Requests BOTTLES DRAWN AEROBIC AND ANAEROBIC  Final   Culture   Final           BLOOD CULTURE RECEIVED NO GROWTH TO DATE CULTURE WILL BE HELD FOR 5 DAYS BEFORE ISSUING A FINAL NEGATIVE REPORT Performed at [facility]    Report Status PENDING  Incomplete  Culture, blood (routine x 2)     Status: None (Preliminary result)   Collection Time: 03/20/15 12:25 AM  Result Value Ref Range Status   Specimen Description BLOOD RIGHT FOREARM  Final   Special Requests BOTTLES DRAWN AEROBIC ONLY  Final   Culture   Final           BLOOD CULTURE RECEIVED NO GROWTH TO DATE CULTURE WILL BE HELD FOR 5 DAYS BEFORE ISSUING A FINAL NEGATIVE REPORT Performed at [facility]    Report Status PENDING   Incomplete     Labs: Basic Metabolic Panel:  Recent Labs Lab 03/19/15 2156 03/20/15 0506  NA 136 142  K 3.7 3.8  CL 103 105  CO2 25 27  GLUCOSE 138* 137*  BUN 22* 19  CREATININE 0.69 0.74  CALCIUM 9.3 8.9   Liver Function Tests:  Recent Labs Lab 03/20/15 0506  AST 19  ALT 16  ALKPHOS 35*  BILITOT 0.7  PROT 6.3*  ALBUMIN 3.4*   No results for input(s): LIPASE, AMYLASE in the last 168 hours. No results for input(s): AMMONIA in the  last 168 hours. CBC:  Recent Labs Lab 03/19/15 2156 03/20/15 0506 03/22/15 1050  WBC 16.6* 11.0* 4.7  NEUTROABS 15.1*  --   --   HGB 12.5 11.4* 11.9*  HCT 38.9 34.2* 35.9*  MCV 93.1 93.2 93.0  PLT 145* 141* 143*   Cardiac Enzymes: No results for input(s): CKTOTAL, CKMB, CKMBINDEX, TROPONINI in the last 168 hours. BNP: BNP (last 3 results) No results for input(s): BNP in the last 8760 hours.  ProBNP (last 3 results) No results for input(s): PROBNP in the last 8760 hours.  CBG:  Recent Labs Lab 03/21/15 0744 03/21/15 1136 03/21/15 1719 03/21/15 2217 03/22/15 0754  GLUCAP 102* 109* 106* 92 94

## 2015-03-22 NOTE — Discharge Instructions (Signed)

## 2015-03-22 NOTE — Care Management Note (Signed)
Case Management Note  Patient Details  Name: Briana Smith MRN: [id number] Date of Birth: [birth year]  Subjective/Objective:        Admitted with cellulitis of the breast            Action/Plan: Discharge planning, spoke with patient at bedside, feels much better, no d/c needs identified.   Expected Discharge Date:                  Expected Discharge Plan:  Home/Self Care  In-House Referral:  NA  Discharge planning Services  CM Consult  Post Acute Care Choice:    Choice offered to:     DME Arranged:    DME Agency:     HH Arranged:    HH Agency:     Status of Service:  Completed, signed off  Medicare Important Message Given:  Yes Date Medicare IM Given:  03/22/15 Medicare IM give by:  Sunday Spillers RN CM  Date Additional Medicare IM Given:    Additional Medicare Important Message give by:     If discussed at [place] of Stay Meetings, dates discussed:    Additional Comments:  Guadalupe Maple, RN 03/22/2015, 11:18 AM

## 2015-03-22 NOTE — Progress Notes (Signed)
Patient ID: Briana Smith, female   DOB: 12/15/1940, 74 y.o.   MRN: [id number] [facility] PROGRESS NOTE  Briana Smith KGU:[id number] DOB: 04-09-1941 DOA: 03/19/2015 PCP: Joycelyn Man, MD  Brief narrative:    74 -year-old female with past medical history of hypertension, breast cancer who presented with worsening redness, warmth and tenderness of the left breast over past few days prior to this admission. She has had recent treatment for tick bite back in May 2016 and she was treated with doxycycline. On admission, patient was hemodynamically stable. White blood cell count was 16.6 and platelet count was 145. She was started on broad-spectrum antibodies, vancomycin and Zosyn.  Anticipated discharge: Anticipated discharge 03/23/2015. Patient will benefit from being on IV antibodies for additional 24 hours.  Assessment/Plan:    Principal problem: Left breast cellulitis / leukocytosis - Patient with left breast cellulitis which seems to be improving with vancomycin and Zosyn. Continue this regimen for next 24 hours. - Plan to discharge patient tomorrow with Bactrim for additional 6 days which would complete treatment for cellulitis, total of 10 days. - Check CBC this morning. - Blood cultures so far are negative.  Active problems: Thrombocytopenia - Likely because of history of malignancy. - No reports of bleeding. - Check CBC this morning.  Breast cancer - Continue arimidex.  Essential hypertension - Continue HCTZ, lisinopril - Renal function is within normal limits - Blood pressure is 118/60    DVT Prophylaxis  - Heparin subcutaneous ordered while patient is in hospital   Code Status: Full.  Family Communication:  plan of care discussed with the patient Disposition Plan: Home 03/23/2015  IV access:  Peripheral IV  Procedures and diagnostic studies:    No results found.  Medical Consultants:  None   Other Consultants:  None   IAnti-Infectives:    Vanco 03/20/2015 --> Zosyn 03/20/2015 -->   Junnie Loschiavo, MD  [facility] Pager [phone]  Time spent in minutes: 15 minutes  If 7PM-7AM, please contact night-coverage [email or web address] Password [facility] 03/22/2015, 9:17 AM   LOS: 3 days    HPI/Subjective: No acute overnight events. Redness over left breast area looks better since yesterday. No pain.  Objective: Filed Vitals:   03/21/15 0933 03/21/15 1440 03/21/15 2200 03/22/15 0534  BP: 121/59 152/70 143/74 118/60  Pulse: 64  63 62  Temp:  98.1 F (36.7 C) 98 F (36.7 C) 98 F (36.7 C)  TempSrc:  Oral Oral Oral  Resp:   18 18  Height:      Weight:      SpO2:  100% 99% 99%    Intake/Output Summary (Last 24 hours) at 03/22/15 7616 Last data filed at 03/22/15 0600  Gross per 24 hour  Intake 2111.67 ml  Output   2000 ml  Net 111.67 ml    Exam:   General:  Pt is alert, no acute distress  Cardiovascular: Regular rhythm, appreciated S1-S2  Respiratory: Bilateral air entry, no wheezing   Skin: redness of her left breast area improving, no swelling, no open wounds   Abdomen: nontender, nondistended, appreciated bowel sounds   Extremities: no leg swelling, pulses palpable   Neuro: no focal neurological deficits.   Data Reviewed: Basic Metabolic Panel:  Recent Labs Lab 03/19/15 2156 03/20/15 0506  NA 136 142  K 3.7 3.8  CL 103 105  CO2 25 27  GLUCOSE 138* 137*  BUN 22* 19  CREATININE 0.69 0.74  CALCIUM 9.3 8.9   Liver Function Tests:  Recent  Labs Lab 03/20/15 0506  AST 19  ALT 16  ALKPHOS 35*  BILITOT 0.7  PROT 6.3*  ALBUMIN 3.4*   No results for input(s): LIPASE, AMYLASE in the last 168 hours. No results for input(s): AMMONIA in the last 168 hours. CBC:  Recent Labs Lab 03/19/15 2156 03/20/15 0506  WBC 16.6* 11.0*  NEUTROABS 15.1*  --   HGB 12.5 11.4*  HCT 38.9 34.2*  MCV 93.1 93.2  PLT 145* 141*   Cardiac Enzymes: No results for input(s): CKTOTAL, CKMB, CKMBINDEX,  TROPONINI in the last 168 hours. BNP: Invalid input(s): POCBNP CBG:  Recent Labs Lab 03/21/15 0744 03/21/15 1136 03/21/15 1719 03/21/15 2217 03/22/15 0754  GLUCAP 102* 109* 106* 92 94    Recent Results (from the past 240 hour(s))  Culture, blood (routine x 2)     Status: None (Preliminary result)   Collection Time: 03/20/15 12:25 AM  Result Value Ref Range Status   Specimen Description BLOOD RIGHT WRIST  Final   Special Requests BOTTLES DRAWN AEROBIC AND ANAEROBIC  Final   Culture   Final           BLOOD CULTURE RECEIVED NO GROWTH TO DATE CULTURE WILL BE HELD FOR 5 DAYS BEFORE ISSUING A FINAL NEGATIVE REPORT Performed at [facility]    Report Status PENDING  Incomplete  Culture, blood (routine x 2)     Status: None (Preliminary result)   Collection Time: 03/20/15 12:25 AM  Result Value Ref Range Status   Specimen Description BLOOD RIGHT FOREARM  Final   Special Requests BOTTLES DRAWN AEROBIC ONLY  Final   Culture   Final           BLOOD CULTURE RECEIVED NO GROWTH TO DATE CULTURE WILL BE HELD FOR 5 DAYS BEFORE ISSUING A FINAL NEGATIVE REPORT Performed at [facility]    Report Status PENDING  Incomplete     Scheduled Meds: . anastrozole  1 mg Oral Daily  . aspirin EC  81 mg Oral Daily  .  ceFAZolin (ANCEF) IV  1 g Intravenous 3 times per day  . heparin  5,000 Units Subcutaneous 3 times per day  . hydrochlorothiazide  12.5 mg Oral Daily  . insulin aspart  0-15 Units Subcutaneous TID WC  . lisinopril  20 mg Oral Daily  . meloxicam  7.5 mg Oral Daily  . potassium chloride  10 mEq Oral Daily  . pyridoxine  100 mg Oral Daily  . timolol  1 drop Both Eyes BID   Continuous Infusions: . sodium chloride 50 mL/hr at 03/21/15 2133

## 2015-03-23 ENCOUNTER — Telehealth: Payer: Self-pay | Admitting: *Deleted

## 2015-03-23 DIAGNOSIS — C50919 Malignant neoplasm of unspecified site of unspecified female breast: Secondary | ICD-10-CM

## 2015-03-23 NOTE — Telephone Encounter (Signed)
NOTIFIED PT. THAT DR.MAGRINAT WOULD NOT BE ABLE TO SEE HER. SHE WILL CALL DR.TODD'S OFFICE FOR A FOLLOW UP APPOINTMENT AFTER HER HOSPITALIZATION.

## 2015-03-23 NOTE — Telephone Encounter (Signed)
PT. HAD CELLULITIS OF THE LEFT BREAST. [place] FROM 03/19/15 TO 03/22/15. PT. WAS TOLD TO FOLLOW UP WITH DR.TODD AFTER SHE FINISHES SIX DAYS OF BACTRIM DS. DR.TODD IS ON LEAVE AND WILL BE RETURNING PART TIME IN A FEW MONTHS. PT. HAS SEEN A PHYSICIAN'S ASSISTANT IN [place] FOR A TICE BITE BUT HAS NOT BEEN ESTABLISHED WITH A NEW PRIMARY CARE PHYSICIAN. WOULD DR.MAGRINAT BE ABLE TO SEE PT 03/30/15 OR FIRST OF THE NEXT WEEK AS A FOLLOW UP AFTER HER HOSPITALIZATION?

## 2015-03-25 ENCOUNTER — Other Ambulatory Visit: Payer: Self-pay | Admitting: Oncology

## 2015-03-26 LAB — CULTURE, BLOOD (ROUTINE X 2)
CULTURE: NO GROWTH
Culture: NO GROWTH

## 2015-03-30 ENCOUNTER — Ambulatory Visit (INDEPENDENT_AMBULATORY_CARE_PROVIDER_SITE_OTHER): Payer: Medicare HMO | Admitting: Internal Medicine

## 2015-03-30 ENCOUNTER — Encounter: Payer: Self-pay | Admitting: Internal Medicine

## 2015-03-30 VITALS — BP 120/68 | HR 64 | Temp 98.4°F | Resp 20 | Ht 60.0 in | Wt 180.0 lb

## 2015-03-30 DIAGNOSIS — I10 Essential (primary) hypertension: Secondary | ICD-10-CM

## 2015-03-30 DIAGNOSIS — C50512 Malignant neoplasm of lower-outer quadrant of left female breast: Secondary | ICD-10-CM

## 2015-03-30 DIAGNOSIS — R7302 Impaired glucose tolerance (oral): Secondary | ICD-10-CM

## 2015-03-30 DIAGNOSIS — N61 Inflammatory disorders of breast: Secondary | ICD-10-CM | POA: Diagnosis not present

## 2015-03-30 NOTE — Patient Instructions (Signed)
Report any new or worsening symptoms  Follow-up with oncology as scheduled  Return in 6 months for follow-up

## 2015-03-30 NOTE — Progress Notes (Signed)
Pre visit review using our clinic review tool, if applicable. No additional management support is needed unless otherwise documented below in the visit note. 

## 2015-03-30 NOTE — Progress Notes (Signed)
Subjective:    Patient ID: Briana Smith, female    DOB: [birth year], 74 y.o.   MRN: [id number]  HPI PCP: Joycelyn Man, MD  Admit date: 03/19/2015 Discharge date: 03/22/2015  Recommendations for Outpatient Follow-up:  1. Continue bactrim for 6 days on discharge for treatment of cellulitis  Discharge Diagnoses:  Active Problems:  Essential hypertension  Osteopenia  Cellulitis of female breast  Hyperglycemia  Cellulitis  74 year old patient who has a prior history of left breast cancer.  She was hospitalized recently and discharged 8 days ago for cellulitis involving the left breast.  She completed oral antibiotics yesterday and has done quite well. Hospital course noted for impaired glucose tolerance.  The pain and redness of the left breast has resolved No fever or constitutional complaints No diarrhea, rash or issues with antibiotic therapy  She has essential hypertension which has been stable  Past Medical History  Diagnosis Date  . Hypertension   . Heart murmur   . Hyperlipidemia   . Cancer     breast cancer- left    History   Social History  . Marital Status: Married    Spouse Name: N/A  . Number of Children: N/A  . Years of Education: N/A   Occupational History  . Not on file.   Social History Main Topics  . Smoking status: Never Smoker   . Smokeless tobacco: Never Used  . Alcohol Use: No  . Drug Use: No  . Sexual Activity: Not on file   Other Topics Concern  . Not on file   Social History Narrative    Past Surgical History  Procedure Laterality Date  . Abdominal hysterectomy  1987  . Breast lumpectomy  10/23/10    left  . Port-a-cath removal  09/02/2011    Procedure: REMOVAL PORT-A-CATH;  Surgeon: Adin Hector, MD;  Location: [place];  Service: General;  Laterality: Right;    Family History  Problem Relation Age of Onset  . COPD Father   . Cancer Brother     jaw    Allergies  Allergen Reactions  .  Peanut-Containing Drug Products     All nuts cause fever blisters. *Pecans.      Current Outpatient Prescriptions on File Prior to Visit  Medication Sig Dispense Refill  . anastrozole (ARIMIDEX) 1 MG tablet Take 1 tablet (1 mg total) by mouth daily. 30 tablet 5  . aspirin 81 MG tablet Take 81 mg by mouth daily.    Marland Kitchen CALCIUM PO Take by mouth.      . Cholecalciferol (D-3-5 PO) Take by mouth.      Marland Kitchen lisinopril-hydrochlorothiazide (PRINZIDE,ZESTORETIC) 20-12.5 MG per tablet TAKE ONE-HALF TABLET BY MOUTH IN THE MORNING 50 tablet 3  . MAGNESIUM PO Take by mouth daily.      . Meloxicam (MOBIC PO) Take by mouth.    . Multiple Vitamin (MULTIVITAMIN) tablet Take 1 tablet by mouth daily.      . potassium chloride (K-DUR) 10 MEQ tablet Take 10 mEq by mouth daily.    Marland Kitchen pyridoxine (B-6) 100 MG tablet Take 100 mg by mouth daily.    . timolol (TIMOPTIC) 0.5 % ophthalmic solution BID times 48H.     No current facility-administered medications on file prior to visit.    BP 120/68 mmHg  Pulse 64  Temp(Src) 98.4 F (36.9 C) (Oral)  Resp 20  Ht 5' (1.524 m)  Wt 180 lb (81.647 kg)  BMI 35.15 kg/m2  SpO2 98%  Review of Systems  Constitutional: Negative.   HENT: Negative for congestion, dental problem, hearing loss, rhinorrhea, sinus pressure, sore throat and tinnitus.   Eyes: Negative for pain, discharge and visual disturbance.  Respiratory: Negative for cough and shortness of breath.   Cardiovascular: Negative for chest pain, palpitations and leg swelling.  Gastrointestinal: Negative for nausea, vomiting, abdominal pain, diarrhea, constipation, blood in stool and abdominal distention.  Genitourinary: Negative for dysuria, urgency, frequency, hematuria, flank pain, vaginal bleeding, vaginal discharge, difficulty urinating, vaginal pain and pelvic pain.  Musculoskeletal: Negative for joint swelling, arthralgias and gait problem.  Skin: Negative for rash.  Neurological: Negative for  dizziness, syncope, speech difficulty, weakness, numbness and headaches.  Hematological: Negative for adenopathy.  Psychiatric/Behavioral: Negative for behavioral problems, dysphoric mood and agitation. The patient is not nervous/anxious.        Objective:   Physical Exam  Constitutional: She is oriented to person, place, and time. She appears well-developed and well-nourished.  HENT:  Head: Normocephalic.  Right Ear: External ear normal.  Left Ear: External ear normal.  Mouth/Throat: Oropharynx is clear and moist.  Eyes: Conjunctivae and EOM are normal. Pupils are equal, round, and reactive to light.  Neck: Normal range of motion. Neck supple. No thyromegaly present.  Cardiovascular: Normal rate, regular rhythm, normal heart sounds and intact distal pulses.   Pulmonary/Chest: Effort normal and breath sounds normal.  Left breast, without inflammatory changes.  No tenderness or erythema.  Axilla clear  Abdominal: Soft. Bowel sounds are normal. She exhibits no mass. There is no tenderness.  Musculoskeletal: Normal range of motion.  Lymphadenopathy:    She has no cervical adenopathy.  Neurological: She is alert and oriented to person, place, and time.  Skin: Skin is warm and dry. No rash noted.  Psychiatric: She has a normal mood and affect. Her behavior is normal.          Assessment & Plan:   Status post cellulitis left breast.  Clinically resolved History of breast cancer.  Follow-up oncology Hypertension, well-controlled Impaired glucose tolerance

## 2015-07-13 DIAGNOSIS — G5602 Carpal tunnel syndrome, left upper limb: Secondary | ICD-10-CM | POA: Diagnosis not present

## 2015-07-18 ENCOUNTER — Other Ambulatory Visit (HOSPITAL_BASED_OUTPATIENT_CLINIC_OR_DEPARTMENT_OTHER): Payer: Medicare HMO

## 2015-07-18 DIAGNOSIS — C50512 Malignant neoplasm of lower-outer quadrant of left female breast: Secondary | ICD-10-CM | POA: Diagnosis not present

## 2015-07-18 DIAGNOSIS — I10 Essential (primary) hypertension: Secondary | ICD-10-CM

## 2015-07-18 DIAGNOSIS — M8580 Other specified disorders of bone density and structure, unspecified site: Secondary | ICD-10-CM

## 2015-07-18 LAB — CBC WITH DIFFERENTIAL/PLATELET
BASO%: 0.9 % (ref 0.0–2.0)
Basophils Absolute: 0.1 10*3/uL (ref 0.0–0.1)
EOS%: 2.5 % (ref 0.0–7.0)
Eosinophils Absolute: 0.2 10*3/uL (ref 0.0–0.5)
HCT: 39 % (ref 34.8–46.6)
HGB: 12.7 g/dL (ref 11.6–15.9)
LYMPH%: 9.9 % — ABNORMAL LOW (ref 14.0–49.7)
MCH: 30 pg (ref 25.1–34.0)
MCHC: 32.6 g/dL (ref 31.5–36.0)
MCV: 92 fL (ref 79.5–101.0)
MONO#: 0.6 10*3/uL (ref 0.1–0.9)
MONO%: 8.7 % (ref 0.0–14.0)
NEUT#: 5 10*3/uL (ref 1.5–6.5)
NEUT%: 78 % — AB (ref 38.4–76.8)
PLATELETS: 188 10*3/uL (ref 145–400)
RBC: 4.24 10*6/uL (ref 3.70–5.45)
RDW: 13 % (ref 11.2–14.5)
WBC: 6.4 10*3/uL (ref 3.9–10.3)
lymph#: 0.6 10*3/uL — ABNORMAL LOW (ref 0.9–3.3)

## 2015-07-18 LAB — COMPREHENSIVE METABOLIC PANEL (CC13)
ALT: 19 U/L (ref 0–55)
ANION GAP: 8 meq/L (ref 3–11)
AST: 18 U/L (ref 5–34)
Albumin: 3.7 g/dL (ref 3.5–5.0)
Alkaline Phosphatase: 65 U/L (ref 40–150)
BUN: 19.8 mg/dL (ref 7.0–26.0)
CALCIUM: 9.5 mg/dL (ref 8.4–10.4)
CHLORIDE: 104 meq/L (ref 98–109)
CO2: 28 mEq/L (ref 22–29)
Creatinine: 0.9 mg/dL (ref 0.6–1.1)
EGFR: 66 mL/min/{1.73_m2} — ABNORMAL LOW (ref >90)
Glucose: 74 mg/dl (ref 70–140)
POTASSIUM: 3.9 meq/L (ref 3.5–5.1)
Sodium: 140 mEq/L (ref 136–145)
Total Bilirubin: <0.3 mg/dL (ref 0.20–1.20)
Total Protein: 7.5 g/dL (ref 6.4–8.3)

## 2015-07-24 DIAGNOSIS — G5602 Carpal tunnel syndrome, left upper limb: Secondary | ICD-10-CM | POA: Diagnosis not present

## 2015-07-25 ENCOUNTER — Other Ambulatory Visit: Payer: Self-pay | Admitting: *Deleted

## 2015-07-25 ENCOUNTER — Ambulatory Visit (HOSPITAL_BASED_OUTPATIENT_CLINIC_OR_DEPARTMENT_OTHER): Payer: Medicare HMO | Admitting: Nurse Practitioner

## 2015-07-25 ENCOUNTER — Ambulatory Visit (HOSPITAL_BASED_OUTPATIENT_CLINIC_OR_DEPARTMENT_OTHER): Payer: Medicare HMO

## 2015-07-25 ENCOUNTER — Telehealth: Payer: Self-pay | Admitting: Nurse Practitioner

## 2015-07-25 ENCOUNTER — Encounter: Payer: Self-pay | Admitting: Nurse Practitioner

## 2015-07-25 VITALS — BP 158/66 | HR 74 | Temp 98.5°F | Resp 18 | Ht 60.0 in | Wt 182.8 lb

## 2015-07-25 DIAGNOSIS — E2839 Other primary ovarian failure: Secondary | ICD-10-CM

## 2015-07-25 DIAGNOSIS — Z170 Estrogen receptor positive status [ER+]: Secondary | ICD-10-CM

## 2015-07-25 DIAGNOSIS — M8580 Other specified disorders of bone density and structure, unspecified site: Secondary | ICD-10-CM

## 2015-07-25 DIAGNOSIS — C50512 Malignant neoplasm of lower-outer quadrant of left female breast: Secondary | ICD-10-CM

## 2015-07-25 DIAGNOSIS — Z85828 Personal history of other malignant neoplasm of skin: Secondary | ICD-10-CM

## 2015-07-25 DIAGNOSIS — Z79810 Long term (current) use of selective estrogen receptor modulators (SERMs): Secondary | ICD-10-CM | POA: Diagnosis not present

## 2015-07-25 DIAGNOSIS — I10 Essential (primary) hypertension: Secondary | ICD-10-CM

## 2015-07-25 MED ORDER — OMEPRAZOLE 40 MG PO CPDR: 40.0000 mg | DELAYED_RELEASE_CAPSULE | Freq: Every evening | ORAL | Status: DC

## 2015-07-25 MED ORDER — ANASTROZOLE 1 MG PO TABS: 1.0000 mg | ORAL_TABLET | Freq: Every day | ORAL | Status: DC

## 2015-07-25 MED ORDER — ZOLEDRONIC ACID 4 MG/100ML IV SOLN
4.0000 mg | Freq: Once | INTRAVENOUS | Status: AC
  Administered 2015-07-25: 4 mg via INTRAVENOUS
  Filled 2015-07-25: qty 100

## 2015-07-25 MED ORDER — SODIUM CHLORIDE 0.9 % IV SOLN
Freq: Once | INTRAVENOUS | Status: AC
  Administered 2015-07-25: 13:00:00 via INTRAVENOUS

## 2015-07-25 NOTE — Telephone Encounter (Signed)
Due to an extended hold time i am faxing her mammo and dexa order to the breast center for them to call the [place] appointments have been made and avs printed for patient

## 2015-07-25 NOTE — Patient Instructions (Signed)

## 2015-07-25 NOTE — Progress Notes (Signed)
Hematology and Oncology Follow Up Visit  Briana Smith [id number] [birth year] 74 y.o. 07/25/2015   PCP: Joycelyn Man, MD GYN: SU:  OTHER MD: Carmon Sails, [name], Ethelene Hal  CHIEF COMPLAINT: Estrogen receptor positive breast cancer  CURRENT TREATMENT: Anastrozole  HPI: From the original intake note:  Briana Smith had screening mammography August 14, 2010 at the [facility] showing a possible mass in the left breast.  She was brought back for additional views November 22 and Dr. Sadie Haber was able to confirm an ill defined mass in the inferior portion of the left breast which was not palpable.  Ultrasound showed this to be irregularly marginated and to measure approximately 1 cm.  The left axilla was unremarkable.  Biopsy was performed the same day and showed ([id number]) an intermediate grade invasive ductal carcinoma which was estrogen receptor positive at 100%, progesterone receptor positive at 99% with an elevated MIB-1 at 44%, but no HER2/neu amplification by CISH with a ratio of 1.62.  Bilateral breast MRIs were obtained on September 04, 2010.  This confirmed a 1.7 cm irregularly marginated mass in the left breast consistent with the previously diagnosed cancer.  They could also see the clip from her prior benign left breast biopsy obtained in 2000.   However, to complicate things there was an area in the left and upper manubrium with abnormal signal intensity.  It was felt that a further evaluation would be helpful and the patient had a bone scan on December 7.  This showed a strong focus of uptake in the central upper sternum and a second one at the sternal manubrial junction slightly to the left.  There was also a symmetric increased uptake in the right proximal humerus and a smaller area of uptake again rather faint in the left proximal femur.  This was felt to be most consistent with metastatic disease.  The patient then saw Dr. Humphrey Rolls in our office and she discussed her  disease in terms of stage IV breast cancer.   Dr. Humphrey Rolls set the patient up for a PET scan which was performed on December 12 with associated CT scan.  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  There was some fluid within the joints as well on MRI review.  Similarly, with the lesion in the sternomanubrial joints, there was an SUV maximum of 3.1 and significant sclerosis more consistent with arthropathy.  The increased uptake in the right proximal humerus could not be reproduced by CT or PET.  The breast primary had an SUV of 1.8 and measured 1.0 cm on these images.  There was no adenopathy in the axilla or chest and the rest of the CT/PET was negative except for an 8 mm right thyroid nodule which will require a work up at some point seen only the CT scan, not on the PET scan.  There was a nonhypermetabolic right hepatic lobe lesion felt to be most consistent with a complex cyst or hemangioma.  There was also hemangioma in the L3 vertebral body and in other, thoracic vertebrae.  Finally, there was an exophytic upper pole left renal lesion felt to be most likely a cyst.  The patient then sought a second opinion under Dr. Gypsy Balsam at [facility] and she was seen there October 14, 2010.  Dr. Philipp Ovens repeated the bone scan and PET scan and proceeded to obtain plain  films of the left hip and proximal humerus. Those studies confirmed that the patient essentially she had some arthritis but no evidence of metastatic disease. The patient then proceeded to left partial mastectomy with sentinel lymph node sampling October 23, 2010.  The results from that procedure ([id number]) showed a 1.2 cm grade 2 invasive ductal carcinoma with 1 of 2 sentinel lymph nodes involved.  Margins were negative.  There was evidence of lymphovascular invasion.  HER2/neu CISH was repeated and was again negative with the ratio of  1.58.  Briana Smith received adjuvant chemotherapy consisting of 4 cycles of docetaxel and cyclophosphamide. Chemotherapy was completed in May of 2012 and was followed by radiation therapy. Radiation was completed in mid August 2012, at which time patient began on tamoxifen, 20 mg daily.   Her subsequent history is as detailed below.  Interim History:   Briana Smith returns today for follow up of her breast cancer. She continues on anastrozole and tolerates this well over all. She denies hot flashes or vaginal changes. She does have hand and joint problems, that likely predated starting anastrozole. She had a carpal tunnel surgery to her left hand earlier this month, and she is having routine injections to her right trigger finger. The interval history is generally unremarkable.  Review of Systems: Briana Smith denies fevers, or changes in bowel habits. She has noticed that she is having to urinate more frequently, but believes this is related to age. She has occasional nausea if she takes her pills on an empty stomach or "gets too hungry" and has an empty stomach. Related issues are a history of heartburn, that she claims is worse after zometa. She denies jaw pain or any other symptoms of osteonecrosis of the jaw. She denies shortness of breath, chest pain, cough, or palpitations. She has no headaches, dizziness, or vision changes. A detailed review of systems is otherwise stable.  FAMILY HISTORY:  The patient's father died at the age of 82 from COPD/emphysema in the setting of tobacco abuse.  The patient's mother died at the age of 37 in the setting of dementia.  The patient has one brother with a history of neck cancer.  There is no breast or ovarian cancer in the family to her knowledge.  GYNECOLOGIC HISTORY:  She is Gx, P3, menarche age 89 and again status post TAH-BSO in 60 after which she took hormone replacement for approximately 24 years.  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  Daughter Santiago Glad lives in [place] and she works as a Software engineer.  Daughter [name] lives in [place] and works as a Music therapist Son Lanny Hurst lives in [place] and works for [employer].  The patient has three grandchildren.  She attends the [employer].  Medications:   Current Outpatient Prescriptions  Medication Sig Dispense Refill  . aspirin 81 MG tablet Take 81 mg by mouth daily.    Marland Kitchen CALCIUM PO Take by mouth.      . Cholecalciferol (D-3-5 PO) Take 1,000 Units by mouth daily.     Marland Kitchen lisinopril-hydrochlorothiazide (PRINZIDE,ZESTORETIC) 20-12.5 MG per tablet TAKE ONE-HALF TABLET BY MOUTH IN THE MORNING 50 tablet 3  . MAGNESIUM PO Take 250 mg by mouth daily.     . potassium chloride (K-DUR) 10 MEQ tablet Take 10 mEq by mouth daily.    Marland Kitchen pyridoxine (B-6) 100 MG tablet Take 100 mg by mouth daily.    Marland Kitchen  timolol (TIMOPTIC) 0.5 % ophthalmic solution BID times 48H.    Marland Kitchen Zoledronic Acid (ZOMETA IV) Inject into the vein. Infusion done every 6 months.    Marland Kitchen anastrozole (ARIMIDEX) 1 MG tablet Take 1 tablet (1 mg total) by mouth daily. 30 tablet 5  . Multiple Vitamin (MULTIVITAMIN) tablet Take 1 tablet by mouth daily.      Marland Kitchen omeprazole (PRILOSEC) 40 MG capsule Take 1 capsule (40 mg total) by mouth Nightly. 1 tablet nightly for 14 days then stop. If symptoms return start for 7 more days. 28 capsule 0   No current facility-administered medications for this visit.    Allergies:  Allergies  Allergen Reactions  . Peanut-Containing Drug Products     All nuts cause fever blisters. *Pecans.      Physical Exam: Middle-aged white woman who appears stated age 53 Vitals:   07/25/15 1052  BP: 158/66  Pulse: 74  Temp: 98.5 F (36.9 C)  Resp: 18  Body mass index is 35.7 kg/(m^2). Filed Weights   07/25/15 1052  Weight: 182 lb 12.8 oz (82.918 kg)   ECOG:  1 Skin: warm, dry  HEENT: sclerae anicteric, conjunctivae pink, oropharynx  clear. No thrush or mucositis.  Lymph Nodes: No cervical or supraclavicular lymphadenopathy  Lungs: clear to auscultation bilaterally, no rales, wheezes, or rhonci  Heart: regular rate and rhythm  Abdomen: round, soft, non tender, positive bowel sounds  Musculoskeletal: No focal spinal tenderness, no peripheral edema  Neuro: non focal, well oriented, positive affect  Breasts: left breast status post lumpectomy and radiation. No evidence of recurrent disease. Left axilla benign. Right breast unremarkable.  Lab Results: Lab Results  Component Value Date   WBC 6.4 07/18/2015   HGB 12.7 07/18/2015   HCT 39.0 07/18/2015   MCV 92.0 07/18/2015   PLT 188 07/18/2015   NEUTROABS 5.0 07/18/2015     Chemistry      Component Value Date/Time   NA 140 07/18/2015 1027   NA 142 03/20/2015 0506   K 3.9 07/18/2015 1027   K 3.8 03/20/2015 0506   CL 105 03/20/2015 0506   CL 104 01/13/2013 1101   CO2 28 07/18/2015 1027   CO2 27 03/20/2015 0506   BUN 19.8 07/18/2015 1027   BUN 19 03/20/2015 0506   CREATININE 0.9 07/18/2015 1027   CREATININE 0.74 03/20/2015 0506      Component Value Date/Time   CALCIUM 9.5 07/18/2015 1027   CALCIUM 8.9 03/20/2015 0506   ALKPHOS 65 07/18/2015 1027   ALKPHOS 35* 03/20/2015 0506   AST 18 07/18/2015 1027   AST 19 03/20/2015 0506   ALT 19 07/18/2015 1027   ALT 16 03/20/2015 0506   BILITOT <0.30 07/18/2015 1027   BILITOT 0.7 03/20/2015 0506        Radiological Studies: No results found.   Assessment:  74 y.o.  Briana Smith woman   (1) status post left lumpectomy and sentinel lymph node sampling January 2012 for a pT1c pN1, stage IIB  invasive ductal carcinoma, grade 2,  strongly estrogen and progesterone receptor positive, HER2 negative, MIB-1 of 44%,  (2) treated adjuvantly with docetaxel and cyclophosphamide x4 completed May 2012   (3) radiation completed August 2012.   (4) began on tamoxifen in August 2012, switched to anastrozole 10/06/2013  (5)  DEXA scan 08/31/2013 shows osteopenia with a T score of -2.0; zolendronate started April 2015, to be repeated every 6 months while on anastrozole.  Plan:  [place] is doing well as far as her  breast cancer is concerned. She is now 4.5 years out from her definitive surgery with no evidence of recurrent disease. The labs were reviewed in detail and were stable. She is tolerating the anastrozole to the best of her ability, and will at least continue it through early next year. She will decised at that point whether or not to switch back to tamoxifen through her final 6 months of antiestrogen therapy.   Her dentist scared her with information that she could get osteonecrosis through the jaw, but through discussion she understands that this risk would be low in her case due to the good condition of her teeth and dental hygiene with regular cleanings. She will proceed with her next dose today, but given that she may switch to tamoxifen, she will likely stop the zometa altogether after today.   Mirta is due for a repeat mammogram and bone density scan in December. She will return in 6 months for labs and a follow up visit. She understands and agrees with this plan. She knows the goal of treatment in her case is cure. She has been encouraged to call with any issues that might arise before her next visit here.  Laurie Panda, NP 07/25/2015

## 2015-07-30 DIAGNOSIS — H25012 Cortical age-related cataract, left eye: Secondary | ICD-10-CM | POA: Diagnosis not present

## 2015-07-30 DIAGNOSIS — H40053 Ocular hypertension, bilateral: Secondary | ICD-10-CM | POA: Diagnosis not present

## 2015-07-30 DIAGNOSIS — H2511 Age-related nuclear cataract, right eye: Secondary | ICD-10-CM | POA: Diagnosis not present

## 2015-07-30 DIAGNOSIS — H25011 Cortical age-related cataract, right eye: Secondary | ICD-10-CM | POA: Diagnosis not present

## 2015-08-15 DIAGNOSIS — Z23 Encounter for immunization: Secondary | ICD-10-CM | POA: Diagnosis not present

## 2015-08-22 ENCOUNTER — Other Ambulatory Visit: Payer: Self-pay | Admitting: Oncology

## 2015-09-24 ENCOUNTER — Ambulatory Visit
Admission: RE | Admit: 2015-09-24 | Discharge: 2015-09-24 | Disposition: A | Discharge status: Home / Self Care | Payer: Medicare HMO | Source: Ambulatory Visit | Attending: Nurse Practitioner | Admitting: Nurse Practitioner

## 2015-09-24 DIAGNOSIS — E2839 Other primary ovarian failure: Secondary | ICD-10-CM

## 2015-09-24 DIAGNOSIS — C50512 Malignant neoplasm of lower-outer quadrant of left female breast: Secondary | ICD-10-CM

## 2015-09-24 DIAGNOSIS — R928 Other abnormal and inconclusive findings on diagnostic imaging of breast: Secondary | ICD-10-CM | POA: Diagnosis not present

## 2015-09-24 DIAGNOSIS — M8580 Other specified disorders of bone density and structure, unspecified site: Secondary | ICD-10-CM

## 2015-09-24 DIAGNOSIS — M8589 Other specified disorders of bone density and structure, multiple sites: Secondary | ICD-10-CM | POA: Diagnosis not present

## 2015-11-06 DIAGNOSIS — C50512 Malignant neoplasm of lower-outer quadrant of left female breast: Secondary | ICD-10-CM | POA: Diagnosis not present

## 2016-01-02 ENCOUNTER — Encounter: Payer: Self-pay | Admitting: Gastroenterology

## 2016-01-21 ENCOUNTER — Other Ambulatory Visit (INDEPENDENT_AMBULATORY_CARE_PROVIDER_SITE_OTHER): Payer: Medicare HMO

## 2016-01-21 DIAGNOSIS — Z0000 Encounter for general adult medical examination without abnormal findings: Secondary | ICD-10-CM

## 2016-01-21 LAB — BASIC METABOLIC PANEL
BUN: 18 mg/dL (ref 6–23)
CHLORIDE: 104 meq/L (ref 96–112)
CO2: 31 mEq/L (ref 19–32)
CREATININE: 0.91 mg/dL (ref 0.40–1.20)
Calcium: 9.8 mg/dL (ref 8.4–10.5)
GFR: 63.68 mL/min (ref >60.00)
Glucose, Bld: 105 mg/dL — ABNORMAL HIGH (ref 70–99)
Potassium: 4.7 mEq/L (ref 3.5–5.1)
Sodium: 142 mEq/L (ref 135–145)

## 2016-01-21 LAB — CBC WITH DIFFERENTIAL/PLATELET
BASOS ABS: 0 10*3/uL (ref 0.0–0.1)
BASOS PCT: 0.8 % (ref 0.0–3.0)
EOS ABS: 0.1 10*3/uL (ref 0.0–0.7)
Eosinophils Relative: 2.3 % (ref 0.0–5.0)
HEMATOCRIT: 37.3 % (ref 36.0–46.0)
HEMOGLOBIN: 12.5 g/dL (ref 12.0–15.0)
LYMPHS PCT: 20.6 % (ref 12.0–46.0)
Lymphs Abs: 0.9 10*3/uL (ref 0.7–4.0)
MCHC: 33.5 g/dL (ref 30.0–36.0)
MCV: 90.5 fl (ref 78.0–100.0)
MONO ABS: 0.3 10*3/uL (ref 0.1–1.0)
Monocytes Relative: 7.5 % (ref 3.0–12.0)
Neutro Abs: 2.9 10*3/uL (ref 1.4–7.7)
Neutrophils Relative %: 68.8 % (ref 43.0–77.0)
Platelets: 177 10*3/uL (ref 150.0–400.0)
RBC: 4.12 Mil/uL (ref 3.87–5.11)
RDW: 14.1 % (ref 11.5–15.5)
WBC: 4.2 10*3/uL (ref 4.0–10.5)

## 2016-01-21 LAB — POC URINALSYSI DIPSTICK (AUTOMATED)
Bilirubin, UA: NEGATIVE
GLUCOSE UA: NEGATIVE
KETONES UA: NEGATIVE
Nitrite, UA: NEGATIVE
SPEC GRAV UA: 1.025
Urobilinogen, UA: 0.2
pH, UA: 7

## 2016-01-21 LAB — HEPATIC FUNCTION PANEL
ALK PHOS: 42 U/L (ref 39–117)
ALT: 14 U/L (ref 0–35)
AST: 14 U/L (ref 0–37)
Albumin: 4.1 g/dL (ref 3.5–5.2)
BILIRUBIN DIRECT: 0.1 mg/dL (ref 0.0–0.3)
BILIRUBIN TOTAL: 0.5 mg/dL (ref 0.2–1.2)
TOTAL PROTEIN: 6.9 g/dL (ref 6.0–8.3)

## 2016-01-21 LAB — LIPID PANEL
Cholesterol: 215 mg/dL — ABNORMAL HIGH (ref 0–200)
HDL: 58.8 mg/dL (ref >39.00)
LDL CALC: 134 mg/dL — AB (ref 0–99)
NONHDL: 155.7
Total CHOL/HDL Ratio: 4
Triglycerides: 108 mg/dL (ref 0.0–149.0)
VLDL: 21.6 mg/dL (ref 0.0–40.0)

## 2016-01-21 LAB — TSH: TSH: 2.99 u[IU]/mL (ref 0.35–4.50)

## 2016-01-28 ENCOUNTER — Ambulatory Visit (INDEPENDENT_AMBULATORY_CARE_PROVIDER_SITE_OTHER): Payer: Medicare HMO | Admitting: Family Medicine

## 2016-01-28 ENCOUNTER — Encounter: Payer: Self-pay | Admitting: Family Medicine

## 2016-01-28 VITALS — BP 120/78 | Temp 98.3°F | Ht 60.25 in | Wt 174.0 lb

## 2016-01-28 DIAGNOSIS — C50512 Malignant neoplasm of lower-outer quadrant of left female breast: Secondary | ICD-10-CM

## 2016-01-28 DIAGNOSIS — I10 Essential (primary) hypertension: Secondary | ICD-10-CM | POA: Diagnosis not present

## 2016-01-28 DIAGNOSIS — Z85828 Personal history of other malignant neoplasm of skin: Secondary | ICD-10-CM | POA: Diagnosis not present

## 2016-01-28 DIAGNOSIS — R739 Hyperglycemia, unspecified: Secondary | ICD-10-CM

## 2016-01-28 DIAGNOSIS — Z0000 Encounter for general adult medical examination without abnormal findings: Secondary | ICD-10-CM

## 2016-01-28 MED ORDER — POTASSIUM CHLORIDE ER 10 MEQ PO TBCR: 10.0000 meq | EXTENDED_RELEASE_TABLET | Freq: Every day | ORAL | Status: DC

## 2016-01-28 MED ORDER — LISINOPRIL-HYDROCHLOROTHIAZIDE 20-12.5 MG PO TABS: ORAL_TABLET | ORAL | Status: DC

## 2016-01-28 NOTE — Patient Instructions (Addendum)
Avoid sugar  Walk 30 minutes daily  Continue current medications  Follow-up in one year sooner if any problems  Call in November for your physical exam April 2018,,,,,,,,,, Tommi Rumps or Almyra Free are 2 new adult nurse practitioners or Dr. Martinique  I would recommend you go to [facility] audiology department to be evaluated to see if hearing aids would be of benefit for you

## 2016-01-28 NOTE — Progress Notes (Signed)
Subjective:    Patient ID: Briana Smith, female    DOB: 06/07/1941, 75 y.o.   MRN: [id number]  HPI Briana Smith is a 75 year old married female nonsmoker who comes in today for general physical examination because of a history of hypertension, and breast cancer  Her blood pressures 120/78 on Zestoretic 20-12 0.5 daily.  She takes Arimidex at the instructions of her oncologist Dr. Jannifer Rodney. She is 5 years out from having surgery and postop radiation and is doing extremely well.  She gets routine eye care, dental care, does not do BSE monthly but does get an annual mammogram. Colonoscopy 2009 normal  Vaccinations up-to-date  Judeen Hammans uterus and ovaries removed many years ago for nonmalignant reasons. She had severe endometriosis and dysfunctional uterine bleeding.  Social history she is married she lives here in [place] she walks daily.  Home health safety reviewed no issues identified, no guns in the house, she does have a healthcare power of attorney and living well. Cognitive function normal    Review of Systems  Constitutional: Negative.   HENT: Negative.   Eyes: Negative.   Respiratory: Negative.   Cardiovascular: Negative.   Gastrointestinal: Negative.   Endocrine: Negative.   Genitourinary: Negative.   Musculoskeletal: Negative.   Skin: Negative.   Allergic/Immunologic: Negative.   Neurological: Negative.   Hematological: Negative.   Psychiatric/Behavioral: Negative.        Objective:   Physical Exam  Constitutional: She is oriented to person, place, and time. She appears well-developed and well-nourished.  HENT:  Head: Normocephalic and atraumatic.  Right Ear: External ear normal.  Left Ear: External ear normal.  Nose: Nose normal.  Mouth/Throat: Oropharynx is clear and moist.  Moderate to severe hearing loss  Eyes: EOM are normal. Pupils are equal, round, and reactive to light.  Neck: Normal range of motion. Neck supple. No JVD present. No tracheal deviation  present. No thyromegaly present.  Cardiovascular: Normal rate, regular rhythm, normal heart sounds and intact distal pulses.  Exam reveals no gallop and no friction rub.   No murmur heard. No carotid nor aortic bruits peripheral pulses 2+ and symmetrical  Pulmonary/Chest: Effort normal and breath sounds normal. No stridor. No respiratory distress. She has no wheezes. She has no rales. She exhibits no tenderness.  Abdominal: Soft. Bowel sounds are normal. She exhibits no distension and no mass. There is no tenderness. There is no rebound and no guarding.  Genitourinary:  Bilateral breast exam shows scar left breast 6:00 from previous excisional biopsy. Also tattoos from postop radiation.  She had her uterus and ovaries removed for nonmalignant reasons therefore pelvic exams no longer indicated  Musculoskeletal: Normal range of motion.  Lymphadenopathy:    She has no cervical adenopathy.  Neurological: She is alert and oriented to person, place, and time. She has normal reflexes. No cranial nerve deficit. She exhibits normal muscle tone. Coordination normal.  Skin: Skin is warm and dry. No rash noted. No erythema. No pallor.  Total body skin exam normal except for scars on her back where she's had premalignant lesions removed  Psychiatric: She has a normal mood and affect. Her behavior is normal. Judgment and thought content normal.  Nursing note and vitals reviewed.         Assessment & Plan:  Healthy female  5 years status post left breast cancer treated with surgical excision with postoperative radiation.... Continue follow-up by surgery and oncology  Hypertension ago continue current therapy  Severe hearing loss........Marland Kitchen recommend hearing aid evaluation  at [facility]  Status post TAH/BSO for nonmalignant reasons  Elevated blood sugar........ not diabetic.... Avoid sugar... Walk 30 minutes daily

## 2016-01-28 NOTE — Progress Notes (Signed)
Pre visit review using our clinic review tool, if applicable. No additional management support is needed unless otherwise documented below in the visit note. 

## 2016-01-30 ENCOUNTER — Other Ambulatory Visit: Payer: Self-pay | Admitting: *Deleted

## 2016-01-30 DIAGNOSIS — C50512 Malignant neoplasm of lower-outer quadrant of left female breast: Secondary | ICD-10-CM

## 2016-01-31 ENCOUNTER — Ambulatory Visit (HOSPITAL_BASED_OUTPATIENT_CLINIC_OR_DEPARTMENT_OTHER): Payer: Medicare HMO

## 2016-01-31 ENCOUNTER — Ambulatory Visit (HOSPITAL_BASED_OUTPATIENT_CLINIC_OR_DEPARTMENT_OTHER): Payer: Medicare HMO | Admitting: Oncology

## 2016-01-31 ENCOUNTER — Other Ambulatory Visit (HOSPITAL_BASED_OUTPATIENT_CLINIC_OR_DEPARTMENT_OTHER): Payer: Medicare HMO

## 2016-01-31 VITALS — BP 155/64 | HR 64 | Temp 98.7°F | Resp 18 | Ht 60.25 in | Wt 176.0 lb

## 2016-01-31 DIAGNOSIS — I10 Essential (primary) hypertension: Secondary | ICD-10-CM

## 2016-01-31 DIAGNOSIS — Z85828 Personal history of other malignant neoplasm of skin: Secondary | ICD-10-CM

## 2016-01-31 DIAGNOSIS — C50512 Malignant neoplasm of lower-outer quadrant of left female breast: Secondary | ICD-10-CM

## 2016-01-31 DIAGNOSIS — Z9221 Personal history of antineoplastic chemotherapy: Secondary | ICD-10-CM

## 2016-01-31 DIAGNOSIS — Z170 Estrogen receptor positive status [ER+]: Secondary | ICD-10-CM

## 2016-01-31 DIAGNOSIS — C50912 Malignant neoplasm of unspecified site of left female breast: Secondary | ICD-10-CM

## 2016-01-31 DIAGNOSIS — M8580 Other specified disorders of bone density and structure, unspecified site: Secondary | ICD-10-CM

## 2016-01-31 DIAGNOSIS — Z79811 Long term (current) use of aromatase inhibitors: Secondary | ICD-10-CM

## 2016-01-31 LAB — COMPREHENSIVE METABOLIC PANEL
ALBUMIN: 3.7 g/dL (ref 3.5–5.0)
ALK PHOS: 45 U/L (ref 40–150)
ALT: 17 U/L (ref 0–55)
ANION GAP: 6 meq/L (ref 3–11)
AST: 15 U/L (ref 5–34)
BILIRUBIN TOTAL: 0.33 mg/dL (ref 0.20–1.20)
BUN: 17.1 mg/dL (ref 7.0–26.0)
CO2: 28 meq/L (ref 22–29)
Calcium: 9.5 mg/dL (ref 8.4–10.4)
Chloride: 107 mEq/L (ref 98–109)
Creatinine: 0.9 mg/dL (ref 0.6–1.1)
EGFR: 63 mL/min/{1.73_m2} — AB (ref >90)
GLUCOSE: 62 mg/dL — AB (ref 70–140)
POTASSIUM: 4.8 meq/L (ref 3.5–5.1)
SODIUM: 141 meq/L (ref 136–145)
TOTAL PROTEIN: 7 g/dL (ref 6.4–8.3)

## 2016-01-31 LAB — CBC WITH DIFFERENTIAL/PLATELET
BASO%: 1 % (ref 0.0–2.0)
BASOS ABS: 0 10*3/uL (ref 0.0–0.1)
EOS ABS: 0.1 10*3/uL (ref 0.0–0.5)
EOS%: 2.3 % (ref 0.0–7.0)
HCT: 37.5 % (ref 34.8–46.6)
HGB: 12.3 g/dL (ref 11.6–15.9)
LYMPH%: 23 % (ref 14.0–49.7)
MCH: 30 pg (ref 25.1–34.0)
MCHC: 32.7 g/dL (ref 31.5–36.0)
MCV: 91.5 fL (ref 79.5–101.0)
MONO#: 0.4 10*3/uL (ref 0.1–0.9)
MONO%: 9.1 % (ref 0.0–14.0)
NEUT%: 64.6 % (ref 38.4–76.8)
NEUTROS ABS: 2.7 10*3/uL (ref 1.5–6.5)
PLATELETS: 151 10*3/uL (ref 145–400)
RBC: 4.09 10*6/uL (ref 3.70–5.45)
RDW: 13.6 % (ref 11.2–14.5)
WBC: 4.2 10*3/uL (ref 3.9–10.3)
lymph#: 1 10*3/uL (ref 0.9–3.3)

## 2016-01-31 MED ORDER — ZOLEDRONIC ACID 4 MG/100ML IV SOLN
4.0000 mg | Freq: Once | INTRAVENOUS | Status: AC
  Administered 2016-01-31: 4 mg via INTRAVENOUS
  Filled 2016-01-31: qty 100

## 2016-01-31 MED ORDER — SODIUM CHLORIDE 0.9 % IV SOLN
Freq: Once | INTRAVENOUS | Status: AC
  Administered 2016-01-31: 12:00:00 via INTRAVENOUS

## 2016-01-31 NOTE — Progress Notes (Signed)
Hematology and Oncology Follow Up Visit  Briana Smith [id number] Mar 15, 1941 75 y.o. 01/31/2016   PCP: Joycelyn Man, MD GYN: SU:  OTHER MD: Carmon Sails, [name], Ethelene Hal  CHIEF COMPLAINT: Estrogen receptor positive breast cancer  CURRENT TREATMENT: Anastrozole  HPI: From the original intake note:  Briana Smith had screening mammography August 14, 2010 at the [facility] showing a possible mass in the left breast.  She was brought back for additional views November 22 and Dr. Sadie Haber was able to confirm an ill defined mass in the inferior portion of the left breast which was not palpable.  Ultrasound showed this to be irregularly marginated and to measure approximately 1 cm.  The left axilla was unremarkable.  Biopsy was performed the same day and showed ([id number]) an intermediate grade invasive ductal carcinoma which was estrogen receptor positive at 100%, progesterone receptor positive at 99% with an elevated MIB-1 at 44%, but no HER2/neu amplification by CISH with a ratio of 1.62.  Bilateral breast MRIs were obtained on September 04, 2010.  This confirmed a 1.7 cm irregularly marginated mass in the left breast consistent with the previously diagnosed cancer.  They could also see the clip from her prior benign left breast biopsy obtained in 2000.   However, to complicate things there was an area in the left and upper manubrium with abnormal signal intensity.  It was felt that a further evaluation would be helpful and the patient had a bone scan on December 7.  This showed a strong focus of uptake in the central upper sternum and a second one at the sternal manubrial junction slightly to the left.  There was also a symmetric increased uptake in the right proximal humerus and a smaller area of uptake again rather faint in the left proximal femur.  This was felt to be most consistent with metastatic disease.  The patient then saw Dr. Humphrey Rolls in our office and she discussed her  disease in terms of stage IV breast cancer.   Dr. Humphrey Rolls set the patient up for a PET scan which was performed on December 12 with associated CT scan.  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  There was some fluid within the joints as well on MRI review.  Similarly, with the lesion in the sternomanubrial joints, there was an SUV maximum of 3.1 and significant sclerosis more consistent with arthropathy.  The increased uptake in the right proximal humerus could not be reproduced by CT or PET.  The breast primary had an SUV of 1.8 and measured 1.0 cm on these images.  There was no adenopathy in the axilla or chest and the rest of the CT/PET was negative except for an 8 mm right thyroid nodule which will require a work up at some point seen only the CT scan, not on the PET scan.  There was a nonhypermetabolic right hepatic lobe lesion felt to be most consistent with a complex cyst or hemangioma.  There was also hemangioma in the L3 vertebral body and in other, thoracic vertebrae.  Finally, there was an exophytic upper pole left renal lesion felt to be most likely a cyst.  The patient then sought a second opinion under Dr. Gypsy Balsam at [facility] and she was seen there October 14, 2010.  Dr. Philipp Ovens repeated the bone scan and PET scan and proceeded to obtain plain  films of the left hip and proximal humerus. Those studies confirmed that the patient essentially she had some arthritis but no evidence of metastatic disease. The patient then proceeded to left partial mastectomy with sentinel lymph node sampling October 23, 2010.  The results from that procedure ([id number]) showed a 1.2 cm grade 2 invasive ductal carcinoma with 1 of 2 sentinel lymph nodes involved.  Margins were negative.  There was evidence of lymphovascular invasion.  HER2/neu CISH was repeated and was again negative with the ratio of  1.58.  Briana Smith received adjuvant chemotherapy consisting of 4 cycles of docetaxel and cyclophosphamide. Chemotherapy was completed in May of 2012 and was followed by radiation therapy. Radiation was completed in mid August 2012, at which time patient began on tamoxifen, 20 mg daily.   Her subsequent history is as detailed below.  Interim History:   Briana Smith returns today for follow up of her Estrogen receptor positive breast cancer.  She remains on anastrozole. She generally tolerates it well, except that she has significant pain in the right wrist and she wonders if that is related. She had significant pain in her left wrist which got much better when she had surgery for carpal tunnel. The pain on the right wrist is a little bit different. It is more at the base of the thumb. This is of course a classic site of arthritic pain. My suspicion is that this is not related to the anastrozole.  Review of Systems: Briana Smith  Exercises on her treadmill at least a mile almost every day. She sleeps poorly. Sometimes she has blurred vision. This is very inconstant. Her hearing is poor. She gets short of breath when walking up stairs, but has no chest pain or pressure. She has multiple areas of arthritis pain, which again are not more persistent or intense than before. A detailed review of systems today was otherwise stable  FAMILY HISTORY:  The patient's father died at the age of 20 from COPD/emphysema in the setting of tobacco abuse.  The patient's mother died at the age of 24 in the setting of dementia.  The patient has one brother with a history of neck cancer.  There is no breast or ovarian cancer in the family to her knowledge.  GYNECOLOGIC HISTORY:  She is Gx, P3, menarche age 15 and again status post TAH-BSO in 76 after which she took hormone replacement for approximately 24 years.  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  Daughter  Briana Smith lives in [place] and she works as a Software engineer.  Daughter Briana Smith lives in [place] and works as a Music therapist Son Briana Smith lives in [place] and works for [employer].  The patient has three grandchildren.  She attends the [employer].  Medications:   Current Outpatient Prescriptions  Medication Sig Dispense Refill  . anastrozole (ARIMIDEX) 1 MG tablet Take 1 tablet (1 mg total) by mouth daily. 30 tablet 5  . anastrozole (ARIMIDEX) 1 MG tablet TAKE ONE TABLET BY MOUTH ONCE DAILY 30 tablet 0  . aspirin 81 MG tablet Take 81 mg by mouth daily.    Marland Kitchen CALCIUM PO Take by mouth.      . Cholecalciferol (D-3-5 PO) Take 1,000 Units by mouth daily.     Marland Kitchen lisinopril-hydrochlorothiazide (PRINZIDE,ZESTORETIC) 20-12.5 MG tablet TAKE ONE-HALF TABLET BY MOUTH IN THE MORNING 50 tablet 3  . MAGNESIUM PO Take 250 mg by mouth  daily.     . Multiple Vitamin (MULTIVITAMIN) tablet Take 1 tablet by mouth daily.      . potassium chloride (K-DUR) 10 MEQ tablet Take 1 tablet (10 mEq total) by mouth daily. 100 tablet 3  . pyridoxine (B-6) 100 MG tablet Take 100 mg by mouth daily.    . timolol (TIMOPTIC) 0.5 % ophthalmic solution BID times 48H.    Marland Kitchen Zoledronic Acid (ZOMETA IV) Inject into the vein. Infusion done every 6 months.     No current facility-administered medications for this visit.    Allergies:  Allergies  Allergen Reactions  . Peanut-Containing Drug Products     All nuts cause fever blisters. *Pecans.      Physical Exam: Middle-aged white woman  In no acute distress Filed Vitals:   01/31/16 1027  BP: 155/64  Pulse: 64  Temp: 98.7 F (37.1 C)  Resp: 18  Body mass index is 34.1 kg/(m^2). Filed Weights   01/31/16 1027  Weight: 176 lb (79.833 kg)   Sclerae unicteric, pupils round and equal Oropharynx clear and moist-- no thrush or other lesions No cervical or supraclavicular adenopathy Lungs no rales or rhonchi Heart regular rate and rhythm Abd soft,obese,  nontender, positive bowel sounds MSK no focal spinal tenderness, no upper extremity lymphedema Neuro: nonfocal, well oriented, appropriate affect Breasts: the right breast is unremarkable. The left breast is status post lumpectomy and radiation. There is no evidence of local recurrence. The left axilla is benign.   Lab Results: Lab Results  Component Value Date   WBC 4.2 01/31/2016   HGB 12.3 01/31/2016   HCT 37.5 01/31/2016   MCV 91.5 01/31/2016   PLT 151 01/31/2016   NEUTROABS 2.7 01/31/2016     Chemistry      Component Value Date/Time   NA 142 01/21/2016 0845   NA 140 07/18/2015 1027   K 4.7 01/21/2016 0845   K 3.9 07/18/2015 1027   CL 104 01/21/2016 0845   CL 104 01/13/2013 1101   CO2 31 01/21/2016 0845   CO2 28 07/18/2015 1027   BUN 18 01/21/2016 0845   BUN 19.8 07/18/2015 1027   CREATININE 0.91 01/21/2016 0845   CREATININE 0.9 07/18/2015 1027      Component Value Date/Time   CALCIUM 9.8 01/21/2016 0845   CALCIUM 9.5 07/18/2015 1027   ALKPHOS 42 01/21/2016 0845   ALKPHOS 65 07/18/2015 1027   AST 14 01/21/2016 0845   AST 18 07/18/2015 1027   ALT 14 01/21/2016 0845   ALT 19 07/18/2015 1027   BILITOT 0.5 01/21/2016 0845   BILITOT <0.30 07/18/2015 1027        Radiological Studies:  DUAL X-RAY ABSORPTIOMETRY (DXA) FOR BONE MINERAL DENSITY  IMPRESSION: Referring Physician: Laurie Panda  PATIENT: Name: Mack, Thurmon Patient ID: [id number] Birth Date: January 07, 1941 Height: 60.0 in. Sex: Female Measured: 09/24/2015 Weight: 184.0 lbs. Indications: Advanced Age, Bilateral Ovariectomy (65.51), Breast Cancer History, Estrogen Deficient, Hysterectomy, Postmenopausal Fractures: Treatments: Calcium (E943.0), Vitamin D (E933.5), zometa  ASSESSMENT: The BMD measured at Femur Neck Right is 0.761 g/cm2 with a T-score of -2.0. This patient is considered to be osteopenic according to [facility]) criteria. There has been no statistically  significant change in BMD of Lumbar spine or the left hip since prior exam dated 08/31/2013.  Site Region Measured Date Measured Age YA BMD Significant CHANGE T-score DualFemur Neck Right 09/24/2015 76.8 -2.0 0.761 g/cm2  AP Spine L1-L4 09/24/2015 76.8 -1.4 1.023 g/cm2  Assessment:  75 y.o.  [place] woman   (1) status post left lumpectomy and sentinel lymph node sampling January 2012 for a pT1c pN1, stage IIB  invasive ductal carcinoma, grade 2,  strongly estrogen and progesterone receptor positive, HER2 negative, MIB-1 of 44%,  (2) treated adjuvantly with docetaxel and cyclophosphamide x4 completed May 2012   (3) radiation completed August 2012.   (4) began on tamoxifen in August 2012, switched to anastrozole 10/06/2013, completing 5 years of anti-estrogens August 2017  (5) DEXA scan 08/31/2013 shows osteopenia with a T score of -2.0;  (a) zolendronate started April 2015, repeated every 6 months while on anastrozole.  (b) repeat bone density December 2016 stable  Plan:  [place]  In August. She is more than 5 years out from definitive surgery for her breast cancer, with no evidence of disease recurrence. This is very favorable.  Her left wrist did improve with surgery, but she has significant pain in the right wrist area. I do think this is arthritis and not due to the anastrozole. I suggested she use a splint to keep the wrist straight when she does housework or any other activities and also use it at night when she sleeps. Hopefully that will help the discomfort.  Alternatively we could switch to tamoxifen for the last few months of her treatment. However she is concerned regarding blood clots and just prefers to stay on the anastrozole, which I think is very reasonable.  We discussed bone density issues. Her bone density December 2016 was entirely stable. She will receive her final dose of zolendronate today. She understands that in addition to  helping her bone density zolendronate also decreases the risk of breast cancer recurrence by 2-3%, above and beyond the significant risk reduction she has obtained through the anti-estrogens.  At this time I am comfortable releasing her from follow-up here. We did discuss survivorship but she just wants to be followed through her primary care physician. All she will need in terms of breast cancer follow-up is a yearly mammography and a yearly physician breast exam  I will be Smith to see Lael at any point in the future if on when the need arises, but as of now more making no further routine appointments for her here.  Chauncey Cruel, MD 01/31/2016

## 2016-01-31 NOTE — Patient Instructions (Signed)

## 2016-02-01 DIAGNOSIS — H40053 Ocular hypertension, bilateral: Secondary | ICD-10-CM | POA: Diagnosis not present

## 2016-02-01 DIAGNOSIS — H25011 Cortical age-related cataract, right eye: Secondary | ICD-10-CM | POA: Diagnosis not present

## 2016-02-01 DIAGNOSIS — H2511 Age-related nuclear cataract, right eye: Secondary | ICD-10-CM | POA: Diagnosis not present

## 2016-02-01 DIAGNOSIS — H25012 Cortical age-related cataract, left eye: Secondary | ICD-10-CM | POA: Diagnosis not present

## 2016-02-02 ENCOUNTER — Other Ambulatory Visit: Payer: Self-pay | Admitting: Family Medicine

## 2016-03-31 ENCOUNTER — Other Ambulatory Visit: Payer: Self-pay | Admitting: Oncology

## 2016-04-01 ENCOUNTER — Telehealth: Payer: Self-pay | Admitting: *Deleted

## 2016-04-01 NOTE — Telephone Encounter (Signed)
VM message from patient requesting refill on her anastrozole. Anastrozole does not appear on her med list. Per Dr. Virgie Dad note from April 2017, pt should be completing her anastrozole in August 2017.  Please confirm and pt will need refills.  Per mutual agreement pt has no more follow up appts with Dr. Jana Hakim. Pt to f/u with her PCP.

## 2016-05-06 DIAGNOSIS — R69 Illness, unspecified: Secondary | ICD-10-CM | POA: Diagnosis not present

## 2016-05-12 DIAGNOSIS — H2513 Age-related nuclear cataract, bilateral: Secondary | ICD-10-CM | POA: Diagnosis not present

## 2016-05-12 DIAGNOSIS — H40053 Ocular hypertension, bilateral: Secondary | ICD-10-CM | POA: Diagnosis not present

## 2016-05-12 DIAGNOSIS — H2511 Age-related nuclear cataract, right eye: Secondary | ICD-10-CM | POA: Diagnosis not present

## 2016-07-08 DIAGNOSIS — H2511 Age-related nuclear cataract, right eye: Secondary | ICD-10-CM | POA: Diagnosis not present

## 2016-07-08 DIAGNOSIS — Z961 Presence of intraocular lens: Secondary | ICD-10-CM | POA: Diagnosis not present

## 2016-07-08 DIAGNOSIS — H5201 Hypermetropia, right eye: Secondary | ICD-10-CM | POA: Diagnosis not present

## 2016-07-08 DIAGNOSIS — H5212 Myopia, left eye: Secondary | ICD-10-CM | POA: Diagnosis not present

## 2016-07-08 DIAGNOSIS — H52223 Regular astigmatism, bilateral: Secondary | ICD-10-CM | POA: Diagnosis not present

## 2016-07-15 DIAGNOSIS — H2512 Age-related nuclear cataract, left eye: Secondary | ICD-10-CM | POA: Diagnosis not present

## 2016-08-15 DIAGNOSIS — Z23 Encounter for immunization: Secondary | ICD-10-CM | POA: Diagnosis not present

## 2016-08-20 ENCOUNTER — Other Ambulatory Visit: Payer: Self-pay | Admitting: Oncology

## 2016-08-20 DIAGNOSIS — Z853 Personal history of malignant neoplasm of breast: Secondary | ICD-10-CM

## 2016-09-24 DIAGNOSIS — H40053 Ocular hypertension, bilateral: Secondary | ICD-10-CM | POA: Diagnosis not present

## 2016-09-24 DIAGNOSIS — Z961 Presence of intraocular lens: Secondary | ICD-10-CM | POA: Diagnosis not present

## 2016-09-24 DIAGNOSIS — H53022 Refractive amblyopia, left eye: Secondary | ICD-10-CM | POA: Diagnosis not present

## 2016-09-24 DIAGNOSIS — H16223 Keratoconjunctivitis sicca, not specified as Sjogren's, bilateral: Secondary | ICD-10-CM | POA: Diagnosis not present

## 2016-10-02 DIAGNOSIS — Z0100 Encounter for examination of eyes and vision without abnormal findings: Secondary | ICD-10-CM | POA: Diagnosis not present

## 2016-10-07 DIAGNOSIS — R69 Illness, unspecified: Secondary | ICD-10-CM | POA: Diagnosis not present

## 2016-10-08 ENCOUNTER — Ambulatory Visit
Admission: RE | Admit: 2016-10-08 | Discharge: 2016-10-08 | Disposition: A | Discharge status: Home / Self Care | Payer: Medicare HMO | Source: Ambulatory Visit | Attending: Oncology | Admitting: Oncology

## 2016-10-08 DIAGNOSIS — Z853 Personal history of malignant neoplasm of breast: Secondary | ICD-10-CM

## 2016-10-08 DIAGNOSIS — R928 Other abnormal and inconclusive findings on diagnostic imaging of breast: Secondary | ICD-10-CM | POA: Diagnosis not present

## 2016-10-09 ENCOUNTER — Telehealth: Payer: Self-pay | Admitting: Family Medicine

## 2016-10-09 NOTE — Telephone Encounter (Signed)
Pt states Dr Sherren Mocha told her to find another doc and her husband sees you. Pt would like to know if you will accept her as a pt?

## 2016-10-10 NOTE — Telephone Encounter (Signed)
Sorry but I am too full  

## 2016-10-14 DIAGNOSIS — D225 Melanocytic nevi of trunk: Secondary | ICD-10-CM | POA: Diagnosis not present

## 2016-10-14 DIAGNOSIS — L570 Actinic keratosis: Secondary | ICD-10-CM | POA: Diagnosis not present

## 2016-10-14 DIAGNOSIS — X32XXXA Exposure to sunlight, initial encounter: Secondary | ICD-10-CM | POA: Diagnosis not present

## 2016-10-21 NOTE — Telephone Encounter (Signed)
Pt is aware. Scheduled pt with Dr Yong Channel

## 2016-10-28 DIAGNOSIS — H26492 Other secondary cataract, left eye: Secondary | ICD-10-CM | POA: Diagnosis not present

## 2016-10-28 DIAGNOSIS — H52223 Regular astigmatism, bilateral: Secondary | ICD-10-CM | POA: Diagnosis not present

## 2016-10-28 DIAGNOSIS — H5201 Hypermetropia, right eye: Secondary | ICD-10-CM | POA: Diagnosis not present

## 2016-10-28 DIAGNOSIS — H3530 Unspecified macular degeneration: Secondary | ICD-10-CM | POA: Diagnosis not present

## 2016-10-28 DIAGNOSIS — H5212 Myopia, left eye: Secondary | ICD-10-CM | POA: Diagnosis not present

## 2016-10-28 DIAGNOSIS — Z961 Presence of intraocular lens: Secondary | ICD-10-CM | POA: Diagnosis not present

## 2016-12-03 DIAGNOSIS — Z961 Presence of intraocular lens: Secondary | ICD-10-CM | POA: Diagnosis not present

## 2016-12-03 DIAGNOSIS — H02839 Dermatochalasis of unspecified eye, unspecified eyelid: Secondary | ICD-10-CM | POA: Diagnosis not present

## 2016-12-03 DIAGNOSIS — H26492 Other secondary cataract, left eye: Secondary | ICD-10-CM | POA: Diagnosis not present

## 2016-12-03 DIAGNOSIS — H53022 Refractive amblyopia, left eye: Secondary | ICD-10-CM | POA: Diagnosis not present

## 2016-12-03 DIAGNOSIS — H40053 Ocular hypertension, bilateral: Secondary | ICD-10-CM | POA: Diagnosis not present

## 2016-12-11 DIAGNOSIS — H26491 Other secondary cataract, right eye: Secondary | ICD-10-CM | POA: Diagnosis not present

## 2016-12-11 DIAGNOSIS — H52222 Regular astigmatism, left eye: Secondary | ICD-10-CM | POA: Diagnosis not present

## 2016-12-11 DIAGNOSIS — Z961 Presence of intraocular lens: Secondary | ICD-10-CM | POA: Diagnosis not present

## 2017-02-02 ENCOUNTER — Encounter: Payer: Self-pay | Admitting: Family Medicine

## 2017-02-02 ENCOUNTER — Ambulatory Visit (INDEPENDENT_AMBULATORY_CARE_PROVIDER_SITE_OTHER): Payer: Medicare HMO | Admitting: Family Medicine

## 2017-02-02 VITALS — BP 134/70 | HR 61 | Temp 98.1°F | Ht 60.5 in | Wt 174.0 lb

## 2017-02-02 DIAGNOSIS — Z853 Personal history of malignant neoplasm of breast: Secondary | ICD-10-CM

## 2017-02-02 DIAGNOSIS — R05 Cough: Secondary | ICD-10-CM

## 2017-02-02 DIAGNOSIS — R739 Hyperglycemia, unspecified: Secondary | ICD-10-CM | POA: Diagnosis not present

## 2017-02-02 DIAGNOSIS — E785 Hyperlipidemia, unspecified: Secondary | ICD-10-CM | POA: Insufficient documentation

## 2017-02-02 DIAGNOSIS — Z85828 Personal history of other malignant neoplasm of skin: Secondary | ICD-10-CM

## 2017-02-02 DIAGNOSIS — E7800 Pure hypercholesterolemia, unspecified: Secondary | ICD-10-CM | POA: Diagnosis not present

## 2017-02-02 DIAGNOSIS — I10 Essential (primary) hypertension: Secondary | ICD-10-CM

## 2017-02-02 DIAGNOSIS — M8580 Other specified disorders of bone density and structure, unspecified site: Secondary | ICD-10-CM | POA: Diagnosis not present

## 2017-02-02 DIAGNOSIS — R053 Chronic cough: Secondary | ICD-10-CM

## 2017-02-02 NOTE — Assessment & Plan Note (Signed)
Reviewed oncology history  "(1) status post left lumpectomy and sentinel lymph node sampling January 2012 for a pT1c pN1, stage IIB  invasive ductal carcinoma, grade 2,  strongly estrogen and progesterone receptor positive, HER2 negative, MIB-1 of 44%, (2) treated adjuvantly with docetaxel and cyclophosphamide x4 completed May 2012  (3) radiation completed August 2012.  (4) began on tamoxifen in August 2012, switched to anastrozole 10/06/2013. Through august 2017"  Needs to continue yearly mammograms and we will update breast exam at CPE

## 2017-02-02 NOTE — Patient Instructions (Addendum)
Schedule a lab visit at the check out desk within a week of your physical (labs ordered today). Return for future fasting labs meaning nothing but water after midnight please. Ok to take your medications with water.   Please go to [facility] (given chronic intermittent cough for several years) - located [address] across the [address] from [place] - in the basement - Hours: 8:30-5:30 PM M-F. Do not need appointment.

## 2017-02-02 NOTE — Assessment & Plan Note (Signed)
S: poorly controlled on no rx per Dr Sherren Mocha due to high HDL. No myalgias.  Lab Results  Component Value Date   CHOL 215 (H) 01/21/2016   HDL 58.80 01/21/2016   LDLCALC 134 (H) 01/21/2016   LDLDIRECT 141.7 11/08/2013   TRIG 108.0 01/21/2016   CHOLHDL 4 01/21/2016   A/P: we discussed updating lipids and calculating 10 year risk of heart attack and stroke

## 2017-02-02 NOTE — Assessment & Plan Note (Signed)
From history "(5) DEXA scan 08/31/2013 shows osteopenia with a T score of -2.0; zolendronate started April 2015, to be repeated every 6 months while on anastrozole."  2016 dexa stable. Repeat 2019 we agreed to.

## 2017-02-02 NOTE — Progress Notes (Signed)
Phone: [phone]  Subjective:  Patient presents today to establish care with me as their new primary care provider. Patient was formerly a patient of Dr. Sherren Mocha. Chief complaint-noted.   See problem oriented charting ROS- No chest pain or shortness of breath. No headache or blurry vision. Does get some burning in throat at times after meals resolved by tums  The following were reviewed and entered/updated in epic: Past Medical History:  Diagnosis Date  . Cancer [facility])    breast cancer- left  . Heart murmur    years ago. no recent issues  . Hyperlipidemia   . Hypertension    Patient Active Problem List   Diagnosis Date Noted  . History of breast cancer 06/30/2013    Priority: High  . Hyperlipidemia 02/02/2017    Priority: Medium  . Hyperglycemia 03/19/2015    Priority: Medium  . Osteopenia 01/20/2014    Priority: Medium  . Essential hypertension 08/03/2008    Priority: Medium  . SKIN CANCER, HX OF 08/03/2008    Priority: Low   Past Surgical History:  Procedure Laterality Date  . ABDOMINAL HYSTERECTOMY  1987   fibroids  . BREAST LUMPECTOMY  10/23/10   left  . PORT-A-CATH REMOVAL  09/02/2011   Procedure: REMOVAL PORT-A-CATH;  Surgeon: Adin Hector, MD;  Location: [place];  Service: General;  Laterality: Right;    Family History  Problem Relation Age of Onset  . COPD Father   . Diabetes Mother   . Dementia Mother   . Kidney disease Mother     not on dialysis  . Cancer Brother     jaw  . Hypertension Brother    Medications- reviewed and updated Current Outpatient Prescriptions  Medication Sig Dispense Refill  . aspirin 81 MG tablet Take 81 mg by mouth daily.    Marland Kitchen CALCIUM PO Take by mouth.      . Cholecalciferol (D-3-5 PO) Take 1,000 Units by mouth daily.     Marland Kitchen lisinopril-hydrochlorothiazide (PRINZIDE,ZESTORETIC) 20-12.5 MG tablet TAKE ONE-HALF TABLET BY MOUTH IN THE MORNING 50 tablet 3  . MAGNESIUM PO Take 250 mg by mouth daily.     .  Multiple Vitamin (MULTIVITAMIN) tablet Take 1 tablet by mouth daily.      . potassium chloride (K-DUR) 10 MEQ tablet Take 1 tablet (10 mEq total) by mouth daily. 100 tablet 3  . pyridoxine (B-6) 100 MG tablet Take 100 mg by mouth daily.    . timolol (TIMOPTIC) 0.5 % ophthalmic solution BID times 48H.     No current facility-administered medications for this visit.     Allergies-reviewed and updated Allergies  Allergen Reactions  . Peanut-Containing Drug Products     All nuts cause fever blisters. *Pecans.      Social History   Social History  . Marital status: Married    Spouse name: N/A  . Number of children: N/A  . Years of education: N/A   Social History Main Topics  . Smoking status: Never Smoker  . Smokeless tobacco: Never Used  . Alcohol use No  . Drug use: No  . Sexual activity: Not Asked   Other Topics Concern  . None   Social History Narrative   Married. Lives with husband. 3 children. 3 grandkids (22, 22, 18 in 2018).       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       Hobbies: working puzzles, reading, shoping, beach    Objective: BP 134/70 (BP Location:  Left Arm, Patient Position: Sitting, Cuff Size: Large)   Pulse 61   Temp 98.1 F (36.7 C) (Oral)   Ht 5' 0.5" (1.537 m)   Wt 174 lb (78.9 kg)   SpO2 95%   BMI 33.42 kg/m  Gen: NAD, resting comfortably HEENT: Mucous membranes are moist. Oropharynx normal CV: RRR no murmurs rubs or gallops Lungs: CTAB no crackles, wheeze, rhonchi Abdomen: soft/nontender/nondistended/normal bowel sounds. obese Ext: no edema Skin: warm, dry Neuro: grossly normal, moves all extremities, PERRLA. Walks without assist.   Assessment/Plan:  Essential hypertension S: controlled on Lisinopril hctz 20-12.35m (half tablet only).  BP Readings from Last 3 Encounters:  02/02/17 134/70  01/31/16 (!) 155/64  01/28/16 120/78  A/P:Continue current meds   Hyperglycemia S: every so often checks sugars at home  ranging from 90s to low 100s Lab Results  Component Value Date   HGBA1C 6.2 (H) 03/20/2015  A/P: Encouraged need for healthy eating, regular exercise, weight loss. Used to be over 200- she is trying to increase her motivation to lose weight. Had been walking but took a few weeks off- needs to restart. a1c next visit  Hyperlipidemia S: poorly controlled on no rx per Dr TSherren Mochadue to high HDL. No myalgias.  Lab Results  Component Value Date   CHOL 215 (H) 01/21/2016   HDL 58.80 01/21/2016   LDLCALC 134 (H) 01/21/2016   LDLDIRECT 141.7 11/08/2013   TRIG 108.0 01/21/2016   CHOLHDL 4 01/21/2016   A/P: we discussed updating lipids and calculating 10 year risk of heart attack and stroke  History of breast cancer Reviewed oncology history  "(1) status post left lumpectomy and sentinel lymph node sampling January 2012 for a pT1c pN1, stage IIB  invasive ductal carcinoma, grade 2,  strongly estrogen and progesterone receptor positive, HER2 negative, MIB-1 of 44%, (2) treated adjuvantly with docetaxel and cyclophosphamide x4 completed May 2012  (3) radiation completed August 2012.  (4) began on tamoxifen in August 2012, switched to anastrozole 10/06/2013. Through august 2017"  Needs to continue yearly mammograms and we will update breast exam at CPE  Osteopenia From history "(5) DEXA scan 08/31/2013 shows osteopenia with a T score of -2.0; zolendronate started April 2015, to be repeated every 6 months while on anastrozole."  2016 dexa stable. Repeat 2019 we agreed to.   Chronic intermittent cough since breast cancer- will get chest x-ray   Return in about 3 months (around 05/04/2017) for physical.  Orders Placed This Encounter  Procedures  . DG Chest 2 View    Standing Status:   Future    Standing Expiration Date:   04/04/2018    Order Specific Question:   Reason for Exam (SYMPTOM  OR DIAGNOSIS REQUIRED)    Answer:   chronic intermittent cough since breast cancer in 2012    Order Specific  Question:   Preferred imaging location?    Answer:   [facility] CBC    Standing Status:   Future    Standing Expiration Date:   02/02/2018  . Comprehensive metabolic panel    [place]    Standing Status:   Future    Standing Expiration Date:   02/02/2018  . Lipid panel    Standing Status:   Future    Standing Expiration Date:   02/02/2018  . Hemoglobin A1c    [place]    Standing Status:   Future    Standing Expiration Date:   02/02/2018   Return precautions advised.  Garret Reddish, MD

## 2017-02-02 NOTE — Progress Notes (Signed)
Pre visit review using our clinic review tool, if applicable. No additional management support is needed unless otherwise documented below in the visit note. 

## 2017-02-02 NOTE — Assessment & Plan Note (Signed)
S: controlled on Lisinopril hctz 20-12.5mg  (half tablet only).  BP Readings from Last 3 Encounters:  02/02/17 134/70  01/31/16 (!) 155/64  01/28/16 120/78  A/P:Continue current meds

## 2017-02-02 NOTE — Assessment & Plan Note (Signed)
S: every so often checks sugars at home ranging from 90s to low 100s Lab Results  Component Value Date   HGBA1C 6.2 (H) 03/20/2015  A/P: Encouraged need for healthy eating, regular exercise, weight loss. Used to be over 200- she is trying to increase her motivation to lose weight. Had been walking but took a few weeks off- needs to restart. a1c next visit

## 2017-03-03 ENCOUNTER — Ambulatory Visit (INDEPENDENT_AMBULATORY_CARE_PROVIDER_SITE_OTHER)
Admission: RE | Admit: 2017-03-03 | Discharge: 2017-03-03 | Disposition: A | Discharge status: Home / Self Care | Payer: Medicare HMO | Source: Ambulatory Visit | Attending: Family Medicine | Admitting: Family Medicine

## 2017-03-03 DIAGNOSIS — R053 Chronic cough: Secondary | ICD-10-CM

## 2017-03-03 DIAGNOSIS — R05 Cough: Secondary | ICD-10-CM | POA: Diagnosis not present

## 2017-03-28 ENCOUNTER — Encounter (HOSPITAL_COMMUNITY): Payer: Self-pay

## 2017-03-28 ENCOUNTER — Observation Stay (HOSPITAL_COMMUNITY): Payer: Medicare HMO

## 2017-03-28 ENCOUNTER — Inpatient Hospital Stay (HOSPITAL_COMMUNITY)
Admission: EM | Admit: 2017-03-28 | Discharge: 2017-04-06 | DRG: 982 | Disposition: A | Discharge status: Home Health | Payer: Medicare HMO | Attending: Internal Medicine | Admitting: Internal Medicine

## 2017-03-28 DIAGNOSIS — M009 Pyogenic arthritis, unspecified: Secondary | ICD-10-CM | POA: Diagnosis present

## 2017-03-28 DIAGNOSIS — E785 Hyperlipidemia, unspecified: Secondary | ICD-10-CM | POA: Diagnosis present

## 2017-03-28 DIAGNOSIS — Z853 Personal history of malignant neoplasm of breast: Secondary | ICD-10-CM | POA: Diagnosis not present

## 2017-03-28 DIAGNOSIS — K5900 Constipation, unspecified: Secondary | ICD-10-CM | POA: Diagnosis present

## 2017-03-28 DIAGNOSIS — M25559 Pain in unspecified hip: Secondary | ICD-10-CM

## 2017-03-28 DIAGNOSIS — N61 Mastitis without abscess: Principal | ICD-10-CM | POA: Diagnosis present

## 2017-03-28 DIAGNOSIS — M25452 Effusion, left hip: Secondary | ICD-10-CM | POA: Diagnosis not present

## 2017-03-28 DIAGNOSIS — Z79899 Other long term (current) drug therapy: Secondary | ICD-10-CM

## 2017-03-28 DIAGNOSIS — M25552 Pain in left hip: Secondary | ICD-10-CM

## 2017-03-28 DIAGNOSIS — M00052 Staphylococcal arthritis, left hip: Secondary | ICD-10-CM | POA: Diagnosis not present

## 2017-03-28 DIAGNOSIS — I10 Essential (primary) hypertension: Secondary | ICD-10-CM | POA: Diagnosis not present

## 2017-03-28 DIAGNOSIS — D696 Thrombocytopenia, unspecified: Secondary | ICD-10-CM | POA: Diagnosis not present

## 2017-03-28 DIAGNOSIS — M00852 Arthritis due to other bacteria, left hip: Secondary | ICD-10-CM | POA: Diagnosis not present

## 2017-03-28 DIAGNOSIS — R112 Nausea with vomiting, unspecified: Secondary | ICD-10-CM | POA: Diagnosis present

## 2017-03-28 DIAGNOSIS — Z7982 Long term (current) use of aspirin: Secondary | ICD-10-CM

## 2017-03-28 DIAGNOSIS — M2540 Effusion, unspecified joint: Secondary | ICD-10-CM

## 2017-03-28 LAB — BASIC METABOLIC PANEL
ANION GAP: 9 (ref 5–15)
BUN: 24 mg/dL — ABNORMAL HIGH (ref 6–20)
CO2: 25 mmol/L (ref 22–32)
Calcium: 9 mg/dL (ref 8.9–10.3)
Chloride: 102 mmol/L (ref 101–111)
Creatinine, Ser: 0.92 mg/dL (ref 0.44–1.00)
GFR calc Af Amer: >60 mL/min (ref >60)
GFR, EST NON AFRICAN AMERICAN: 58 mL/min — AB (ref >60)
GLUCOSE: 144 mg/dL — AB (ref 65–99)
Potassium: 4.2 mmol/L (ref 3.5–5.1)
Sodium: 136 mmol/L (ref 135–145)

## 2017-03-28 LAB — CBC WITH DIFFERENTIAL/PLATELET
BASOS ABS: 0 10*3/uL (ref 0.0–0.1)
Basophils Relative: 0 %
Eosinophils Absolute: 0 10*3/uL (ref 0.0–0.7)
Eosinophils Relative: 0 %
HEMATOCRIT: 36.6 % (ref 36.0–46.0)
Hemoglobin: 12.2 g/dL (ref 12.0–15.0)
LYMPHS PCT: 4 %
Lymphs Abs: 0.7 10*3/uL (ref 0.7–4.0)
MCH: 30.4 pg (ref 26.0–34.0)
MCHC: 33.3 g/dL (ref 30.0–36.0)
MCV: 91.3 fL (ref 78.0–100.0)
Monocytes Absolute: 0.6 10*3/uL (ref 0.1–1.0)
Monocytes Relative: 3 %
NEUTROS ABS: 16.1 10*3/uL — AB (ref 1.7–7.7)
Neutrophils Relative %: 93 %
Platelets: 172 10*3/uL (ref 150–400)
RBC: 4.01 MIL/uL (ref 3.87–5.11)
RDW: 13.4 % (ref 11.5–15.5)
WBC: 17.4 10*3/uL — AB (ref 4.0–10.5)

## 2017-03-28 LAB — D-DIMER, QUANTITATIVE: D-Dimer, Quant: <0.27 ug/mL-FEU (ref 0.00–0.50)

## 2017-03-28 LAB — TSH: TSH: 1.104 u[IU]/mL (ref 0.350–4.500)

## 2017-03-28 LAB — PROTIME-INR
INR: 0.98
Prothrombin Time: 12.9 seconds (ref 11.4–15.2)

## 2017-03-28 MED ORDER — HYDROCODONE-ACETAMINOPHEN 5-325 MG PO TABS
1.0000 | ORAL_TABLET | ORAL | Status: DC | PRN
  Administered 2017-03-28 (×2): 1 via ORAL
  Administered 2017-03-28 – 2017-03-30 (×10): 2 via ORAL
  Administered 2017-03-31: 1 via ORAL
  Administered 2017-04-01: 2 via ORAL
  Administered 2017-04-01: 1 via ORAL
  Administered 2017-04-01 – 2017-04-04 (×5): 2 via ORAL
  Administered 2017-04-05: 1 via ORAL
  Administered 2017-04-05: 2 via ORAL
  Administered 2017-04-05: 1 via ORAL
  Administered 2017-04-06: 2 via ORAL
  Filled 2017-03-28 (×3): qty 2
  Filled 2017-03-28: qty 1
  Filled 2017-03-28 (×5): qty 2
  Filled 2017-03-28: qty 1
  Filled 2017-03-28 (×5): qty 2
  Filled 2017-03-28: qty 1
  Filled 2017-03-28 (×2): qty 2
  Filled 2017-03-28 (×2): qty 1
  Filled 2017-03-28: qty 2
  Filled 2017-03-28: qty 1
  Filled 2017-03-28: qty 2
  Filled 2017-03-28 (×2): qty 1
  Filled 2017-03-28 (×3): qty 2

## 2017-03-28 MED ORDER — ACETAMINOPHEN 650 MG RE SUPP: 650.0000 mg | Freq: Four times a day (QID) | RECTAL | Status: DC | PRN

## 2017-03-28 MED ORDER — TIMOLOL MALEATE 0.5 % OP SOLN
1.0000 [drp] | Freq: Two times a day (BID) | OPHTHALMIC | Status: DC
  Administered 2017-03-28 – 2017-04-06 (×19): 1 [drp] via OPHTHALMIC
  Filled 2017-03-28: qty 5

## 2017-03-28 MED ORDER — VITAMIN B-6 100 MG PO TABS
100.0000 mg | ORAL_TABLET | Freq: Every day | ORAL | Status: DC
  Administered 2017-03-28 – 2017-04-06 (×9): 100 mg via ORAL
  Filled 2017-03-28 (×10): qty 1

## 2017-03-28 MED ORDER — ONDANSETRON HCL 4 MG PO TABS
4.0000 mg | ORAL_TABLET | Freq: Four times a day (QID) | ORAL | Status: DC | PRN
  Filled 2017-03-28 (×2): qty 1

## 2017-03-28 MED ORDER — ACETAMINOPHEN 500 MG PO TABS
1000.0000 mg | ORAL_TABLET | Freq: Once | ORAL | Status: AC
  Administered 2017-03-28: 1000 mg via ORAL
  Filled 2017-03-28: qty 2

## 2017-03-28 MED ORDER — ADULT MULTIVITAMIN W/MINERALS CH
1.0000 | ORAL_TABLET | Freq: Every day | ORAL | Status: DC
  Administered 2017-03-28 – 2017-03-29 (×2): 1 via ORAL
  Filled 2017-03-28 (×4): qty 1

## 2017-03-28 MED ORDER — ONDANSETRON HCL 4 MG/2ML IJ SOLN
4.0000 mg | Freq: Once | INTRAMUSCULAR | Status: AC
  Administered 2017-03-28: 4 mg via INTRAVENOUS
  Filled 2017-03-28: qty 2

## 2017-03-28 MED ORDER — ACETAMINOPHEN 325 MG PO TABS
650.0000 mg | ORAL_TABLET | Freq: Four times a day (QID) | ORAL | Status: DC | PRN
  Administered 2017-03-30 – 2017-03-31 (×3): 650 mg via ORAL
  Filled 2017-03-28 (×3): qty 2

## 2017-03-28 MED ORDER — LISINOPRIL 10 MG PO TABS
10.0000 mg | ORAL_TABLET | Freq: Every day | ORAL | Status: DC
  Administered 2017-03-31 – 2017-04-06 (×6): 10 mg via ORAL
  Filled 2017-03-28 (×9): qty 1

## 2017-03-28 MED ORDER — SODIUM CHLORIDE 0.9 % IV BOLUS (SEPSIS)
1000.0000 mL | Freq: Once | INTRAVENOUS | Status: AC
  Administered 2017-03-28: 1000 mL via INTRAVENOUS

## 2017-03-28 MED ORDER — SODIUM CHLORIDE 0.9 % IV SOLN
INTRAVENOUS | Status: AC
  Administered 2017-03-28: 13:00:00 via INTRAVENOUS

## 2017-03-28 MED ORDER — ONDANSETRON HCL 4 MG/2ML IJ SOLN: 4.0000 mg | Freq: Four times a day (QID) | INTRAMUSCULAR | Status: DC | PRN

## 2017-03-28 MED ORDER — ASPIRIN EC 81 MG PO TBEC
81.0000 mg | DELAYED_RELEASE_TABLET | Freq: Every day | ORAL | Status: DC
  Administered 2017-03-28 – 2017-04-02 (×6): 81 mg via ORAL
  Filled 2017-03-28 (×6): qty 1

## 2017-03-28 MED ORDER — HYDROCHLOROTHIAZIDE 10 MG/ML ORAL SUSPENSION
6.2500 mg | Freq: Every day | ORAL | Status: DC
  Administered 2017-03-31 – 2017-04-05 (×5): 6.25 mg via ORAL
  Filled 2017-03-28 (×9): qty 1.25

## 2017-03-28 MED ORDER — HEPARIN SODIUM (PORCINE) 5000 UNIT/ML IJ SOLN
5000.0000 [IU] | Freq: Three times a day (TID) | INTRAMUSCULAR | Status: DC
  Administered 2017-03-28 – 2017-04-02 (×15): 5000 [IU] via SUBCUTANEOUS
  Filled 2017-03-28 (×15): qty 1

## 2017-03-28 MED ORDER — LISINOPRIL-HYDROCHLOROTHIAZIDE 20-12.5 MG PO TABS: 1.0000 | ORAL_TABLET | Freq: Every day | ORAL | Status: DC

## 2017-03-28 MED ORDER — SODIUM CHLORIDE 0.9 % IV SOLN
1250.0000 mg | INTRAVENOUS | Status: DC
  Administered 2017-03-28 – 2017-03-31 (×4): 1250 mg via INTRAVENOUS
  Filled 2017-03-28 (×4): qty 1250

## 2017-03-28 NOTE — H&P (Signed)
History and Physical    Briana Smith OVZ:[id number] DOB: 23-Nov-1940 DOA: 03/28/2017  PCP: [name], MD  Patient coming from: Home  Chief Complaint: "My breast is red as a beet"  HPI: Briana Smith is a 76 y.o. female with medical history significant of left breast cancer status post lumpectomy and chemoradiation, history of left breast cellulitis about 2 years ago and hypertension. Patient reported she developed fever, sweating and breast soreness about 1 AM this morning, when she checked her breast it was very erythematous and tender so she came to the hospital for further evaluation.  ED Course:  Vitals: WNL Labs: Leukocytosis Imaging: [place] pending Interventions:   Review of Systems:  Constitutional: Oriented fevers and sweats Eyes: negative for irritation, redness and visual disturbance Ears, nose, mouth, throat, and face: negative for earaches, epistaxis, nasal congestion and sore throat Respiratory: negative for cough, dyspnea on exertion, sputum and wheezing Cardiovascular: negative for chest pain, dyspnea, lower extremity edema, orthopnea, palpitations and syncope Gastrointestinal: negative for abdominal pain, constipation, diarrhea, melena, nausea and vomiting Genitourinary:negative for dysuria, frequency and hematuria Hematologic/lymphatic: negative for bleeding, easy bruising and lymphadenopathy Musculoskeletal: Redness and soreness of the left breast Neurological: negative for coordination problems, gait problems, headaches and weakness Endocrine: negative for diabetic symptoms including polydipsia, polyuria and weight loss Allergic/Immunologic: negative for anaphylaxis, hay fever and urticaria  Past Medical History:  Diagnosis Date  . Cancer [facility])    breast cancer- left  . Heart murmur    years ago. no recent issues  . Hyperlipidemia   . Hypertension     Past Surgical History:  Procedure Laterality Date  . ABDOMINAL HYSTERECTOMY  1987   fibroids    . BREAST LUMPECTOMY  10/23/10   left  . PORT-A-CATH REMOVAL  09/02/2011   Procedure: REMOVAL PORT-A-CATH;  Surgeon: Adin Hector, MD;  Location: [place];  Service: General;  Laterality: Right;     reports that she has never smoked. She has never used smokeless tobacco. She reports that she does not drink alcohol or use drugs.  No Known Allergies  Family History  Problem Relation Age of Onset  . COPD Father   . Diabetes Mother   . Dementia Mother   . Kidney disease Mother        not on dialysis  . Cancer Brother        jaw  . Hypertension Brother     Prior to Admission medications   Medication Sig Start Date End Date Taking? Authorizing Provider  aspirin 81 MG tablet Take 81 mg by mouth daily.   Yes Dorena Cookey, MD  CYANOCOBALAMIN PO Take 1 tablet by mouth daily.   Yes [provider]  lisinopril-hydrochlorothiazide (PRINZIDE,ZESTORETIC) 20-12.5 MG tablet TAKE ONE-HALF TABLET BY MOUTH IN THE MORNING 01/28/16  Yes Dorena Cookey, MD  Multiple Vitamin (MULTIVITAMIN) tablet Take 1 tablet by mouth daily.     Yes [provider]  pyridoxine (B-6) 100 MG tablet Take 100 mg by mouth daily.   Yes [provider]  timolol (TIMOPTIC) 0.5 % ophthalmic solution BID times 48H. 06/11/11  Yes [provider]  potassium chloride (K-DUR) 10 MEQ tablet Take 1 tablet (10 mEq total) by mouth daily. Patient not taking: Reported on 03/28/2017 01/28/16   Dorena Cookey, MD    Physical Exam:  Vitals:   03/28/17 0742 03/28/17 0743  BP: (!) 141/69   Pulse: 87   Resp: 18   Temp: 98.2  F (36.8 C)   TempSrc: Oral   SpO2: 100%   Weight:  75.8 kg (167 lb)  Height:  5' 0.5" (1.537 m)    Constitutional: NAD, calm, comfortable Eyes: PERRL, lids and conjunctivae normal ENMT: Mucous membranes are moist. Posterior pharynx clear of any exudate or lesions.Normal dentition.  Neck: normal, supple, no masses, no thyromegaly Respiratory: clear to  auscultation bilaterally, no wheezing, no crackles. Normal respiratory effort. No accessory muscle use.  Cardiovascular: Regular rate and rhythm, no murmurs / rubs / gallops. No extremity edema. 2+ pedal pulses. No carotid bruits.  Abdomen: no tenderness, no masses palpated. No hepatosplenomegaly. Bowel sounds positive.  Musculoskeletal: no clubbing / cyanosis. No joint deformity upper and lower extremities. Good ROM, no contractures. Normal muscle tone.  Skin: no rashes, lesions, ulcers. No induration Neurologic: CN 2-12 grossly intact. Sensation intact, DTR normal. Strength 5/5 in all 4.  Psychiatric: Normal judgment and insight. Alert and oriented x 3. Normal mood.   Labs on Admission: I have personally reviewed following labs and imaging studies  CBC:  Recent Labs Lab 03/28/17 0831  WBC 17.4*  NEUTROABS 16.1*  HGB 12.2  HCT 36.6  MCV 91.3  PLT 176   Basic Metabolic Panel:  Recent Labs Lab 03/28/17 0831  NA 136  K 4.2  CL 102  CO2 25  GLUCOSE 144*  BUN 24*  CREATININE 0.92  CALCIUM 9.0   GFR: Estimated Creatinine Clearance: 46.4 mL/min (by C-G formula based on SCr of 0.92 mg/dL). Liver Function Tests: No results for input(s): AST, ALT, ALKPHOS, BILITOT, PROT, ALBUMIN in the last 168 hours. No results for input(s): LIPASE, AMYLASE in the last 168 hours. No results for input(s): AMMONIA in the last 168 hours. Coagulation Profile: No results for input(s): INR, PROTIME in the last 168 hours. Cardiac Enzymes: No results for input(s): CKTOTAL, CKMB, CKMBINDEX, TROPONINI in the last 168 hours. BNP (last 3 results) No results for input(s): PROBNP in the last 8760 hours. HbA1C: No results for input(s): HGBA1C in the last 72 hours. CBG: No results for input(s): GLUCAP in the last 168 hours. Lipid Profile: No results for input(s): CHOL, HDL, LDLCALC, TRIG, CHOLHDL, LDLDIRECT in the last 72 hours. Thyroid Function Tests: No results for input(s): TSH, T4TOTAL, FREET4,  T3FREE, THYROIDAB in the last 72 hours. Anemia Panel: No results for input(s): VITAMINB12, FOLATE, FERRITIN, TIBC, IRON, RETICCTPCT in the last 72 hours. Urine analysis:    Component Value Date/Time   COLORURINE YELLOW 10/22/2010 1100   APPEARANCEUR CLEAR 10/22/2010 1100   LABSPEC 1.016 10/22/2010 1100   PHURINE 7.5 10/22/2010 1100   HGBUR NEGATIVE 10/22/2010 1100   HGBUR trace-intact 09/03/2010 0831   BILIRUBINUR n 01/21/2016 1017   KETONESUR negative 11/09/2014 1019   KETONESUR NEGATIVE 10/22/2010 1100   PROTEINUR 1+ 01/21/2016 1017   PROTEINUR NEGATIVE 10/22/2010 1100   UROBILINOGEN 0.2 01/21/2016 1017   UROBILINOGEN 0.2 10/22/2010 1100   NITRITE n 01/21/2016 1017   NITRITE NEGATIVE 10/22/2010 1100   LEUKOCYTESUR Trace (A) 01/21/2016 1017   Sepsis Labs: !!!!!!!!!!!!!!!!!!!!!!!!!!!!!!!!!!!!!!!!!!!! Invalid input(s): PROCALCITONIN, LACTICIDVEN No results found for this or any previous visit (from the past 240 hour(s)).   Radiological Exams on Admission: No results found.  EKG: Independently reviewed.   Assessment/Plan Principal Problem:   Cellulitis of left breast Active Problems:   Essential hypertension   History of breast cancer    Cellulitis of the left breast -Presented with subjective fever, redness of the left breast and the WBCs of 17.4. -Started  on vancomycin. -Obtain ultrasound to rule out any abscess or drainable pocket of fluids.  History of breast cancer -Status post lumpectomy, chemotherapy and radiation.  Essential hypertension -She is on lisinopril and HCTZ. Continued  Nausea and vomiting -She reported she vomited 3 times during nighttime, this is resolved by now. -Treated symptomatically, this is likely systemic symptom of her cellulitis.  DVT prophylaxis: SQ Heparin Code Status: Full code Family Communication: Plan D/W patient Disposition Plan: Home Consults called:  Admission status: Observation   Chick Cousins A MD [facility] Pager [phone]  If 7PM-7AM, please contact night-coverage [email or web address] Password [facility]  03/28/2017, 10:17 AM

## 2017-03-28 NOTE — ED Triage Notes (Addendum)
Pt started having redness and tenderness to left breast last night.  Woke up in night with 3 episodes of emesis, chills and sweats.  Pain is in left breast.  Patient had breast cancer in that breast.  Last chemo/radiation was a year ago.

## 2017-03-28 NOTE — Progress Notes (Signed)
Patient reports pain in Left groin to knee which is new since arriving to the unit and new for patient.  Administered 1 Vicodin for pain which did not relieve the pain.  2nd Vicodin administered due to patient reporting no relief.  Notified physician that patient reports no relief from the pain medication. Physician reports let the pain medication take time to be effective.  No other orders at this time.

## 2017-03-28 NOTE — Progress Notes (Signed)
Pharmacy Antibiotic Note  Briana Smith is a 76 y.o. female admitted on 03/28/2017 with cellulitis.  Pharmacy has been consulted for vancomycin dosing for L breast cellulitis. Wt 75.8, WBC elevated at 17.4, creat 0.92, AF. Pt with redness and tenderness to L breast, had emesis x 3, chills and sweats. S/p chemo, XRT to L breast 1 yr ago.   Plan: Vancomycin 1250 mg IV every 24 hours.  Goal trough 10-15 mcg/mL.  F/u renal fxn, wbc, temp, culture data Vancomycin levels as needed  Height: 5' 0.5" (153.7 cm) Weight: 167 lb (75.8 kg) IBW/kg (Calculated) : 46.65  Temp (24hrs), Avg:98.2 F (36.8 C), Min:98.2 F (36.8 C), Max:98.2 F (36.8 C)   Recent Labs Lab 03/28/17 0831  WBC 17.4*  CREATININE 0.92    Estimated Creatinine Clearance: 46.4 mL/min (by C-G formula based on SCr of 0.92 mg/dL).    No Known Allergies  Thank you for allowing pharmacy to be a part of this patient's care.  Eudelia Bunch, Pharm.D. [phone] 03/28/2017 9:32 AM

## 2017-03-28 NOTE — ED Notes (Signed)
Attempted to call floor several times, no answer.  Contacted 5W and secretary stated to keep calling, someone was on the floor.

## 2017-03-28 NOTE — ED Provider Notes (Signed)
[place] DEPT Provider Note   CSN: [id number] Arrival date & time: 03/28/17  0734     History   Chief Complaint Chief Complaint  Patient presents with  . Breast Pain  . Emesis    HPI Briana CANADY is a 76 y.o. female.  Left breast redness and tenderness since last evening with associated sweats and chills. Patient had a similar episode in the left breast 2 years ago with a diagnosis of cellulitis and a hospital admission related to same. She has had cancer in that breast several years ago with a lumpectomy, radiation, chemotherapy. Severity of symptoms is moderate.      Past Medical History:  Diagnosis Date  . Cancer [facility])    breast cancer- left  . Heart murmur    years ago. no recent issues  . Hyperlipidemia   . Hypertension     Patient Active Problem List   Diagnosis Date Noted  . Hyperlipidemia 02/02/2017  . Hyperglycemia 03/19/2015  . Osteopenia 01/20/2014  . History of breast cancer 06/30/2013  . Essential hypertension 08/03/2008  . SKIN CANCER, HX OF 08/03/2008    Past Surgical History:  Procedure Laterality Date  . ABDOMINAL HYSTERECTOMY  1987   fibroids  . BREAST LUMPECTOMY  10/23/10   left  . PORT-A-CATH REMOVAL  09/02/2011   Procedure: REMOVAL PORT-A-CATH;  Surgeon: Adin Hector, MD;  Location: [place];  Service: General;  Laterality: Right;    OB History    No data available       Home Medications    Prior to Admission medications   Medication Sig Start Date End Date Taking? Authorizing Provider  aspirin 81 MG tablet Take 81 mg by mouth daily.   Yes Dorena Cookey, MD  CYANOCOBALAMIN PO Take 1 tablet by mouth daily.   Yes [provider]  lisinopril-hydrochlorothiazide (PRINZIDE,ZESTORETIC) 20-12.5 MG tablet TAKE ONE-HALF TABLET BY MOUTH IN THE MORNING 01/28/16  Yes Dorena Cookey, MD  Multiple Vitamin (MULTIVITAMIN) tablet Take 1 tablet by mouth daily.     Yes [provider]  pyridoxine  (B-6) 100 MG tablet Take 100 mg by mouth daily.   Yes [provider]  timolol (TIMOPTIC) 0.5 % ophthalmic solution BID times 48H. 06/11/11  Yes [provider]  potassium chloride (K-DUR) 10 MEQ tablet Take 1 tablet (10 mEq total) by mouth daily. Patient not taking: Reported on 03/28/2017 01/28/16   Dorena Cookey, MD    Family History Family History  Problem Relation Age of Onset  . COPD Father   . Diabetes Mother   . Dementia Mother   . Kidney disease Mother        not on dialysis  . Cancer Brother        jaw  . Hypertension Brother     Social History Social History  Substance Use Topics  . Smoking status: Never Smoker  . Smokeless tobacco: Never Used  . Alcohol use No     Allergies   Patient has no known allergies.   Review of Systems Review of Systems  All other systems reviewed and are negative.    Physical Exam Updated Vital Signs BP (!) 141/69 (BP Location: Left Arm)   Pulse 87   Temp 98.2 F (36.8 C) (Oral)   Resp 18   Ht 5' 0.5" (1.537 m)   Wt 75.8 kg (167 lb)   SpO2 100%   BMI 32.08 kg/m   Physical Exam  Constitutional: She is  oriented to person, place, and time. She appears well-developed and well-nourished.  HENT:  Head: Normocephalic and atraumatic.  Eyes: Conjunctivae are normal.  Neck: Neck supple.  Cardiovascular: Normal rate and regular rhythm.   Pulmonary/Chest: Effort normal and breath sounds normal.  Abdominal: Soft. Bowel sounds are normal.  Musculoskeletal: Normal range of motion.  Neurological: She is alert and oriented to person, place, and time.  Skin:  Left breast: Diffuse macular erythema in a blotch like pattern. Tender to palpation  Psychiatric: She has a normal mood and affect. Her behavior is normal.  Nursing note and vitals reviewed.    ED Treatments / Results  Labs (all labs ordered are listed, but only abnormal results are displayed) Labs Reviewed  CBC WITH DIFFERENTIAL/PLATELET - Abnormal;  Notable for the following:       Result Value   WBC 17.4 (*)    Neutro Abs 16.1 (*)    All other components within normal limits  BASIC METABOLIC PANEL    EKG  EKG Interpretation None       Radiology No results found.  Procedures Procedures (including critical care time)  Medications Ordered in ED Medications  ondansetron (ZOFRAN) injection 4 mg (4 mg Intravenous Given 03/28/17 0844)  sodium chloride 0.9 % bolus 1,000 mL (1,000 mLs Intravenous New Bag/Given 03/28/17 0844)  acetaminophen (TYLENOL) tablet 1,000 mg (1,000 mg Oral Given 03/28/17 0844)     Initial Impression / Assessment and Plan / ED Course  I have reviewed the triage vital signs and the nursing notes.  Pertinent labs & imaging results that were available during my care of the patient were reviewed by me and considered in my medical decision making (see chart for details).     Patient presents with left breast erythema and tenderness consistent with cellulitis. White count 17.4 K. Rx IV vancomycin. Admit to general medicine.  Final Clinical Impressions(s) / ED Diagnoses   Final diagnoses:  Cellulitis of left breast    New Prescriptions New Prescriptions   No medications on file     Nat Christen, MD 03/28/17 [phone]

## 2017-03-28 NOTE — Progress Notes (Signed)
Notified physician that patient reports constant pain from Left groin to knee which she states began after arriving to the unit and in bed.  Patient unable to put total weight on Left leg when ambulating to bathroom.  Notified physician about my concern for possible DVT.  Patient has no redness or swelling to the area of the left groin to knee.  Physician orders D Dimer.

## 2017-03-28 NOTE — ED Notes (Signed)
Report given to Teresa, RN

## 2017-03-29 ENCOUNTER — Observation Stay (HOSPITAL_COMMUNITY): Payer: Medicare HMO

## 2017-03-29 ENCOUNTER — Observation Stay (HOSPITAL_BASED_OUTPATIENT_CLINIC_OR_DEPARTMENT_OTHER): Payer: Medicare HMO

## 2017-03-29 DIAGNOSIS — M25552 Pain in left hip: Secondary | ICD-10-CM | POA: Diagnosis not present

## 2017-03-29 DIAGNOSIS — N61 Mastitis without abscess: Secondary | ICD-10-CM | POA: Insufficient documentation

## 2017-03-29 DIAGNOSIS — M7989 Other specified soft tissue disorders: Secondary | ICD-10-CM | POA: Diagnosis not present

## 2017-03-29 DIAGNOSIS — Z7982 Long term (current) use of aspirin: Secondary | ICD-10-CM | POA: Diagnosis not present

## 2017-03-29 DIAGNOSIS — I10 Essential (primary) hypertension: Secondary | ICD-10-CM | POA: Diagnosis not present

## 2017-03-29 DIAGNOSIS — K5900 Constipation, unspecified: Secondary | ICD-10-CM | POA: Diagnosis not present

## 2017-03-29 DIAGNOSIS — M8580 Other specified disorders of bone density and structure, unspecified site: Secondary | ICD-10-CM | POA: Diagnosis not present

## 2017-03-29 DIAGNOSIS — M009 Pyogenic arthritis, unspecified: Secondary | ICD-10-CM | POA: Diagnosis not present

## 2017-03-29 DIAGNOSIS — M00052 Staphylococcal arthritis, left hip: Secondary | ICD-10-CM | POA: Diagnosis not present

## 2017-03-29 DIAGNOSIS — M25452 Effusion, left hip: Secondary | ICD-10-CM | POA: Diagnosis not present

## 2017-03-29 DIAGNOSIS — M00852 Arthritis due to other bacteria, left hip: Secondary | ICD-10-CM | POA: Diagnosis not present

## 2017-03-29 DIAGNOSIS — Z79899 Other long term (current) drug therapy: Secondary | ICD-10-CM | POA: Diagnosis not present

## 2017-03-29 DIAGNOSIS — D696 Thrombocytopenia, unspecified: Secondary | ICD-10-CM | POA: Diagnosis not present

## 2017-03-29 DIAGNOSIS — R112 Nausea with vomiting, unspecified: Secondary | ICD-10-CM | POA: Diagnosis not present

## 2017-03-29 DIAGNOSIS — E785 Hyperlipidemia, unspecified: Secondary | ICD-10-CM | POA: Diagnosis not present

## 2017-03-29 DIAGNOSIS — Z853 Personal history of malignant neoplasm of breast: Secondary | ICD-10-CM | POA: Diagnosis not present

## 2017-03-29 LAB — CBC
HEMATOCRIT: 32.3 % — AB (ref 36.0–46.0)
Hemoglobin: 10.5 g/dL — ABNORMAL LOW (ref 12.0–15.0)
MCH: 30.3 pg (ref 26.0–34.0)
MCHC: 32.5 g/dL (ref 30.0–36.0)
MCV: 93.1 fL (ref 78.0–100.0)
PLATELETS: 135 10*3/uL — AB (ref 150–400)
RBC: 3.47 MIL/uL — ABNORMAL LOW (ref 3.87–5.11)
RDW: 14 % (ref 11.5–15.5)
WBC: 9.2 10*3/uL (ref 4.0–10.5)

## 2017-03-29 LAB — BASIC METABOLIC PANEL
Anion gap: 5 (ref 5–15)
BUN: 16 mg/dL (ref 6–20)
CO2: 26 mmol/L (ref 22–32)
Calcium: 8.1 mg/dL — ABNORMAL LOW (ref 8.9–10.3)
Chloride: 105 mmol/L (ref 101–111)
Creatinine, Ser: 0.67 mg/dL (ref 0.44–1.00)
GFR calc Af Amer: >60 mL/min (ref >60)
GFR calc non Af Amer: >60 mL/min (ref >60)
Glucose, Bld: 133 mg/dL — ABNORMAL HIGH (ref 65–99)
Potassium: 3.6 mmol/L (ref 3.5–5.1)
Sodium: 136 mmol/L (ref 135–145)

## 2017-03-29 LAB — HEMOGLOBIN A1C
Hgb A1c MFr Bld: 5.9 % — ABNORMAL HIGH (ref 4.8–5.6)
Mean Plasma Glucose: 123 mg/dL

## 2017-03-29 MED ORDER — DOCUSATE SODIUM 100 MG PO CAPS: 100.0000 mg | ORAL_CAPSULE | Freq: Every day | ORAL | Status: DC | PRN

## 2017-03-29 MED ORDER — CYCLOBENZAPRINE HCL 5 MG PO TABS
5.0000 mg | ORAL_TABLET | Freq: Three times a day (TID) | ORAL | Status: DC | PRN
Start: 2017-03-29 — End: 2017-03-30
  Administered 2017-03-29 – 2017-03-30 (×3): 5 mg via ORAL
  Filled 2017-03-29 (×3): qty 1

## 2017-03-29 MED ORDER — DOCUSATE SODIUM 100 MG PO CAPS
100.0000 mg | ORAL_CAPSULE | Freq: Two times a day (BID) | ORAL | Status: DC
  Administered 2017-03-29 – 2017-04-02 (×9): 100 mg via ORAL
  Filled 2017-03-29 (×9): qty 1

## 2017-03-29 NOTE — Progress Notes (Signed)
VASCULAR LAB PRELIMINARY  PRELIMINARY  PRELIMINARY  PRELIMINARY  Bilateral lower extremity venous duplex completed.    Preliminary report:  Bilateral:  No evidence of DVT, superficial thrombosis, or Baker's Cyst.   Brayln Duque, RVS 03/29/2017, 11:17 AM

## 2017-03-29 NOTE — Progress Notes (Signed)
Patient ID: Briana Smith, female   DOB: 26-Mar-1941, 76 y.o.   MRN: [id number]  PROGRESS NOTE    SKYANNE WELLE  JEH:[id number] DOB: 1941-07-02 DOA: 03/28/2017 PCP: [name], MD   Brief Narrative:  76 year female with history of left breast cancer status post lumpectomy and chemoradiation, left breast cellulitis about 2 years ago and hypertension presented with left breast redness and pain and was admitted with cellulitis on intravenous antibiotics.  Assessment & Plan:   Principal Problem:   Cellulitis of left breast Active Problems:   Essential hypertension   History of breast cancer   Cellulitis of the left breast - Not much improved since admission. -Continue vancomycin. Follow cultures -Breast ultrasound did not show any abscess  Leukocytosis - Probably secondary to cellulitis; resolved  History of breast cancer -Status post lumpectomy, chemotherapy and radiation.  Essential hypertension -She is on lisinopril and HCTZ. Continued - Blood pressure controlled  Nausea and vomiting -She reported she vomited 3 times during nighttime -Treated symptomatically, this is likely systemic symptom of her cellulitis. - Resolved  Left hip and thigh pain, acute - Probably musculoskeletal; use muscle relaxants and analgesics - X-ray of the left hip and lower extremity duplex ultrasound to rule out DVT - PT eval   DVT prophylaxis: SQ Heparin Code Status: Full code Family Communication:  none at bedside  Disposition Plan: Home in 2-3 days  Consultants: None  Procedures: None   Antimicrobials: Vancomycin from 03/28/2017   Subjective: Patient seen and examined at bedside. She complained of left hip and thigh pain yesterday and this morning. Her left breast redness has not much improved since admission. No overnight fever or vomiting.   Objective: Vitals:   03/28/17 1738 03/28/17 2049 03/29/17 0422 03/29/17 1132  BP: (!) 116/55 (!) 111/48 (!) 112/49 113/61    Pulse: 69 69 69 66  Resp:  18 18   Temp:  98.4 F (36.9 C) 98.2 F (36.8 C) 98.6 F (37 C)  TempSrc:  Oral Oral Oral  SpO2:  95% 98% 98%  Weight:      Height:        Intake/Output Summary (Last 24 hours) at 03/29/17 1228 Last data filed at 03/29/17 0745  Gross per 24 hour  Intake              720 ml  Output                0 ml  Net              720 ml   Filed Weights   03/28/17 0743  Weight: 75.8 kg (167 lb)    Examination:  General exam: Appears calm and comfortable  Respiratory system: Bilateral decreased breath sound at bases Cardiovascular system: S1 & S2 heard,Rate controlled  Gastrointestinal system: Abdomen is nondistended, soft and nontender. Normal bowel sounds heard. Central nervous system: Alert and oriented. No focal neurological deficits. Moving extremities Extremities: No cyanosis, clubbing, edema . Left thigh and hip mildly tender Skin: Skin over the left breast is erythematous with mild tenderness but no active discharge or open ulcers.  Psychiatry: Judgement and insight appear normal. Mood & affect appropriate.     Data Reviewed: I have personally reviewed following labs and imaging studies  CBC:  Recent Labs Lab 03/28/17 0831 03/29/17 0722  WBC 17.4* 9.2  NEUTROABS 16.1*  --   HGB 12.2 10.5*  HCT 36.6 32.3*  MCV 91.3 93.1  PLT 172 135*  Basic Metabolic Panel:  Recent Labs Lab 03/28/17 0831 03/29/17 0722  NA 136 136  K 4.2 3.6  CL 102 105  CO2 25 26  GLUCOSE 144* 133*  BUN 24* 16  CREATININE 0.92 0.67  CALCIUM 9.0 8.1*   GFR: Estimated Creatinine Clearance: 53.3 mL/min (by C-G formula based on SCr of 0.67 mg/dL). Liver Function Tests: No results for input(s): AST, ALT, ALKPHOS, BILITOT, PROT, ALBUMIN in the last 168 hours. No results for input(s): LIPASE, AMYLASE in the last 168 hours. No results for input(s): AMMONIA in the last 168 hours. Coagulation Profile:  Recent Labs Lab 03/28/17 1214  INR 0.98   Cardiac  Enzymes: No results for input(s): CKTOTAL, CKMB, CKMBINDEX, TROPONINI in the last 168 hours. BNP (last 3 results) No results for input(s): PROBNP in the last 8760 hours. HbA1C: No results for input(s): HGBA1C in the last 72 hours. CBG: No results for input(s): GLUCAP in the last 168 hours. Lipid Profile: No results for input(s): CHOL, HDL, LDLCALC, TRIG, CHOLHDL, LDLDIRECT in the last 72 hours. Thyroid Function Tests:  Recent Labs  03/28/17 1214  TSH 1.104   Anemia Panel: No results for input(s): VITAMINB12, FOLATE, FERRITIN, TIBC, IRON, RETICCTPCT in the last 72 hours. Sepsis Labs: No results for input(s): PROCALCITON, LATICACIDVEN in the last 168 hours.  No results found for this or any previous visit (from the past 240 hour(s)).       Radiology Studies: [place] Chest  Result Date: 03/28/2017 CLINICAL DATA:  76 year old female with a history of breast carcinoma. Concern for cellulitis or abscess. EXAM: CHEST ULTRASOUND COMPARISON:  None. FINDINGS: Limited ultrasound of the left chest in the region of clinical concern, with grayscale and color duplex performed. No focal fluid collection identified. IMPRESSION: Limited ultrasound of the left chest in the region of clinical concern demonstrates no focal fluid collection. Given the history of prior breast cancer and now left breast/chest wall symptoms, a complete outpatient oncologic workup as well as mammography workup should be considered. Electronically Signed   By: Corrie Mckusick D.O.   On: 03/28/2017 12:26   Dg Hip Unilat With Pelvis 2-3 Views Left  Result Date: 03/29/2017 CLINICAL DATA:  Sudden onset LEFT flank pain EXAM: DG HIP (WITH OR WITHOUT PELVIS) 2-3V LEFT COMPARISON:  None. FINDINGS: Hips are located. No evidence of pelvic fracture or sacral fracture. Dedicated view of the LEFT hip demonstrates no femoral neck fracture. IMPRESSION: No pelvic fracture or LEFT hip fracture. Electronically Signed   By: Suzy Bouchard M.D.    On: 03/29/2017 10:34        Scheduled Meds: . aspirin EC  81 mg Oral Daily  . heparin  5,000 Units Subcutaneous Q8H  . lisinopril  10 mg Oral Daily   And  . hydrochlorothiazide  6.25 mg Oral Daily  . multivitamin with minerals  1 tablet Oral Daily  . pyridoxine  100 mg Oral Daily  . timolol  1 drop Both Eyes BID   Continuous Infusions: . vancomycin 1,250 mg (03/29/17 1034)     LOS: 0 days        Aline August, MD [facility] Pager [phone]  If 7PM-7AM, please contact night-coverage [email or web address] Password [facility] 03/29/2017, 12:28 PM

## 2017-03-30 ENCOUNTER — Inpatient Hospital Stay (HOSPITAL_COMMUNITY): Payer: Medicare HMO

## 2017-03-30 LAB — CBC
HCT: 29.4 % — ABNORMAL LOW (ref 36.0–46.0)
HEMOGLOBIN: 9.7 g/dL — AB (ref 12.0–15.0)
MCH: 30.8 pg (ref 26.0–34.0)
MCHC: 33 g/dL (ref 30.0–36.0)
MCV: 93.3 fL (ref 78.0–100.0)
PLATELETS: 136 10*3/uL — AB (ref 150–400)
RBC: 3.15 MIL/uL — ABNORMAL LOW (ref 3.87–5.11)
RDW: 14.3 % (ref 11.5–15.5)
WBC: 7.3 10*3/uL (ref 4.0–10.5)

## 2017-03-30 LAB — BASIC METABOLIC PANEL
ANION GAP: 8 (ref 5–15)
BUN: 13 mg/dL (ref 6–20)
CALCIUM: 8.5 mg/dL — AB (ref 8.9–10.3)
CO2: 26 mmol/L (ref 22–32)
CREATININE: 0.69 mg/dL (ref 0.44–1.00)
Chloride: 104 mmol/L (ref 101–111)
GFR calc Af Amer: >60 mL/min (ref >60)
GLUCOSE: 115 mg/dL — AB (ref 65–99)
Potassium: 3.8 mmol/L (ref 3.5–5.1)
Sodium: 138 mmol/L (ref 135–145)

## 2017-03-30 LAB — C-REACTIVE PROTEIN: CRP: 19 mg/dL — AB (ref <1.0)

## 2017-03-30 LAB — SEDIMENTATION RATE: SED RATE: 83 mm/h — AB (ref 0–22)

## 2017-03-30 MED ORDER — CYCLOBENZAPRINE HCL 10 MG PO TABS
10.0000 mg | ORAL_TABLET | Freq: Three times a day (TID) | ORAL | Status: DC | PRN
  Administered 2017-03-30 – 2017-04-02 (×5): 10 mg via ORAL
  Filled 2017-03-30 (×5): qty 1

## 2017-03-30 MED ORDER — MORPHINE SULFATE (PF) 4 MG/ML IV SOLN
2.0000 mg | INTRAVENOUS | Status: DC | PRN
  Administered 2017-03-31 – 2017-04-01 (×6): 2 mg via INTRAVENOUS
  Filled 2017-03-30 (×7): qty 1

## 2017-03-30 MED ORDER — ADULT MULTIVITAMIN W/MINERALS CH
1.0000 | ORAL_TABLET | Freq: Every day | ORAL | Status: DC
  Administered 2017-03-30 – 2017-04-06 (×7): 1 via ORAL
  Filled 2017-03-30 (×7): qty 1

## 2017-03-30 NOTE — Progress Notes (Signed)
Patient ID: Briana Smith, female   DOB: [birth year], 76 y.o.   MRN: [id number]  PROGRESS NOTE    Briana Smith  YQM:[id number] DOB: [birth year] DOA: 03/28/2017 PCP: [name], MD   Brief Narrative:  76 year female with history of left breast cancer status post lumpectomy and chemoradiation, left breast cellulitis about 2 years ago and hypertension presented with left breast redness and pain and was admitted with cellulitis on intravenous antibiotics. She also is having severe left hip and thigh pain    Assessment & Plan:   Principal Problem:   Cellulitis of left breast Active Problems:   Essential hypertension   History of breast cancer   Cellulitis of breast  Cellulitis of the left breast - Mild improvement since admission. -Continue vancomycin. Follow cultures -Breast ultrasound did not show any abscess  Leukocytosis - Probably secondary to cellulitis; resolved  History of breast cancer -Status post lumpectomy, chemotherapy and radiation.  Essential hypertension -She is on lisinopril and HCTZ. Continued - Blood pressure controlled  Nausea and vomiting -Probably from cellulitis. - Resolved  Left hip and thigh pain, acute - Probably musculoskeletal; still having significant pain. Increase the dose of Flexeril 10 mg 3 times a day when necessary, continue oral opiate opiate. Add morphine IV when necessary severe pain  - X-ray of the left hip was unremarkable and lower extremity duplex ultrasound was negative for DVT - Order CT of the left hip and femur - PT eval - If condition does not improve, we will get orthopedics evaluation  Thrombocytopenia - Questionable cause. Monitor   DVT prophylaxis:SQ Heparin Code Status:Full code Family Communication: none at bedside  Disposition Plan:Home in 1-3 days  Consultants: None  Procedures:  lower extremity duplex ultrasound on 03/29/2017: Negative for DVT  Antimicrobials: Vancomycin from  03/28/2017  Subjective: Patient seen and examined at bedside. She thinks her left breast redness is improving. Her left hip and thigh pain is not improving and she persisted having significant pain with slight movement. No overnight fever or vomiting.  Objective: Vitals:   03/29/17 1132 03/29/17 1316 03/29/17 2050 03/30/17 0502  BP: 113/61 (!) 125/52 127/67 (!) 118/59  Pulse: 66 70 74 61  Resp:  18  17  Temp: 98.6 F (37 C) 100.1 F (37.8 C) 98.2 F (36.8 C) 98.3 F (36.8 C)  TempSrc: Oral Oral  Oral  SpO2: 98% 96% 100% 98%  Weight:      Height:        Intake/Output Summary (Last 24 hours) at 03/30/17 1052 Last data filed at 03/29/17 1900  Gross per 24 hour  Intake              240 ml  Output                3 ml  Net              237 ml   Filed Weights   03/28/17 0743  Weight: 75.8 kg (167 lb)    Examination:  General exam: Appears calm and comfortable  Respiratory system: Bilateral decreased breath sound at bases  Cardiovascular system: S1 & S2 heard, Rate controlled  Gastrointestinal system: Abdomen is nondistended, soft and nontender. Normal bowel sounds heard. Central nervous system: Alert and oriented. No focal neurological deficits. Moving extremities Extremities: No cyanosis, clubbing, edema. Left thigh and hip mildly tender but no erythema or increased warmth Skin: No rashes, lesions or ulcers Psychiatry: Judgement and insight appear normal. Mood & affect  appropriate.     Data Reviewed: I have personally reviewed following labs and imaging studies  CBC:  Recent Labs Lab 03/28/17 0831 03/29/17 0722 03/30/17 0522  WBC 17.4* 9.2 7.3  NEUTROABS 16.1*  --   --   HGB 12.2 10.5* 9.7*  HCT 36.6 32.3* 29.4*  MCV 91.3 93.1 93.3  PLT 172 135* 397*   Basic Metabolic Panel:  Recent Labs Lab 03/28/17 0831 03/29/17 0722 03/30/17 0522  NA 136 136 138  K 4.2 3.6 3.8  CL 102 105 104  CO2 25 26 26   GLUCOSE 144* 133* 115*  BUN 24* 16 13  CREATININE  0.92 0.67 0.69  CALCIUM 9.0 8.1* 8.5*   GFR: Estimated Creatinine Clearance: 53.3 mL/min (by C-G formula based on SCr of 0.69 mg/dL). Liver Function Tests: No results for input(s): AST, ALT, ALKPHOS, BILITOT, PROT, ALBUMIN in the last 168 hours. No results for input(s): LIPASE, AMYLASE in the last 168 hours. No results for input(s): AMMONIA in the last 168 hours. Coagulation Profile:  Recent Labs Lab 03/28/17 1214  INR 0.98   Cardiac Enzymes: No results for input(s): CKTOTAL, CKMB, CKMBINDEX, TROPONINI in the last 168 hours. BNP (last 3 results) No results for input(s): PROBNP in the last 8760 hours. HbA1C:  Recent Labs  03/28/17 1214  HGBA1C 5.9*   CBG: No results for input(s): GLUCAP in the last 168 hours. Lipid Profile: No results for input(s): CHOL, HDL, LDLCALC, TRIG, CHOLHDL, LDLDIRECT in the last 72 hours. Thyroid Function Tests:  Recent Labs  03/28/17 1214  TSH 1.104   Anemia Panel: No results for input(s): VITAMINB12, FOLATE, FERRITIN, TIBC, IRON, RETICCTPCT in the last 72 hours. Sepsis Labs: No results for input(s): PROCALCITON, LATICACIDVEN in the last 168 hours.  No results found for this or any previous visit (from the past 240 hour(s)).       Radiology Studies: [place] Chest  Result Date: 03/28/2017 CLINICAL DATA:  76 year old female with a history of breast carcinoma. Concern for cellulitis or abscess. EXAM: CHEST ULTRASOUND COMPARISON:  None. FINDINGS: Limited ultrasound of the left chest in the region of clinical concern, with grayscale and color duplex performed. No focal fluid collection identified. IMPRESSION: Limited ultrasound of the left chest in the region of clinical concern demonstrates no focal fluid collection. Given the history of prior breast cancer and now left breast/chest wall symptoms, a complete outpatient oncologic workup as well as mammography workup should be considered. Electronically Signed   By: Corrie Mckusick D.O.   On:  03/28/2017 12:26   Dg Hip Unilat With Pelvis 2-3 Views Left  Result Date: 03/29/2017 CLINICAL DATA:  Sudden onset LEFT flank pain EXAM: DG HIP (WITH OR WITHOUT PELVIS) 2-3V LEFT COMPARISON:  None. FINDINGS: Hips are located. No evidence of pelvic fracture or sacral fracture. Dedicated view of the LEFT hip demonstrates no femoral neck fracture. IMPRESSION: No pelvic fracture or LEFT hip fracture. Electronically Signed   By: Suzy Bouchard M.D.   On: 03/29/2017 10:34        Scheduled Meds: . aspirin EC  81 mg Oral Daily  . docusate sodium  100 mg Oral BID  . heparin  5,000 Units Subcutaneous Q8H  . lisinopril  10 mg Oral Daily   And  . hydrochlorothiazide  6.25 mg Oral Daily  . multivitamin with minerals  1 tablet Oral Daily  . pyridoxine  100 mg Oral Daily  . timolol  1 drop Both Eyes BID   Continuous Infusions: . vancomycin Stopped (  03/30/17 1010)     LOS: 1 day        Aline August, MD [facility] Pager [phone]  If 7PM-7AM, please contact night-coverage [email or web address] Password [facility] 03/30/2017, 10:52 AM

## 2017-03-31 LAB — BASIC METABOLIC PANEL
Anion gap: 8 (ref 5–15)
BUN: 10 mg/dL (ref 6–20)
CALCIUM: 8.6 mg/dL — AB (ref 8.9–10.3)
CHLORIDE: 103 mmol/L (ref 101–111)
CO2: 25 mmol/L (ref 22–32)
CREATININE: 0.74 mg/dL (ref 0.44–1.00)
GFR calc Af Amer: >60 mL/min (ref >60)
GFR calc non Af Amer: >60 mL/min (ref >60)
GLUCOSE: 149 mg/dL — AB (ref 65–99)
Potassium: 4.2 mmol/L (ref 3.5–5.1)
Sodium: 136 mmol/L (ref 135–145)

## 2017-03-31 LAB — CBC
HCT: 31.7 % — ABNORMAL LOW (ref 36.0–46.0)
HEMOGLOBIN: 10.6 g/dL — AB (ref 12.0–15.0)
MCH: 30.7 pg (ref 26.0–34.0)
MCHC: 33.4 g/dL (ref 30.0–36.0)
MCV: 91.9 fL (ref 78.0–100.0)
Platelets: 156 10*3/uL (ref 150–400)
RBC: 3.45 MIL/uL — AB (ref 3.87–5.11)
RDW: 14.1 % (ref 11.5–15.5)
WBC: 7.2 10*3/uL (ref 4.0–10.5)

## 2017-03-31 LAB — VANCOMYCIN, TROUGH: Vancomycin Tr: 4 ug/mL — ABNORMAL LOW (ref 15–20)

## 2017-03-31 LAB — MAGNESIUM: MAGNESIUM: 2.2 mg/dL (ref 1.7–2.4)

## 2017-03-31 MED ORDER — POLYETHYLENE GLYCOL 3350 17 G PO PACK
17.0000 g | PACK | Freq: Every day | ORAL | Status: DC
  Administered 2017-03-31 – 2017-04-01 (×2): 17 g via ORAL
  Filled 2017-03-31 (×2): qty 1

## 2017-03-31 MED ORDER — IBUPROFEN 200 MG PO TABS
400.0000 mg | ORAL_TABLET | Freq: Three times a day (TID) | ORAL | Status: DC
  Administered 2017-03-31 – 2017-04-05 (×13): 400 mg via ORAL
  Filled 2017-03-31 (×14): qty 2

## 2017-03-31 MED ORDER — VANCOMYCIN HCL IN DEXTROSE 1-5 GM/200ML-% IV SOLN
1000.0000 mg | Freq: Two times a day (BID) | INTRAVENOUS | Status: DC
  Administered 2017-03-31: 1000 mg via INTRAVENOUS
  Filled 2017-03-31: qty 200

## 2017-03-31 NOTE — Progress Notes (Signed)
Pharmacy Antibiotic Note  Briana Smith is a 76 y.o. female admitted on 03/28/2017 with cellulitis.  Pharmacy has been consulted for vancomycin dosing for L breast cellulitis. Wt 75.8, WBC elevated at 17.4, creat 0.92, AF. Pt with redness and tenderness to L breast, had emesis x 3, chills and sweats. S/p chemo, XRT to L breast 1 yr ago.   03/31/2017 VT=4, note drawn 2 hours late, subtherapeutic Scr 0.74, CrCl ~ 23mls/min WBC 7.2 afebrile  Plan: Increase vancomycin to 1000mg  IV q12h (goal trough 10-15) F/u renal fxn, wbc, temp  Vancomycin levels as needed  Height: 5' 0.5" (153.7 cm) Weight: 167 lb (75.8 kg) IBW/kg (Calculated) : 46.65  Temp (24hrs), Avg:98.1 F (36.7 C), Min:97.9 F (36.6 C), Max:98.3 F (36.8 C)   Recent Labs Lab 03/28/17 0831 03/29/17 0722 03/30/17 0522 03/31/17 1054  WBC 17.4* 9.2 7.3 7.2  CREATININE 0.92 0.67 0.69 0.74  VANCOTROUGH  --   --   --  4*    Estimated Creatinine Clearance: 53.3 mL/min (by C-G formula based on SCr of 0.74 mg/dL).    No Known Allergies  Thank you for allowing pharmacy to be a part of this patient's care.  Dolly Rias RPh 03/31/2017, 1:16 PM Pager [phone]

## 2017-03-31 NOTE — Progress Notes (Signed)
Patient ID: Briana Smith, female   DOB: 10-14-1940, 76 y.o.   MRN: [id number]  PROGRESS NOTE    Briana Smith  [id number]:[id number] DOB: 08-28-1941 DOA: 03/28/2017 PCP: [name], MD   Brief Narrative:  76 year female with history of left breast cancer status post lumpectomy and chemoradiation, left breast cellulitis about 2 years ago and hypertension presented with left breast redness and pain and was admitted with cellulitis on intravenous antibiotics. She also is having severe left  thigh pain   Assessment & Plan:   Principal Problem:   Cellulitis of left breast Active Problems:   Essential hypertension   History of breast cancer   Cellulitis of breast   Cellulitis of the left breast - Mild improvement since admission. -Continue vancomycin. Cultures negative so far -Breast ultrasound did not show any abscess  Leukocytosis - Probably secondary to cellulitis; resolved  History of breast cancer -Status post lumpectomy, chemotherapy and radiation.  Essential hypertension -She is on lisinopril and HCTZ. Continued - Blood pressure controlled  Nausea and vomiting -Probably from cellulitis. - Resolved  Left hip and thigh pain, acute - Probably musculoskeletal; still having significant thigh pain.  - Continue current dose of Flexeril, continue oral opiate and when necessary morphine. Add ibuprofen scheduled doses for anti-inflammatory properties. - X-ray of the left hip was unremarkable and lower extremity duplex ultrasound was negative for DVT - CT of the left femur was negative for any fractures - PT eval - If condition does not improve with physical therapy, we will get orthopedics evaluation  Thrombocytopenia - Questionable cause. Resolved  DVT prophylaxis:SQ Heparin Code Status:Full code Family Communication:Discussed with husband present at bedside Disposition Plan:Home in 2-3 days  Consultants:None  Procedures: lower extremity duplex  ultrasound on 03/29/2017: Negative for DVT  Antimicrobials: Vancomycin from 03/28/2017  Subjective: Patient seen and examined at bedside. She still complains of intermittent moderate to severe left thigh pain. She thinks her left breast cellulitis is improving. No overnight fever, nausea, vomiting.  Objective: Vitals:   03/30/17 1947 03/31/17 0457 03/31/17 0930 03/31/17 1002  BP: (!) 133/57 (!) 159/71 (!) 145/65 (!) 145/65  Pulse: 68 77 79   Resp: 18 18    Temp: 98.1 F (36.7 C) 97.9 F (36.6 C) 98.2 F (36.8 C)   TempSrc: Oral Oral Oral   SpO2: 100% 99% 99%   Weight:      Height:        Intake/Output Summary (Last 24 hours) at 03/31/17 1224 Last data filed at 03/30/17 1756  Gross per 24 hour  Intake              250 ml  Output                0 ml  Net              250 ml   Filed Weights   03/28/17 0743  Weight: 75.8 kg (167 lb)    Examination:  General exam: Appears calm and comfortable  Respiratory system: Bilateral decreased breath sound at bases Cardiovascular system: S1 & S2 heard, Rate controlled  Gastrointestinal system: Abdomen is nondistended, soft and nontender. Normal bowel sounds heard. Extremities: Left thigh mildly tender. No cyanosis, clubbing, edema  Data Reviewed: I have personally reviewed following labs and imaging studies  CBC:  Recent Labs Lab 03/28/17 0831 03/29/17 0722 03/30/17 0522 03/31/17 1054  WBC 17.4* 9.2 7.3 7.2  NEUTROABS 16.1*  --   --   --  HGB 12.2 10.5* 9.7* 10.6*  HCT 36.6 32.3* 29.4* 31.7*  MCV 91.3 93.1 93.3 91.9  PLT 172 135* 136* 094   Basic Metabolic Panel:  Recent Labs Lab 03/28/17 0831 03/29/17 0722 03/30/17 0522 03/31/17 1054  NA 136 136 138 136  K 4.2 3.6 3.8 4.2  CL 102 105 104 103  CO2 25 26 26 25   GLUCOSE 144* 133* 115* 149*  BUN 24* 16 13 10   CREATININE 0.92 0.67 0.69 0.74  CALCIUM 9.0 8.1* 8.5* 8.6*  MG  --   --   --  2.2   GFR: Estimated Creatinine Clearance: 53.3 mL/min (by C-G formula  based on SCr of 0.74 mg/dL). Liver Function Tests: No results for input(s): AST, ALT, ALKPHOS, BILITOT, PROT, ALBUMIN in the last 168 hours. No results for input(s): LIPASE, AMYLASE in the last 168 hours. No results for input(s): AMMONIA in the last 168 hours. Coagulation Profile:  Recent Labs Lab 03/28/17 1214  INR 0.98   Cardiac Enzymes: No results for input(s): CKTOTAL, CKMB, CKMBINDEX, TROPONINI in the last 168 hours. BNP (last 3 results) No results for input(s): PROBNP in the last 8760 hours. HbA1C: No results for input(s): HGBA1C in the last 72 hours. CBG: No results for input(s): GLUCAP in the last 168 hours. Lipid Profile: No results for input(s): CHOL, HDL, LDLCALC, TRIG, CHOLHDL, LDLDIRECT in the last 72 hours. Thyroid Function Tests: No results for input(s): TSH, T4TOTAL, FREET4, T3FREE, THYROIDAB in the last 72 hours. Anemia Panel: No results for input(s): VITAMINB12, FOLATE, FERRITIN, TIBC, IRON, RETICCTPCT in the last 72 hours. Sepsis Labs: No results for input(s): PROCALCITON, LATICACIDVEN in the last 168 hours.  No results found for this or any previous visit (from the past 240 hour(s)).       Radiology Studies: Ct Femur Left Wo Contrast  Result Date: 03/30/2017 CLINICAL DATA:  Left hip pain.  No known injury. EXAM: CT OF THE LOWER LEFT EXTREMITY WITHOUT CONTRAST TECHNIQUE: Multidetector CT imaging of the lower left extremity was performed according to the standard protocol. COMPARISON:  None. FINDINGS: Bones/Joint/Cartilage No fracture or dislocation. Normal alignment. Left hip joint space is maintained. Left knee joint spaces are maintained. No left knee joint effusion. No aggressive lytic or sclerotic osseous lesion. Ligaments Ligaments are suboptimally evaluated by CT. Muscles and Tendons Muscles are normal. No intramuscular fluid collection or hematoma. No muscle atrophy. Soft tissue No fluid collection or hematoma. No soft tissue mass. Mild peripheral  vascular atherosclerotic disease. IMPRESSION: No acute osseous injury of the left femur. Electronically Signed   By: Kathreen Devoid   On: 03/30/2017 13:20        Scheduled Meds: . aspirin EC  81 mg Oral Daily  . docusate sodium  100 mg Oral BID  . heparin  5,000 Units Subcutaneous Q8H  . lisinopril  10 mg Oral Daily   And  . hydrochlorothiazide  6.25 mg Oral Daily  . ibuprofen  400 mg Oral TID  . multivitamin with minerals  1 tablet Oral Daily  . polyethylene glycol  17 g Oral Daily  . pyridoxine  100 mg Oral Daily  . timolol  1 drop Both Eyes BID   Continuous Infusions: . vancomycin 1,250 mg (03/31/17 1159)     LOS: 2 days        Aline August, MD [facility] Pager [phone]  If 7PM-7AM, please contact night-coverage [email or web address] Password [facility] 03/31/2017, 12:24 PM

## 2017-03-31 NOTE — Evaluation (Signed)
Physical Therapy Evaluation Patient Details Name: Briana Smith MRN: [id number] DOB: Feb 06, 1941 Today's Date: 03/31/2017   History of Present Illness  Briana Smith is a 76 y.o. female with medical history significant of left breast cancer status post lumpectomy and chemoradiation, history of left breast cellulitis about 2 years ago and hypertension. Patient reported she developed fever, sweating and breast soreness about 1 AM this morning, when she checked her breast it was very erythematous and tender so she came to the hospital for further evaluation; In addition pt developed L  hip /LE pain; xrays and CT negative  Clinical Impression  Pt admitted with above diagnosis. Pt currently with functional limitations due to the deficits listed below (see PT Problem List). * Pt will benefit from skilled PT to increase their independence and safety with mobility to allow discharge to the venue listed below.   In addition to pt's cellulitis it appears she may have a significant L hip adductor/hip flexor strain; CT neg for fx; L hip is painful with active flexion & adduction, passive extension & abduction; pt does describe an incr in her activity level (climbing ladders, planting shrubs, carrying and moving heavy objects, etc.-- recently while preparing for a graduation party) and this may have been her mechanism of injury; instructed pt on bed mobility, positioning, heat and taking muscle relaxers to help  with pain control and allow incr mobility; pt pain better controlled with RW and she was able to amb 50' with min/guard; likely will not need f/u;    Follow Up Recommendations No PT follow up    Equipment Recommendations  None recommended by PT (has RW)    Recommendations for Other Services       Precautions / Restrictions Precautions Precautions: Fall      Mobility  Bed Mobility Overal bed mobility: Needs Assistance Bed Mobility: Supine to Sit;Sit to Supine     Supine to sit: Min  assist Sit to supine: Min assist   General bed mobility comments: assist with bil LEs to maintain adduction and decr pain  Transfers Overall transfer level: Needs assistance Equipment used: Rolling walker (2 wheeled) Transfers: Sit to/from Stand Sit to Stand: Min assist;Min guard            Ambulation/Gait Ambulation/Gait assistance: Min guard;Min assist Ambulation Distance (Feet): 50 Feet Assistive device: Rolling walker (2 wheeled) Gait Pattern/deviations: Step-to pattern;Antalgic     General Gait Details: cues for hand placement and LLE position  Stairs            Wheelchair Mobility    Modified Rankin (Stroke Patients Only)       Balance Overall balance assessment: Needs assistance   Sitting balance-Leahy Scale: Good       Standing balance-Leahy Scale: Fair                               Pertinent Vitals/Pain Pain Assessment: 0-10 Pain Score: 8  Pain Location: L hip/groin Pain Descriptors / Indicators: Sore;Guarding;Grimacing Pain Intervention(s): Limited activity within patient's tolerance;Monitored during session;Premedicated before session;Repositioned;Heat applied    Home Living Family/patient expects to be discharged to:: Private residence Living Arrangements: Spouse/significant other   Type of Home: House Home Access: Level entry     Home Layout: Able to live on main level with bedroom/bathroom;Two level Home Equipment: None      Prior Function Level of Independence: Independent  Hand Dominance        Extremity/Trunk Assessment   Upper Extremity Assessment Upper Extremity Assessment: Overall WFL for tasks assessed    Lower Extremity Assessment Lower Extremity Assessment: LLE deficits/detail LLE Deficits / Details: PROM WFL; hip flexion/extension/adduction 3/5, limited by pain LLE: Unable to fully assess due to pain       Communication   Communication: No difficulties  Cognition  Arousal/Alertness: Awake/alert Behavior During Therapy: WFL for tasks assessed/performed Overall Cognitive Status: Within Functional Limits for tasks assessed                                        General Comments      Exercises     Assessment/Plan    PT Assessment Patient needs continued PT services  PT Problem List Decreased strength;Decreased mobility;Pain;Decreased knowledge of use of DME       PT Treatment Interventions DME instruction;Gait training;Functional mobility training;Therapeutic activities;Therapeutic exercise;Patient/family education    PT Goals (Current goals can be found in the Care Plan section)  Acute Rehab PT Goals Patient Stated Goal: less pain PT Goal Formulation: With patient Time For Goal Achievement: 04/07/17 Potential to Achieve Goals: Good    Frequency Min 3X/week   Barriers to discharge        Co-evaluation               AM-PAC PT "6 Clicks" Daily Activity  Outcome Measure Difficulty turning over in bed (including adjusting bedclothes, sheets and blankets)?: Total Difficulty moving from lying on back to sitting on the side of the bed? : Total Difficulty sitting down on and standing up from a chair with arms (e.g., wheelchair, bedside commode, etc,.)?: Total Help needed moving to and from a bed to chair (including a wheelchair)?: A Little Help needed walking in hospital room?: A Little Help needed climbing 3-5 steps with a railing? : A Little 6 Click Score: 12    End of Session Equipment Utilized During Treatment: Gait belt Activity Tolerance: Patient tolerated treatment well Patient left: in bed;with call bell/phone within reach;with family/visitor present;with bed alarm set   PT Visit Diagnosis: Difficulty in walking, not elsewhere classified (R26.2);Pain Pain - Right/Left: Left Pain - part of body: Hip    Time: 5456-2563 PT Time Calculation (min) (ACUTE ONLY): 25 min   Charges:   PT Evaluation $PT Eval  Low Complexity: 1 Procedure PT Treatments $Gait Training: 8-22 mins   PT G CodesKenyon Smith, PT Pager: [phone] 03/31/2017   [facility] 03/31/2017, 1:59 PM

## 2017-04-01 ENCOUNTER — Inpatient Hospital Stay (HOSPITAL_COMMUNITY): Payer: Medicare HMO

## 2017-04-01 LAB — MRSA PCR SCREENING: MRSA by PCR: NEGATIVE

## 2017-04-01 LAB — C-REACTIVE PROTEIN: CRP: 14.2 mg/dL — ABNORMAL HIGH (ref <1.0)

## 2017-04-01 LAB — BASIC METABOLIC PANEL
ANION GAP: 6 (ref 5–15)
BUN: 13 mg/dL (ref 6–20)
CALCIUM: 8.4 mg/dL — AB (ref 8.9–10.3)
CHLORIDE: 107 mmol/L (ref 101–111)
CO2: 27 mmol/L (ref 22–32)
Creatinine, Ser: 0.72 mg/dL (ref 0.44–1.00)
GFR calc non Af Amer: >60 mL/min (ref >60)
GLUCOSE: 110 mg/dL — AB (ref 65–99)
POTASSIUM: 3.8 mmol/L (ref 3.5–5.1)
Sodium: 140 mmol/L (ref 135–145)

## 2017-04-01 LAB — CBC
HEMATOCRIT: 29 % — AB (ref 36.0–46.0)
HEMOGLOBIN: 9.5 g/dL — AB (ref 12.0–15.0)
MCH: 30.3 pg (ref 26.0–34.0)
MCHC: 32.8 g/dL (ref 30.0–36.0)
MCV: 92.4 fL (ref 78.0–100.0)
Platelets: 162 10*3/uL (ref 150–400)
RBC: 3.14 MIL/uL — ABNORMAL LOW (ref 3.87–5.11)
RDW: 14.4 % (ref 11.5–15.5)
WBC: 5.4 10*3/uL (ref 4.0–10.5)

## 2017-04-01 LAB — MAGNESIUM: Magnesium: 2 mg/dL (ref 1.7–2.4)

## 2017-04-01 MED ORDER — BISACODYL 10 MG RE SUPP
10.0000 mg | Freq: Once | RECTAL | Status: AC
  Administered 2017-04-01: 10 mg via RECTAL
  Filled 2017-04-01: qty 1

## 2017-04-01 MED ORDER — CEFAZOLIN SODIUM-DEXTROSE 1-4 GM/50ML-% IV SOLN
1.0000 g | Freq: Three times a day (TID) | INTRAVENOUS | Status: DC
  Administered 2017-04-01 – 2017-04-03 (×7): 1 g via INTRAVENOUS
  Filled 2017-04-01 (×8): qty 50

## 2017-04-01 MED ORDER — POLYETHYLENE GLYCOL 3350 17 G PO PACK
17.0000 g | PACK | Freq: Two times a day (BID) | ORAL | Status: DC
Start: 2017-04-01 — End: 2017-04-06
  Administered 2017-04-01 – 2017-04-04 (×4): 17 g via ORAL
  Filled 2017-04-01 (×5): qty 1

## 2017-04-01 NOTE — Progress Notes (Signed)
To CT

## 2017-04-01 NOTE — Progress Notes (Signed)
Patient ID: Briana Smith, female   DOB: 1941/07/01, 76 y.o.   MRN: [id number]  PROGRESS NOTE    Briana Smith  NWG:[id number] DOB: 1941/01/25 DOA: 03/28/2017 PCP: [name], MD   Brief Narrative:  76 year female with history of left breast cancer status post lumpectomy and chemoradiation, left breast cellulitis about 2 years ago and hypertension presented with left breast redness and pain and was admitted with cellulitis on intravenous antibiotics. She also is having severe left  thigh pain   Assessment & Plan:   Principal Problem:   Cellulitis of left breast Active Problems:   Essential hypertension   History of breast cancer   Cellulitis of breast   Cellulitis of the left breast - she received iv vancomycin since admission. Cultures negative so far, mrsa screening negative -Breast ultrasound did not show any abscess -improving, d/c vanc , changed to iv ancef  Leukocytosis Wbc 17.4 on admission - Probably secondary to cellulitis; resolved  History of breast cancer -Status post lumpectomy, chemotherapy and radiation. Report last memogram in 10/2016 which is unremarkable  Essential hypertension -She is on lisinopril and HCTZ. Continued - Blood pressure controlled  Nausea and vomiting -Probably from cellulitis. - Resolved  Left hip and thigh pain, acute - Probably musculoskeletal; still having significant thigh pain.  - Continue current dose of Flexeril, continue oral opiate and when necessary morphine. Add ibuprofen scheduled doses for anti-inflammatory properties. - X-ray of the left hip was unremarkable and lower extremity duplex ultrasound was negative for DVT - CT of the left femur was negative for any fractures - PT eval - she still has significant pain, not able to bear weight, MTI hip ordered, may need orthopedics evaluation  Thrombocytopenia - Questionable cause. Resolved  DVT prophylaxis:SQ Heparin Code Status:Full code Family  Communication:Discussed with husband present at bedside Disposition Plan:Home in 2-3 days  Consultants:None  Procedures: lower extremity duplex ultrasound on 03/29/2017: Negative for DVT  Antimicrobials: Vancomycin from 03/28/2017 to 6/27 Ancef from 6/27  Subjective: She report left breast is improving, But she is irritated due to severe left thigh pain.  She report constipation Husband at bedside  Objective: Vitals:   03/31/17 1344 03/31/17 2114 04/01/17 0511 04/01/17 1044  BP: (!) 105/42 (!) 121/58 (!) 107/52 132/63  Pulse: 67 65 60 69  Resp: 18 18 18    Temp: 98 F (36.7 C) 98.5 F (36.9 C) 97.6 F (36.4 C)   TempSrc: Oral Oral Oral   SpO2: 98% 99% 97%   Weight:      Height:        Intake/Output Summary (Last 24 hours) at 04/01/17 1329 Last data filed at 04/01/17 0830  Gross per 24 hour  Intake             1360 ml  Output                0 ml  Net             1360 ml   Filed Weights   03/28/17 0743  Weight: 75.8 kg (167 lb)    Examination:  General exam: Appears calm and comfortable  Respiratory system: Bilateral decreased breath sound at bases Cardiovascular system: S1 & S2 heard, Rate controlled  Gastrointestinal system: Abdomen is nondistended, soft and nontender. Normal bowel sounds heard. Extremities: Left thigh mildly tender. No cyanosis, clubbing, edema Skin: left breast erythema has much improved, still has some induration under the breast at about 6 o'clock region,  Mild  tender, no drainage  Data Reviewed: I have personally reviewed following labs and imaging studies  CBC:  Recent Labs Lab 03/28/17 0831 03/29/17 0722 03/30/17 0522 03/31/17 1054 04/01/17 0445  WBC 17.4* 9.2 7.3 7.2 5.4  NEUTROABS 16.1*  --   --   --   --   HGB 12.2 10.5* 9.7* 10.6* 9.5*  HCT 36.6 32.3* 29.4* 31.7* 29.0*  MCV 91.3 93.1 93.3 91.9 92.4  PLT 172 135* 136* 156 144   Basic Metabolic Panel:  Recent Labs Lab 03/28/17 0831 03/29/17 0722 03/30/17 0522  03/31/17 1054 04/01/17 0445  NA 136 136 138 136 140  K 4.2 3.6 3.8 4.2 3.8  CL 102 105 104 103 107  CO2 25 26 26 25 27   GLUCOSE 144* 133* 115* 149* 110*  BUN 24* 16 13 10 13   CREATININE 0.92 0.67 0.69 0.74 0.72  CALCIUM 9.0 8.1* 8.5* 8.6* 8.4*  MG  --   --   --  2.2 2.0   GFR: Estimated Creatinine Clearance: 53.3 mL/min (by C-G formula based on SCr of 0.72 mg/dL). Liver Function Tests: No results for input(s): AST, ALT, ALKPHOS, BILITOT, PROT, ALBUMIN in the last 168 hours. No results for input(s): LIPASE, AMYLASE in the last 168 hours. No results for input(s): AMMONIA in the last 168 hours. Coagulation Profile:  Recent Labs Lab 03/28/17 1214  INR 0.98   Cardiac Enzymes: No results for input(s): CKTOTAL, CKMB, CKMBINDEX, TROPONINI in the last 168 hours. BNP (last 3 results) No results for input(s): PROBNP in the last 8760 hours. HbA1C: No results for input(s): HGBA1C in the last 72 hours. CBG: No results for input(s): GLUCAP in the last 168 hours. Lipid Profile: No results for input(s): CHOL, HDL, LDLCALC, TRIG, CHOLHDL, LDLDIRECT in the last 72 hours. Thyroid Function Tests: No results for input(s): TSH, T4TOTAL, FREET4, T3FREE, THYROIDAB in the last 72 hours. Anemia Panel: No results for input(s): VITAMINB12, FOLATE, FERRITIN, TIBC, IRON, RETICCTPCT in the last 72 hours. Sepsis Labs: No results for input(s): PROCALCITON, LATICACIDVEN in the last 168 hours.  Recent Results (from the past 240 hour(s))  MRSA PCR Screening     Status: None   Collection Time: 04/01/17  8:43 AM  Result Value Ref Range Status   MRSA by PCR NEGATIVE NEGATIVE Final    Comment:        The GeneXpert MRSA Assay (FDA approved for NASAL specimens only), is one component of a comprehensive MRSA colonization surveillance program. It is not intended to diagnose MRSA infection nor to guide or monitor treatment for MRSA infections.          Radiology Studies: No results  found.      Scheduled Meds: . aspirin EC  81 mg Oral Daily  . docusate sodium  100 mg Oral BID  . heparin  5,000 Units Subcutaneous Q8H  . lisinopril  10 mg Oral Daily   And  . hydrochlorothiazide  6.25 mg Oral Daily  . ibuprofen  400 mg Oral TID  . multivitamin with minerals  1 tablet Oral Daily  . polyethylene glycol  17 g Oral BID  . pyridoxine  100 mg Oral Daily  . timolol  1 drop Both Eyes BID   Continuous Infusions: .  ceFAZolin (ANCEF) IV 1 g (04/01/17 1237)     LOS: 3 days     Time spent> 74mins   Kimbria Camposano, MD PhD [facility] Pager [phone]  If 7PM-7AM, please contact night-coverage [email or web address] Password [email or web address] 04/01/2017, 1:29 PM

## 2017-04-02 ENCOUNTER — Inpatient Hospital Stay (HOSPITAL_COMMUNITY): Payer: Medicare HMO

## 2017-04-02 LAB — SYNOVIAL CELL COUNT + DIFF, W/ CRYSTALS
CRYSTALS FLUID: NONE SEEN
LYMPHOCYTES-SYNOVIAL FLD: 3 % (ref 0–20)
NEUTROPHIL, SYNOVIAL: 97 % — AB (ref 0–25)
WBC, SYNOVIAL: UNDETERMINED /mm3 (ref 0–200)

## 2017-04-02 LAB — CBC
HEMATOCRIT: 30.1 % — AB (ref 36.0–46.0)
HEMOGLOBIN: 9.8 g/dL — AB (ref 12.0–15.0)
MCH: 29.5 pg (ref 26.0–34.0)
MCHC: 32.6 g/dL (ref 30.0–36.0)
MCV: 90.7 fL (ref 78.0–100.0)
Platelets: 175 10*3/uL (ref 150–400)
RBC: 3.32 MIL/uL — AB (ref 3.87–5.11)
RDW: 14.2 % (ref 11.5–15.5)
WBC: 5.1 10*3/uL (ref 4.0–10.5)

## 2017-04-02 LAB — BASIC METABOLIC PANEL
ANION GAP: 9 (ref 5–15)
BUN: 15 mg/dL (ref 6–20)
CALCIUM: 9 mg/dL (ref 8.9–10.3)
CO2: 29 mmol/L (ref 22–32)
Chloride: 104 mmol/L (ref 101–111)
Creatinine, Ser: 0.77 mg/dL (ref 0.44–1.00)
Glucose, Bld: 106 mg/dL — ABNORMAL HIGH (ref 65–99)
POTASSIUM: 3.8 mmol/L (ref 3.5–5.1)
SODIUM: 142 mmol/L (ref 135–145)

## 2017-04-02 MED ORDER — ASPIRIN EC 81 MG PO TBEC: 81.0000 mg | DELAYED_RELEASE_TABLET | Freq: Every day | ORAL | Status: DC

## 2017-04-02 MED ORDER — IOPAMIDOL (ISOVUE-300) INJECTION 61%
INTRAVENOUS | Status: AC
  Administered 2017-04-02: 20 mL
  Filled 2017-04-02: qty 50

## 2017-04-02 MED ORDER — LIDOCAINE HCL 1 % IJ SOLN
INTRAMUSCULAR | Status: AC
  Administered 2017-04-02: 20 mL
  Filled 2017-04-02: qty 20

## 2017-04-02 MED ORDER — BISACODYL 10 MG RE SUPP
10.0000 mg | Freq: Every day | RECTAL | Status: AC
  Administered 2017-04-02: 10 mg via RECTAL
  Filled 2017-04-02: qty 1

## 2017-04-02 MED ORDER — SENNOSIDES-DOCUSATE SODIUM 8.6-50 MG PO TABS
2.0000 | ORAL_TABLET | Freq: Two times a day (BID) | ORAL | Status: DC
  Administered 2017-04-02 – 2017-04-05 (×6): 2 via ORAL
  Filled 2017-04-02 (×6): qty 2

## 2017-04-02 MED ORDER — MINERAL OIL RE ENEM
1.0000 | ENEMA | Freq: Once | RECTAL | Status: DC
  Filled 2017-04-02: qty 1

## 2017-04-02 NOTE — Progress Notes (Signed)
Physical Therapy Treatment Patient Details Name: Briana Smith MRN: [id number] DOB: 09/29/1941 Today's Date: 04/02/2017    History of Present Illness Briana Smith is a 76 y.o. female with medical history significant of left breast cancer status post lumpectomy and chemoradiation, history of left breast cellulitis about 2 years ago and hypertension. Patient reported she developed fever, sweating and breast soreness about 1 AM this morning, when she checked her breast it was very erythematous and tender so she came to the hospital for further evaluation; In addition pt developed L  hip /LE pain; xrays and CT negative    PT Comments    Discussed possible adductor strain with pt last session(see previous note); MRI=adductor strain/edema, glut minimus strain/ mild L hip effusion; light soft tissue mobilization L anterior/medial thigh, positioned in hip and knee  Flexion to decr tension hip musculature; encouraged pt to continue muscle relaxers in conjunction with NSAIDs; overall improving, incr independence with mobility; continue plan of care  Follow Up Recommendations  Outpatient PT (??)     Equipment Recommendations  None recommended by PT    Recommendations for Other Services       Precautions / Restrictions Precautions Precautions: Fall Restrictions Weight Bearing Restrictions: No    Mobility  Bed Mobility Overal bed mobility: Needs Assistance Bed Mobility: Supine to Sit;Sit to Supine     Supine to sit: Min guard Sit to supine: Min guard   General bed mobility comments: pt instructed in use of sheet loop/strap to self assist LLE on and off bed; min/guard for safety  Transfers Overall transfer level: Needs assistance Equipment used: Rolling walker (2 wheeled) Transfers: Sit to/from Stand Sit to Stand: Supervision;Min guard         General transfer comment: cues for hand placement  Ambulation/Gait Ambulation/Gait assistance: Min guard;Supervision Ambulation  Distance (Feet): 60 Feet Assistive device: Rolling walker (2 wheeled) Gait Pattern/deviations: Step-to pattern;Antalgic     General Gait Details: cues for gait progression, foot flat on L; improved WBing/ wt shift on LLE    Stairs            Wheelchair Mobility    Modified Rankin (Stroke Patients Only)       Balance             Standing balance-Leahy Scale: Fair                              Cognition Arousal/Alertness: Awake/alert Behavior During Therapy: WFL for tasks assessed/performed Overall Cognitive Status: Within Functional Limits for tasks assessed                                        Exercises      General Comments        Pertinent Vitals/Pain Pain Assessment: 0-10 Pain Location: L hip/groin Pain Descriptors / Indicators: Sore;Guarding;Grimacing Pain Intervention(s): Monitored during session;Limited activity within patient's tolerance;Patient requesting pain meds-RN notified    Home Living                      Prior Function            PT Goals (current goals can now be found in the care plan section) Acute Rehab PT Goals Patient Stated Goal: less pain PT Goal Formulation: With patient Time For Goal Achievement: 04/07/17 Potential to Achieve Goals:  Good Progress towards PT goals: Progressing toward goals    Frequency    Min 3X/week      PT Plan Current plan remains appropriate    Co-evaluation              AM-PAC PT "6 Clicks" Daily Activity  Outcome Measure  Difficulty turning over in bed (including adjusting bedclothes, sheets and blankets)?: Total Difficulty moving from lying on back to sitting on the side of the bed? : Total Difficulty sitting down on and standing up from a chair with arms (e.g., wheelchair, bedside commode, etc,.)?: Total Help needed moving to and from a bed to chair (including a wheelchair)?: A Little Help needed walking in hospital room?: A Little Help  needed climbing 3-5 steps with a railing? : A Little 6 Click Score: 12    End of Session Equipment Utilized During Treatment: Gait belt Activity Tolerance: Patient tolerated treatment well Patient left: in bed;with call bell/phone within reach;with family/visitor present (alarm not on initially)   PT Visit Diagnosis: Difficulty in walking, not elsewhere classified (R26.2);Pain Pain - Right/Left: Left Pain - part of body: Hip     Time: 1504-1364 PT Time Calculation (min) (ACUTE ONLY): 26 min  Charges:  $Gait Training: 8-22 mins $Therapeutic Activity: 8-22 mins                    G CodesKenyon Ana, PT Pager: [phone] 04/02/2017    [facility] 04/02/2017, 12:55 PM

## 2017-04-02 NOTE — Consult Note (Signed)
Patient ID: Briana Smith MRN: [id number] DOB/AGE: 1941/07/24 76 y.o.  Admit date: 03/28/2017  Admission Diagnoses:  Principal Problem:   Cellulitis of left breast Active Problems:   Essential hypertension   History of breast cancer   Cellulitis of breast   HPI: Very pleasant 76 year old female pt with a past medical hx significant for Breast cancer.  She reports her treatments were complete last August.  The pt presented to the hospital on Saturday with cellulitis of her left breast.  She said she has had it before since her cancer.  She reports the left hip pain started at the same time as the cellulitis.  She reports significant left groin pain especially with weightbearing and ambulation.  She reports radiating pain into her anterior proximal LLE that does not go below the knee.  The pt reports the cellulitis has been improving but the left hip pain doe snot seem to be resolving.  The pt reports for the last two weeks she has been helping her husband with a significant amount of yard work and house maintenance.   Past Medical History: Past Medical History:  Diagnosis Date  . Cancer [facility])    breast cancer- left  . Heart murmur    years ago. no recent issues  . Hyperlipidemia   . Hypertension     Surgical History: Past Surgical History:  Procedure Laterality Date  . ABDOMINAL HYSTERECTOMY  1987   fibroids  . BREAST LUMPECTOMY  10/23/10   left  . PORT-A-CATH REMOVAL  09/02/2011   Procedure: REMOVAL PORT-A-CATH;  Surgeon: Adin Hector, MD;  Location: [place];  Service: General;  Laterality: Right;    Family History: Family History  Problem Relation Age of Onset  . COPD Father   . Diabetes Mother   . Dementia Mother   . Kidney disease Mother        not on dialysis  . Cancer Brother        jaw  . Hypertension Brother     Social History: Social History   Social History  . Marital status: Married    Spouse name: N/A  . Number of  children: N/A  . Years of education: N/A   Occupational History  . Not on file.   Social History Main Topics  . Smoking status: Never Smoker  . Smokeless tobacco: Never Used  . Alcohol use No  . Drug use: No  . Sexual activity: Not on file   Other Topics Concern  . Not on file   Social History Narrative   Married. Lives with husband. 3 children. 3 grandkids (22, 22, 18 in 2018).       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       Hobbies: working puzzles, reading, shoping, beach    Allergies: Patient has no known allergies.  Medications: I have reviewed the patient's current medications.  Vital Signs: Patient Vitals for the past 24 hrs:  BP Temp Temp src Pulse Resp SpO2  04/02/17 1312 (!) 133/59 98.5 F (36.9 C) Oral 73 - 98 %  04/02/17 0926 131/61 98.1 F (36.7 C) Oral 80 - 98 %  04/02/17 0553 135/66 97.9 F (36.6 C) Oral 70 18 97 %  04/01/17 2106 122/60 98.3 F (36.8 C) Oral 68 18 100 %    Radiology: [place] Chest  Result Date: 03/28/2017 CLINICAL DATA:  76 year old female with a history of breast carcinoma. Concern for cellulitis or abscess. EXAM:  CHEST ULTRASOUND COMPARISON:  None. FINDINGS: Limited ultrasound of the left chest in the region of clinical concern, with grayscale and color duplex performed. No focal fluid collection identified. IMPRESSION: Limited ultrasound of the left chest in the region of clinical concern demonstrates no focal fluid collection. Given the history of prior breast cancer and now left breast/chest wall symptoms, a complete outpatient oncologic workup as well as mammography workup should be considered. Electronically Signed   By: Corrie Mckusick D.O.   On: 03/28/2017 12:26   Ct Femur Left Wo Contrast  Result Date: 03/30/2017 CLINICAL DATA:  Left hip pain.  No known injury. EXAM: CT OF THE LOWER LEFT EXTREMITY WITHOUT CONTRAST TECHNIQUE: Multidetector CT imaging of the lower left extremity was performed according to the standard  protocol. COMPARISON:  None. FINDINGS: Bones/Joint/Cartilage No fracture or dislocation. Normal alignment. Left hip joint space is maintained. Left knee joint spaces are maintained. No left knee joint effusion. No aggressive lytic or sclerotic osseous lesion. Ligaments Ligaments are suboptimally evaluated by CT. Muscles and Tendons Muscles are normal. No intramuscular fluid collection or hematoma. No muscle atrophy. Soft tissue No fluid collection or hematoma. No soft tissue mass. Mild peripheral vascular atherosclerotic disease. IMPRESSION: No acute osseous injury of the left femur. Electronically Signed   By: Kathreen Devoid   On: 03/30/2017 13:20   Mr Hip Left Wo Contrast  Result Date: 04/02/2017 CLINICAL DATA:  Left hip pain. EXAM: MR OF THE LEFT HIP WITHOUT CONTRAST TECHNIQUE: Multiplanar, multisequence MR imaging was performed. No intravenous contrast was administered. COMPARISON:  CT left hip 03/30/2017 FINDINGS: Bones: No hip fracture, dislocation or avascular necrosis. No periosteal reaction or bone destruction. No aggressive osseous lesion. Normal sacrum and sacroiliac joints. No SI joint widening or erosive changes. Disc desiccation at L5-S1. Articular cartilage and labrum Articular cartilage:  No chondral defect. Labrum: Grossly intact, but evaluation is limited by lack of intraarticular fluid. Joint or bursal effusion Joint effusion: Small left hip joint effusion. No right hip joint effusion. No SI joint effusion. Bursae:  No bursa formation. Muscles and tendons Flexors: Normal. Extensors: Normal. Abductors: Mild muscle edema within the left operator externus, pectineus and adductor brevis muscles most concerning for muscle strain versus mild myositis. No tendon tear. Adductors: Normal. Gluteals: Mild muscle edema in the right gluteus minimus muscle. Mild muscle edema in the left gluteus minimus muscle. Mild edema surrounding the proximal left vastus lateralis muscle. Hamstrings: Normal. Other findings  Miscellaneous: Small amount of pelvic free fluid. Prior hysterectomy. No fluid collection or hematoma. No inguinal lymphadenopathy. No inguinal hernia. IMPRESSION: 1. Mild muscle edema within the bilateral gluteus minimus muscles, left obturator externus, left pectineus and left adductor brevis muscles most concerning for muscle strain versus mild myositis versus reactive inflammatory changes secondary to left hip joint effusion. 2. Nonspecific left hip joint effusion. If there is concern regarding septic arthritis, recommend arthrocentesis. 3. Electronically Signed   By: Kathreen Devoid   On: 04/02/2017 08:32   Dg Fluoro Guided Needle Plc Aspiration/injection Loc  Result Date: 04/02/2017 CLINICAL DATA:  76 y/o female with unexplained severe left hip pain following admission for breast cellulitis. Abnormal hip MRI including left hip joint effusion. Ultrasound guided left hip aspiration requested. EXAM: LEFT HIP ASPIRATION UNDER FLUOROSCOPY FLUOROSCOPY TIME:  Fluoroscopy Time:  0 minutes 29 seconds Radiation Exposure Index (if provided by the fluoroscopic device): 7.26 mGy Number of Acquired Spot Images: 0 PROCEDURE: The rationale for the procedure including risks and benefits was discussed with the  patient and informed consent was obtained. A "time-out" was performed. The patient reported severe pain in the left hip when lying with the leg flat, and the left hip in a neutral position. And she was holding her left hip flexed which offered pain relief. Therefore, she initially refused fluoroscopic left hip aspiration in the usual neutral position. I agreed to attempt a left hip aspiration with her hip flexed, but advised that the fluoroscopic approach to the left hip joint may not be feasible in that position. Overlying skin site was identified fluoroscopically and the Overlying skin prepped with Betadine, draped in the usual sterile fashion, and infiltrated locally with buffered Lidocaine. Curved 3.5 inch x 20 gauge  spinal needle advanced toward the left hip joint with intermittent fluoroscopy, but it soon became apparent that with the left hip significantly flexed, the joint space could not be approached. She agreed for me to briefly attempt a left hip aspiration with the typical neutral position of the left hip. She moved her left hip into the neutral position which did cause significant pain. Under fluoroscopic guidance the spinal needle was then advanced along the left femoral neck to the margin of the left femoral head. Aspiration with a 10 cc syringe then yielded about 2 mm of viscous serosanguineous fluid. Additional aspiration with a second 10 cc syringe yielded only trace additional fluid. Before withdrawing the needle, 2 mL of Isovue 370 was injected and demonstrated an intra-arterial spread of contrast. The needle was withdrawn, direct pressure was held, and hemostasis was noted. The patient tolerated the procedure, and was in stable condition at its conclusion. IMPRESSION: Successful fluoroscopic guided left hip joint aspiration, yielding a 2 mL of viscous serosanguineous fluid. This was sent to the lab for analysis. Electronically Signed   By: Genevie Ann M.D.   On: 04/02/2017 16:45   Dg Hip Unilat With Pelvis 2-3 Views Left  Result Date: 03/29/2017 CLINICAL DATA:  Sudden onset LEFT flank pain EXAM: DG HIP (WITH OR WITHOUT PELVIS) 2-3V LEFT COMPARISON:  None. FINDINGS: Hips are located. No evidence of pelvic fracture or sacral fracture. Dedicated view of the LEFT hip demonstrates no femoral neck fracture. IMPRESSION: No pelvic fracture or LEFT hip fracture. Electronically Signed   By: Suzy Bouchard M.D.   On: 03/29/2017 10:34    Labs:  Recent Labs  04/01/17 0445 04/02/17 0622  WBC 5.4 5.1  RBC 3.14* 3.32*  HCT 29.0* 30.1*  PLT 162 175    Recent Labs  04/01/17 0445 04/02/17 0622  NA 140 142  K 3.8 3.8  CL 107 104  CO2 27 29  BUN 13 15  CREATININE 0.72 0.77  GLUCOSE 110* 106*  CALCIUM  8.4* 9.0   No results for input(s): LABPT, INR in the last 72 hours.  Review of Systems: ROS  Physical Exam: Neurologically intact ABD soft Sensation intact distally Compartment soft  Mild cellulitis noted on left breast Pain elicited with internal and external rotation of her left hip No pain to palpation of the left lower extremity, lateral hip or left buttock  Assessment and Plan: MRI has been performed Dr. Rolena Infante recommended left hip aspiration which was done today Pt will continue to be monitored by Internal medicine   Ronette Deter PAC for Melina Schools, MD [facility] [phone]  Aspirate fluid noted to be turbid, 97 neutrophils, no crystals (unable to perform WBC) Patient with approximately 4-5 days of progressive hip pain Admitted last week for cellulitis Called yesterday to evaluate  for septic hip Given pain with ROM and aspirate I do have concerns for infection I do not have experience with surgical management of septic hip and so I have asked my partner Dr Lyla Glassing to evaluate and determine best treatment plan.   Remain NPO for now

## 2017-04-02 NOTE — Care Management Important Message (Signed)
Important Message  Patient Details  Name: Briana Smith MRN: [id number] Date of Birth: 1941/08/24   Medicare Important Message Given:  Yes    Kerin Salen 04/02/2017, 11:18 AMImportant Message  Patient Details  Name: Briana Smith MRN: [id number] Date of Birth: 1941/08/12   Medicare Important Message Given:  Yes    Kerin Salen 04/02/2017, 11:18 AM

## 2017-04-02 NOTE — Progress Notes (Signed)
Patient ID: Briana Smith, female   DOB: 10/27/1940, 76 y.o.   MRN: [id number]  PROGRESS NOTE    MARIKO NOWAKOWSKI  QJJ:[id number] DOB: [birth year] DOA: 03/28/2017 PCP: [name], MD   Brief Narrative:  76 year female with history of left breast cancer status post lumpectomy and chemoradiation, left breast cellulitis about 2 years ago and hypertension presented with left breast redness and pain and was admitted with cellulitis on intravenous antibiotics. She also is having severe left  thigh pain   Assessment & Plan:   Principal Problem:   Cellulitis of left breast Active Problems:   Essential hypertension   History of breast cancer   Cellulitis of breast   Cellulitis of the left breast - she received iv vancomycin since admission. Cultures negative so far, mrsa screening negative -Breast ultrasound did not show any abscess -improving, d/c vanc , changed to iv ancef  Leukocytosis Wbc 17.4 on admission - Probably secondary to cellulitis; resolved  History of breast cancer -Status post lumpectomy, chemotherapy and radiation. Report last memogram in 10/2016 which is unremarkable  Essential hypertension -She is on lisinopril and HCTZ. Continued - Blood pressure controlled  Nausea and vomiting -Probably from cellulitis. - Resolved  Left hip and thigh pain, acute - Probably musculoskeletal; still having significant thigh pain.  - Continue current dose of Flexeril, continue oral opiate and when necessary morphine. Add ibuprofen scheduled doses for anti-inflammatory properties. - X-ray of the left hip was unremarkable and lower extremity duplex ultrasound was negative for DVT - CT of the left femur was negative for any fractures - PT eval - she still has significant pain, not able to bear weight, MRI left hip concerns for left hip joint effusion,  [place] orth[facility] Dr Rolena Infante consulted, he recommend left hip arthrocentesis by IR, he will follow -IR consulted    Thrombocytopenia - Questionable cause. Resolved  Constipation: increase stool regimen  DVT prophylaxis:SQ Heparin Code Status:Full code Family Communication:Discussed with husband present at bedside Disposition Plan:pending Consultants: [facility] orthopedics Dr Rolena Infante Interventional radiology  Procedures: lower extremity duplex ultrasound on 03/29/2017: Negative for DVT Left hip arthrocentesis on 6/28  Antimicrobials: Vancomycin from 03/28/2017 to 6/27 Ancef from 6/27  Subjective: She report left breast is improving, But she is irritated due to severe left thigh pain.  No fever Husband at bedside  Objective: Vitals:   04/01/17 1437 04/01/17 2106 04/02/17 0553 04/02/17 0926  BP: (!) 108/52 122/60 135/66 131/61  Pulse: 73 68 70 80  Resp: 18 18 18    Temp: 98.4 F (36.9 C) 98.3 F (36.8 C) 97.9 F (36.6 C) 98.1 F (36.7 C)  TempSrc: Oral Oral Oral Oral  SpO2: 98% 100% 97% 98%  Weight:      Height:        Intake/Output Summary (Last 24 hours) at 04/02/17 1005 Last data filed at 04/02/17 0900  Gross per 24 hour  Intake              530 ml  Output                0 ml  Net              530 ml   Filed Weights   03/28/17 0743  Weight: 75.8 kg (167 lb)    Examination:  General exam: Appears calm and comfortable  Respiratory system: Bilateral decreased breath sound at bases Cardiovascular system: S1 & S2 heard, Rate controlled  Gastrointestinal system: Abdomen is nondistended, soft and  nontender. Normal bowel sounds heard. Extremities: Left thigh mildly tender. No cyanosis, clubbing, edema Skin: left breast erythema has much improved, still has some induration under the breast at about 6 o'clock region,  Mild tender, no drainage Overall continue to improve  Data Reviewed: I have personally reviewed following labs and imaging studies  CBC:  Recent Labs Lab 03/28/17 0831 03/29/17 0722 03/30/17 0522 03/31/17 1054 04/01/17 0445 04/02/17 0622   WBC 17.4* 9.2 7.3 7.2 5.4 5.1  NEUTROABS 16.1*  --   --   --   --   --   HGB 12.2 10.5* 9.7* 10.6* 9.5* 9.8*  HCT 36.6 32.3* 29.4* 31.7* 29.0* 30.1*  MCV 91.3 93.1 93.3 91.9 92.4 90.7  PLT 172 135* 136* 156 162 625   Basic Metabolic Panel:  Recent Labs Lab 03/29/17 0722 03/30/17 0522 03/31/17 1054 04/01/17 0445 04/02/17 0622  NA 136 138 136 140 142  K 3.6 3.8 4.2 3.8 3.8  CL 105 104 103 107 104  CO2 26 26 25 27 29   GLUCOSE 133* 115* 149* 110* 106*  BUN 16 13 10 13 15   CREATININE 0.67 0.69 0.74 0.72 0.77  CALCIUM 8.1* 8.5* 8.6* 8.4* 9.0  MG  --   --  2.2 2.0  --    GFR: Estimated Creatinine Clearance: 53.3 mL/min (by C-G formula based on SCr of 0.77 mg/dL). Liver Function Tests: No results for input(s): AST, ALT, ALKPHOS, BILITOT, PROT, ALBUMIN in the last 168 hours. No results for input(s): LIPASE, AMYLASE in the last 168 hours. No results for input(s): AMMONIA in the last 168 hours. Coagulation Profile:  Recent Labs Lab 03/28/17 1214  INR 0.98   Cardiac Enzymes: No results for input(s): CKTOTAL, CKMB, CKMBINDEX, TROPONINI in the last 168 hours. BNP (last 3 results) No results for input(s): PROBNP in the last 8760 hours. HbA1C: No results for input(s): HGBA1C in the last 72 hours. CBG: No results for input(s): GLUCAP in the last 168 hours. Lipid Profile: No results for input(s): CHOL, HDL, LDLCALC, TRIG, CHOLHDL, LDLDIRECT in the last 72 hours. Thyroid Function Tests: No results for input(s): TSH, T4TOTAL, FREET4, T3FREE, THYROIDAB in the last 72 hours. Anemia Panel: No results for input(s): VITAMINB12, FOLATE, FERRITIN, TIBC, IRON, RETICCTPCT in the last 72 hours. Sepsis Labs: No results for input(s): PROCALCITON, LATICACIDVEN in the last 168 hours.  Recent Results (from the past 240 hour(s))  MRSA PCR Screening     Status: None   Collection Time: 04/01/17  8:43 AM  Result Value Ref Range Status   MRSA by PCR NEGATIVE NEGATIVE Final    Comment:         The GeneXpert MRSA Assay (FDA approved for NASAL specimens only), is one component of a comprehensive MRSA colonization surveillance program. It is not intended to diagnose MRSA infection nor to guide or monitor treatment for MRSA infections.          Radiology Studies: Mr Hip Left Wo Contrast  Result Date: 04/02/2017 CLINICAL DATA:  Left hip pain. EXAM: MR OF THE LEFT HIP WITHOUT CONTRAST TECHNIQUE: Multiplanar, multisequence MR imaging was performed. No intravenous contrast was administered. COMPARISON:  CT left hip 03/30/2017 FINDINGS: Bones: No hip fracture, dislocation or avascular necrosis. No periosteal reaction or bone destruction. No aggressive osseous lesion. Normal sacrum and sacroiliac joints. No SI joint widening or erosive changes. Disc desiccation at L5-S1. Articular cartilage and labrum Articular cartilage:  No chondral defect. Labrum: Grossly intact, but evaluation is limited by lack of intraarticular  fluid. Joint or bursal effusion Joint effusion: Small left hip joint effusion. No right hip joint effusion. No SI joint effusion. Bursae:  No bursa formation. Muscles and tendons Flexors: Normal. Extensors: Normal. Abductors: Mild muscle edema within the left operator externus, pectineus and adductor brevis muscles most concerning for muscle strain versus mild myositis. No tendon tear. Adductors: Normal. Gluteals: Mild muscle edema in the right gluteus minimus muscle. Mild muscle edema in the left gluteus minimus muscle. Mild edema surrounding the proximal left vastus lateralis muscle. Hamstrings: Normal. Other findings Miscellaneous: Small amount of pelvic free fluid. Prior hysterectomy. No fluid collection or hematoma. No inguinal lymphadenopathy. No inguinal hernia. IMPRESSION: 1. Mild muscle edema within the bilateral gluteus minimus muscles, left obturator externus, left pectineus and left adductor brevis muscles most concerning for muscle strain versus mild myositis versus  reactive inflammatory changes secondary to left hip joint effusion. 2. Nonspecific left hip joint effusion. If there is concern regarding septic arthritis, recommend arthrocentesis. 3. Electronically Signed   By: Kathreen Devoid   On: 04/02/2017 08:32        Scheduled Meds: . aspirin EC  81 mg Oral Daily  . bisacodyl  10 mg Rectal Daily  . heparin  5,000 Units Subcutaneous Q8H  . lisinopril  10 mg Oral Daily   And  . hydrochlorothiazide  6.25 mg Oral Daily  . ibuprofen  400 mg Oral TID  . multivitamin with minerals  1 tablet Oral Daily  . polyethylene glycol  17 g Oral BID  . pyridoxine  100 mg Oral Daily  . senna-docusate  2 tablet Oral BID  . timolol  1 drop Both Eyes BID   Continuous Infusions: .  ceFAZolin (ANCEF) IV Stopped (04/02/17 0421)     LOS: 4 days     Time spent> 43mins   Alithia Zavaleta, MD PhD [facility] Pager [phone]  If 7PM-7AM, please contact night-coverage [email or web address] Password [email or web address] 04/02/2017, 10:05 AM

## 2017-04-03 ENCOUNTER — Inpatient Hospital Stay (HOSPITAL_COMMUNITY): Payer: Medicare HMO | Admitting: Certified Registered Nurse Anesthetist

## 2017-04-03 ENCOUNTER — Encounter (HOSPITAL_COMMUNITY): Payer: Self-pay | Admitting: Anesthesiology

## 2017-04-03 ENCOUNTER — Encounter (HOSPITAL_COMMUNITY)
Admission: EM | Disposition: A | Discharge status: Home Health | Payer: Self-pay | Source: Home / Self Care | Attending: Internal Medicine

## 2017-04-03 DIAGNOSIS — M25452 Effusion, left hip: Secondary | ICD-10-CM

## 2017-04-03 DIAGNOSIS — M009 Pyogenic arthritis, unspecified: Secondary | ICD-10-CM | POA: Diagnosis present

## 2017-04-03 DIAGNOSIS — M00852 Arthritis due to other bacteria, left hip: Secondary | ICD-10-CM

## 2017-04-03 HISTORY — PX: TOTAL HIP ARTHROPLASTY: SHX124

## 2017-04-03 LAB — BASIC METABOLIC PANEL
ANION GAP: 8 (ref 5–15)
BUN: 13 mg/dL (ref 6–20)
CALCIUM: 8.8 mg/dL — AB (ref 8.9–10.3)
CHLORIDE: 106 mmol/L (ref 101–111)
CO2: 27 mmol/L (ref 22–32)
CREATININE: 0.71 mg/dL (ref 0.44–1.00)
GFR calc non Af Amer: >60 mL/min (ref >60)
GLUCOSE: 109 mg/dL — AB (ref 65–99)
Potassium: 3.8 mmol/L (ref 3.5–5.1)
Sodium: 141 mmol/L (ref 135–145)

## 2017-04-03 LAB — CBC
HCT: 28.9 % — ABNORMAL LOW (ref 36.0–46.0)
HEMOGLOBIN: 9.5 g/dL — AB (ref 12.0–15.0)
MCH: 29.9 pg (ref 26.0–34.0)
MCHC: 32.9 g/dL (ref 30.0–36.0)
MCV: 90.9 fL (ref 78.0–100.0)
Platelets: 189 10*3/uL (ref 150–400)
RBC: 3.18 MIL/uL — AB (ref 3.87–5.11)
RDW: 14.2 % (ref 11.5–15.5)
WBC: 4.8 10*3/uL (ref 4.0–10.5)

## 2017-04-03 SURGERY — ARTHROPLASTY, HIP, TOTAL, ANTERIOR APPROACH
Anesthesia: General | Site: Hip | Laterality: Left

## 2017-04-03 MED ORDER — ONDANSETRON HCL 4 MG/2ML IJ SOLN
INTRAMUSCULAR | Status: AC
  Filled 2017-04-03: qty 2

## 2017-04-03 MED ORDER — FENTANYL CITRATE (PF) 100 MCG/2ML IJ SOLN
INTRAMUSCULAR | Status: AC
  Filled 2017-04-03: qty 2

## 2017-04-03 MED ORDER — SODIUM CHLORIDE 0.9 % IR SOLN
Status: DC | PRN
  Administered 2017-04-03 (×2): 3000 mL
  Administered 2017-04-03: 1000 mL

## 2017-04-03 MED ORDER — KETOROLAC TROMETHAMINE 30 MG/ML IJ SOLN
INTRAMUSCULAR | Status: AC
  Filled 2017-04-03: qty 1

## 2017-04-03 MED ORDER — FENTANYL CITRATE (PF) 100 MCG/2ML IJ SOLN
INTRAMUSCULAR | Status: DC | PRN
  Administered 2017-04-03: 50 ug via INTRAVENOUS
  Administered 2017-04-03: 100 ug via INTRAVENOUS
  Administered 2017-04-03 (×2): 50 ug via INTRAVENOUS
  Administered 2017-04-03 (×2): 25 ug via INTRAVENOUS

## 2017-04-03 MED ORDER — LIDOCAINE 2% (20 MG/ML) 5 ML SYRINGE
INTRAMUSCULAR | Status: DC | PRN
  Administered 2017-04-03: 50 mg via INTRAVENOUS

## 2017-04-03 MED ORDER — DEXAMETHASONE SODIUM PHOSPHATE 10 MG/ML IJ SOLN
INTRAMUSCULAR | Status: AC
  Filled 2017-04-03: qty 1

## 2017-04-03 MED ORDER — METOCLOPRAMIDE HCL 5 MG/ML IJ SOLN: 5.0000 mg | Freq: Three times a day (TID) | INTRAMUSCULAR | Status: DC | PRN

## 2017-04-03 MED ORDER — PROPOFOL 10 MG/ML IV BOLUS
INTRAVENOUS | Status: DC | PRN
  Administered 2017-04-03: 90 mg via INTRAVENOUS
  Administered 2017-04-03: 100 mg via INTRAVENOUS

## 2017-04-03 MED ORDER — DEXAMETHASONE SODIUM PHOSPHATE 10 MG/ML IJ SOLN
INTRAMUSCULAR | Status: DC | PRN
  Administered 2017-04-03: 10 mg via INTRAVENOUS

## 2017-04-03 MED ORDER — POVIDONE-IODINE 10 % EX SWAB: 2.0000 "application " | Freq: Once | CUTANEOUS | Status: DC

## 2017-04-03 MED ORDER — ONDANSETRON HCL 4 MG/2ML IJ SOLN
INTRAMUSCULAR | Status: DC | PRN
  Administered 2017-04-03: 4 mg via INTRAVENOUS

## 2017-04-03 MED ORDER — BUPIVACAINE-EPINEPHRINE (PF) 0.25% -1:200000 IJ SOLN
INTRAMUSCULAR | Status: AC
  Filled 2017-04-03: qty 30

## 2017-04-03 MED ORDER — CHLORHEXIDINE GLUCONATE 4 % EX LIQD
60.0000 mL | Freq: Once | CUTANEOUS | Status: DC
  Filled 2017-04-03: qty 60

## 2017-04-03 MED ORDER — ISOPROPYL ALCOHOL 70 % SOLN
Status: DC | PRN
  Administered 2017-04-03: 1 via TOPICAL

## 2017-04-03 MED ORDER — ASPIRIN EC 81 MG PO TBEC
81.0000 mg | DELAYED_RELEASE_TABLET | Freq: Every day | ORAL | Status: DC
  Administered 2017-04-04 – 2017-04-06 (×3): 81 mg via ORAL
  Filled 2017-04-03 (×3): qty 1

## 2017-04-03 MED ORDER — HYDROMORPHONE HCL 2 MG/ML IJ SOLN
0.2500 mg | INTRAMUSCULAR | Status: DC | PRN
  Administered 2017-04-03 (×5): 0.5 mg via INTRAVENOUS

## 2017-04-03 MED ORDER — SUCCINYLCHOLINE CHLORIDE 200 MG/10ML IV SOSY
PREFILLED_SYRINGE | INTRAVENOUS | Status: DC | PRN
  Administered 2017-04-03: 120 mg via INTRAVENOUS

## 2017-04-03 MED ORDER — VANCOMYCIN HCL IN DEXTROSE 1-5 GM/200ML-% IV SOLN
1000.0000 mg | Freq: Two times a day (BID) | INTRAVENOUS | Status: DC
  Administered 2017-04-03 – 2017-04-06 (×6): 1000 mg via INTRAVENOUS
  Filled 2017-04-03 (×4): qty 200

## 2017-04-03 MED ORDER — PROMETHAZINE HCL 25 MG/ML IJ SOLN: 6.2500 mg | INTRAMUSCULAR | Status: DC | PRN

## 2017-04-03 MED ORDER — PROPOFOL 10 MG/ML IV BOLUS
INTRAVENOUS | Status: AC
  Filled 2017-04-03: qty 20

## 2017-04-03 MED ORDER — VANCOMYCIN HCL IN DEXTROSE 1-5 GM/200ML-% IV SOLN
INTRAVENOUS | Status: AC
  Filled 2017-04-03: qty 200

## 2017-04-03 MED ORDER — BUPIVACAINE-EPINEPHRINE 0.25% -1:200000 IJ SOLN
INTRAMUSCULAR | Status: DC | PRN
  Administered 2017-04-03: 30 mL

## 2017-04-03 MED ORDER — ENOXAPARIN SODIUM 40 MG/0.4ML ~~LOC~~ SOLN
40.0000 mg | SUBCUTANEOUS | Status: DC
  Administered 2017-04-04 – 2017-04-06 (×3): 40 mg via SUBCUTANEOUS
  Filled 2017-04-03 (×3): qty 0.4

## 2017-04-03 MED ORDER — LACTATED RINGERS IV SOLN
INTRAVENOUS | Status: DC | PRN
  Administered 2017-04-03: 18:00:00 via INTRAVENOUS

## 2017-04-03 MED ORDER — HYDROMORPHONE HCL 2 MG/ML IJ SOLN
INTRAMUSCULAR | Status: AC
Start: 2017-04-03 — End: 2017-04-04
  Filled 2017-04-03: qty 1

## 2017-04-03 MED ORDER — KETOROLAC TROMETHAMINE 30 MG/ML IJ SOLN
INTRAMUSCULAR | Status: DC | PRN
  Administered 2017-04-03: 30 mg

## 2017-04-03 MED ORDER — HYDROMORPHONE HCL 2 MG/ML IJ SOLN
INTRAMUSCULAR | Status: AC
  Filled 2017-04-03: qty 1

## 2017-04-03 MED ORDER — METOCLOPRAMIDE HCL 5 MG PO TABS: 5.0000 mg | ORAL_TABLET | Freq: Three times a day (TID) | ORAL | Status: DC | PRN

## 2017-04-03 MED ORDER — ACETAMINOPHEN 10 MG/ML IV SOLN
INTRAVENOUS | Status: AC
  Administered 2017-04-03: 1000 mg
  Filled 2017-04-03: qty 100

## 2017-04-03 SURGICAL SUPPLY — 45 items
ADH SKN CLS APL DERMABOND .7 (GAUZE/BANDAGES/DRESSINGS) ×1
BAG DECANTER FOR FLEXI CONT (MISCELLANEOUS) IMPLANT
BAG SPEC THK2 15X12 ZIP CLS (MISCELLANEOUS)
BAG ZIPLOCK 12X15 (MISCELLANEOUS) IMPLANT
CHLORAPREP W/TINT 26ML (MISCELLANEOUS) ×2 IMPLANT
CLOTH BEACON ORANGE TIMEOUT ST (SAFETY) ×2 IMPLANT
COVER PERINEAL POST (MISCELLANEOUS) ×2 IMPLANT
COVER SURGICAL LIGHT HANDLE (MISCELLANEOUS) ×2 IMPLANT
DECANTER SPIKE VIAL GLASS SM (MISCELLANEOUS) ×2 IMPLANT
DERMABOND ADVANCED (GAUZE/BANDAGES/DRESSINGS) ×1
DERMABOND ADVANCED .7 DNX12 (GAUZE/BANDAGES/DRESSINGS) ×1 IMPLANT
DRAPE SHEET LG 3/4 BI-LAMINATE (DRAPES) ×6 IMPLANT
DRAPE STERI IOBAN 125X83 (DRAPES) ×2 IMPLANT
DRAPE U-SHAPE 47X51 STRL (DRAPES) ×4 IMPLANT
DRSG AQUACEL AG ADV 3.5X10 (GAUZE/BANDAGES/DRESSINGS) ×2 IMPLANT
ELECT PENCIL ROCKER SW 15FT (MISCELLANEOUS) ×2 IMPLANT
ELECT REM PT RETURN 15FT ADLT (MISCELLANEOUS) ×2 IMPLANT
GAUZE SPONGE 4X4 12PLY STRL (GAUZE/BANDAGES/DRESSINGS) ×2 IMPLANT
GLOVE BIO SURGEON STRL SZ8.5 (GLOVE) ×4 IMPLANT
GLOVE BIOGEL PI IND STRL 8.5 (GLOVE) ×1 IMPLANT
GLOVE BIOGEL PI INDICATOR 8.5 (GLOVE) ×1
GOWN SPEC L3 XXLG W/TWL (GOWN DISPOSABLE) ×2 IMPLANT
HANDPIECE INTERPULSE COAX TIP (DISPOSABLE) ×2
HOLDER FOLEY CATH W/STRAP (MISCELLANEOUS) ×2 IMPLANT
HOOD PEEL AWAY FLYTE STAYCOOL (MISCELLANEOUS) ×4 IMPLANT
MARKER SKIN DUAL TIP RULER LAB (MISCELLANEOUS) ×2 IMPLANT
NEEDLE SPNL 18GX3.5 QUINCKE PK (NEEDLE) ×2 IMPLANT
PACK ANTERIOR HIP CUSTOM (KITS) ×2 IMPLANT
SAW OSC TIP CART 19.5X105X1.3 (SAW) ×2 IMPLANT
SEALER BIPOLAR AQUA 6.0 (INSTRUMENTS) ×2 IMPLANT
SET HNDPC FAN SPRY TIP SCT (DISPOSABLE) ×1 IMPLANT
STAPLER VISISTAT 35W (STAPLE) ×2 IMPLANT
SUT ETHIBOND NAB CT1 #1 30IN (SUTURE) ×4 IMPLANT
SUT MNCRL AB 3-0 PS2 18 (SUTURE) ×2 IMPLANT
SUT MON AB 2-0 CT1 36 (SUTURE) ×4 IMPLANT
SUT STRATAFIX PDO 1 14 VIOLET (SUTURE) ×2
SUT STRATFX PDO 1 14 VIOLET (SUTURE) ×1
SUT VIC AB 2-0 CT1 27 (SUTURE) ×2
SUT VIC AB 2-0 CT1 TAPERPNT 27 (SUTURE) ×1 IMPLANT
SUTURE STRATFX PDO 1 14 VIOLET (SUTURE) ×1 IMPLANT
SWAB COLLECTION DEVICE MRSA (MISCELLANEOUS) ×2 IMPLANT
SYR 50ML LL SCALE MARK (SYRINGE) ×2 IMPLANT
TRAY FOLEY W/METER SILVER 16FR (SET/KITS/TRAYS/PACK) IMPLANT
WATER STERILE IRR 1500ML POUR (IV SOLUTION) ×2 IMPLANT
YANKAUER SUCT BULB TIP 10FT TU (MISCELLANEOUS) ×2 IMPLANT

## 2017-04-03 NOTE — Discharge Instructions (Signed)
Dr. Rod Can Adult Hip & Knee Specialist [facility] [address], [place], [place] [address] [phone]   POSTOPERATIVE DIRECTIONS    Hip Rehabilitation, Guidelines Following Surgery   WEIGHT BEARING Weight bearing as tolerated with assist device (walker, cane, etc) as directed, use it as long as suggested by your surgeon or therapist, typically at least 4-6 weeks.   HOME CARE INSTRUCTIONS  Remove items at home which could result in a fall. This includes throw rugs or furniture in walking pathways.  Continue medications as instructed at time of discharge.  You may have some home medications which will be placed on hold until you complete the course of blood thinner medication.  Your dressing is waterproof. He may begin taking showers immediately after discharge. No soaking or submerging the dressing. Do not put on socks or shoes without following the instructions of your caregivers.   Sit on chairs with arms. Use the chair arms to help push yourself up when arising.   Walk with walker as instructed.  You may resume a sexual relationship in one month or when given the OK by your caregiver.  Use walker as long as suggested by your caregivers.  Avoid periods of inactivity such as sitting longer than an hour when not asleep. This helps prevent blood clots.  You may return to work once you are cleared by Engineer, production.   Do not drive while taking narcotics.  Wear elastic stockings for two weeks following surgery during the day but you may remove then at night.  Make sure you keep all of your appointments after your operation with all of your doctors and caregivers. You should call the office at the above phone number and make an appointment for approximately two weeks after the date of your surgery. Please pick up a stool softener and laxative for home use as long as you are requiring pain medications.  ICE to the affected hip every three hours for  30 minutes at a time and then as needed for pain and swelling. Continue to use ice on the hip for pain and swelling from surgery. You may notice swelling that will progress down to the foot and ankle.  This is normal after surgery.  Elevate the leg when you are not up walking on it.   It is important for you to complete the blood thinner medication as prescribed by your doctor.  Continue to use the breathing machine which will help keep your temperature down.  It is common for your temperature to cycle up and down following surgery, especially at night when you are not up moving around and exerting yourself.  The breathing machine keeps your lungs expanded and your temperature down.  RANGE OF MOTION AND STRENGTHENING EXERCISES  These exercises are designed to help you keep full movement of your hip joint. Follow your caregiver's or physical therapist's instructions. Perform all exercises about fifteen times, three times per day or as directed. Exercise both hips, even if you have had only one joint replacement. These exercises can be done on a training (exercise) mat, on the floor, on a table or on a bed. Use whatever works the best and is most comfortable for you. Use music or television while you are exercising so that the exercises are a pleasant break in your day. This will make your life better with the exercises acting as a break in routine you can look forward to.  Lying on your back, slowly slide your foot  toward your buttocks, raising your knee up off the floor. Then slowly slide your foot back down until your leg is straight again.  Lying on your back spread your legs as far apart as you can without causing discomfort.  Lying on your side, raise your upper leg and foot straight up from the floor as far as is comfortable. Slowly lower the leg and repeat.  Lying on your back, tighten up the muscle in the front of your thigh (quadriceps muscles). You can do this by keeping your leg straight and  trying to raise your heel off the floor. This helps strengthen the largest muscle supporting your knee.  Lying on your back, tighten up the muscles of your buttocks both with the legs straight and with the knee bent at a comfortable angle while keeping your heel on the floor.   SKILLED REHAB INSTRUCTIONS: If the patient is transferred to a skilled rehab facility following release from the hospital, a list of the current medications will be sent to the facility for the patient to continue.  When discharged from the skilled rehab facility, please have the facility set up the patient's [place] prior to being released. Also, the skilled facility will be responsible for providing the patient with their medications at time of release from the facility to include their pain medication and their blood thinner medication. If the patient is still at the rehab facility at time of the two week follow up appointment, the skilled rehab facility will also need to assist the patient in arranging follow up appointment in our office and any transportation needs.  MAKE SURE YOU:  Understand these instructions.  Will watch your condition.  Will get help right away if you are not doing well or get worse.  Pick up stool softner and laxative for home use following surgery while on pain medications. Your dressing is waterproof. Do not remove the dressing. You may take showers. No soaking or submerging the dressing. Continue to use ice for pain and swelling after surgery. Do not use any lotions or creams on the incision until instructed by your surgeon.

## 2017-04-03 NOTE — Progress Notes (Signed)
Pharmacy Antibiotic Note  NIKIA LEVELS is a 76 y.o. female admitted on 03/28/2017 with with left breast cellulitis. Over the coarse of admission pt has reported significant left groin pain.  ID suspects setpic arthritis. Pharmacy has been consulted for vancomycin dosing.    Scr 0.71, CrCl ~ 56mls/min  Plan: Based on previous vanc levels will start Vancomycin 1000mg  IV q12h Follow renal function and cultures vanc at steady state  Height: 5' 0.5" (153.7 cm) Weight: 167 lb (75.8 kg) IBW/kg (Calculated) : 46.65  Temp (24hrs), Avg:98.5 F (36.9 C), Min:97.9 F (36.6 C), Max:99 F (37.2 C)   Recent Labs Lab 03/30/17 0522 03/31/17 1054 04/01/17 0445 04/02/17 0622 04/03/17 0550  WBC 7.3 7.2 5.4 5.1 4.8  CREATININE 0.69 0.74 0.72 0.77 0.71  VANCOTROUGH  --  4*  --   --   --     Estimated Creatinine Clearance: 53.3 mL/min (by C-G formula based on SCr of 0.71 mg/dL).    No Known Allergies  Antimicrobials this admission: 6/23 vanc >>6/26 >>resumed 6/29>> 6/26 ancef>>6/29  Dose adjustments this admission: 6/26 VT=4 (drawn 2 hours late) on 1250mg  IV q24h  Microbiology results: 6/28 synovial fluid: sent 6/29  BCx: sent 6/27 MRSA PCR: neg  Thank you for allowing pharmacy to be a part of this patient's care.  Dolly Rias RPh 04/03/2017, 1:51 PM Pager [phone]

## 2017-04-03 NOTE — Progress Notes (Signed)
[facility] Infusion Coordinator will follow patients hospital course with the ID team to support possible home IV ABX.   If patient discharges after hours, please call [phone].   Larry Sierras 04/03/2017, 12:58 PM

## 2017-04-03 NOTE — Progress Notes (Signed)
[facility] Infusion Coordinator will follow Briana Smith for possible long term IV ABX at DC.   If patient discharges after hours, please call [phone].   Larry Sierras 04/03/2017, 1:12 PM

## 2017-04-03 NOTE — Progress Notes (Signed)
I was asked to assume care of the patient by my partner, Dr. Rolena Infante. Patient was admitted to the hospital 6 days ago for cellulitis of the left breast and progressive left hip pain. She has been on IV antibiotics since the time of admission. Yesterday, an MRI of the hip was obtained showing a joint effusion. Subsequently, interventional radiology aspirated the left hip. A couple of cc of turbid joint fluid was obtained. Cell count was not performed due to clotted specimen. There were 97% neutrophils. Gram stain was negative. On exam, the skin over her hip is normal. There is no significant tenderness to palpation. She does have severe pain with passive range of motion of the left hip. I feel there is a high likelihood that the patient has septic arthritis of the left hip. Obviously, the fact that she has received IV antibiotics for the last 6 days makes interpreting the synovial fluid analysis difficult. Her cultures may always be negative. Nonetheless, the risk of not performing an operative irrigation and debridement of the hip is significant. Therefore, I recommend irrigation and debridement of the left hip. Infectious Disease is on board. She is going to likely need a PICC line and several weeks of IV antibiotics. She understands that the risks include bleeding, infection, damage to blood vessels and nerves, reoperation, recurrence of the infection, arthritis, stiffness, and the risk of anesthesia (death). She understands these risks and wishes to proceed.

## 2017-04-03 NOTE — Transfer of Care (Signed)
Immediate Anesthesia Transfer of Care Note  Patient: Briana Smith  Procedure(s) Performed: Procedure(s): IRRIGATION AND DEBRIDIMENT LEFT HIP (Left)  Patient Location: PACU  Anesthesia Type:General  Level of Consciousness:  sedated, patient cooperative and responds to stimulation  Airway & Oxygen Therapy:Patient Spontanous Breathing and Patient connected to face mask oxgen  Post-op Assessment:  Report given to PACU RN and Post -op Vital signs reviewed and stable  Post vital signs:  Reviewed and stable  Last Vitals:  Vitals:   04/03/17 0512 04/03/17 1523  BP: 127/66 (!) 147/66  Pulse: (!) 58 80  Resp: 16 18  Temp: 36.6 C 05.1 C    Complications: No apparent anesthesia complications

## 2017-04-03 NOTE — Consult Note (Signed)
[facility] for Infectious Disease    Date of Admission:  03/28/2017   Total days of antibiotics 7        Day 3 cefazolin               Reason for Consult: Probable septic left hip    Referring Provider: Dr. Florencia Reasons  Assessment: I think septic arthritis is highly likely. Is quite possible that her aspirate cultures will remain negative since she has been on antibiotics. I suspect that she had bacteremic seeding of the hip related to her left breast cellulitis. Staph and strep are the most likely culprits. Although her MRSA PCR is negative I think it would be best to switch back to IV vancomycin to cover for the possibility of all staph and strep. We can adjust antibiotic therapy based on final culture results.She will need several weeks of IV antibiotics as a minimum. I will go and asked to have a PICC placed.   Plan: 1. Change cefazolin back to vancomycin pending final cultures 2. PICC placement 3. Await orthopedic evaluation by Dr. Lyla Glassing   Principal Problem:   Septic arthritis of hip [facility]) Active Problems:   Cellulitis of left breast   Essential hypertension   History of breast cancer   . [START ON 04/04/2017] aspirin EC  81 mg Oral Daily  . bisacodyl  10 mg Rectal Daily  . lisinopril  10 mg Oral Daily   And  . hydrochlorothiazide  6.25 mg Oral Daily  . ibuprofen  400 mg Oral TID  . mineral oil  1 enema Rectal Once  . multivitamin with minerals  1 tablet Oral Daily  . polyethylene glycol  17 g Oral BID  . pyridoxine  100 mg Oral Daily  . senna-docusate  2 tablet Oral BID  . timolol  1 drop Both Eyes BID    HPI: Briana Smith is a 76 y.o. female With a history of left breast cancer. She underwent lumpectomy, chemotherapy and radiation therapy little over 2 years ago. She was hospitalized 2 years ago with left breast cellulitis that resolved on broad empiric antibiotic therapy. She recently had sudden onset of fever, chills, sweats, pain and redness of her  left breast leading to admission on 03/28/2017. She was afebrile on admission but did have leukocytosis with a white blood cell count of 17,400. She was started on vancomycin and begin to improve fairly quickly. Her MRSA PCR was negative and vancomycin was changed to cefazolin 2 days ago.  Right at the same time she began to have symptoms of left breast cellulitis she started having left groin pain. She has not had any recent injury to her leg. The pain was severe enough that it made moving her leg and standing and walking very difficult. MRI showed a left hip effusion. Turbid bloody fluid was aspirated yesterday. The specimen clotted so no white blood cell count could be obtained. There was a predominance of segmented neutrophils however. No crystals were seen. No organisms were seen on Gram stain and culture is negative so far.   Review of Systems: Review of Systems  Constitutional: Positive for chills, diaphoresis, fever and malaise/fatigue. Negative for weight loss.  HENT: Negative for congestion and sore throat.   Respiratory: Negative for cough, sputum production and shortness of breath.   Cardiovascular: Negative for chest pain.  Gastrointestinal: Negative for abdominal pain, diarrhea, heartburn, nausea and vomiting.  Genitourinary: Negative for dysuria and frequency.  Musculoskeletal: Positive for joint pain. Negative for myalgias.  Skin: Negative for rash.       Left breast cellulitis is noted in history of present illness.  Neurological: Negative for dizziness and headaches.    Past Medical History:  Diagnosis Date  . Cancer [facility])    breast cancer- left  . Heart murmur    years ago. no recent issues  . Hyperlipidemia   . Hypertension     Social History  Substance Use Topics  . Smoking status: Never Smoker  . Smokeless tobacco: Never Used  . Alcohol use No    Family History  Problem Relation Age of Onset  . COPD Father   . Diabetes Mother   . Dementia Mother   .  Kidney disease Mother        not on dialysis  . Cancer Brother        jaw  . Hypertension Brother    No Known Allergies  OBJECTIVE: Blood pressure 127/66, pulse (!) 58, temperature 97.9 F (36.6 C), temperature source Oral, resp. rate 16, height 5' 0.5" (1.537 m), weight 167 lb (75.8 kg), SpO2 97 %.  Physical Exam  Constitutional: She is oriented to person, place, and time.  She is slightly despondent given the recent health problems but otherwise in no distress.  Cardiovascular: Normal rate and regular rhythm.   No murmur heard. Pulmonary/Chest: Effort normal and breath sounds normal.  Abdominal: Soft. She exhibits no distension. There is no tenderness.  Musculoskeletal:  She has pain in her left hip with any range of motion.  Neurological: She is alert and oriented to person, place, and time.  Skin:  There is only faint residual pinkness on her left breast. There is no warmth or tenderness.  Psychiatric: Mood and affect normal.    Lab Results Lab Results  Component Value Date   WBC 4.8 04/03/2017   HGB 9.5 (L) 04/03/2017   HCT 28.9 (L) 04/03/2017   MCV 90.9 04/03/2017   PLT 189 04/03/2017    Lab Results  Component Value Date   CREATININE 0.71 04/03/2017   BUN 13 04/03/2017   NA 141 04/03/2017   K 3.8 04/03/2017   CL 106 04/03/2017   CO2 27 04/03/2017    Lab Results  Component Value Date   ALT 17 01/31/2016   AST 15 01/31/2016   ALKPHOS 45 01/31/2016   BILITOT 0.33 01/31/2016    Sed Rate (mm/hr)  Date Value  03/30/2017 83 (H)  01/19/2009 34 (H)   CRP (mg/dL)  Date Value  04/01/2017 14.2 (H)  03/30/2017 19.0 (H)    Microbiology: Recent Results (from the past 240 hour(s))  MRSA PCR Screening     Status: None   Collection Time: 04/01/17  8:43 AM  Result Value Ref Range Status   MRSA by PCR NEGATIVE NEGATIVE Final    Comment:        The GeneXpert MRSA Assay (FDA approved for NASAL specimens only), is one component of a comprehensive MRSA  colonization surveillance program. It is not intended to diagnose MRSA infection nor to guide or monitor treatment for MRSA infections.   Body fluid culture     Status: None (Preliminary result)   Collection Time: 04/02/17 11:32 AM  Result Value Ref Range Status   Specimen Description SYNOVIAL HIP  Final   Special Requests Normal  Final   Gram Stain   Final    MODERATE WBC PRESENT, PREDOMINANTLY PMN NO ORGANISMS SEEN    Culture  Final    NO GROWTH < 24 HOURS Performed at [facility], [place] [address] [place], [place] [address]    Report Status PENDING  Incomplete   Left hip MRI 04/01/2017  IMPRESSION: 1. Mild muscle edema within the bilateral gluteus minimus muscles, left obturator externus, left pectineus and left adductor brevis muscles most concerning for muscle strain versus mild myositis versus reactive inflammatory changes secondary to left hip joint effusion. 2. Nonspecific left hip joint effusion. If there is concern regarding septic arthritis, recommend arthrocentesis. 3.   Electronically Signed   By: Kathreen Devoid   On: 04/02/2017 08:32  Michel Bickers, [facility] for Infectious [place] [phone] pager   [phone] cell 04/03/2017, 1:25 PM

## 2017-04-03 NOTE — Op Note (Signed)
OPERATIVE REPORT  SURGEON: Rod Can, MD   ASSISTANT: Staff.  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   PROCEDURE: Open arthrotomy and drainage of left hip.  IMPLANTS: None.  ANESTHESIA:  General  ESTIMATED BLOOD LOSS: 50 mL.  BLOOD PRODUCTS: None.  ANTIBIOTICS: 1 g vancomycin.  SPECIMENS: 1. Left hip synovial fluid for aerobic and anaerobic culture. 2. Left hip capsule for tissue culture.  DRAINS: None.  COMPLICATIONS: None.   CONDITION: PACU - hemodynamically stable.   BRIEF CLINICAL NOTE: Briana Smith is a 76 y.o. female who was admitted to the hospital 6 days ago for a left breast cellulitis and left hip pain. She was started on IV antibiotics. Her breast cellulitis began to improve, however her hip pain worsened. She was unable to weight-bear. Yesterday, and MRI of the left hip was obtained showing an effusion, and orthopedic surgery consultation was obtained. Dr. Rolena Infante evaluated the patient and recommended left hip arthrocentesis by interventional radiology. Radiology noted that the left hip synovial fluid was cloudy. Cell count was unable to be performed because the specimen was clotted. There were 97% neutrophils and the joint fluid. The Gram stain was negative. Culture is pending. I was asked to evaluate the patient by Dr. Rolena Infante. I felt that she had a significant risk of native septic joint arthritis, and clearly the diagnosis is difficult because she has been on antibiotics for a week. The risks, benefits, and alternatives to the procedure were explained, and the patient elected to proceed.  PROCEDURE IN DETAIL: Surgical site was marked by myself in the pre-op holding area. Once inside the operating room, general anesthesia was obtained. The patient was then positioned on the Hana table. All bony prominences were well padded. The hip was prepped and draped in  the normal sterile surgical fashion. A time-out was called verifying side and site of surgery. The patient received IV antibiotics within 60 minutes of beginning the procedure.  The direct anterior approach to the hip was performed through the Hueter interval. Lateral femoral circumflex vessels were treated with the Auqumantys. The anterior capsule was exposed and an inverted T capsulotomy was made with a #10 blade, taking care to avoid damage to the labrum and articular cartilage.I encountered a couple cc of seropurulent joint fluid. The joint fluid was swabbed for culture. I then performed a limited capsulectomy, and the capsule was sent for tissue culture.   The wound was copiously irrigated with normal saline using pulse lavage. Marcaine solution was injected into the periarticular soft tissue. The wound was closed in layers using #1 Stratafix for the fascia, 2-0 Vicryl for the subcutaneous fat, 2-0 Monocryl for the deep dermal layer, and staples plus Dermabond for the skin. Once the glue was fully dried, an Aquacell Ag dressing was applied. The patient was transported to the recovery room in stable condition. Sponge, needle, and instrument counts were correct at the end of the case x2. The patient tolerated the procedure well and there were no known complications.  Postoperatively, we will readmit the patient to the hospitalist service. She may weight-bear as tolerated with a walker. We will place her on Lovenox for DVT prophylaxis while she is in-house. We will follow her cultures. She will be evaluated by infectious disease for antibiotic selection and duration for her left hip septic arthritis.

## 2017-04-03 NOTE — Addendum Note (Signed)
Addendum  created 04/03/17 2129 by Claudia Desanctis, CRNA   Charge Capture section accepted

## 2017-04-03 NOTE — Anesthesia Postprocedure Evaluation (Signed)
Anesthesia Post Note  Patient: Briana Smith  Procedure(s) Performed: Procedure(s) (LRB): IRRIGATION AND [place] LEFT HIP (Left)     Patient location during evaluation: PACU Anesthesia Type: General Level of consciousness: awake and alert Pain management: pain level controlled Vital Signs Assessment: post-procedure vital signs reviewed and stable Respiratory status: spontaneous breathing, nonlabored ventilation, respiratory function stable and patient connected to nasal cannula oxygen Cardiovascular status: blood pressure returned to baseline and stable Postop Assessment: no signs of nausea or vomiting Anesthetic complications: no    Last Vitals:  Vitals:   04/03/17 2045 04/03/17 2100  BP: (!) 146/63 (!) 150/71  Pulse: 80 83  Resp: 18   Temp: 36.9 C 37.2 C    Last Pain:  Vitals:   04/03/17 2100  TempSrc:   PainSc: 4                  Tiajuana Amass

## 2017-04-03 NOTE — Interval H&P Note (Signed)
History and Physical Interval Note:  04/03/2017 5:48 PM  Briana Smith  has presented today for surgery, with the diagnosis of SEPTIC ARTHRITIS RIGHT HIP  The various methods of treatment have been discussed with the patient and family. After consideration of risks, benefits and other options for treatment, the patient has consented to  Procedure(s): IRRIGATION AND [place] (Left) as a surgical intervention .  The patient's history has been reviewed, patient examined, no change in status, stable for surgery.  I have reviewed the patient's chart and labs.  Questions were answered to the patient's satisfaction.     Deveon Kisiel, Horald Pollen

## 2017-04-03 NOTE — Anesthesia Preprocedure Evaluation (Addendum)
Anesthesia Evaluation  Patient identified by MRN, date of birth, ID band Patient awake    Reviewed: Allergy & Precautions, NPO status , Patient's Chart, lab work & pertinent test results  Airway Mallampati: II  TM Distance: >3 FB Neck ROM: Full    Dental  (+) Dental Advisory Given   Pulmonary neg pulmonary ROS,    breath sounds clear to auscultation       Cardiovascular hypertension, Pt. on medications  Rhythm:Regular Rate:Normal     Neuro/Psych negative neurological ROS     GI/Hepatic negative GI ROS, Neg liver ROS,   Endo/Other  negative endocrine ROS  Renal/GU negative Renal ROS     Musculoskeletal  (+) Arthritis ,   Abdominal   Peds  Hematology  (+) anemia ,   Anesthesia Other Findings   Reproductive/Obstetrics                            Lab Results  Component Value Date   WBC 4.8 04/03/2017   HGB 9.5 (L) 04/03/2017   HCT 28.9 (L) 04/03/2017   MCV 90.9 04/03/2017   PLT 189 04/03/2017   Lab Results  Component Value Date   CREATININE 0.71 04/03/2017   BUN 13 04/03/2017   NA 141 04/03/2017   K 3.8 04/03/2017   CL 106 04/03/2017   CO2 27 04/03/2017    Anesthesia Physical Anesthesia Plan  ASA: III  Anesthesia Plan: General   Post-op Pain Management:    Induction: Intravenous  PONV Risk Score and Plan: 3 and Ondansetron, Dexamethasone, Propofol and Treatment may vary due to age or medical condition  Airway Management Planned: Oral ETT  Additional Equipment:   Intra-op Plan:   Post-operative Plan: Extubation in OR  Informed Consent: I have reviewed the patients History and Physical, chart, labs and discussed the procedure including the risks, benefits and alternatives for the proposed anesthesia with the patient or authorized representative who has indicated his/her understanding and acceptance.   Dental advisory given  Plan Discussed with: CRNA  Anesthesia  Plan Comments:        Anesthesia Quick Evaluation

## 2017-04-03 NOTE — H&P (View-Only) (Signed)
Patient ID: Briana Smith MRN: [id number] DOB/AGE: 11/03/1940 76 y.o.  Admit date: 03/28/2017  Admission Diagnoses:  Principal Problem:   Cellulitis of left breast Active Problems:   Essential hypertension   History of breast cancer   Cellulitis of breast   HPI: Very pleasant 76 year old female pt with a past medical hx significant for Breast cancer.  She reports her treatments were complete last August.  The pt presented to the hospital on Saturday with cellulitis of her left breast.  She said she has had it before since her cancer.  She reports the left hip pain started at the same time as the cellulitis.  She reports significant left groin pain especially with weightbearing and ambulation.  She reports radiating pain into her anterior proximal LLE that does not go below the knee.  The pt reports the cellulitis has been improving but the left hip pain doe snot seem to be resolving.  The pt reports for the last two weeks she has been helping her husband with a significant amount of yard work and house maintenance.   Past Medical History: Past Medical History:  Diagnosis Date  . Cancer [facility])    breast cancer- left  . Heart murmur    years ago. no recent issues  . Hyperlipidemia   . Hypertension     Surgical History: Past Surgical History:  Procedure Laterality Date  . ABDOMINAL HYSTERECTOMY  1987   fibroids  . BREAST LUMPECTOMY  10/23/10   left  . PORT-A-CATH REMOVAL  09/02/2011   Procedure: REMOVAL PORT-A-CATH;  Surgeon: Adin Hector, MD;  Location: [place];  Service: General;  Laterality: Right;    Family History: Family History  Problem Relation Age of Onset  . COPD Father   . Diabetes Mother   . Dementia Mother   . Kidney disease Mother        not on dialysis  . Cancer Brother        jaw  . Hypertension Brother     Social History: Social History   Social History  . Marital status: Married    Spouse name: N/A  . Number of  children: N/A  . Years of education: N/A   Occupational History  . Not on file.   Social History Main Topics  . Smoking status: Never Smoker  . Smokeless tobacco: Never Used  . Alcohol use No  . Drug use: No  . Sexual activity: Not on file   Other Topics Concern  . Not on file   Social History Narrative   Married. Lives with husband. 3 children. 3 grandkids (22, 22, 18 in 2018).       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       Hobbies: working puzzles, reading, shoping, beach    Allergies: Patient has no known allergies.  Medications: I have reviewed the patient's current medications.  Vital Signs: Patient Vitals for the past 24 hrs:  BP Temp Temp src Pulse Resp SpO2  04/02/17 1312 (!) 133/59 98.5 F (36.9 C) Oral 73 - 98 %  04/02/17 0926 131/61 98.1 F (36.7 C) Oral 80 - 98 %  04/02/17 0553 135/66 97.9 F (36.6 C) Oral 70 18 97 %  04/01/17 2106 122/60 98.3 F (36.8 C) Oral 68 18 100 %    Radiology: [place] Chest  Result Date: 03/28/2017 CLINICAL DATA:  76 year old female with a history of breast carcinoma. Concern for cellulitis or abscess. EXAM:  CHEST ULTRASOUND COMPARISON:  None. FINDINGS: Limited ultrasound of the left chest in the region of clinical concern, with grayscale and color duplex performed. No focal fluid collection identified. IMPRESSION: Limited ultrasound of the left chest in the region of clinical concern demonstrates no focal fluid collection. Given the history of prior breast cancer and now left breast/chest wall symptoms, a complete outpatient oncologic workup as well as mammography workup should be considered. Electronically Signed   By: Corrie Mckusick D.O.   On: 03/28/2017 12:26   Ct Femur Left Wo Contrast  Result Date: 03/30/2017 CLINICAL DATA:  Left hip pain.  No known injury. EXAM: CT OF THE LOWER LEFT EXTREMITY WITHOUT CONTRAST TECHNIQUE: Multidetector CT imaging of the lower left extremity was performed according to the standard  protocol. COMPARISON:  None. FINDINGS: Bones/Joint/Cartilage No fracture or dislocation. Normal alignment. Left hip joint space is maintained. Left knee joint spaces are maintained. No left knee joint effusion. No aggressive lytic or sclerotic osseous lesion. Ligaments Ligaments are suboptimally evaluated by CT. Muscles and Tendons Muscles are normal. No intramuscular fluid collection or hematoma. No muscle atrophy. Soft tissue No fluid collection or hematoma. No soft tissue mass. Mild peripheral vascular atherosclerotic disease. IMPRESSION: No acute osseous injury of the left femur. Electronically Signed   By: Kathreen Devoid   On: 03/30/2017 13:20   Mr Hip Left Wo Contrast  Result Date: 04/02/2017 CLINICAL DATA:  Left hip pain. EXAM: MR OF THE LEFT HIP WITHOUT CONTRAST TECHNIQUE: Multiplanar, multisequence MR imaging was performed. No intravenous contrast was administered. COMPARISON:  CT left hip 03/30/2017 FINDINGS: Bones: No hip fracture, dislocation or avascular necrosis. No periosteal reaction or bone destruction. No aggressive osseous lesion. Normal sacrum and sacroiliac joints. No SI joint widening or erosive changes. Disc desiccation at L5-S1. Articular cartilage and labrum Articular cartilage:  No chondral defect. Labrum: Grossly intact, but evaluation is limited by lack of intraarticular fluid. Joint or bursal effusion Joint effusion: Small left hip joint effusion. No right hip joint effusion. No SI joint effusion. Bursae:  No bursa formation. Muscles and tendons Flexors: Normal. Extensors: Normal. Abductors: Mild muscle edema within the left operator externus, pectineus and adductor brevis muscles most concerning for muscle strain versus mild myositis. No tendon tear. Adductors: Normal. Gluteals: Mild muscle edema in the right gluteus minimus muscle. Mild muscle edema in the left gluteus minimus muscle. Mild edema surrounding the proximal left vastus lateralis muscle. Hamstrings: Normal. Other findings  Miscellaneous: Small amount of pelvic free fluid. Prior hysterectomy. No fluid collection or hematoma. No inguinal lymphadenopathy. No inguinal hernia. IMPRESSION: 1. Mild muscle edema within the bilateral gluteus minimus muscles, left obturator externus, left pectineus and left adductor brevis muscles most concerning for muscle strain versus mild myositis versus reactive inflammatory changes secondary to left hip joint effusion. 2. Nonspecific left hip joint effusion. If there is concern regarding septic arthritis, recommend arthrocentesis. 3. Electronically Signed   By: Kathreen Devoid   On: 04/02/2017 08:32   Dg Fluoro Guided Needle Plc Aspiration/injection Loc  Result Date: 04/02/2017 CLINICAL DATA:  76 y/o female with unexplained severe left hip pain following admission for breast cellulitis. Abnormal hip MRI including left hip joint effusion. Ultrasound guided left hip aspiration requested. EXAM: LEFT HIP ASPIRATION UNDER FLUOROSCOPY FLUOROSCOPY TIME:  Fluoroscopy Time:  0 minutes 29 seconds Radiation Exposure Index (if provided by the fluoroscopic device): 7.26 mGy Number of Acquired Spot Images: 0 PROCEDURE: The rationale for the procedure including risks and benefits was discussed with the  patient and informed consent was obtained. A "time-out" was performed. The patient reported severe pain in the left hip when lying with the leg flat, and the left hip in a neutral position. And she was holding her left hip flexed which offered pain relief. Therefore, she initially refused fluoroscopic left hip aspiration in the usual neutral position. I agreed to attempt a left hip aspiration with her hip flexed, but advised that the fluoroscopic approach to the left hip joint may not be feasible in that position. Overlying skin site was identified fluoroscopically and the Overlying skin prepped with Betadine, draped in the usual sterile fashion, and infiltrated locally with buffered Lidocaine. Curved 3.5 inch x 20 gauge  spinal needle advanced toward the left hip joint with intermittent fluoroscopy, but it soon became apparent that with the left hip significantly flexed, the joint space could not be approached. She agreed for me to briefly attempt a left hip aspiration with the typical neutral position of the left hip. She moved her left hip into the neutral position which did cause significant pain. Under fluoroscopic guidance the spinal needle was then advanced along the left femoral neck to the margin of the left femoral head. Aspiration with a 10 cc syringe then yielded about 2 mm of viscous serosanguineous fluid. Additional aspiration with a second 10 cc syringe yielded only trace additional fluid. Before withdrawing the needle, 2 mL of Isovue 370 was injected and demonstrated an intra-arterial spread of contrast. The needle was withdrawn, direct pressure was held, and hemostasis was noted. The patient tolerated the procedure, and was in stable condition at its conclusion. IMPRESSION: Successful fluoroscopic guided left hip joint aspiration, yielding a 2 mL of viscous serosanguineous fluid. This was sent to the lab for analysis. Electronically Signed   By: Genevie Ann M.D.   On: 04/02/2017 16:45   Dg Hip Unilat With Pelvis 2-3 Views Left  Result Date: 03/29/2017 CLINICAL DATA:  Sudden onset LEFT flank pain EXAM: DG HIP (WITH OR WITHOUT PELVIS) 2-3V LEFT COMPARISON:  None. FINDINGS: Hips are located. No evidence of pelvic fracture or sacral fracture. Dedicated view of the LEFT hip demonstrates no femoral neck fracture. IMPRESSION: No pelvic fracture or LEFT hip fracture. Electronically Signed   By: Suzy Bouchard M.D.   On: 03/29/2017 10:34    Labs:  Recent Labs  04/01/17 0445 04/02/17 0622  WBC 5.4 5.1  RBC 3.14* 3.32*  HCT 29.0* 30.1*  PLT 162 175    Recent Labs  04/01/17 0445 04/02/17 0622  NA 140 142  K 3.8 3.8  CL 107 104  CO2 27 29  BUN 13 15  CREATININE 0.72 0.77  GLUCOSE 110* 106*  CALCIUM  8.4* 9.0   No results for input(s): LABPT, INR in the last 72 hours.  Review of Systems: ROS  Physical Exam: Neurologically intact ABD soft Sensation intact distally Compartment soft  Mild cellulitis noted on left breast Pain elicited with internal and external rotation of her left hip No pain to palpation of the left lower extremity, lateral hip or left buttock  Assessment and Plan: MRI has been performed Dr. Rolena Infante recommended left hip aspiration which was done today Pt will continue to be monitored by Internal medicine   Ronette Deter PAC for Melina Schools, MD [facility] [phone]  Aspirate fluid noted to be turbid, 97 neutrophils, no crystals (unable to perform WBC) Patient with approximately 4-5 days of progressive hip pain Admitted last week for cellulitis Called yesterday to evaluate  for septic hip Given pain with ROM and aspirate I do have concerns for infection I do not have experience with surgical management of septic hip and so I have asked my partner Dr Lyla Glassing to evaluate and determine best treatment plan.   Remain NPO for now

## 2017-04-03 NOTE — Anesthesia Procedure Notes (Signed)
Procedure Name: Intubation Date/Time: 04/03/2017 6:19 PM Performed by: Anne Fu Pre-anesthesia Checklist: Patient identified, Emergency Drugs available, Suction available, Patient being monitored and Timeout performed Patient Re-evaluated:Patient Re-evaluated prior to inductionOxygen Delivery Method: Circle system utilized Preoxygenation: Pre-oxygenation with 100% oxygen Intubation Type: IV induction Ventilation: Mask ventilation without difficulty Laryngoscope Size: Mac and 4 Grade View: Grade I Tube type: Oral Tube size: 7.0 mm Number of attempts: 1 Airway Equipment and Method: Stylet Placement Confirmation: ETT inserted through vocal cords under direct vision,  positive ETCO2 and breath sounds checked- equal and bilateral Secured at: 22 cm Tube secured with: Tape Dental Injury: Teeth and Oropharynx as per pre-operative assessment

## 2017-04-03 NOTE — Progress Notes (Signed)
Patient ID: Briana Smith, female   DOB: Aug 01, 1941, 76 y.o.   MRN: [id number]  PROGRESS NOTE    Briana Smith  GMW:[id number] DOB: Dec 08, 1940 DOA: 03/28/2017 PCP: [name], MD   Brief Narrative:  76 year female with history of left breast cancer status post lumpectomy and chemoradiation, left breast cellulitis about 2 years ago and hypertension presented with left breast redness and pain and was admitted with cellulitis on intravenous antibiotics. She also is having severe left  thigh pain   Assessment & Plan:   Principal Problem:   Cellulitis of left breast Active Problems:   Essential hypertension   History of breast cancer   Cellulitis of breast   Cellulitis of the left breast - she received iv vancomycin since admission. Cultures negative so far, mrsa screening negative -Breast ultrasound did not show any abscess -continue abx  left hip septic arthritis:  -MRI left hip concerns for left hip joint effusion,   - left hip arthrocentesis by IR on 6/28, synovial fluids turbid with 97% neutrophil, culture pending  -abx Vancomycin from 03/28/2017 to 6/27, Ancef from 6/27 to 6/29, Restarted vanc on 6/29 -ortho and infectious disease consulted, will follow recommendations   Leukocytosis/thrombocytopenia Wbc 17.4 , plt 135 on admission - Probably reactive; resolved  History of breast cancer -Status post lumpectomy, chemotherapy and radiation. Report last memogram in 10/2016 which is unremarkable  Essential hypertension -She is on lisinopril and HCTZ. Continued - Blood pressure controlled  Nausea and vomiting -Probably from cellulitis. - Resolved  Constipation: increase stool regimen  DVT prophylaxis:SQ Heparin Code Status:Full code Family Communication:Discussed with husband present at bedside Disposition Plan:pending Consultants: [facility] orthopedics Dr Rolena Infante Interventional radiology Infectious disease  Procedures:  lower extremity  duplex ultrasound on 03/29/2017: Negative for DVT Left hip arthrocentesis on 6/28  Antimicrobials:  Vancomycin from 03/28/2017 to 6/27 Ancef from 6/27 to 6/29 Restarted vanc on 6/29  Subjective: She report left breast is improving, But she is irritated due to severe left thigh pain.  Had bm, No fever Husband at bedside  Objective: Vitals:   04/02/17 0926 04/02/17 1312 04/02/17 2021 04/03/17 0512  BP: 131/61 (!) 133/59 (!) 117/56 127/66  Pulse: 80 73 72 (!) 58  Resp:   16 16  Temp: 98.1 F (36.7 C) 98.5 F (36.9 C) 99 F (37.2 C) 97.9 F (36.6 C)  TempSrc: Oral Oral Oral Oral  SpO2: 98% 98% 97% 97%  Weight:      Height:        Intake/Output Summary (Last 24 hours) at 04/03/17 0759 Last data filed at 04/02/17 0900  Gross per 24 hour  Intake              120 ml  Output                0 ml  Net              120 ml   Filed Weights   03/28/17 0743  Weight: 75.8 kg (167 lb)    Examination:  General exam: Appears calm and comfortable  Respiratory system: Bilateral decreased breath sound at bases Cardiovascular system: S1 & S2 heard, Rate controlled  Gastrointestinal system: Abdomen is nondistended, soft and nontender. Normal bowel sounds heard. Extremities: Left thigh mildly tender. No cyanosis, clubbing, edema Skin: left breast erythema continue to improve, tenderness has resolved.   Data Reviewed: I have personally reviewed following labs and imaging studies  CBC:  Recent Labs Lab 03/28/17 0831  03/30/17 0522 03/31/17 1054 04/01/17 0445 04/02/17 0622 04/03/17 0550  WBC 17.4*  < > 7.3 7.2 5.4 5.1 4.8  NEUTROABS 16.1*  --   --   --   --   --   --   HGB 12.2  < > 9.7* 10.6* 9.5* 9.8* 9.5*  HCT 36.6  < > 29.4* 31.7* 29.0* 30.1* 28.9*  MCV 91.3  < > 93.3 91.9 92.4 90.7 90.9  PLT 172  < > 136* 156 162 175 189  < > = values in this interval not displayed. Basic Metabolic Panel:  Recent Labs Lab 03/30/17 0522 03/31/17 1054 04/01/17 0445 04/02/17 0622  04/03/17 0550  NA 138 136 140 142 141  K 3.8 4.2 3.8 3.8 3.8  CL 104 103 107 104 106  CO2 26 25 27 29 27   GLUCOSE 115* 149* 110* 106* 109*  BUN 13 10 13 15 13   CREATININE 0.69 0.74 0.72 0.77 0.71  CALCIUM 8.5* 8.6* 8.4* 9.0 8.8*  MG  --  2.2 2.0  --   --    GFR: Estimated Creatinine Clearance: 53.3 mL/min (by C-G formula based on SCr of 0.71 mg/dL). Liver Function Tests: No results for input(s): AST, ALT, ALKPHOS, BILITOT, PROT, ALBUMIN in the last 168 hours. No results for input(s): LIPASE, AMYLASE in the last 168 hours. No results for input(s): AMMONIA in the last 168 hours. Coagulation Profile:  Recent Labs Lab 03/28/17 1214  INR 0.98   Cardiac Enzymes: No results for input(s): CKTOTAL, CKMB, CKMBINDEX, TROPONINI in the last 168 hours. BNP (last 3 results) No results for input(s): PROBNP in the last 8760 hours. HbA1C: No results for input(s): HGBA1C in the last 72 hours. CBG: No results for input(s): GLUCAP in the last 168 hours. Lipid Profile: No results for input(s): CHOL, HDL, LDLCALC, TRIG, CHOLHDL, LDLDIRECT in the last 72 hours. Thyroid Function Tests: No results for input(s): TSH, T4TOTAL, FREET4, T3FREE, THYROIDAB in the last 72 hours. Anemia Panel: No results for input(s): VITAMINB12, FOLATE, FERRITIN, TIBC, IRON, RETICCTPCT in the last 72 hours. Sepsis Labs: No results for input(s): PROCALCITON, LATICACIDVEN in the last 168 hours.  Recent Results (from the past 240 hour(s))  MRSA PCR Screening     Status: None   Collection Time: 04/01/17  8:43 AM  Result Value Ref Range Status   MRSA by PCR NEGATIVE NEGATIVE Final    Comment:        The GeneXpert MRSA Assay (FDA approved for NASAL specimens only), is one component of a comprehensive MRSA colonization surveillance program. It is not intended to diagnose MRSA infection nor to guide or monitor treatment for MRSA infections.          Radiology Studies: Mr Hip Left Wo Contrast  Result Date:  04/02/2017 CLINICAL DATA:  Left hip pain. EXAM: MR OF THE LEFT HIP WITHOUT CONTRAST TECHNIQUE: Multiplanar, multisequence MR imaging was performed. No intravenous contrast was administered. COMPARISON:  CT left hip 03/30/2017 FINDINGS: Bones: No hip fracture, dislocation or avascular necrosis. No periosteal reaction or bone destruction. No aggressive osseous lesion. Normal sacrum and sacroiliac joints. No SI joint widening or erosive changes. Disc desiccation at L5-S1. Articular cartilage and labrum Articular cartilage:  No chondral defect. Labrum: Grossly intact, but evaluation is limited by lack of intraarticular fluid. Joint or bursal effusion Joint effusion: Small left hip joint effusion. No right hip joint effusion. No SI joint effusion. Bursae:  No bursa formation. Muscles and tendons Flexors: Normal. Extensors: Normal. Abductors: Mild muscle  edema within the left operator externus, pectineus and adductor brevis muscles most concerning for muscle strain versus mild myositis. No tendon tear. Adductors: Normal. Gluteals: Mild muscle edema in the right gluteus minimus muscle. Mild muscle edema in the left gluteus minimus muscle. Mild edema surrounding the proximal left vastus lateralis muscle. Hamstrings: Normal. Other findings Miscellaneous: Small amount of pelvic free fluid. Prior hysterectomy. No fluid collection or hematoma. No inguinal lymphadenopathy. No inguinal hernia. IMPRESSION: 1. Mild muscle edema within the bilateral gluteus minimus muscles, left obturator externus, left pectineus and left adductor brevis muscles most concerning for muscle strain versus mild myositis versus reactive inflammatory changes secondary to left hip joint effusion. 2. Nonspecific left hip joint effusion. If there is concern regarding septic arthritis, recommend arthrocentesis. 3. Electronically Signed   By: Kathreen Devoid   On: 04/02/2017 08:32   Dg Fluoro Guided Needle Plc Aspiration/injection Loc  Result Date:  04/02/2017 CLINICAL DATA:  76 y/o female with unexplained severe left hip pain following admission for breast cellulitis. Abnormal hip MRI including left hip joint effusion. Ultrasound guided left hip aspiration requested. EXAM: LEFT HIP ASPIRATION UNDER FLUOROSCOPY FLUOROSCOPY TIME:  Fluoroscopy Time:  0 minutes 29 seconds Radiation Exposure Index (if provided by the fluoroscopic device): 7.26 mGy Number of Acquired Spot Images: 0 PROCEDURE: The rationale for the procedure including risks and benefits was discussed with the patient and informed consent was obtained. A "time-out" was performed. The patient reported severe pain in the left hip when lying with the leg flat, and the left hip in a neutral position. And she was holding her left hip flexed which offered pain relief. Therefore, she initially refused fluoroscopic left hip aspiration in the usual neutral position. I agreed to attempt a left hip aspiration with her hip flexed, but advised that the fluoroscopic approach to the left hip joint may not be feasible in that position. Overlying skin site was identified fluoroscopically and the Overlying skin prepped with Betadine, draped in the usual sterile fashion, and infiltrated locally with buffered Lidocaine. Curved 3.5 inch x 20 gauge spinal needle advanced toward the left hip joint with intermittent fluoroscopy, but it soon became apparent that with the left hip significantly flexed, the joint space could not be approached. She agreed for me to briefly attempt a left hip aspiration with the typical neutral position of the left hip. She moved her left hip into the neutral position which did cause significant pain. Under fluoroscopic guidance the spinal needle was then advanced along the left femoral neck to the margin of the left femoral head. Aspiration with a 10 cc syringe then yielded about 2 mm of viscous serosanguineous fluid. Additional aspiration with a second 10 cc syringe yielded only trace  additional fluid. Before withdrawing the needle, 2 mL of Isovue 370 was injected and demonstrated an intra-arterial spread of contrast. The needle was withdrawn, direct pressure was held, and hemostasis was noted. The patient tolerated the procedure, and was in stable condition at its conclusion. IMPRESSION: Successful fluoroscopic guided left hip joint aspiration, yielding a 2 mL of viscous serosanguineous fluid. This was sent to the lab for analysis. Electronically Signed   By: Genevie Ann M.D.   On: 04/02/2017 16:45        Scheduled Meds: . [START ON 04/05/2017] aspirin EC  81 mg Oral Daily  . bisacodyl  10 mg Rectal Daily  . lisinopril  10 mg Oral Daily   And  . hydrochlorothiazide  6.25 mg Oral Daily  .  ibuprofen  400 mg Oral TID  . mineral oil  1 enema Rectal Once  . multivitamin with minerals  1 tablet Oral Daily  . polyethylene glycol  17 g Oral BID  . pyridoxine  100 mg Oral Daily  . senna-docusate  2 tablet Oral BID  . timolol  1 drop Both Eyes BID   Continuous Infusions: .  ceFAZolin (ANCEF) IV Stopped (04/03/17 0341)     LOS: 5 days     Time spent> 18mins   Erina Hamme, MD PhD [facility] Pager [phone]  If 7PM-7AM, please contact night-coverage [email or web address] Password [email or web address] 04/03/2017, 7:59 AM

## 2017-04-03 NOTE — Care Management Note (Signed)
Case Management Note  Patient Details  Name: Briana Smith MRN: [id number] Date of Birth: 01/02/1941  Subjective/Objective: 76 y/o f admitted w/l breast cellulitis. From home. [facility] infusion rep Pam already following for long term iv abx.                   Action/Plan:d/c plan home w/home infusion.   Expected Discharge Date:  03/31/17               Expected Discharge Plan:  [place]  In-House Referral:     Discharge planning Services  CM Consult  Post Acute Care Choice:    Choice offered to:     DME Arranged:    DME Agency:     HH Arranged:    [place] Agency:     Status of Service:  In process, will continue to follow  If discussed at Long Length of Stay Meetings, dates discussed:    Additional Comments:  Dessa Phi, RN 04/03/2017, 3:04 PM

## 2017-04-04 ENCOUNTER — Encounter (HOSPITAL_COMMUNITY): Payer: Self-pay | Admitting: Orthopedic Surgery

## 2017-04-04 DIAGNOSIS — M00052 Staphylococcal arthritis, left hip: Secondary | ICD-10-CM

## 2017-04-04 LAB — BASIC METABOLIC PANEL
ANION GAP: 9 (ref 5–15)
BUN: 20 mg/dL (ref 6–20)
CALCIUM: 8.9 mg/dL (ref 8.9–10.3)
CHLORIDE: 102 mmol/L (ref 101–111)
CO2: 27 mmol/L (ref 22–32)
Creatinine, Ser: 0.93 mg/dL (ref 0.44–1.00)
GFR calc non Af Amer: 57 mL/min — ABNORMAL LOW (ref >60)
Glucose, Bld: 135 mg/dL — ABNORMAL HIGH (ref 65–99)
Potassium: 5.5 mmol/L — ABNORMAL HIGH (ref 3.5–5.1)
SODIUM: 138 mmol/L (ref 135–145)

## 2017-04-04 LAB — MAGNESIUM: MAGNESIUM: 2.1 mg/dL (ref 1.7–2.4)

## 2017-04-04 LAB — CBC WITH DIFFERENTIAL/PLATELET
BASOS ABS: 0 10*3/uL (ref 0.0–0.1)
BASOS PCT: 0 %
EOS ABS: 0 10*3/uL (ref 0.0–0.7)
Eosinophils Relative: 0 %
HEMATOCRIT: 29.5 % — AB (ref 36.0–46.0)
HEMOGLOBIN: 9.8 g/dL — AB (ref 12.0–15.0)
Lymphocytes Relative: 8 %
Lymphs Abs: 0.6 10*3/uL — ABNORMAL LOW (ref 0.7–4.0)
MCH: 30.3 pg (ref 26.0–34.0)
MCHC: 33.2 g/dL (ref 30.0–36.0)
MCV: 91.3 fL (ref 78.0–100.0)
Monocytes Absolute: 0.4 10*3/uL (ref 0.1–1.0)
Monocytes Relative: 5 %
NEUTROS ABS: 7 10*3/uL (ref 1.7–7.7)
NEUTROS PCT: 87 %
Platelets: 215 10*3/uL (ref 150–400)
RBC: 3.23 MIL/uL — ABNORMAL LOW (ref 3.87–5.11)
RDW: 14.1 % (ref 11.5–15.5)
WBC: 8 10*3/uL (ref 4.0–10.5)

## 2017-04-04 MED ORDER — SODIUM CHLORIDE 0.9% FLUSH
10.0000 mL | INTRAVENOUS | Status: DC | PRN
  Administered 2017-04-06 (×2): 10 mL
  Filled 2017-04-04 (×2): qty 40

## 2017-04-04 MED ORDER — ACETAMINOPHEN 10 MG/ML IV SOLN
1000.0000 mg | Freq: Four times a day (QID) | INTRAVENOUS | Status: AC
  Administered 2017-04-04: 1000 mg via INTRAVENOUS
  Filled 2017-04-04 (×4): qty 100

## 2017-04-04 NOTE — Progress Notes (Signed)
   Subjective: 1 Day Post-Op Procedure(s) (LRB): IRRIGATION AND DEBRIDIMENT LEFT HIP (Left)  Recheck left hip s/p I&D yesterday C/o mild pain in the hip currently  Denies any numbness or tingling distally Otherwise doing fair Patient reports pain as mild.  Objective:   VITALS:   Vitals:   04/03/17 2235 04/04/17 0000  BP: (!) 142/84 138/73  Pulse: 72 70  Resp: 17   Temp: 98.3 F (36.8 C)     Left hip incision healing well Dressing in place with no drainage nv intact distally No rashes distally  LABS  Recent Labs  04/02/17 0622 04/03/17 0550  HGB 9.8* 9.5*  HCT 30.1* 28.9*  WBC 5.1 4.8  PLT 175 189     Recent Labs  04/02/17 0622 04/03/17 0550  NA 142 141  K 3.8 3.8  BUN 15 13  CREATININE 0.77 0.71  GLUCOSE 106* 109*     Assessment/Plan: 1 Day Post-Op Procedure(s) (LRB): IRRIGATION AND DEBRIDIMENT LEFT HIP (Left) Continue IV antibiotics PT/OT as able Pulmonary toilet    Brad Vallorie Niccoli, MPAS, PA-C  04/04/2017, 7:35 AM

## 2017-04-04 NOTE — Progress Notes (Signed)
Physical Therapy Treatment Patient Details Name: Briana Smith MRN: [id number] DOB: Jun 09, 1941 Today's Date: 04/04/2017    History of Present Illness Briana Smith is a 76 y.o. female with medical history significant of left breast cancer status post lumpectomy and chemoradiation, history of left breast cellulitis about 2 years ago and hypertension. Patient reported she developed fever, sweating and breast soreness about 1 AM this morning, when she checked her breast it was very erythematous and tender so she came to the hospital for further evaluation; In addition pt developed L  hip /LE pain; xrays and CT negative; MRI = muscle edema and small L hip effusion; pt pain continued to worsen and now s/p  I&D L hip  6/29 per Dr. Lyla Glassing    PT Comments    Pt is feeling much better today, pain improved since L hip I&D, incr amb distance; will continue to follow in acute setting  Follow Up Recommendations  No PT follow up     Equipment Recommendations  None recommended by PT    Recommendations for Other Services       Precautions / Restrictions Restrictions Weight Bearing Restrictions: No  WBAT   Mobility  Bed Mobility Overal bed mobility: Needs Assistance             General bed mobility comments: pt standing up in room on PT arrival, family present  Transfers Overall transfer level: Needs assistance Equipment used: Rolling walker (2 wheeled) Transfers: Sit to/from Stand Sit to Stand: Supervision         General transfer comment: cues for hand placement  Ambulation/Gait Ambulation/Gait assistance: Supervision;Min guard Ambulation Distance (Feet): 120 Feet Assistive device: Rolling walker (2 wheeled) Gait Pattern/deviations: Step-to pattern;Step-through pattern     General Gait Details: cues for gait progression, much improved ability to WB on LLE   Stairs            Wheelchair Mobility    Modified Rankin (Stroke Patients Only)       Balance              Standing balance-Leahy Scale: Fair                              Cognition Arousal/Alertness: Awake/alert Behavior During Therapy: WFL for tasks assessed/performed Overall Cognitive Status: Within Functional Limits for tasks assessed                                        Exercises General Exercises - Lower Extremity Ankle Circles/Pumps: AROM;Both    General Comments        Pertinent Vitals/Pain Pain Assessment: No/denies pain    Home Living                      Prior Function            PT Goals (current goals can now be found in the care plan section) Acute Rehab PT Goals Patient Stated Goal: less pain PT Goal Formulation: With patient Time For Goal Achievement: 04/07/17 Potential to Achieve Goals: Good Progress towards PT goals: Progressing toward goals    Frequency    Min 3X/week      PT Plan Current plan remains appropriate    Co-evaluation              AM-PAC PT "6 Clicks"  Daily Activity  Outcome Measure  Difficulty turning over in bed (including adjusting bedclothes, sheets and blankets)?: None Difficulty moving from lying on back to sitting on the side of the bed? : None Difficulty sitting down on and standing up from a chair with arms (e.g., wheelchair, bedside commode, etc,.)?: None Help needed moving to and from a bed to chair (including a wheelchair)?: A Little Help needed walking in hospital room?: A Little Help needed climbing 3-5 steps with a railing? : A Little 6 Click Score: 21    End of Session Equipment Utilized During Treatment: Gait belt Activity Tolerance: Patient tolerated treatment well;No increased pain Patient left: in chair;with call bell/phone within reach;with family/visitor present   PT Visit Diagnosis: Difficulty in walking, not elsewhere classified (R26.2);Pain     Time: 8832-5498 PT Time Calculation (min) (ACUTE ONLY): 28 min  Charges:  $Gait Training:  23-37 mins                    G CodesKenyon Ana, PT Pager: [phone] 04/04/2017    Kenyon Ana 04/04/2017, 11:50 AM

## 2017-04-04 NOTE — Progress Notes (Signed)
OT Cancellation Note  Patient Details Name: Briana Smith MRN: [id number] DOB: 1941/01/21   Cancelled Treatment:    Reason Eval/Treat Not Completed: Patient at procedure or test/ unavailable. Sterile procedure in progress at this time, will re attempt later today as time allows/as approrpiate  [name] 04/04/2017, 12:54 PM

## 2017-04-04 NOTE — Progress Notes (Signed)
Peripherally Inserted Central Catheter/Midline Placement  The IV Nurse has discussed with the patient and/or persons authorized to consent for the patient, the purpose of this procedure and the potential benefits and risks involved with this procedure.  The benefits include less needle sticks, lab draws from the catheter, and the patient may be discharged home with the catheter. Risks include, but not limited to, infection, bleeding, blood clot (thrombus formation), and puncture of an artery; nerve damage and irregular heartbeat and possibility to perform a PICC exchange if needed/ordered by physician.  Alternatives to this procedure were also discussed.  Bard Power PICC patient education guide, fact sheet on infection prevention and patient information card has been provided to patient /or left at bedside.    PICC/Midline Placement Documentation        Briana Smith, Briana Smith 04/04/2017, 1:03 PM

## 2017-04-04 NOTE — Progress Notes (Signed)
Patient ID: Briana Smith, female   DOB: Jul 23, 1941, 76 y.o.   MRN: [id number]  PROGRESS NOTE    Briana Smith  IDP:[id number] DOB: 29-Oct-1940 DOA: 03/28/2017 PCP: [name], MD   Brief Narrative:  76 year female with history of left breast cancer status post lumpectomy and chemoradiation, left breast cellulitis about 2 years ago and hypertension presented with left breast redness and pain and was admitted with cellulitis on intravenous antibiotics. She also is having severe left  thigh pain   Assessment & Plan:   Principal Problem:   Septic arthritis of hip ([place]) Active Problems:   Essential hypertension   History of breast cancer   Cellulitis of left breast   Cellulitis of the left breast - she received iv vancomycin since admission. Cultures negative so far, mrsa screening negative -Breast ultrasound did not show any abscess -continue abx  left hip septic arthritis:  -MRI left hip concerns for left hip joint effusion,   - left hip arthrocentesis by IR on 6/28, synovial fluids turbid with 97% neutrophil, culture pending  -abx Vancomycin from 03/28/2017 to 6/27, Ancef from 6/27 to 6/29, Restarted vanc on 6/29 -s/pOpen arthrotomy and drainage of left hip on 6/29, culture pending.  -picc line placement - ortho and infectious disease consulted, will follow recommendations   Leukocytosis/thrombocytopenia Wbc 17.4 , plt 135 on admission - Probably reactive; resolved  History of breast cancer -Status post lumpectomy, chemotherapy and radiation. Report last memogram in 10/2016 which is unremarkable  Essential hypertension -She is on lisinopril and HCTZ. Continued - Blood pressure controlled  Nausea and vomiting -Probably from cellulitis. - Resolved  Constipation: increase stool regimen  DVT prophylaxis:SQ Heparin Code Status:Full code Family Communication:Discussed with husband present at bedside Disposition Plan:home with home health once  culture result finalized Consultants: [facility] orthopedics Dr Rolena Infante Interventional radiology Infectious disease  Procedures:  lower extremity duplex ultrasound on 03/29/2017: Negative for DVT Left hip arthrocentesis on 6/28  Antimicrobials:  Vancomycin from 03/28/2017 to 6/27 Ancef from 6/27 to 6/29 Restarted vanc on 6/29  Subjective: She report left breast is improving,  left thigh pain has improved, now she can move more.  Had bm, No fever Husband at bedside  Objective: Vitals:   04/03/17 2045 04/03/17 2100 04/03/17 2235 04/04/17 0000  BP: (!) 146/63 (!) 150/71 (!) 142/84 138/73  Pulse: 80 83 72 70  Resp: 18  17   Temp: 98.5 F (36.9 C) 98.9 F (37.2 C) 98.3 F (36.8 C)   TempSrc:   Oral   SpO2: 96% 97% 97%   Weight:      Height:        Intake/Output Summary (Last 24 hours) at 04/04/17 0759 Last data filed at 04/04/17 0138  Gross per 24 hour  Intake             1500 ml  Output              200 ml  Net             1300 ml   Filed Weights   03/28/17 0743  Weight: 75.8 kg (167 lb)    Examination:  General exam: Appears calm and comfortable  Respiratory system: Bilateral decreased breath sound at bases Cardiovascular system: S1 & S2 heard, Rate controlled  Gastrointestinal system: Abdomen is nondistended, soft and nontender. Normal bowel sounds heard. Extremities: Left hip post op changes, dressing intact, nontender. No cyanosis, clubbing, edema Skin: left breast erythema continue to improve, tenderness  has resolved.   Data Reviewed: I have personally reviewed following labs and imaging studies  CBC:  Recent Labs Lab 03/28/17 0831  03/30/17 0522 03/31/17 1054 04/01/17 0445 04/02/17 0622 04/03/17 0550  WBC 17.4*  < > 7.3 7.2 5.4 5.1 4.8  NEUTROABS 16.1*  --   --   --   --   --   --   HGB 12.2  < > 9.7* 10.6* 9.5* 9.8* 9.5*  HCT 36.6  < > 29.4* 31.7* 29.0* 30.1* 28.9*  MCV 91.3  < > 93.3 91.9 92.4 90.7 90.9  PLT 172  < > 136* 156 162 175  189  < > = values in this interval not displayed. Basic Metabolic Panel:  Recent Labs Lab 03/30/17 0522 03/31/17 1054 04/01/17 0445 04/02/17 0622 04/03/17 0550  NA 138 136 140 142 141  K 3.8 4.2 3.8 3.8 3.8  CL 104 103 107 104 106  CO2 26 25 27 29 27   GLUCOSE 115* 149* 110* 106* 109*  BUN 13 10 13 15 13   CREATININE 0.69 0.74 0.72 0.77 0.71  CALCIUM 8.5* 8.6* 8.4* 9.0 8.8*  MG  --  2.2 2.0  --   --    GFR: Estimated Creatinine Clearance: 53.3 mL/min (by C-G formula based on SCr of 0.71 mg/dL). Liver Function Tests: No results for input(s): AST, ALT, ALKPHOS, BILITOT, PROT, ALBUMIN in the last 168 hours. No results for input(s): LIPASE, AMYLASE in the last 168 hours. No results for input(s): AMMONIA in the last 168 hours. Coagulation Profile:  Recent Labs Lab 03/28/17 1214  INR 0.98   Cardiac Enzymes: No results for input(s): CKTOTAL, CKMB, CKMBINDEX, TROPONINI in the last 168 hours. BNP (last 3 results) No results for input(s): PROBNP in the last 8760 hours. HbA1C: No results for input(s): HGBA1C in the last 72 hours. CBG: No results for input(s): GLUCAP in the last 168 hours. Lipid Profile: No results for input(s): CHOL, HDL, LDLCALC, TRIG, CHOLHDL, LDLDIRECT in the last 72 hours. Thyroid Function Tests: No results for input(s): TSH, T4TOTAL, FREET4, T3FREE, THYROIDAB in the last 72 hours. Anemia Panel: No results for input(s): VITAMINB12, FOLATE, FERRITIN, TIBC, IRON, RETICCTPCT in the last 72 hours. Sepsis Labs: No results for input(s): PROCALCITON, LATICACIDVEN in the last 168 hours.  Recent Results (from the past 240 hour(s))  MRSA PCR Screening     Status: None   Collection Time: 04/01/17  8:43 AM  Result Value Ref Range Status   MRSA by PCR NEGATIVE NEGATIVE Final    Comment:        The GeneXpert MRSA Assay (FDA approved for NASAL specimens only), is one component of a comprehensive MRSA colonization surveillance program. It is not intended to  diagnose MRSA infection nor to guide or monitor treatment for MRSA infections.   Body fluid culture     Status: None (Preliminary result)   Collection Time: 04/02/17 11:32 AM  Result Value Ref Range Status   Specimen Description SYNOVIAL HIP  Final   Special Requests Normal  Final   Gram Stain   Final    MODERATE WBC PRESENT, PREDOMINANTLY PMN NO ORGANISMS SEEN    Culture   Final    NO GROWTH < 24 HOURS Performed at [facility], [place] [address], [place], [place] [address]    Report Status PENDING  Incomplete  Aerobic/Anaerobic Culture (surgical/deep wound)     Status: None (Preliminary result)   Collection Time: 04/03/17  6:55 PM  Result Value Ref  Range Status   Specimen Description WOUND L HIP SYNOVIAL FLUID  Final   Special Requests NONE  Final   Gram Stain   Final    RARE WBC PRESENT, PREDOMINANTLY MONONUCLEAR NO ORGANISMS SEEN Performed at [facility], [place] [address], [place], [place] [address]    Culture PENDING  Incomplete   Report Status PENDING  Incomplete  Aerobic/Anaerobic Culture (surgical/deep wound)     Status: None (Preliminary result)   Collection Time: 04/03/17  6:55 PM  Result Value Ref Range Status   Specimen Description TISSUE L HIP CAPSULE  Final   Special Requests NONE  Final   Gram Stain   Final    RARE WBC PRESENT,BOTH PMN AND MONONUCLEAR RARE GRAM POSITIVE COCCI IN PAIRS Performed at [facility], [place] [address], [place], [place] [address]    Culture PENDING  Incomplete   Report Status PENDING  Incomplete         Radiology Studies: Dg Fluoro Guided Needle Plc Aspiration/injection Loc  Addendum Date: 04/03/2017   ADDENDUM REPORT: 04/03/2017 21:06 ADDENDUM: The 2nd to last findings paragraph should read: "Before withdrawing the needle, 2 mL of Isovue 370 was injected and demonstrated an intra-ARTICULAR spread of contrast." Electronically Signed   By: Genevie Ann M.D.   On: 04/03/2017 21:06   Result Date: 04/03/2017 CLINICAL  DATA:  76 y/o female with unexplained severe left hip pain following admission for breast cellulitis. Abnormal hip MRI including left hip joint effusion. Ultrasound guided left hip aspiration requested. EXAM: LEFT HIP ASPIRATION UNDER FLUOROSCOPY FLUOROSCOPY TIME:  Fluoroscopy Time:  0 minutes 29 seconds Radiation Exposure Index (if provided by the fluoroscopic device): 7.26 mGy Number of Acquired Spot Images: 0 PROCEDURE: The rationale for the procedure including risks and benefits was discussed with the patient and informed consent was obtained. A "time-out" was performed. The patient reported severe pain in the left hip when lying with the leg flat, and the left hip in a neutral position. And she was holding her left hip flexed which offered pain relief. Therefore, she initially refused fluoroscopic left hip aspiration in the usual neutral position. I agreed to attempt a left hip aspiration with her hip flexed, but advised that the fluoroscopic approach to the left hip joint may not be feasible in that position. Overlying skin site was identified fluoroscopically and the Overlying skin prepped with Betadine, draped in the usual sterile fashion, and infiltrated locally with buffered Lidocaine. Curved 3.5 inch x 20 gauge spinal needle advanced toward the left hip joint with intermittent fluoroscopy, but it soon became apparent that with the left hip significantly flexed, the joint space could not be approached. She agreed for me to briefly attempt a left hip aspiration with the typical neutral position of the left hip. She moved her left hip into the neutral position which did cause significant pain. Under fluoroscopic guidance the spinal needle was then advanced along the left femoral neck to the margin of the left femoral head. Aspiration with a 10 cc syringe then yielded about 2 mm of viscous serosanguineous fluid. Additional aspiration with a second 10 cc syringe yielded only trace additional fluid. Before  withdrawing the needle, 2 mL of Isovue 370 was injected and demonstrated an intra-arterial spread of contrast. The needle was withdrawn, direct pressure was held, and hemostasis was noted. The patient tolerated the procedure, and was in stable condition at its conclusion. IMPRESSION: Successful fluoroscopic guided left hip joint aspiration, yielding a 2 mL of  viscous serosanguineous fluid. This was sent to the lab for analysis. Electronically Signed: By: Genevie Ann M.D. On: 04/02/2017 16:45        Scheduled Meds: . aspirin EC  81 mg Oral Daily  . bisacodyl  10 mg Rectal Daily  . enoxaparin (LOVENOX) injection  40 mg Subcutaneous Q24H  . lisinopril  10 mg Oral Daily   And  . hydrochlorothiazide  6.25 mg Oral Daily  . HYDROmorphone      . ibuprofen  400 mg Oral TID  . mineral oil  1 enema Rectal Once  . multivitamin with minerals  1 tablet Oral Daily  . polyethylene glycol  17 g Oral BID  . pyridoxine  100 mg Oral Daily  . senna-docusate  2 tablet Oral BID  . timolol  1 drop Both Eyes BID   Continuous Infusions: . acetaminophen Stopped (04/04/17 0223)  . vancomycin       LOS: 6 days     Time spent> 30mins   Julien Oscar, MD PhD [facility] Pager [phone]  If 7PM-7AM, please contact night-coverage [email or web address] Password [email or web address] 04/04/2017, 7:59 AM

## 2017-04-05 LAB — CBC WITH DIFFERENTIAL/PLATELET
Basophils Absolute: 0 10*3/uL (ref 0.0–0.1)
Basophils Relative: 0 %
Eosinophils Absolute: 0.1 10*3/uL (ref 0.0–0.7)
Eosinophils Relative: 2 %
HEMATOCRIT: 28.6 % — AB (ref 36.0–46.0)
Hemoglobin: 9.4 g/dL — ABNORMAL LOW (ref 12.0–15.0)
LYMPHS PCT: 17 %
Lymphs Abs: 1.2 10*3/uL (ref 0.7–4.0)
MCH: 30.1 pg (ref 26.0–34.0)
MCHC: 32.9 g/dL (ref 30.0–36.0)
MCV: 91.7 fL (ref 78.0–100.0)
MONO ABS: 0.6 10*3/uL (ref 0.1–1.0)
MONOS PCT: 9 %
NEUTROS ABS: 5 10*3/uL (ref 1.7–7.7)
Neutrophils Relative %: 72 %
Platelets: 226 10*3/uL (ref 150–400)
RBC: 3.12 MIL/uL — ABNORMAL LOW (ref 3.87–5.11)
RDW: 14.3 % (ref 11.5–15.5)
WBC: 6.9 10*3/uL (ref 4.0–10.5)

## 2017-04-05 LAB — BASIC METABOLIC PANEL
ANION GAP: 9 (ref 5–15)
BUN: 25 mg/dL — ABNORMAL HIGH (ref 6–20)
CALCIUM: 8.9 mg/dL (ref 8.9–10.3)
CO2: 30 mmol/L (ref 22–32)
CREATININE: 0.85 mg/dL (ref 0.44–1.00)
Chloride: 105 mmol/L (ref 101–111)
GFR calc Af Amer: >60 mL/min (ref >60)
GFR calc non Af Amer: >60 mL/min (ref >60)
GLUCOSE: 116 mg/dL — AB (ref 65–99)
Potassium: 4.2 mmol/L (ref 3.5–5.1)
Sodium: 144 mmol/L (ref 135–145)

## 2017-04-05 MED ORDER — HYDROCORTISONE 0.5 % EX CREA
TOPICAL_CREAM | Freq: Two times a day (BID) | CUTANEOUS | Status: DC
  Administered 2017-04-05 – 2017-04-06 (×3): via TOPICAL
  Filled 2017-04-05: qty 28.35

## 2017-04-05 MED ORDER — IBUPROFEN 200 MG PO TABS: 400.0000 mg | ORAL_TABLET | Freq: Four times a day (QID) | ORAL | Status: DC | PRN

## 2017-04-05 MED ORDER — SENNOSIDES-DOCUSATE SODIUM 8.6-50 MG PO TABS: 1.0000 | ORAL_TABLET | Freq: Every day | ORAL | Status: DC

## 2017-04-05 NOTE — Progress Notes (Signed)
Patient ID: Briana Smith, female   DOB: [birth year], 76 y.o.   MRN: [id number]  PROGRESS NOTE    WYLODEAN SHIMMEL  ATF:[id number] DOB: Jun 21, 1941 DOA: 03/28/2017 PCP: [name], MD   Brief Narrative:  76 year female with history of left breast cancer status post lumpectomy and chemoradiation, left breast cellulitis about 2 years ago and hypertension presented with left breast redness and pain and was admitted with cellulitis on intravenous antibiotics. She also is having severe left  thigh pain   Assessment & Plan:   Principal Problem:   Septic arthritis of hip ([place]) Active Problems:   Essential hypertension   History of breast cancer   Cellulitis of left breast   Cellulitis of the left breast - she received iv vancomycin since admission. Cultures negative so far, mrsa screening negative -Breast ultrasound did not show any abscess -continue abx with vanc  left hip septic arthritis:  -MRI left hip concerns for left hip joint effusion,   - left hip arthrocentesis by IR on 6/28, synovial fluids turbid with 97% neutrophil, culture pending  -abx Vancomycin from 03/28/2017 to 6/27, Ancef from 6/27 to 6/29, Restarted vanc on 6/29 -s/pOpen arthrotomy and drainage of left hip on 6/29, left hip synovial fluids no growth but left hip capsule culture +GPC in pairs, final result pending, blood culture no growth -s/p picc line placement, continue vanc - ortho and infectious disease consulted, will follow recommendations   Leukocytosis/thrombocytopenia Wbc 17.4 , plt 135 on admission - Probably reactive; resolved  History of breast cancer -Status post lumpectomy, chemotherapy and radiation. Report last memogram in 10/2016 which is unremarkable  Essential hypertension -She is on lisinopril and HCTZ. Continued - Blood pressure controlled  Nausea and vomiting -Probably from cellulitis. - Resolved  Constipation: resolved with stool regimen  DVT prophylaxis:SQ  Heparin Code Status:Full code Family Communication:Discussed with husband and daughter at bedside Disposition Plan:home with home health once culture result finalized Consultants: [facility] orthopedics Dr Rolena Infante Interventional radiology Infectious disease  Procedures:  lower extremity duplex ultrasound on 03/29/2017: Negative for DVT Left hip arthrocentesis on 6/28  Antimicrobials:  Vancomycin from 03/28/2017 to 6/27 Ancef from 6/27 to 6/29 Restarted vanc on 6/29  Subjective: Feeling much better today, left hip pain much improved, able to bear weight,  She report left breast is improving,  Husband at bedside  Objective: Vitals:   04/04/17 1200 04/04/17 1436 04/04/17 2046 04/05/17 0508  BP: 137/60 (!) 134/42 (!) 106/49 (!) 142/55  Pulse: 65 63 72 63  Resp:  18 18 18   Temp:  98.3 F (36.8 C) 98.7 F (37.1 C) 98.6 F (37 C)  TempSrc:  Oral Oral Oral  SpO2: 99% 97% 97% 97%  Weight:      Height:        Intake/Output Summary (Last 24 hours) at 04/05/17 0800 Last data filed at 04/05/17 0230  Gross per 24 hour  Intake              681 ml  Output                0 ml  Net              681 ml   Filed Weights   03/28/17 0743  Weight: 75.8 kg (167 lb)    Examination:  General exam: Appears calm and comfortable  Respiratory system: Bilateral decreased breath sound at bases Cardiovascular system: S1 & S2 heard, Rate controlled  Gastrointestinal system: Abdomen is nondistended,  soft and nontender. Normal bowel sounds heard. Extremities: Left hip post op changes, dressing intact, nontender. No cyanosis, clubbing, edema Skin: left breast erythema continue to improve, tenderness has resolved.   Data Reviewed: I have personally reviewed following labs and imaging studies  CBC:  Recent Labs Lab 04/01/17 0445 04/02/17 0622 04/03/17 0550 04/04/17 0853 04/05/17 0421  WBC 5.4 5.1 4.8 8.0 6.9  NEUTROABS  --   --   --  7.0 5.0  HGB 9.5* 9.8* 9.5* 9.8* 9.4*   HCT 29.0* 30.1* 28.9* 29.5* 28.6*  MCV 92.4 90.7 90.9 91.3 91.7  PLT 162 175 189 215 939   Basic Metabolic Panel:  Recent Labs Lab 03/31/17 1054 04/01/17 0445 04/02/17 0622 04/03/17 0550 04/04/17 0853 04/05/17 0421  NA 136 140 142 141 138 144  K 4.2 3.8 3.8 3.8 5.5* 4.2  CL 103 107 104 106 102 105  CO2 25 27 29 27 27 30   GLUCOSE 149* 110* 106* 109* 135* 116*  BUN 10 13 15 13 20  25*  CREATININE 0.74 0.72 0.77 0.71 0.93 0.85  CALCIUM 8.6* 8.4* 9.0 8.8* 8.9 8.9  MG 2.2 2.0  --   --  2.1  --    GFR: Estimated Creatinine Clearance: 50.2 mL/min (by C-G formula based on SCr of 0.85 mg/dL). Liver Function Tests: No results for input(s): AST, ALT, ALKPHOS, BILITOT, PROT, ALBUMIN in the last 168 hours. No results for input(s): LIPASE, AMYLASE in the last 168 hours. No results for input(s): AMMONIA in the last 168 hours. Coagulation Profile: No results for input(s): INR, PROTIME in the last 168 hours. Cardiac Enzymes: No results for input(s): CKTOTAL, CKMB, CKMBINDEX, TROPONINI in the last 168 hours. BNP (last 3 results) No results for input(s): PROBNP in the last 8760 hours. HbA1C: No results for input(s): HGBA1C in the last 72 hours. CBG: No results for input(s): GLUCAP in the last 168 hours. Lipid Profile: No results for input(s): CHOL, HDL, LDLCALC, TRIG, CHOLHDL, LDLDIRECT in the last 72 hours. Thyroid Function Tests: No results for input(s): TSH, T4TOTAL, FREET4, T3FREE, THYROIDAB in the last 72 hours. Anemia Panel: No results for input(s): VITAMINB12, FOLATE, FERRITIN, TIBC, IRON, RETICCTPCT in the last 72 hours. Sepsis Labs: No results for input(s): PROCALCITON, LATICACIDVEN in the last 168 hours.  Recent Results (from the past 240 hour(s))  MRSA PCR Screening     Status: None   Collection Time: 04/01/17  8:43 AM  Result Value Ref Range Status   MRSA by PCR NEGATIVE NEGATIVE Final    Comment:        The GeneXpert MRSA Assay (FDA approved for NASAL  specimens only), is one component of a comprehensive MRSA colonization surveillance program. It is not intended to diagnose MRSA infection nor to guide or monitor treatment for MRSA infections.   Body fluid culture     Status: None (Preliminary result)   Collection Time: 04/02/17 11:32 AM  Result Value Ref Range Status   Specimen Description SYNOVIAL HIP  Final   Special Requests Normal  Final   Gram Stain   Final    MODERATE WBC PRESENT, PREDOMINANTLY PMN NO ORGANISMS SEEN    Culture   Final    NO GROWTH 2 DAYS Performed at [facility], [place] [address] [place], [place] [address]    Report Status PENDING  Incomplete  Culture, blood (routine x 2)     Status: None (Preliminary result)   Collection Time: 04/03/17 10:38 AM  Result Value Ref Range  Status   Specimen Description BLOOD RIGHT ANTECUBITAL  Final   Special Requests IN PEDIATRIC BOTTLE Blood Culture adequate volume  Final   Culture   Final    NO GROWTH 1 DAY Performed at [facility], [address], [place], [place] [address]    Report Status PENDING  Incomplete  Culture, blood (routine x 2)     Status: None (Preliminary result)   Collection Time: 04/03/17 10:43 AM  Result Value Ref Range Status   Specimen Description BLOOD BLOOD RIGHT HAND  Final   Special Requests IN PEDIATRIC BOTTLE Blood Culture adequate volume  Final   Culture   Final    NO GROWTH 1 DAY Performed at [facility], [place] [address], [place], [place] [address]    Report Status PENDING  Incomplete  Aerobic/Anaerobic Culture (surgical/deep wound)     Status: None (Preliminary result)   Collection Time: 04/03/17  6:55 PM  Result Value Ref Range Status   Specimen Description WOUND L HIP SYNOVIAL FLUID  Final   Special Requests NONE  Final   Gram Stain   Final    RARE WBC PRESENT, PREDOMINANTLY MONONUCLEAR NO ORGANISMS SEEN Performed at [facility], [place] [address] [place], [place] [address]    Culture PENDING   Incomplete   Report Status PENDING  Incomplete  Aerobic/Anaerobic Culture (surgical/deep wound)     Status: None (Preliminary result)   Collection Time: 04/03/17  6:55 PM  Result Value Ref Range Status   Specimen Description TISSUE L HIP CAPSULE  Final   Special Requests NONE  Final   Gram Stain   Final    RARE WBC PRESENT,BOTH PMN AND MONONUCLEAR RARE GRAM POSITIVE COCCI IN PAIRS Performed at [facility], [place] [address] [place], [place] [address]    Culture PENDING  Incomplete   Report Status PENDING  Incomplete         Radiology Studies: No results found.      Scheduled Meds: . aspirin EC  81 mg Oral Daily  . enoxaparin (LOVENOX) injection  40 mg Subcutaneous Q24H  . lisinopril  10 mg Oral Daily   And  . hydrochlorothiazide  6.25 mg Oral Daily  . ibuprofen  400 mg Oral TID  . mineral oil  1 enema Rectal Once  . multivitamin with minerals  1 tablet Oral Daily  . polyethylene glycol  17 g Oral BID  . pyridoxine  100 mg Oral Daily  . senna-docusate  2 tablet Oral BID  . timolol  1 drop Both Eyes BID   Continuous Infusions: . vancomycin Stopped (04/05/17 0230)     LOS: 7 days     Time spent 24mins   Hitomi Slape, MD PhD [facility] Pager [phone]  If 7PM-7AM, please contact night-coverage [email or web address] Password [email or web address] 04/05/2017, 8:00 AM

## 2017-04-05 NOTE — Progress Notes (Signed)
    Subjective: 2 Days Post-Op Procedure(s) (LRB): IRRIGATION AND DEBRIDIMENT LEFT HIP (Left) Patient reports pain as 5 on 0-10 scale.  Pt reports continues improvement in pain level.  Denies CP or SOB.  Voiding without difficulty. Positive flatus.   Objective: Vital signs in last 24 hours: Temp:  [98.3 F (36.8 C)-98.7 F (37.1 C)] 98.6 F (37 C) (07/01 0508) Pulse Rate:  [63-72] 63 (07/01 0508) Resp:  [18] 18 (07/01 0508) BP: (106-142)/(42-60) 142/55 (07/01 0508) SpO2:  [97 %-99 %] 97 % (07/01 0508)  Intake/Output from previous day: 06/30 0701 - 07/01 0700 In: 681 [P.O.:481; IV Piggyback:200] Out: -  Intake/Output this shift: No intake/output data recorded.  Labs:  Recent Labs  04/03/17 0550 04/04/17 0853 04/05/17 0421  HGB 9.5* 9.8* 9.4*    Recent Labs  04/04/17 0853 04/05/17 0421  WBC 8.0 6.9  RBC 3.23* 3.12*  HCT 29.5* 28.6*  PLT 215 226    Recent Labs  04/04/17 0853 04/05/17 0421  NA 138 144  K 5.5* 4.2  CL 102 105  CO2 27 30  BUN 20 25*  CREATININE 0.93 0.85  GLUCOSE 135* 116*  CALCIUM 8.9 8.9   No results for input(s): LABPT, INR in the last 72 hours.  Physical Exam: Neurologically intact ABD soft Sensation intact distally Dorsiflexion/Plantar flexion intact Incision: Aquacell in place Compartment soft  Assessment/Plan: 2 Days Post-Op Procedure(s) (LRB): IRRIGATION AND DEBRIDIMENT LEFT HIP (Left) Advance diet Up with therapy WBAT with use of walker Infectious Disease consulted  Mayo, Darla Lesches for Dr. Melina Schools [facility] [phone] 04/05/2017, 9:05 AM

## 2017-04-05 NOTE — Progress Notes (Signed)
Subjective: No new complaints   Antibiotics:  Anti-infectives    Start     Dose/Rate Route Frequency Ordered Stop   04/03/17 1809  vancomycin (VANCOCIN) 1-5 GM/200ML-% IVPB    Comments:  Cochran, Ron   : cabinet override      04/03/17 1809 04/03/17 1833   04/03/17 1400  vancomycin (VANCOCIN) IVPB 1000 mg/200 mL premix     1,000 mg 200 mL/hr over 60 Minutes Intravenous Every 12 hours 04/03/17 1350     04/01/17 1200  ceFAZolin (ANCEF) IVPB 1 g/50 mL premix  Status:  Discontinued     1 g 100 mL/hr over 30 Minutes Intravenous Every 8 hours 04/01/17 1104 04/03/17 1338   04/01/17 0000  vancomycin (VANCOCIN) IVPB 1000 mg/200 mL premix  Status:  Discontinued     1,000 mg 200 mL/hr over 60 Minutes Intravenous Every 12 hours 03/31/17 1317 04/01/17 1104   03/28/17 1000  vancomycin (VANCOCIN) 1,250 mg in sodium chloride 0.9 % 250 mL IVPB  Status:  Discontinued     1,250 mg 166.7 mL/hr over 90 Minutes Intravenous Every 24 hours 03/28/17 0931 03/31/17 1317      Medications: Scheduled Meds: . aspirin EC  81 mg Oral Daily  . enoxaparin (LOVENOX) injection  40 mg Subcutaneous Q24H  . hydrocortisone cream   Topical BID  . lisinopril  10 mg Oral Daily  . mineral oil  1 enema Rectal Once  . multivitamin with minerals  1 tablet Oral Daily  . polyethylene glycol  17 g Oral BID  . pyridoxine  100 mg Oral Daily  . [START ON 04/06/2017] senna-docusate  1 tablet Oral QHS  . timolol  1 drop Both Eyes BID   Continuous Infusions: . vancomycin 1,000 mg (04/05/17 1411)   PRN Meds:.acetaminophen **OR** acetaminophen, cyclobenzaprine, HYDROcodone-acetaminophen, metoCLOPramide **OR** metoCLOPramide (REGLAN) injection, morphine injection, ondansetron **OR** ondansetron (ZOFRAN) IV, sodium chloride flush    Objective: Weight change:   Intake/Output Summary (Last 24 hours) at 04/05/17 2006 Last data filed at 04/05/17 1700  Gross per 24 hour  Intake              441 ml  Output                 0 ml  Net              441 ml   Blood pressure 128/72, pulse 75, temperature 98.5 F (36.9 C), temperature source Oral, resp. rate 18, height 5' 0.5" (1.537 m), weight 167 lb (75.8 kg), SpO2 100 %. Temp:  [98.5 F (36.9 C)-98.7 F (37.1 C)] 98.5 F (36.9 C) (07/01 1440) Pulse Rate:  [63-75] 75 (07/01 1440) Resp:  [18] 18 (07/01 1440) BP: (106-154)/(49-72) 128/72 (07/01 1440) SpO2:  [97 %-100 %] 100 % (07/01 1440)  Physical Exam: General: Alert and awake, oriented x3, not in any acute distress. HEENT: anicteric sclera, EOMI CVS regular rate, normal r,   Chest: no wheezes or resp distress Abdomen: soft  nondistended Extremities: no  clubbing or edema noted bilaterally  Neuro: nonfocal  Breast not examined today  CBC: _0 (wbc3,Hgb:3,Hct:3,Plt:3,INR:3APTT:3)@   BMET  Recent Labs  04/04/17 0853 04/05/17 0421  NA 138 144  K 5.5* 4.2  CL 102 105  CO2 27 30  GLUCOSE 135* 116*  BUN 20 25*  CREATININE 0.93 0.85  CALCIUM 8.9 8.9     Liver Panel  No results for input(s): PROT, ALBUMIN, AST, ALT, ALKPHOS, BILITOT, BILIDIR,  IBILI in the last 72 hours.     Sedimentation Rate No results for input(s): ESRSEDRATE in the last 72 hours. C-Reactive Protein No results for input(s): CRP in the last 72 hours.  Micro Results: Recent Results (from the past 720 hour(s))  MRSA PCR Screening     Status: None   Collection Time: 04/01/17  8:43 AM  Result Value Ref Range Status   MRSA by PCR NEGATIVE NEGATIVE Final    Comment:        The GeneXpert MRSA Assay (FDA approved for NASAL specimens only), is one component of a comprehensive MRSA colonization surveillance program. It is not intended to diagnose MRSA infection nor to guide or monitor treatment for MRSA infections.   Body fluid culture     Status: None (Preliminary result)   Collection Time: 04/02/17 11:32 AM  Result Value Ref Range Status   Specimen Description SYNOVIAL HIP  Final   Special Requests  Normal  Final   Gram Stain   Final    MODERATE WBC PRESENT, PREDOMINANTLY PMN NO ORGANISMS SEEN    Culture   Final    NO GROWTH 3 DAYS Performed at [facility], [place] [address] [place], [place] [address]    Report Status PENDING  Incomplete  Culture, blood (routine x 2)     Status: None (Preliminary result)   Collection Time: 04/03/17 10:38 AM  Result Value Ref Range Status   Specimen Description BLOOD RIGHT ANTECUBITAL  Final   Special Requests IN PEDIATRIC BOTTLE Blood Culture adequate volume  Final   Culture   Final    NO GROWTH 2 DAYS Performed at [facility], [place] [address] [place], [place] [address]    Report Status PENDING  Incomplete  Culture, blood (routine x 2)     Status: None (Preliminary result)   Collection Time: 04/03/17 10:43 AM  Result Value Ref Range Status   Specimen Description BLOOD BLOOD RIGHT HAND  Final   Special Requests IN PEDIATRIC BOTTLE Blood Culture adequate volume  Final   Culture   Final    NO GROWTH 2 DAYS Performed at [facility], [place] [address], [place], [place] [address]    Report Status PENDING  Incomplete  Aerobic/Anaerobic Culture (surgical/deep wound)     Status: None (Preliminary result)   Collection Time: 04/03/17  6:55 PM  Result Value Ref Range Status   Specimen Description WOUND L HIP SYNOVIAL FLUID  Final   Special Requests NONE  Final   Gram Stain   Final    RARE WBC PRESENT, PREDOMINANTLY MONONUCLEAR NO ORGANISMS SEEN    Culture   Final    NO GROWTH 2 DAYS Performed at [facility], [place] [address], [place], [place] [address]    Report Status PENDING  Incomplete  Aerobic/Anaerobic Culture (surgical/deep wound)     Status: None (Preliminary result)   Collection Time: 04/03/17  6:55 PM  Result Value Ref Range Status   Specimen Description TISSUE L HIP CAPSULE  Final   Special Requests NONE  Final   Gram Stain   Final    RARE WBC PRESENT,BOTH PMN AND MONONUCLEAR RARE GRAM POSITIVE COCCI IN  PAIRS    Culture   Final    NO GROWTH 2 DAYS Performed at [facility], [place] [address], [place], [place] [address]    Report Status PENDING  Incomplete    Studies/Results: No results found.    Assessment/Plan:  INTERVAL HISTORY:  pt was taken to the OR by Dr. Delfino Lovett on Friday, INtraoperatiev GS showed GP C pairs but cultures no growth (pt on abx previously)   Principal Problem:   Septic arthritis of hip ([place]) Active Problems:   Essential hypertension   History of breast cancer   Cellulitis of left breast    Briana Smith is a 76 y.o. female with hx of left breast cancer. She underwent lumpectomy,admitted with recurrent breast cellulitis and also found to have evidence of septic hip sp I and D by Dr. Delfino Lovett  #1 Septic hip  Cultures are not growing anything, likely because of antecedent antibiotics  Will plan on giving her 6 weeks of IV vancomycin with followup in our clinic  #2 Mastitis: is improving and she states much better today  #3 HTN: I dc her HCTZ to minimize risk of dehydration though her dose was very low   We will followup culture data but tentative plan is as follows:  Diagnosis: Septic hip  Culture Result: NO growth  No Known Allergies  OPAT Orders Discharge antibiotics: IV vancomycin Per pharmacy protocol vancomycin Aim for Vancomycin trough 15-20 (unless otherwise indicated) Duration: 6 weeks End Date:  August 9th  [place] Per Protocol:   BIWEEKLY LABS while on IV antibiotics:  X BMP  Labs weekly while on IV antibiotics: _x_ CBC with differential  _x_ CRP _x_ ESR x__ Vancomycin trough  _x_ Please pull PIC at completion of IV antibiotics __ Please leave PIC in place until doctor has seen patient or been notified  Fax weekly labs to [phone]  Clinic Follow Up Appt:  Next 4 weeks     LOS: 7 days   Alcide Evener 04/05/2017, 8:06 PM

## 2017-04-06 LAB — BODY FLUID CULTURE
CULTURE: NO GROWTH
Special Requests: NORMAL

## 2017-04-06 LAB — BASIC METABOLIC PANEL
ANION GAP: 6 (ref 5–15)
BUN: 20 mg/dL (ref 6–20)
CO2: 29 mmol/L (ref 22–32)
Calcium: 8.5 mg/dL — ABNORMAL LOW (ref 8.9–10.3)
Chloride: 105 mmol/L (ref 101–111)
Creatinine, Ser: 0.88 mg/dL (ref 0.44–1.00)
GFR calc Af Amer: >60 mL/min (ref >60)
GLUCOSE: 118 mg/dL — AB (ref 65–99)
POTASSIUM: 4.1 mmol/L (ref 3.5–5.1)
Sodium: 140 mmol/L (ref 135–145)

## 2017-04-06 LAB — VANCOMYCIN, TROUGH: Vancomycin Tr: 17 ug/mL (ref 15–20)

## 2017-04-06 MED ORDER — HYDROCODONE-ACETAMINOPHEN 5-325 MG PO TABS: 1.0000 | ORAL_TABLET | Freq: Four times a day (QID) | ORAL | 0 refills | Status: DC | PRN

## 2017-04-06 MED ORDER — LISINOPRIL 10 MG PO TABS: 10.0000 mg | ORAL_TABLET | Freq: Every day | ORAL | 0 refills | Status: DC

## 2017-04-06 MED ORDER — HEPARIN SOD (PORK) LOCK FLUSH 100 UNIT/ML IV SOLN
250.0000 [IU] | INTRAVENOUS | Status: AC | PRN
  Administered 2017-04-06: 250 [IU]

## 2017-04-06 MED ORDER — SENNOSIDES-DOCUSATE SODIUM 8.6-50 MG PO TABS: 1.0000 | ORAL_TABLET | Freq: Every evening | ORAL | 0 refills | Status: DC | PRN

## 2017-04-06 MED ORDER — VANCOMYCIN IV (FOR PTA / DISCHARGE USE ONLY): 1000.0000 mg | Freq: Two times a day (BID) | INTRAVENOUS | 0 refills | Status: DC

## 2017-04-06 NOTE — Care Management Important Message (Signed)
Important Message  Patient Details  Name: SIHAM BUCARO MRN: [id number] Date of Birth: 1941-02-23   Medicare Important Message Given:  Yes    Erenest Rasher, RN 04/06/2017, 1:09 PM

## 2017-04-06 NOTE — Progress Notes (Addendum)
Subjective:  Patient reports pain as mild to moderate.  States left hip pain is improving. PICC placed.  Objective:   VITALS:   Vitals:   04/05/17 1115 04/05/17 1440 04/05/17 2206 04/06/17 0452  BP: (!) 154/64 128/72 (!) 145/60 (!) 135/59  Pulse: 68 75 76 70  Resp:  18 19 18   Temp:  98.5 F (36.9 C) 98.2 F (36.8 C) 98.4 F (36.9 C)  TempSrc:  Oral Oral Oral  SpO2:  100% 98% 97%  Weight:      Height:        ABD soft Sensation intact distally Intact pulses distally Dorsiflexion/Plantar flexion intact Incision: dressing C/D/I Compartment soft Knee exam benign   Lab Results  Component Value Date   WBC 6.9 04/05/2017   HGB 9.4 (L) 04/05/2017   HCT 28.6 (L) 04/05/2017   MCV 91.7 04/05/2017   PLT 226 04/05/2017   BMET    Component Value Date/Time   NA 140 04/06/2017 0329   NA 141 01/31/2016 1002   K 4.1 04/06/2017 0329   K 4.8 01/31/2016 1002   CL 105 04/06/2017 0329   CL 104 01/13/2013 1101   CO2 29 04/06/2017 0329   CO2 28 01/31/2016 1002   GLUCOSE 118 (H) 04/06/2017 0329   GLUCOSE 62 (L) 01/31/2016 1002   GLUCOSE 103 (H) 01/13/2013 1101   BUN 20 04/06/2017 0329   BUN 17.1 01/31/2016 1002   CREATININE 0.88 04/06/2017 0329   CREATININE 0.9 01/31/2016 1002   CALCIUM 8.5 (L) 04/06/2017 0329   CALCIUM 9.5 01/31/2016 1002   GFRNONAA >60 04/06/2017 0329   GFRAA >60 04/06/2017 0329    Recent Results (from the past 240 hour(s))  MRSA PCR Screening     Status: None   Collection Time: 04/01/17  8:43 AM  Result Value Ref Range Status   MRSA by PCR NEGATIVE NEGATIVE Final    Comment:        The GeneXpert MRSA Assay (FDA approved for NASAL specimens only), is one component of a comprehensive MRSA colonization surveillance program. It is not intended to diagnose MRSA infection nor to guide or monitor treatment for MRSA infections.   Body fluid culture     Status: None (Preliminary result)   Collection Time: 04/02/17 11:32 AM  Result Value Ref Range  Status   Specimen Description SYNOVIAL HIP  Final   Special Requests Normal  Final   Gram Stain   Final    MODERATE WBC PRESENT, PREDOMINANTLY PMN NO ORGANISMS SEEN    Culture   Final    NO GROWTH 3 DAYS Performed at [facility], [place] [address] [place], [place] [address]    Report Status PENDING  Incomplete  Culture, blood (routine x 2)     Status: None (Preliminary result)   Collection Time: 04/03/17 10:38 AM  Result Value Ref Range Status   Specimen Description BLOOD RIGHT ANTECUBITAL  Final   Special Requests IN PEDIATRIC BOTTLE Blood Culture adequate volume  Final   Culture   Final    NO GROWTH 2 DAYS Performed at [facility], [place] [address] [place], [place] [address]    Report Status PENDING  Incomplete  Culture, blood (routine x 2)     Status: None (Preliminary result)   Collection Time: 04/03/17 10:43 AM  Result Value Ref Range Status   Specimen Description BLOOD BLOOD RIGHT HAND  Final   Special Requests IN PEDIATRIC BOTTLE Blood Culture adequate volume  Final  Culture   Final    NO GROWTH 2 DAYS Performed at [facility], [place] [address], [place], [place] [address]    Report Status PENDING  Incomplete  Aerobic/Anaerobic Culture (surgical/deep wound)     Status: None (Preliminary result)   Collection Time: 04/03/17  6:55 PM  Result Value Ref Range Status   Specimen Description WOUND L HIP SYNOVIAL FLUID  Final   Special Requests NONE  Final   Gram Stain   Final    RARE WBC PRESENT, PREDOMINANTLY MONONUCLEAR NO ORGANISMS SEEN    Culture   Final    NO GROWTH 2 DAYS Performed at [facility], [place] [address] [place], [place] [address]    Report Status PENDING  Incomplete  Aerobic/Anaerobic Culture (surgical/deep wound)     Status: None (Preliminary result)   Collection Time: 04/03/17  6:55 PM  Result Value Ref Range Status   Specimen Description TISSUE L HIP CAPSULE  Final   Special Requests NONE  Final   Gram Stain   Final    RARE  WBC PRESENT,BOTH PMN AND MONONUCLEAR RARE GRAM POSITIVE COCCI IN PAIRS    Culture   Final    NO GROWTH 2 DAYS Performed at [facility], [place] [address], [place], [place] [address]    Report Status PENDING  Incomplete      Assessment/Plan: 3 Days Post-Op   Principal Problem:   Septic arthritis of hip ([place]) Active Problems:   Essential hypertension   History of breast cancer   Cellulitis of left breast   WBAT with walker Inraop cultures (6/29): gram stain (+) with GPCs, culture NGTD DVT ppx: Lovenox in house - home on ASA 81 mg PO BID, SCDs, TEDs IV abx per ID PO pain control PT/OT Dispo: will need prolonged IV abx, d/c home when medically ready    Briana Smith, Horald Pollen 04/06/2017, 7:40 AM   Rod Can, MD Cell [phone]

## 2017-04-06 NOTE — Progress Notes (Signed)
PHARMACY CONSULT NOTE FOR:  OUTPATIENT  PARENTERAL ANTIBIOTIC THERAPY (OPAT)  Indication: Septic arthritis of hip Regimen: Vancomycin 1g IV q12h End date: May 14, 2017  IV antibiotic discharge orders are pended. To discharging provider:  please sign these orders via discharge navigator,  Select New Orders & click on the button choice - Manage This Unsigned Work.     Thank you for allowing pharmacy to be a part of this patient's care.   Lindell Spar, PharmD, BCPS Pager: [phone] 04/06/2017 3:33 PM

## 2017-04-06 NOTE — Evaluation (Signed)
Occupational Therapy Evaluation Patient Details Name: Briana Smith MRN: [id number] DOB: [birth year] Today's Date: 04/06/2017    History of Present Illness Briana Smith is a 76 y.o. female with medical history significant of left breast cancer status post lumpectomy and chemoradiation, history of left breast cellulitis about 2 years ago and hypertension. Patient reported she developed fever, sweating and breast soreness about 1 AM this morning, when she checked her breast it was very erythematous and tender so she came to the hospital for further evaluation; In addition pt developed L  hip /LE pain; xrays and CT negative; MRI = muscle edema and small L hip effusion; pt pain continued to worsen and now s/p  I&D L hip  6/29 per Dr. Lyla Glassing   Clinical Impression   OT education complete    Follow Up Recommendations  No OT follow up    Equipment Recommendations  None recommended by OT       Precautions / Restrictions Precautions Precautions: Fall Restrictions Weight Bearing Restrictions: No      Mobility Bed Mobility Overal bed mobility: Needs Assistance Bed Mobility: Supine to Sit;Sit to Supine     Supine to sit: Min guard Sit to supine: Min guard   General bed mobility comments: Husband assisted pt at her request.  Transfers Overall transfer level: Needs assistance Equipment used: Rolling walker (2 wheeled) Transfers: Sit to/from Stand Sit to Stand: Supervision         General transfer comment: for safety. Increased time.         ADL either performed or assessed with clinical judgement   ADL Overall ADL's : Needs assistance/impaired Eating/Feeding: Independent   Grooming: Wash/dry face;Sitting   Upper Body Bathing: Supervision/ safety;Sitting   Lower Body Bathing: Minimal assistance;Sit to/from stand;Cueing for safety;Cueing for sequencing   Upper Body Dressing : Supervision/safety;Sitting   Lower Body Dressing: Minimal assistance;Sit to/from  stand;Cueing for safety;Cueing for sequencing   Toilet Transfer: Min guard;RW;Ambulation   Toileting- Clothing Manipulation and Hygiene: Supervision/safety;Sit to/from Nurse, children's Details (indicate cue type and reason): verbalized safety Functional mobility during ADLs: Min guard;Set up       Vision Patient Visual Report: No change from baseline       Perception     Praxis      Pertinent Vitals/Pain Pain Assessment: 0-10 Pain Score: 4  Faces Pain Scale: Hurts little more Pain Location: L hip/groin Pain Descriptors / Indicators: Sore;Grimacing Pain Intervention(s): Limited activity within patient's tolerance;Repositioned        Extremity/Trunk Assessment Upper Extremity Assessment Upper Extremity Assessment: Overall WFL for tasks assessed           Communication Communication Communication: No difficulties   Cognition Arousal/Alertness: Awake/alert Behavior During Therapy: WFL for tasks assessed/performed Overall Cognitive Status: Within Functional Limits for tasks assessed                                                Home Living Family/patient expects to be discharged to:: Private residence Living Arrangements: Spouse/significant other   Type of Home: House Home Access: Stairs to enter     Home Layout: Able to live on main level with bedroom/bathroom;Two level     Bathroom Shower/Tub: Chief Strategy Officer: Environmental consultant - 2 wheels  Prior Functioning/Environment Level of Independence: Independent                         OT Goals(Current goals can be found in the care plan section) Acute Rehab OT Goals Patient Stated Goal: less pain OT Goal Formulation: With patient  OT Frequency:     Barriers to D/C:               AM-PAC PT "6 Clicks" Daily Activity     Outcome Measure Help from another person eating meals?: None Help from another person taking care of personal  grooming?: None Help from another person toileting, which includes using toliet, bedpan, or urinal?: A Little Help from another person bathing (including washing, rinsing, drying)?: A Little Help from another person to put on and taking off regular upper body clothing?: A Little Help from another person to put on and taking off regular lower body clothing?: A Little 6 Click Score: 20   End of Session Nurse Communication: Mobility status  Activity Tolerance: Patient tolerated treatment well Patient left: in bed;with call bell/phone within reach  OT Visit Diagnosis: Unsteadiness on feet (R26.81)                Time: 5498-2641 OT Time Calculation (min): 34 min Charges:  OT General Charges $OT Visit: 1 Procedure OT Evaluation $OT Eval Moderate Complexity: 1 Procedure OT Treatments $Self Care/Home Management : 8-22 mins G-Codes:     Kari Baars, OT [phone]  Payton Mccallum D 04/06/2017, 1:43 PM

## 2017-04-06 NOTE — Progress Notes (Addendum)
Pharmacy Antibiotic Note  Briana Smith is a 75 y.o. female admitted on 03/28/2017 with left breast cellulitis. Over the course of admission, pt reported significant left groin pain. MRI showed a left hip joint effusion. ID suspects left hip septic arthritis. Pharmacy has been consulted for vancomycin dosing. Per ID, plan is for 6 weeks of Vancomycin therapy .    Plan:  Continue present dosing of Vancomycin for now.   Check Vancomycin trough level prior to next dose. Goal trough level 15-20 mcg/mL.   Continue to monitor renal function, cultures, clinical course.    Addendum: Vancomycin trough level returned at 17 mcg/mL.  Plan: Will continue with current regimen of Vancomycin 1g IV q12h. See OPAT orders for discharge.   Lindell Spar, PharmD, BCPS Pager: [phone] 04/06/2017 3:21 PM   Height: 5' 0.5" (153.7 cm) Weight: 167 lb (75.8 kg) IBW/kg (Calculated) : 46.65  Temp (24hrs), Avg:98.4 F (36.9 C), Min:98.2 F (36.8 C), Max:98.5 F (36.9 C)   Recent Labs Lab 03/31/17 1054 04/01/17 0445 04/02/17 0622 04/03/17 0550 04/04/17 0853 04/05/17 0421 04/06/17 0329  WBC 7.2 5.4 5.1 4.8 8.0 6.9  --   CREATININE 0.74 0.72 0.77 0.71 0.93 0.85 0.88  VANCOTROUGH 4*  --   --   --   --   --   --     Estimated Creatinine Clearance: 48.5 mL/min (by C-G formula based on SCr of 0.88 mg/dL).    No Known Allergies  Antimicrobials this admission: 6/23 Vancomycin >> 6/26, resumed 6/29 >> 6/26 Cefazolin >> 6/29  Dose adjustments this admission: 6/26 1054 VT = 4 mcg/mL (drawn 2 hours late) on 1250mg  IV q24h 7/2 1430 VT ordered  Microbiology results: 6/27 MRSA PCR: negative 6/28 hip synovial fluid (aspirated): NGTD 6/29 hip synovial fluid (I&D): NGTD 6/29 hip tissue (I&D): rare GPCs in pairs 6/29 BCx: NGTD   Thank you for allowing pharmacy to be a part of this patient's care.   Lindell Spar, PharmD, BCPS Pager: [phone] 04/06/2017 10:57 AM

## 2017-04-06 NOTE — Progress Notes (Signed)
Physical Therapy Treatment Patient Details Name: Briana Smith MRN: [id number] DOB: 1941-05-23 Today's Date: 04/06/2017    History of Present Illness Briana Smith is a 76 y.o. female with medical history significant of left breast cancer status post lumpectomy and chemoradiation, history of left breast cellulitis about 2 years ago and hypertension. Patient reported she developed fever, sweating and breast soreness about 1 AM this morning, when she checked her breast it was very erythematous and tender so she came to the hospital for further evaluation; In addition pt developed L  hip /LE pain; xrays and CT negative; MRI = muscle edema and small L hip effusion; pt pain continued to worsen and now s/p  I&D L hip  6/29 per Dr. Lyla Glassing    PT Comments    Progressing slowly with mobility. Pt is still experiencing some pain, mostly during bed mobility and transitional movements. Recommend daily ambulation in hallway with nursing supervision/assist.   Follow Up Recommendations  No PT follow up: Supervision for mobility/OOB     Equipment Recommendations  None recommended by PT    Recommendations for Other Services       Precautions / Restrictions Precautions Precautions: Fall Restrictions Weight Bearing Restrictions: No    Mobility  Bed Mobility Overal bed mobility: Needs Assistance Bed Mobility: Supine to Sit;Sit to Supine     Supine to sit: Min assist Sit to supine: Min assist   General bed mobility comments: Husband assisted pt at her request.  Transfers Overall transfer level: Needs assistance Equipment used: Rolling walker (2 wheeled) Transfers: Sit to/from Stand Sit to Stand: Supervision         General transfer comment: for safety. Increased time.   Ambulation/Gait Ambulation/Gait assistance: Min guard Ambulation Distance (Feet): 125 Feet Assistive device: Rolling walker (2 wheeled) Gait Pattern/deviations: Step-through pattern;Decreased stride length      General Gait Details: slow gait speed. close guard for safety. LOB x1 while getting to bathroom sink to wash hands.    Stairs            Wheelchair Mobility    Modified Rankin (Stroke Patients Only)       Balance                                            Cognition Arousal/Alertness: Awake/alert Behavior During Therapy: WFL for tasks assessed/performed Overall Cognitive Status: Within Functional Limits for tasks assessed                                        Exercises      General Comments        Pertinent Vitals/Pain Pain Assessment: Faces Faces Pain Scale: Hurts even more Pain Location: L hip/groin Pain Descriptors / Indicators: Sore;Guarding;Grimacing Pain Intervention(s): Monitored during session    Home Living                      Prior Function            PT Goals (current goals can now be found in the care plan section) Progress towards PT goals: Progressing toward goals    Frequency           PT Plan Current plan remains appropriate    Co-evaluation  AM-PAC PT "6 Clicks" Daily Activity  Outcome Measure  Difficulty turning over in bed (including adjusting bedclothes, sheets and blankets)?: Total Difficulty moving from lying on back to sitting on the side of the bed? : Total Difficulty sitting down on and standing up from a chair with arms (e.g., wheelchair, bedside commode, etc,.)?: A Little Help needed moving to and from a bed to chair (including a wheelchair)?: A Little Help needed walking in hospital room?: A Little Help needed climbing 3-5 steps with a railing? : A Little 6 Click Score: 14    End of Session   Activity Tolerance: Patient tolerated treatment well Patient left: in bed;with call bell/phone within reach;with family/visitor present   PT Visit Diagnosis: Muscle weakness (generalized) (M62.81);Difficulty in walking, not elsewhere classified (R26.2)      Time: 4818-5909 PT Time Calculation (min) (ACUTE ONLY): 13 min  Charges:  $Gait Training: 8-22 mins                    G Codes:         Weston Anna, MPT Pager: [phone]

## 2017-04-06 NOTE — Progress Notes (Signed)
Received report from the morning Rn morning assessment is unvchanged. Pt denies pain at this time with no s/s of distress noted. Will continue to monitor.

## 2017-04-06 NOTE — Discharge Summary (Signed)
Discharge Summary  Briana Smith WUJ:[id number] DOB: 1940-10-24  PCP: [name], MD  Admit date: 03/28/2017 Discharge date: 04/06/2017  Time spent: >25mns, more than 50% time spent on coordination of care  Recommendations for Outpatient Follow-up:  1. F/u with PMD within a week  for hospital discharge follow up, repeat cbc/bmp at follow up 2. F/u with orthopedics 3.  f/u with infectious disease 4. Home health arranged  Discharge Diagnoses:  Active Hospital Problems   Diagnosis Date Noted  . [place]ic [place] of [place] ([place] 04/03/2017  . Cellulitis of left breast 03/28/2017  . History of breast cancer 06/30/2013  . Essential hypertension 08/03/2008    Resolved Hospital Problems   Diagnosis Date Noted Date Resolved  No resolved problems to display.    Discharge Condition: stable  Diet recommendation: heart healthy  Filed Weights   03/28/17 0743  Weight: 75.8 kg (167 lb)    History of present illness:  PCP: HMarin Olp MD  Patient coming from: Home  Chief Complaint: "My breast is red as a beet"  HPI: Briana LASKOWSKIis a 76y.o. female with medical history significant of left breast cancer status post lumpectomy and chemoradiation, history of left breast cellulitis about 2 years ago and hypertension. Patient reported she developed fever, sweating and breast soreness about 1 AM this morning, when she checked her breast it was very erythematous and tender so she came to the hospital for further evaluation.  ED Course:  Vitals: WNL Labs: Leukocytosis Imaging: [place]ending Interventions:   Hospital Course:  Principal Problem:   Septic arthritis of hip ([place] Active Problems:   Essential hypertension   History of breast cancer   Cellulitis of left breast   Cellulitis of the left breast (presenting symptom) - she received iv vancomycin since admission. Cultures negative so far, mrsa screening negative -Breast ultrasound did not show any  abscess -finished 10days abx with vanc then ancef than vanc,   left hip septic arthritis:  -MRI left hip concerns for left hip joint effusion,   - left hip arthrocentesis by IR on 6/28, synovial fluids turbid with 97% neutrophil, culture no growth,  --s/p Open arthrotomy and drainage of left hip on 6/29, left hip synovial fluids no growth but left hip capsule culture +GPC in pairs, final culture no growth, blood culture no growth -abx Vancomycin from 03/28/2017 to 6/27, Ancef from 6/27 to 6/29, Restarted vanc on 6/29, s/p picc line placement, she is discharged to home with iv vanc to finish total of 6 weeks of abx therapy. - ortho and infectious disease consulted, input appreciated, patient is to follow with ortho and ID outpatient closely.  Leukocytosis/thrombocytopenia Wbc 17.4 , plt 135 on admission - Probably reactive; resolved  History of breast cancer -Status post lumpectomy, chemotherapy and radiation. Report last memogram in 10/2016 which is unremarkable  Essential hypertension -at home on combination pill with lisinopril 258mnd HCTZ. - HCTZ stopped, she is discharged on lisinopril 1059maily  Nausea and vomiting -Probably from cellulitis. - Resolved  Constipation: resolved with stool regimen  DVT prophylaxis:sq lovenox Code Status:Full code Family Communication:Discussed with husband and daughter at bedside Disposition Plan:home with home health  [facility] Infectious disease  Procedures: lower extremity duplex ultrasound on 03/29/2017: Negative for DVT Left hip arthrocentesis on 6/28 s/p Open arthrotomy and drainage of left hip on 6/29 picc line placement on 6/30  Antimicrobials:  Vancomycin from 03/28/2017 to 6/27 Ancef from 6/27 to 6/29  Restarted vanc on 6/29    Discharge Exam: BP (!) 135/59 (BP Location: Right Leg)   Pulse 70   Temp 98.4 F (36.9 C) (Oral)   Resp 18   Ht  5' 0.5" (1.537 m)   Wt 75.8 kg (167 lb)   SpO2 97%   BMI 32.08 kg/m   General: NAD Cardiovascular: RRR Respiratory: CTABL Gastrointestinal system: Abdomen is nondistended, soft and nontender. Normal bowel sounds heard. Extremities: Left hip post op changes, dressing intact, nontender. No cyanosis, clubbing, edema Skin: left breast erythema has almost resolved, tenderness has resolved.  Discharge Instructions You were cared for by a hospitalist during your hospital stay. If you have any questions about your discharge medications or the care you received while you were in the hospital after you are discharged, you can call the unit and asked to speak with the hospitalist on call if the hospitalist that took care of you is not available. Once you are discharged, your primary care physician will handle any further medical issues. Please note that NO REFILLS for any discharge medications will be authorized once you are discharged, as it is imperative that you return to your primary care physician (or establish a relationship with a primary care physician if you do not have one) for your aftercare needs so that they can reassess your need for medications and monitor your lab values.  Discharge Instructions    Diet - low sodium heart healthy    Complete by:  As directed    Face-to-face encounter (required for Medicare/Medicaid patients)    Complete by:  As directed    I Anthony Roland certify that this patient is under my care and that I, or a nurse practitioner or physician's assistant working with me, had a face-to-face encounter that meets the physician face-to-face encounter requirements with this patient on 04/05/2017. The encounter with the patient was in whole, or in part for the following medical condition(s) which is the primary reason for home health care (List medical condition):home  IV abx infusion   The encounter with the patient was in whole, or in part, for the following medical condition,  which is the primary reason for home health care:  FTT   I certify that, based on my findings, the following services are medically necessary home health services:  Nursing   Reason for Medically Necessary Home Health Services:  Skilled Nursing- Change/Decline in Patient Status   My clinical findings support the need for the above services:  Pain interferes with ambulation/mobility   Further, I certify that my clinical findings support that this patient is homebound due to:  Immunocompromised   Home Health    Complete by:  As directed    To provide the following care/treatments:  RN   Home infusion instructions [facility] May follow [place] Dosing Protocol; May administer Cathflo as needed to maintain patency of vascular access device.; Flushing of vascular access device: per [facility] Protocol: 0.9% NaCl pre/post medica...    Complete by:  As directed    Instructions:  May follow [place] Dosing Protocol   Instructions:  May administer Cathflo as needed to maintain patency of vascular access device.   Instructions:  Flushing of vascular access device: per [facility] Protocol: 0.9% NaCl pre/post medication administration and prn patency; Heparin 100 u/ml, 78m for implanted ports and Heparin 10u/ml, 572mfor all other central venous catheters.   Instructions:  May follow [facility] Anaphylaxis Protocol for First Dose Administration in the home: 0.9% NaCl  at 25-50 ml/hr to maintain IV access for protocol meds. Epinephrine 0.3 ml IV/IM PRN and Benadryl 25-50 IV/IM PRN s/s of anaphylaxis.   Instructions:  [place] Infusion Coordinator (RN) to assist per patient IV care needs in the home PRN.   Increase activity slowly    Complete by:  As directed      Allergies as of 04/06/2017   No Known Allergies     Medication List    STOP taking these medications   lisinopril-hydrochlorothiazide 20-12.5 MG tablet Commonly known as:  PRINZIDE,ZESTORETIC   potassium chloride 10 MEQ tablet Commonly  known as:  K-DUR     TAKE these medications   aspirin 81 MG tablet Take 81 mg by mouth daily.   CYANOCOBALAMIN PO Take 1 tablet by mouth daily.   HYDROcodone-acetaminophen 5-325 MG tablet Commonly known as:  NORCO/VICODIN Take 1 tablet by mouth every 6 (six) hours as needed for moderate pain.   lisinopril 10 MG tablet Commonly known as:  PRINIVIL,ZESTRIL Take 1 tablet (10 mg total) by mouth daily. Start taking on:  04/07/2017   multivitamin tablet Take 1 tablet by mouth daily.   pyridoxine 100 MG tablet Commonly known as:  B-6 Take 100 mg by mouth daily.   senna-docusate 8.6-50 MG tablet Commonly known as:  Senokot-S Take 1 tablet by mouth at bedtime as needed for mild constipation.   timolol 0.5 % ophthalmic solution Commonly known as:  TIMOPTIC BID times 48H.   vancomycin IVPB Inject 1,000 mg into the vein every 12 (twelve) hours. Indication: Septic arthritis of hip Last Day of Therapy:  May 14, 2017 Labs - 'Sunday/Monday:  CBC/D, BMP Labs - Thursday:  BMP and vancomycin trough Labs - Every week:  ESR and CRP Start taking on:  04/07/2017            Home Infusion Instuctions        Start     Ordered   04/06/17 0000  Home infusion instructions [facility] May follow [facility] Dosing Protocol; May administer Cathflo as needed to maintain patency of vascular access device.; Flushing of vascular access device: per [facility] Protocol: 0.9% NaCl pre/post medica...    Question Answer Comment  Instructions May follow [facility] Dosing Protocol   Instructions May administer Cathflo as needed to maintain patency of vascular access device.   Instructions Flushing of vascular access device: per [facility] Protocol: 0.9% NaCl pre/post medication administration and prn patency; Heparin 100 u/ml, 5ml for implanted ports and Heparin 10u/ml, 5ml for all other central venous catheters.   Instructions May follow [facility] Anaphylaxis Protocol for First Dose Administration in the home: 0.9%  NaCl at 25-50 ml/hr to maintain IV access for protocol meds. Epinephrine 0.3 ml IV/IM PRN and Benadryl 25-50 IV/IM PRN s/s of anaphylaxis.   Instructions [facility] Infusion Coordinator (RN) to assist per patient IV care needs in the home PRN.      07' /02/18 1558     No Known Allergies Follow-up Information    Swinteck, Aaron Edelman, MD. Schedule an appointment as soon as possible for a visit in 2 week(s).   Specialty:  Orthopedic Surgery Why:  For suture removal Contact information: [address] [place] [place] [address] [phone]        [name], MD Follow up in 1 week(s).   Specialty:  Family Medicine Why:  hospital discharge follow up Contact information: [place] [address] [phone]  Health, [facility]-Home Follow up.   Why:  Home Health RN Contact information: [address] [place] [address] [phone]        [facility]              Follow up in 4 week(s).   Contact information: [place] [phone]           The results of significant diagnostics from this hospitalization (including imaging, microbiology, ancillary and laboratory) are listed below for reference.    Significant Diagnostic Studies: [place] Chest  Result Date: 03/28/2017 CLINICAL DATA:  76 year old female with a history of breast carcinoma. Concern for cellulitis or abscess. EXAM: CHEST ULTRASOUND COMPARISON:  None. FINDINGS: Limited ultrasound of the left chest in the region of clinical concern, with grayscale and color duplex performed. No focal fluid collection identified. IMPRESSION: Limited ultrasound of the left chest in the region of clinical concern demonstrates no focal fluid collection. Given the history of prior breast cancer and now left breast/chest wall symptoms, a complete outpatient oncologic workup as well as mammography workup  should be considered. Electronically Signed   By: Corrie Mckusick D.O.   On: 03/28/2017 12:26   Ct Femur Left Wo Contrast  Result Date: 03/30/2017 CLINICAL DATA:  Left hip pain.  No known injury. EXAM: CT OF THE LOWER LEFT EXTREMITY WITHOUT CONTRAST TECHNIQUE: Multidetector CT imaging of the lower left extremity was performed according to the standard protocol. COMPARISON:  None. FINDINGS: Bones/Joint/Cartilage No fracture or dislocation. Normal alignment. Left hip joint space is maintained. Left knee joint spaces are maintained. No left knee joint effusion. No aggressive lytic or sclerotic osseous lesion. Ligaments Ligaments are suboptimally evaluated by CT. Muscles and Tendons Muscles are normal. No intramuscular fluid collection or hematoma. No muscle atrophy. Soft tissue No fluid collection or hematoma. No soft tissue mass. Mild peripheral vascular atherosclerotic disease. IMPRESSION: No acute osseous injury of the left femur. Electronically Signed   By: Kathreen Devoid   On: 03/30/2017 13:20   Mr Hip Left Wo Contrast  Result Date: 04/02/2017 CLINICAL DATA:  Left hip pain. EXAM: MR OF THE LEFT HIP WITHOUT CONTRAST TECHNIQUE: Multiplanar, multisequence MR imaging was performed. No intravenous contrast was administered. COMPARISON:  CT left hip 03/30/2017 FINDINGS: Bones: No hip fracture, dislocation or avascular necrosis. No periosteal reaction or bone destruction. No aggressive osseous lesion. Normal sacrum and sacroiliac joints. No SI joint widening or erosive changes. Disc desiccation at L5-S1. Articular cartilage and labrum Articular cartilage:  No chondral defect. Labrum: Grossly intact, but evaluation is limited by lack of intraarticular fluid. Joint or bursal effusion Joint effusion: Small left hip joint effusion. No right hip joint effusion. No SI joint effusion. Bursae:  No bursa formation. Muscles and tendons Flexors: Normal. Extensors: Normal. Abductors: Mild muscle edema within the left operator  externus, pectineus and adductor brevis muscles most concerning for muscle strain versus mild myositis. No tendon tear. Adductors: Normal. Gluteals: Mild muscle edema in the right gluteus minimus muscle. Mild muscle edema in the left gluteus minimus muscle. Mild edema surrounding the proximal left vastus lateralis muscle. Hamstrings: Normal. Other findings Miscellaneous: Small amount of pelvic free fluid. Prior hysterectomy. No fluid collection or hematoma. No inguinal lymphadenopathy. No inguinal hernia. IMPRESSION: 1. Mild muscle edema within the bilateral gluteus minimus muscles, left obturator externus, left pectineus and left adductor brevis muscles most concerning for muscle strain versus mild myositis versus reactive inflammatory changes secondary  to left hip joint effusion. 2. Nonspecific left hip joint effusion. If there is concern regarding septic arthritis, recommend arthrocentesis. 3. Electronically Signed   By: Kathreen Devoid   On: 04/02/2017 08:32   Dg Fluoro Guided Needle Plc Aspiration/injection Loc  Addendum Date: 04/03/2017   ADDENDUM REPORT: 04/03/2017 21:06 ADDENDUM: The 2nd to last findings paragraph should read: "Before withdrawing the needle, 2 mL of Isovue 370 was injected and demonstrated an intra-ARTICULAR spread of contrast." Electronically Signed   By: Genevie Ann M.D.   On: 04/03/2017 21:06   Result Date: 04/03/2017 CLINICAL DATA:  76 y/o female with unexplained severe left hip pain following admission for breast cellulitis. Abnormal hip MRI including left hip joint effusion. Ultrasound guided left hip aspiration requested. EXAM: LEFT HIP ASPIRATION UNDER FLUOROSCOPY FLUOROSCOPY TIME:  Fluoroscopy Time:  0 minutes 29 seconds Radiation Exposure Index (if provided by the fluoroscopic device): 7.26 mGy Number of Acquired Spot Images: 0 PROCEDURE: The rationale for the procedure including risks and benefits was discussed with the patient and informed consent was obtained. A "time-out" was  performed. The patient reported severe pain in the left hip when lying with the leg flat, and the left hip in a neutral position. And she was holding her left hip flexed which offered pain relief. Therefore, she initially refused fluoroscopic left hip aspiration in the usual neutral position. I agreed to attempt a left hip aspiration with her hip flexed, but advised that the fluoroscopic approach to the left hip joint may not be feasible in that position. Overlying skin site was identified fluoroscopically and the Overlying skin prepped with Betadine, draped in the usual sterile fashion, and infiltrated locally with buffered Lidocaine. Curved 3.5 inch x 20 gauge spinal needle advanced toward the left hip joint with intermittent fluoroscopy, but it soon became apparent that with the left hip significantly flexed, the joint space could not be approached. She agreed for me to briefly attempt a left hip aspiration with the typical neutral position of the left hip. She moved her left hip into the neutral position which did cause significant pain. Under fluoroscopic guidance the spinal needle was then advanced along the left femoral neck to the margin of the left femoral head. Aspiration with a 10 cc syringe then yielded about 2 mm of viscous serosanguineous fluid. Additional aspiration with a second 10 cc syringe yielded only trace additional fluid. Before withdrawing the needle, 2 mL of Isovue 370 was injected and demonstrated an intra-arterial spread of contrast. The needle was withdrawn, direct pressure was held, and hemostasis was noted. The patient tolerated the procedure, and was in stable condition at its conclusion. IMPRESSION: Successful fluoroscopic guided left hip joint aspiration, yielding a 2 mL of viscous serosanguineous fluid. This was sent to the lab for analysis. Electronically Signed: By: Genevie Ann M.D. On: 04/02/2017 16:45   Dg Hip Unilat With Pelvis 2-3 Views Left  Result Date: 03/29/2017 CLINICAL  DATA:  Sudden onset LEFT flank pain EXAM: DG HIP (WITH OR WITHOUT PELVIS) 2-3V LEFT COMPARISON:  None. FINDINGS: Hips are located. No evidence of pelvic fracture or sacral fracture. Dedicated view of the LEFT hip demonstrates no femoral neck fracture. IMPRESSION: No pelvic fracture or LEFT hip fracture. Electronically Signed   By: Suzy Bouchard M.D.   On: 03/29/2017 10:34    Microbiology: Recent Results (from the past 240 hour(s))  MRSA PCR Screening     Status: None   Collection Time: 04/01/17  8:43 AM  Result Value  Ref Range Status   MRSA by PCR NEGATIVE NEGATIVE Final    Comment:        The GeneXpert MRSA Assay (FDA approved for NASAL specimens only), is one component of a comprehensive MRSA colonization surveillance program. It is not intended to diagnose MRSA infection nor to guide or monitor treatment for MRSA infections.   Body fluid culture     Status: None   Collection Time: 04/02/17 11:32 AM  Result Value Ref Range Status   Specimen Description SYNOVIAL HIP  Final   Special Requests Normal  Final   Gram Stain   Final    MODERATE WBC PRESENT, PREDOMINANTLY PMN NO ORGANISMS SEEN    Culture   Final    NO GROWTH 3 DAYS Performed at [facility], [address], [place], [place] [address]    Report Status 04/06/2017 FINAL  Final  Culture, blood (routine x 2)     Status: None (Preliminary result)   Collection Time: 04/03/17 10:38 AM  Result Value Ref Range Status   Specimen Description BLOOD RIGHT ANTECUBITAL  Final   Special Requests IN PEDIATRIC BOTTLE Blood Culture adequate volume  Final   Culture   Final    NO GROWTH 3 DAYS Performed at [facility], [place] [address], [place], [place] [address]    Report Status PENDING  Incomplete  Culture, blood (routine x 2)     Status: None (Preliminary result)   Collection Time: 04/03/17 10:43 AM  Result Value Ref Range Status   Specimen Description BLOOD BLOOD RIGHT HAND  Final   Special Requests IN PEDIATRIC  BOTTLE Blood Culture adequate volume  Final   Culture   Final    NO GROWTH 3 DAYS Performed at [facility], [place] [address] [place], [place] [address]    Report Status PENDING  Incomplete  Aerobic/Anaerobic Culture (surgical/deep wound)     Status: None (Preliminary result)   Collection Time: 04/03/17  6:55 PM  Result Value Ref Range Status   Specimen Description WOUND L HIP SYNOVIAL FLUID  Final   Special Requests NONE  Final   Gram Stain   Final    RARE WBC PRESENT, PREDOMINANTLY MONONUCLEAR NO ORGANISMS SEEN    Culture   Final    NO GROWTH 2 DAYS NO ANAEROBES ISOLATED; CULTURE IN PROGRESS FOR 5 DAYS Performed at [facility], [place] [address], [place], [place] [address]    Report Status PENDING  Incomplete  Aerobic/Anaerobic Culture (surgical/deep wound)     Status: None (Preliminary result)   Collection Time: 04/03/17  6:55 PM  Result Value Ref Range Status   Specimen Description TISSUE L HIP CAPSULE  Final   Special Requests NONE  Final   Gram Stain   Final    RARE WBC PRESENT,BOTH PMN AND MONONUCLEAR RARE GRAM POSITIVE COCCI IN PAIRS    Culture   Final    NO GROWTH 2 DAYS NO ANAEROBES ISOLATED; CULTURE IN PROGRESS FOR 5 DAYS Performed at [facility], [place] [address] [place], [place] [address]    Report Status PENDING  Incomplete     Labs: Basic Metabolic Panel:  Recent Labs Lab 03/31/17 1054 04/01/17 0445 04/02/17 0622 04/03/17 0550 04/04/17 0853 04/05/17 0421 04/06/17 0329  NA 136 140 142 141 138 144 140  K 4.2 3.8 3.8 3.8 5.5* 4.2 4.1  CL 103 107 104 106 102 105 105  CO2 '25 27 29 27 27 30 ' 29  GLUCOSE 149* 110* 106* 109* 135* 116* 118*  BUN '10 13 15 13 20 ' 25* 20  CREATININE 0.74 0.72 0.77 0.71 0.93 0.85 0.88  CALCIUM 8.6* 8.4* 9.0 8.8* 8.9 8.9 8.5*  MG 2.2 2.0  --   --  2.1  --   --    Liver Function Tests: No results for input(s): AST, ALT, ALKPHOS, BILITOT, PROT, ALBUMIN in the last 168 hours. No results for input(s): LIPASE,  AMYLASE in the last 168 hours. No results for input(s): AMMONIA in the last 168 hours. CBC:  Recent Labs Lab 04/01/17 0445 04/02/17 0622 04/03/17 0550 04/04/17 0853 04/05/17 0421  WBC 5.4 5.1 4.8 8.0 6.9  NEUTROABS  --   --   --  7.0 5.0  HGB 9.5* 9.8* 9.5* 9.8* 9.4*  HCT 29.0* 30.1* 28.9* 29.5* 28.6*  MCV 92.4 90.7 90.9 91.3 91.7  PLT 162 175 189 215 226   Cardiac Enzymes: No results for input(s): CKTOTAL, CKMB, CKMBINDEX, TROPONINI in the last 168 hours. BNP: BNP (last 3 results) No results for input(s): BNP in the last 8760 hours.  ProBNP (last 3 results) No results for input(s): PROBNP in the last 8760 hours.  CBG: No results for input(s): GLUCAP in the last 168 hours.     SignedFlorencia Reasons MD, PhD  [facility] 04/06/2017, 4:10 PM

## 2017-04-06 NOTE — Care Management Note (Signed)
Case Management Note  Patient Details  Name: Briana Smith MRN: [id number] Date of Birth: Jun 18, 1941  Subjective/Objective:    Septic arthritis of hip, HTN, cellulitis of breast                 Action/Plan: Discharge Planning: NCM spoke to pt and husband at bedside. Pt states she has RW at home. Husband and dtr will be at home to assist with care. Dtr will be assisting with giving IV abx at home. Spoke to [facility] RN, Pam for [employer] and will have soc this evening for [facility] RN.   PCP [name] MD  Expected Discharge Date:  04/06/17               Expected Discharge Plan:  [place]  In-House Referral:  NA  Discharge planning Services  CM Consult  Post Acute Care Choice:  Home Health Choice offered to:  Patient  DME Arranged:  N/A DME Agency:  NA  HH Arranged:  RN [place] Agency:  [place]  Status of Service:  Completed, signed off  If discussed at [place] of Stay Meetings, dates discussed:    Additional Comments:  Erenest Rasher, RN 04/06/2017, 1:18 PM

## 2017-04-07 ENCOUNTER — Encounter (HOSPITAL_COMMUNITY): Payer: Self-pay | Admitting: Orthopedic Surgery

## 2017-04-07 DIAGNOSIS — Z452 Encounter for adjustment and management of vascular access device: Secondary | ICD-10-CM | POA: Diagnosis not present

## 2017-04-07 DIAGNOSIS — I10 Essential (primary) hypertension: Secondary | ICD-10-CM | POA: Diagnosis not present

## 2017-04-07 DIAGNOSIS — M00852 Arthritis due to other bacteria, left hip: Secondary | ICD-10-CM | POA: Diagnosis not present

## 2017-04-08 LAB — CULTURE, BLOOD (ROUTINE X 2)
CULTURE: NO GROWTH
Culture: NO GROWTH
SPECIAL REQUESTS: ADEQUATE
Special Requests: ADEQUATE

## 2017-04-09 ENCOUNTER — Telehealth: Payer: Self-pay

## 2017-04-09 DIAGNOSIS — M009 Pyogenic arthritis, unspecified: Secondary | ICD-10-CM | POA: Diagnosis not present

## 2017-04-09 DIAGNOSIS — Z452 Encounter for adjustment and management of vascular access device: Secondary | ICD-10-CM | POA: Diagnosis not present

## 2017-04-09 DIAGNOSIS — I10 Essential (primary) hypertension: Secondary | ICD-10-CM | POA: Diagnosis not present

## 2017-04-09 DIAGNOSIS — M00852 Arthritis due to other bacteria, left hip: Secondary | ICD-10-CM | POA: Diagnosis not present

## 2017-04-09 LAB — AEROBIC/ANAEROBIC CULTURE (SURGICAL/DEEP WOUND): CULTURE: NO GROWTH

## 2017-04-09 LAB — AEROBIC/ANAEROBIC CULTURE W GRAM STAIN (SURGICAL/DEEP WOUND): Culture: NO GROWTH

## 2017-04-09 NOTE — Telephone Encounter (Signed)
LMTCB

## 2017-04-10 NOTE — Telephone Encounter (Signed)
D/C 04/06/17 To: home  Spoke with pt and she states that she is doing very well. She is not needing to take hydrocodone, but is taking Tylenol as needed. She denies any fever or other wound issues. She is receiving IV abx via PICC line BID with no complications. She has no questions or concerns at this time.   Appt with Dr. Yong Channel 04/15/17, pt aware.    Transition Care Management Follow-up Telephone Call  How have you been since you were released from the hospital? Good, improving   Do you understand why you were in the hospital? yes   Do you understand the discharge instrcutions? yes  Items Reviewed:  Medications reviewed: yes  Allergies reviewed: yes  Dietary changes reviewed: yes  Referrals reviewed: yes   Functional Questionnaire:   Activities of Daily Living (ADLs):   She states they are independent in the following: ambulation, bathing and hygiene, feeding, continence, grooming, toileting and dressing States they require assistance with the following: none   Any transportation issues/concerns?: no   Any patient concerns? no   Confirmed importance and date/time of follow-up visits scheduled: yes   Confirmed with patient if condition begins to worsen call PCP or go to the ER.  Patient was given the Call-a-Nurse line [phone]: yes

## 2017-04-13 DIAGNOSIS — Z5181 Encounter for therapeutic drug level monitoring: Secondary | ICD-10-CM | POA: Diagnosis not present

## 2017-04-13 DIAGNOSIS — M00852 Arthritis due to other bacteria, left hip: Secondary | ICD-10-CM | POA: Diagnosis not present

## 2017-04-13 DIAGNOSIS — I10 Essential (primary) hypertension: Secondary | ICD-10-CM | POA: Diagnosis not present

## 2017-04-13 DIAGNOSIS — M009 Pyogenic arthritis, unspecified: Secondary | ICD-10-CM | POA: Diagnosis not present

## 2017-04-13 DIAGNOSIS — Z452 Encounter for adjustment and management of vascular access device: Secondary | ICD-10-CM | POA: Diagnosis not present

## 2017-04-13 DIAGNOSIS — Z792 Long term (current) use of antibiotics: Secondary | ICD-10-CM | POA: Diagnosis not present

## 2017-04-15 ENCOUNTER — Encounter: Payer: Self-pay | Admitting: Family Medicine

## 2017-04-15 ENCOUNTER — Ambulatory Visit (INDEPENDENT_AMBULATORY_CARE_PROVIDER_SITE_OTHER): Payer: Medicare HMO | Admitting: Family Medicine

## 2017-04-15 VITALS — BP 124/90 | HR 71 | Temp 98.5°F | Ht 60.5 in | Wt 169.4 lb

## 2017-04-15 DIAGNOSIS — M00052 Staphylococcal arthritis, left hip: Secondary | ICD-10-CM | POA: Diagnosis not present

## 2017-04-15 DIAGNOSIS — I10 Essential (primary) hypertension: Secondary | ICD-10-CM

## 2017-04-15 DIAGNOSIS — D72829 Elevated white blood cell count, unspecified: Secondary | ICD-10-CM

## 2017-04-15 DIAGNOSIS — N61 Mastitis without abscess: Secondary | ICD-10-CM

## 2017-04-15 NOTE — Assessment & Plan Note (Signed)
Patient on 6 weeks of vancomycin by PICC line. She is compliant with doses. Has ortho follow up on Friday for suture removal- did not take dressing down today.

## 2017-04-15 NOTE — Assessment & Plan Note (Signed)
She had essentially been on 10-6.25mg  of lisinopril hctz before hospital and the hctz was stopped. Diastolic up slightly today with thigh measurement. We discussed making no changes at present but only consider restarting hctz portion of elevated by time of follow up for physical (hopefully within 3 months)

## 2017-04-15 NOTE — Assessment & Plan Note (Signed)
Appears fully healed on the vancomycin. She remains on this which should prevent recurrence.

## 2017-04-15 NOTE — Progress Notes (Signed)
Subjective:  Briana Smith is a 76 y.o. year old very pleasant female patient who presents for transitional care management and hospital follow up for left breast cellulitis and septic arthritis of the left hip. Patient was hospitalized from 03/28/2017 to 04/06/2017. A TCM phone call was completed on 04/10/2017 with original effort contact patient on 04/09/2017. Medical complexity moderate   Briana Smith is a 76 year old female with a history of left b reast cancer status post lumpectomy and chemoradiation who had cellulitis of the left breast about 2 years ago. On the morning of this admission she woke up with diaphoresis and breast soreness. She then noted the breast was very warm, red, tender to touch so presented to the hospital recognizing the signs of cellulitis. She states within an hour of being in the bed at the hospital she also noted some soreness in the left groin. Within a day she felt like she could not move the leg due to pain in the hip. She was started on IV vancomycin. Blood cultures were negative. MRSA screening was negative. She had a breast ultrasound which did not show any signs of abscess in the breast. Due to the pain in left hip she had an MRI of her left hip which was concerning for left hip joint effusion. There was concern for septic arthritis and on 04/02/2017 she had an arthrocentesis by interventional radiology which showed synovial fluids that were turbid and he 97% neutrophils. Her culture did not have any growth. On the 29th the proceeded with an open arthrotomy and drainage of the left hip. Initial culture grew gram-positive clusters and pairs but final culture had no growth. She was switched back to vancomycin after this. Plan was for 6 weeks of antibiotics due to septic arthritis. She has follow-up with both orthopedics and infectious disease. Infectious disease consult is later this month. She has been following with home health to assist with home dosing of vancomycin.  The first week she needed the help of her daughter but since then she has been able to do this on her own. She has follow-up on Friday with Dr. Lyla Glassing for suture removal  Patient with leukocytosis and thrombocytopenia which was thought to be reactive. The plan was to repeat CBC and BMP. With breast cancer history not planning to access left arm PICC in the right arm-we're unable to draw from that. I gave her my card with fax number and asked home health to fax [place] CBC and BMP which it appears they have been doing on a semiregular basis.  Her hypertension have been controlled previously on lisinopril 20 mg as well as hydrochlorothiazide. Her blood pressure was running on the low side in the hospital and hydrochlorothiazide was stopped. She was discharged on lisinopril 10 mg daily. Very slight elevation today is up from thigh cuff.  She also had some nausea and vomiting during the time the cellulitis and septic arthritis both treatment this is resolved. She also has constipation was resolved with stool regimen and has been doing reasonably well at home. For pain control she has been doing Tylenol 500 mg extended release and this is generally adequate enough for her.  I personally reviewed the CT of the left femur and the MRI of left hip. Also reviewed radiology findings. For the CT of the left femur done on 03/30/2017 there was no obvious bone injury of the left femur. No obvious effusion. For the MRI of the left hipthere was some signs of edema and bilateral  gluteus minimus muscles. There was also a left hip effusion noted and recommendations were for septic arthritis evaluation serum for that with arthrocentesis otherwise no acute musculoskeletal concerns.   See problem oriented charting as well ROS- no fever, chills,  Nausea, vomiting. Does have some fatigue  Past Medical History-  Patient Active Problem List   Diagnosis Date Noted  . History of breast cancer 06/30/2013    Priority: High  .  Hyperlipidemia 02/02/2017    Priority: Medium  . Hyperglycemia 03/19/2015    Priority: Medium  . Osteopenia 01/20/2014    Priority: Medium  . Essential hypertension 08/03/2008    Priority: Medium  . SKIN CANCER, HX OF 08/03/2008    Priority: Low  . Septic arthritis of hip ([place]) 04/03/2017  . Cellulitis of left breast 03/28/2017    Medications- reviewed and updated  A medical reconciliation was performed comparing current medicines to hospital discharge medications. Current Outpatient Prescriptions  Medication Sig Dispense Refill  . aspirin 81 MG tablet Take 81 mg by mouth daily.    . CYANOCOBALAMIN PO Take 1 tablet by mouth daily.    Marland Kitchen lisinopril (PRINIVIL,ZESTRIL) 10 MG tablet Take 1 tablet (10 mg total) by mouth daily. 30 tablet 0  . Multiple Vitamin (MULTIVITAMIN) tablet Take 1 tablet by mouth daily.      Marland Kitchen pyridoxine (B-6) 100 MG tablet Take 100 mg by mouth daily.    Marland Kitchen senna-docusate (SENOKOT-S) 8.6-50 MG tablet Take 1 tablet by mouth at bedtime as needed for mild constipation. 30 tablet 0  . timolol (TIMOPTIC) 0.5 % ophthalmic solution BID times 48H.    . vancomycin IVPB Inject 1,000 mg into the vein every 12 (twelve) hours. Indication: Septic arthritis of hip Last Day of Therapy:  May 14, 2017 Labs - Sunday/Monday:  CBC/D, BMP Labs - Thursday:  BMP and vancomycin trough Labs - Every week:  ESR and CRP 76 Units 0  . HYDROcodone-acetaminophen (NORCO/VICODIN) 5-325 MG tablet Take 1 tablet by mouth every 6 (six) hours as needed for moderate pain. (Patient not taking: Reported on 04/15/2017) 10 tablet 0   No current facility-administered medications for this visit.     Objective: BP 124/90 (BP Location: Right Leg, Patient Position: Sitting, Cuff Size: Large)   Pulse 71   Temp 98.5 F (36.9 C) (Oral)   Ht 5' 0.5" (1.537 m)   Wt 169 lb 6.4 oz (76.8 kg)   SpO2 97%   BMI 32.54 kg/m  Gen: NAD, resting comfortably CV: RRR no murmurs rubs or gallops Lungs: CTAB no crackles,  wheeze, rhonchi Breasts examined with husband in room- no erythema or warmth Abdomen: soft/nontender/nondistended/normal bowel sounds. No rebound or guarding.  Ext: no edema Skin: warm, dry, dressing over left thigh- did not take down Neuro: grossly normal, moves all extremities  Assessment/Plan:  Septic arthritis of hip (HCC) Patient on 6 weeks of vancomycin by PICC line. She is compliant with doses. Has ortho follow up on Friday for suture removal- did not take dressing down today.   Cellulitis of left breast Appears fully healed on the vancomycin. She remains on this which should prevent recurrence.   Essential hypertension She had essentially been on 10-6.25mg of lisinopril hctz before hospital and the hctz was stopped. Diastolic up slightly today with thigh measurement. We discussed making no changes at present but only consider restarting hctz portion of elevated by time of follow up for physical (hopefully within 3 months)  Leukocytosis- will attempt to follow by obtaining labs from  home health as drawn from PICC.   She plans to schedule CPE once picc line taken out.   Return precautions advised.  Garret Reddish, MD

## 2017-04-15 NOTE — Patient Instructions (Signed)
No changes today  Glad you are recovering so well  Have them send me a copy of labs- hopefully cbc and cmp  Once you get PICC line pulled- call over here to schedule next available physical and come fasting so we can update labs

## 2017-04-16 DIAGNOSIS — Z792 Long term (current) use of antibiotics: Secondary | ICD-10-CM | POA: Diagnosis not present

## 2017-04-16 DIAGNOSIS — I10 Essential (primary) hypertension: Secondary | ICD-10-CM | POA: Diagnosis not present

## 2017-04-16 DIAGNOSIS — M00852 Arthritis due to other bacteria, left hip: Secondary | ICD-10-CM | POA: Diagnosis not present

## 2017-04-16 DIAGNOSIS — Z452 Encounter for adjustment and management of vascular access device: Secondary | ICD-10-CM | POA: Diagnosis not present

## 2017-04-16 DIAGNOSIS — M009 Pyogenic arthritis, unspecified: Secondary | ICD-10-CM | POA: Diagnosis not present

## 2017-04-20 DIAGNOSIS — Z452 Encounter for adjustment and management of vascular access device: Secondary | ICD-10-CM | POA: Diagnosis not present

## 2017-04-20 DIAGNOSIS — M00852 Arthritis due to other bacteria, left hip: Secondary | ICD-10-CM | POA: Diagnosis not present

## 2017-04-20 DIAGNOSIS — I10 Essential (primary) hypertension: Secondary | ICD-10-CM | POA: Diagnosis not present

## 2017-04-23 DIAGNOSIS — I10 Essential (primary) hypertension: Secondary | ICD-10-CM | POA: Diagnosis not present

## 2017-04-23 DIAGNOSIS — Z452 Encounter for adjustment and management of vascular access device: Secondary | ICD-10-CM | POA: Diagnosis not present

## 2017-04-23 DIAGNOSIS — M00852 Arthritis due to other bacteria, left hip: Secondary | ICD-10-CM | POA: Diagnosis not present

## 2017-04-27 DIAGNOSIS — Z5181 Encounter for therapeutic drug level monitoring: Secondary | ICD-10-CM | POA: Diagnosis not present

## 2017-04-27 DIAGNOSIS — I10 Essential (primary) hypertension: Secondary | ICD-10-CM | POA: Diagnosis not present

## 2017-04-27 DIAGNOSIS — M00852 Arthritis due to other bacteria, left hip: Secondary | ICD-10-CM | POA: Diagnosis not present

## 2017-04-27 DIAGNOSIS — Z452 Encounter for adjustment and management of vascular access device: Secondary | ICD-10-CM | POA: Diagnosis not present

## 2017-04-29 ENCOUNTER — Other Ambulatory Visit: Payer: Self-pay | Admitting: Pharmacist

## 2017-04-30 DIAGNOSIS — Z452 Encounter for adjustment and management of vascular access device: Secondary | ICD-10-CM | POA: Diagnosis not present

## 2017-04-30 DIAGNOSIS — M00852 Arthritis due to other bacteria, left hip: Secondary | ICD-10-CM | POA: Diagnosis not present

## 2017-04-30 DIAGNOSIS — I10 Essential (primary) hypertension: Secondary | ICD-10-CM | POA: Diagnosis not present

## 2017-05-04 DIAGNOSIS — I10 Essential (primary) hypertension: Secondary | ICD-10-CM | POA: Diagnosis not present

## 2017-05-04 DIAGNOSIS — Z452 Encounter for adjustment and management of vascular access device: Secondary | ICD-10-CM | POA: Diagnosis not present

## 2017-05-04 DIAGNOSIS — M00852 Arthritis due to other bacteria, left hip: Secondary | ICD-10-CM | POA: Diagnosis not present

## 2017-05-05 ENCOUNTER — Other Ambulatory Visit: Payer: Self-pay | Admitting: Pharmacist

## 2017-05-05 ENCOUNTER — Ambulatory Visit (INDEPENDENT_AMBULATORY_CARE_PROVIDER_SITE_OTHER): Payer: Medicare HMO | Admitting: Internal Medicine

## 2017-05-05 ENCOUNTER — Encounter: Payer: Self-pay | Admitting: Internal Medicine

## 2017-05-05 DIAGNOSIS — M00852 Arthritis due to other bacteria, left hip: Secondary | ICD-10-CM | POA: Diagnosis not present

## 2017-05-05 DIAGNOSIS — N61 Mastitis without abscess: Secondary | ICD-10-CM | POA: Diagnosis not present

## 2017-05-05 NOTE — Assessment & Plan Note (Signed)
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

## 2017-05-05 NOTE — Assessment & Plan Note (Signed)
I am hopeful that her septic arthritis has now been cured with a combination of surgery and nearly 6 weeks of IV antibiotic therapy. I will stop vancomycin and have her pick removed now. She will follow-up here in 4-6 weeks.

## 2017-05-05 NOTE — Progress Notes (Signed)
[facility] for Infectious Disease  Patient Active Problem List   Diagnosis Date Noted  . Septic arthritis of hip ([place]) 04/03/2017    Priority: High  . Cellulitis of left breast 03/28/2017    Priority: High  . Hyperlipidemia 02/02/2017  . Hyperglycemia 03/19/2015  . Osteopenia 01/20/2014  . History of breast cancer 06/30/2013  . Essential hypertension 08/03/2008  . SKIN CANCER, HX OF 08/03/2008    Patient's Medications  New Prescriptions   No medications on file  Previous Medications   ASPIRIN 81 MG TABLET    Take 81 mg by mouth daily.   CYANOCOBALAMIN PO    Take 1 tablet by mouth daily.   HYDROCODONE-ACETAMINOPHEN (NORCO/VICODIN) 5-325 MG TABLET    Take 1 tablet by mouth every 6 (six) hours as needed for moderate pain.   LISINOPRIL (PRINIVIL,ZESTRIL) 10 MG TABLET    Take 1 tablet (10 mg total) by mouth daily.   MULTIPLE VITAMIN (MULTIVITAMIN) TABLET    Take 1 tablet by mouth daily.     PYRIDOXINE (B-6) 100 MG TABLET    Take 100 mg by mouth daily.   SENNA-DOCUSATE (SENOKOT-S) 8.6-50 MG TABLET    Take 1 tablet by mouth at bedtime as needed for mild constipation.   TIMOLOL (TIMOPTIC) 0.5 % OPHTHALMIC SOLUTION    BID times 48H.  Modified Medications   No medications on file  Discontinued Medications   VANCOMYCIN IVPB    Inject 1,000 mg into the vein every 12 (twelve) hours. Indication: Septic arthritis of hip Last Day of Therapy:  May 14, 2017 Labs - Sunday/Monday:  CBC/D, BMP Labs - Thursday:  BMP and vancomycin trough Labs - Every week:  ESR and CRP    Subjective: Briana Smith is in with her husband for her hospital follow-up visit. She has a history of left breast cancer surgery several years ago. She had a postoperative wound infection. She developed left breast cellulitis several years ago. Last month she had sudden onset of extreme fatigue followed by redness, pain and swelling of her left breast. She was admitted to the hospital on 03/28/2017. Shortly  after admission she noted some aching pain in her left groin. Over the next 24 hours she developed severe left hip pain. MRI revealed a left hip effusion. She was started on empiric IV antibiotics for her cellulitis. She underwent left hip surgery on 04/03/2017. Operative Gram stain showed gram-positive cocci in pairs but cultures were negative. Blood cultures were negative as well. She was discharged on IV vancomycin. She has had no problems tolerating her vancomycin or her PICC. Her cellulitis has resolved. She still has some intermittent pain in her left groin but this is much better. She is not requiring anything for pain. She is now completed 39 days of total IV antibiotics including 32 days since her surgery.  Review of Systems: Review of Systems  Constitutional: Negative for chills, diaphoresis and fever.  Gastrointestinal: Negative for abdominal pain, diarrhea, nausea and vomiting.  Musculoskeletal: Positive for joint pain.    Past Medical History:  Diagnosis Date  . Cancer [facility])    breast cancer- left  . Heart murmur    years ago. no recent issues  . Hyperlipidemia   . Hypertension     Social History  Substance Use Topics  . Smoking status: Never Smoker  . Smokeless tobacco: Never Used  . Alcohol use No    Family History  Problem Relation Age of Onset  .  COPD Father   . Diabetes Mother   . Dementia Mother   . Kidney disease Mother        not on dialysis  . Cancer Brother        jaw  . Hypertension Brother     No Known Allergies  Objective: Vitals:   05/05/17 1040  BP: (!) 168/94  Pulse: 80  Temp: 98.2 F (36.8 C)  TempSrc: Oral  Weight: 155 lb (70.3 kg)  Height: '5\' 1"'  (1.549 m)   Body mass index is 29.29 kg/m.  Physical Exam  Constitutional: She is oriented to person, place, and time.  She is in good spirits.  Cardiovascular: Normal rate and regular rhythm.   No murmur heard. Pulmonary/Chest: Effort normal and breath sounds normal.  Her left breast  cellulitis has resolved.  Abdominal: Soft. There is no tenderness.  Musculoskeletal:  She still has Steri-Strips on her anterior left hip incision. It is healing well without signs of infection.  Neurological: She is alert and oriented to person, place, and time. Gait normal.  Skin: No rash noted.  Her right arm PICC site looks good.  Psychiatric: Mood and affect normal.    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      Septic arthritis of hip ([place])    I am hopeful that her septic arthritis has now been cured with a combination of surgery and nearly 6 weeks of IV antibiotic therapy. I will stop vancomycin and have her pick removed now. She will follow-up here in 4-6 weeks.          Michel Bickers, MD [facility] for [facility] [place] [facility] [phone] pager   [phone] cell 05/05/2017, 11:13 AM

## 2017-05-06 DIAGNOSIS — I10 Essential (primary) hypertension: Secondary | ICD-10-CM | POA: Diagnosis not present

## 2017-05-06 DIAGNOSIS — Z452 Encounter for adjustment and management of vascular access device: Secondary | ICD-10-CM | POA: Diagnosis not present

## 2017-05-06 DIAGNOSIS — M00852 Arthritis due to other bacteria, left hip: Secondary | ICD-10-CM | POA: Diagnosis not present

## 2017-05-07 ENCOUNTER — Telehealth: Payer: Self-pay | Admitting: Family Medicine

## 2017-05-07 ENCOUNTER — Other Ambulatory Visit: Payer: Self-pay

## 2017-05-07 MED ORDER — LISINOPRIL 10 MG PO TABS: 10.0000 mg | ORAL_TABLET | Freq: Every day | ORAL | 1 refills | Status: DC

## 2017-05-07 NOTE — Telephone Encounter (Signed)
Prescription sent to pharmacy as requested.

## 2017-05-07 NOTE — Telephone Encounter (Signed)
Pt needs new rx lisinopril 10 mg #30 w/refills . [facility] [address]

## 2017-05-15 DIAGNOSIS — M00852 Arthritis due to other bacteria, left hip: Secondary | ICD-10-CM | POA: Diagnosis not present

## 2017-05-27 DIAGNOSIS — M009 Pyogenic arthritis, unspecified: Secondary | ICD-10-CM | POA: Diagnosis not present

## 2017-06-16 ENCOUNTER — Ambulatory Visit (INDEPENDENT_AMBULATORY_CARE_PROVIDER_SITE_OTHER): Payer: Medicare HMO | Admitting: Internal Medicine

## 2017-06-16 ENCOUNTER — Encounter: Payer: Self-pay | Admitting: Internal Medicine

## 2017-06-16 DIAGNOSIS — M00851 Arthritis due to other bacteria, right hip: Secondary | ICD-10-CM | POA: Diagnosis not present

## 2017-06-16 NOTE — Progress Notes (Signed)
[facility] for Infectious Disease  Patient Active Problem List   Diagnosis Date Noted  . Septic arthritis of hip ([place]) 04/03/2017    Priority: High  . Cellulitis of left breast 03/28/2017    Priority: High  . Hyperlipidemia 02/02/2017  . Hyperglycemia 03/19/2015  . Osteopenia 01/20/2014  . History of breast cancer 06/30/2013  . Essential hypertension 08/03/2008  . SKIN CANCER, HX OF 08/03/2008    Patient's Medications  New Prescriptions   No medications on file  Previous Medications   ASPIRIN 81 MG TABLET    Take 81 mg by mouth daily.   CYANOCOBALAMIN PO    Take 1 tablet by mouth daily.   HYDROCODONE-ACETAMINOPHEN (NORCO/VICODIN) 5-325 MG TABLET    Take 1 tablet by mouth every 6 (six) hours as needed for moderate pain.   LISINOPRIL (PRINIVIL,ZESTRIL) 10 MG TABLET    Take 1 tablet (10 mg total) by mouth daily.   MULTIPLE VITAMIN (MULTIVITAMIN) TABLET    Take 1 tablet by mouth daily.     PYRIDOXINE (B-6) 100 MG TABLET    Take 100 mg by mouth daily.   SENNA-DOCUSATE (SENOKOT-S) 8.6-50 MG TABLET    Take 1 tablet by mouth at bedtime as needed for mild constipation.   TIMOLOL (TIMOPTIC) 0.5 % OPHTHALMIC SOLUTION    BID times 48H.  Modified Medications   No medications on file  Discontinued Medications   No medications on file    Subjective: Briana Smith is in for her routine follow-up visit. She completed 39 days of IV vancomycin on 05/05/2017 for her recurrent left breast cellulitis and septic left hip. Operative Gram stain of synovial fluid showed gram-positive cocci but operative cultures were negative after having started antibiotics. She is improving and having less pain. She has been able to be more active. She has not had any fever. She still has some mild discomfort in her left groin and has difficulty putting socks on on her left foot because of decreased range of motion but otherwise is doing better.  Review of Systems: Review of Systems  Constitutional:  Negative for chills, diaphoresis and fever.  Gastrointestinal: Negative for abdominal pain, diarrhea, nausea and vomiting.  Musculoskeletal: Positive for joint pain.    Past Medical History:  Diagnosis Date  . Cancer [facility])    breast cancer- left  . Heart murmur    years ago. no recent issues  . Hyperlipidemia   . Hypertension     Social History  Substance Use Topics  . Smoking status: Never Smoker  . Smokeless tobacco: Never Used  . Alcohol use No    Family History  Problem Relation Age of Onset  . COPD Father   . Diabetes Mother   . Dementia Mother   . Kidney disease Mother        not on dialysis  . Cancer Brother        jaw  . Hypertension Brother     No Known Allergies  Objective: Vitals:   06/16/17 1052  BP: 114/66  Pulse: 75  Temp: (!) 97.1 F (36.2 C)  TempSrc: Oral  Weight: 175 lb (79.4 kg)   Body mass index is 33.07 kg/m.  Physical Exam  Constitutional: She is oriented to person, place, and time.  She is in good spirits.  Pulmonary/Chest:  Her left breast cellulitis has resolved.  Musculoskeletal: She exhibits no edema or tenderness.  She cannot cross her left leg over her right knee  or reach her foot but otherwise has good range of motion without pain.  Neurological: She is alert and oriented to person, place, and time.  Skin: No rash noted.  Psychiatric: Mood and affect normal.    Lab Results    Problem List Items Addressed This Visit      High   Septic arthritis of hip ([place])    I'm quite hopeful that her septic arthritis of her left hip has been cured. She will stay off of antibiotics, increase activity as tolerated and follow-up here in 6 weeks.          Michel Bickers, MD [facility] for Infectious [place] [facility] [phone] pager   [phone] cell 06/16/2017, 11:18 AM

## 2017-06-16 NOTE — Assessment & Plan Note (Signed)
I'm quite hopeful that her septic arthritis of her left hip has been cured. She will stay off of antibiotics, increase activity as tolerated and follow-up here in 6 weeks.

## 2017-06-26 ENCOUNTER — Encounter: Payer: Medicare HMO | Admitting: Family Medicine

## 2017-07-02 ENCOUNTER — Encounter: Payer: Self-pay | Admitting: Family Medicine

## 2017-07-02 ENCOUNTER — Ambulatory Visit (INDEPENDENT_AMBULATORY_CARE_PROVIDER_SITE_OTHER): Payer: Medicare HMO | Admitting: Family Medicine

## 2017-07-02 VITALS — BP 122/64 | HR 66 | Temp 98.2°F | Ht 60.5 in | Wt 172.2 lb

## 2017-07-02 DIAGNOSIS — M8580 Other specified disorders of bone density and structure, unspecified site: Secondary | ICD-10-CM

## 2017-07-02 DIAGNOSIS — Z23 Encounter for immunization: Secondary | ICD-10-CM | POA: Diagnosis not present

## 2017-07-02 DIAGNOSIS — R739 Hyperglycemia, unspecified: Secondary | ICD-10-CM

## 2017-07-02 DIAGNOSIS — I10 Essential (primary) hypertension: Secondary | ICD-10-CM | POA: Diagnosis not present

## 2017-07-02 DIAGNOSIS — E785 Hyperlipidemia, unspecified: Secondary | ICD-10-CM

## 2017-07-02 DIAGNOSIS — Z0000 Encounter for general adult medical examination without abnormal findings: Secondary | ICD-10-CM | POA: Diagnosis not present

## 2017-07-02 LAB — CBC
HCT: 39.2 % (ref 36.0–46.0)
HEMOGLOBIN: 12.3 g/dL (ref 12.0–15.0)
MCHC: 31.5 g/dL (ref 30.0–36.0)
MCV: 92.2 fl (ref 78.0–100.0)
Platelets: 187 10*3/uL (ref 150.0–400.0)
RBC: 4.25 Mil/uL (ref 3.87–5.11)
RDW: 14 % (ref 11.5–15.5)
WBC: 5.2 10*3/uL (ref 4.0–10.5)

## 2017-07-02 LAB — LIPID PANEL
Cholesterol: 241 mg/dL — ABNORMAL HIGH (ref 0–200)
HDL: 68.5 mg/dL (ref >39.00)
LDL Cholesterol: 155 mg/dL — ABNORMAL HIGH (ref 0–99)
NONHDL: 172.27
Total CHOL/HDL Ratio: 4
Triglycerides: 86 mg/dL (ref 0.0–149.0)
VLDL: 17.2 mg/dL (ref 0.0–40.0)

## 2017-07-02 LAB — COMPREHENSIVE METABOLIC PANEL
ALK PHOS: 56 U/L (ref 39–117)
ALT: 14 U/L (ref 0–35)
AST: 14 U/L (ref 0–37)
Albumin: 4.3 g/dL (ref 3.5–5.2)
BILIRUBIN TOTAL: 0.5 mg/dL (ref 0.2–1.2)
BUN: 21 mg/dL (ref 6–23)
CO2: 30 mEq/L (ref 19–32)
Calcium: 9.7 mg/dL (ref 8.4–10.5)
Chloride: 103 mEq/L (ref 96–112)
Creatinine, Ser: 0.84 mg/dL (ref 0.40–1.20)
GFR: 69.58 mL/min (ref >60.00)
GLUCOSE: 98 mg/dL (ref 70–99)
Potassium: 4.1 mEq/L (ref 3.5–5.1)
Sodium: 140 mEq/L (ref 135–145)
TOTAL PROTEIN: 7.2 g/dL (ref 6.0–8.3)

## 2017-07-02 LAB — HEMOGLOBIN A1C: HEMOGLOBIN A1C: 6.1 % (ref 4.6–6.5)

## 2017-07-02 NOTE — Assessment & Plan Note (Addendum)
Osteopenia- planned repeat testing next year. Did zolendronate for 18 months when on anastrazole. Vitamin D 2000 units a day. Gets some calcium through [facility]

## 2017-07-02 NOTE — Progress Notes (Addendum)
Phone: [phone]  Subjective:  Patient presents today for their annual physical. Chief complaint-noted.   See problem oriented charting- ROS- full  review of systems was completed and negative except for: occasional twinges of pain into left breast where she had the cellulitis but no redness.   The following were reviewed and entered/updated in epic: Past Medical History:  Diagnosis Date  . Cancer [facility])    breast cancer- left  . Heart murmur    years ago. no recent issues  . Hyperlipidemia   . Hypertension    Patient Active Problem List   Diagnosis Date Noted  . History of breast cancer 06/30/2013    Priority: High  . Hyperlipidemia 02/02/2017    Priority: Medium  . Hyperglycemia 03/19/2015    Priority: Medium  . Osteopenia 01/20/2014    Priority: Medium  . Essential hypertension 08/03/2008    Priority: Medium  . SKIN CANCER, HX OF 08/03/2008    Priority: Low  . Septic arthritis of hip ([place]) 04/03/2017  . Cellulitis of left breast 03/28/2017   Past Surgical History:  Procedure Laterality Date  . ABDOMINAL HYSTERECTOMY  1987   fibroids  . BREAST LUMPECTOMY  10/23/10   left  . PORT-A-CATH REMOVAL  09/02/2011   Procedure: REMOVAL PORT-A-CATH;  Surgeon: Adin Hector, MD;  Location: [place];  Service: General;  Laterality: Right;  . TOTAL HIP ARTHROPLASTY Left 04/03/2017   Procedure: IRRIGATION AND DEBRIDIMENT LEFT HIP;  Surgeon: Rod Can, MD;  Location: [facility];  Service: Orthopedics;  Laterality: Left;    Family History  Problem Relation Age of Onset  . COPD Father   . Diabetes Mother   . Dementia Mother   . Kidney disease Mother        not on dialysis  . Cancer Brother        jaw  . Hypertension Brother     Medications- reviewed and updated Current Outpatient Prescriptions  Medication Sig Dispense Refill  . aspirin 81 MG tablet Take 81 mg by mouth daily.    . cholecalciferol (VITAMIN D) 1000 units tablet Take 2,000 Units by  mouth daily.    . CYANOCOBALAMIN PO Take 1 tablet by mouth daily.    Marland Kitchen lisinopril (PRINIVIL,ZESTRIL) 10 MG tablet Take 1 tablet (10 mg total) by mouth daily. 90 tablet 1  . Multiple Vitamin (MULTIVITAMIN) tablet Take 1 tablet by mouth daily.      Marland Kitchen pyridoxine (B-6) 100 MG tablet Take 100 mg by mouth daily.    Marland Kitchen senna-docusate (SENOKOT-S) 8.6-50 MG tablet Take 1 tablet by mouth at bedtime as needed for mild constipation. 30 tablet 0  . timolol (TIMOPTIC) 0.5 % ophthalmic solution BID times 48H.     No current facility-administered medications for this visit.     Allergies-reviewed and updated No Known Allergies  Social History   Social History  . Marital status: Married    Spouse name: N/A  . Number of children: N/A  . Years of education: N/A   Social History Main Topics  . Smoking status: Never Smoker  . Smokeless tobacco: Never Used  . Alcohol use No  . Drug use: No  . Sexual activity: Not Asked   Other Topics Concern  . None   Social History Narrative   Married. Lives with husband. 3 children. 3 grandkids (22, 22, 18 in 2018).       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       Hobbies: working  puzzles, reading, shoping, beach    Objective: BP 122/64 (BP Location: Right Arm, Patient Position: Sitting, Cuff Size: Large)   Pulse 66   Temp 98.2 F (36.8 C) (Oral)   Ht 5' 0.5" (1.537 m)   Wt 172 lb 3.2 oz (78.1 kg)   SpO2 97%   BMI 33.08 kg/m  Gen: NAD, resting comfortably HEENT: Mucous membranes are moist. Oropharynx normal Neck: no thyromegaly CV: RRR no murmurs rubs or gallops Lungs: CTAB no crackles, wheeze, rhonchi Abdomen: soft/nontender/nondistended/normal bowel sounds. No rebound or guarding.  Ext: no edema Skin: warm, dry Neuro: grossly normal, moves all extremities, PERRLA Breasts: normal appearance, no masses or tenderness . Very small scar on bottom of left breast.    Assessment/Plan:  76 y.o. female presenting for annual physical.    Health Maintenance counseling: 1. Anticipatory guidance: Patient counseled regarding regular dental exams q6 months, eye exams -yearly, wearing seatbelts.  2. Risk factor reduction:  Advised patient of need for regular exercise and diet rich and fruits and vegetables to reduce risk of heart attack and stroke. Exercise- riding bicycle about 3x a week for left leg in particular. Diet-states weight up from 155( down when sick then got home and lots of good food brought to hre- working to reduce the weight gain).  Wt Readings from Last 3 Encounters:  07/02/17 172 lb 3.2 oz (78.1 kg)  06/16/17 175 lb (79.4 kg)  05/05/17 155 lb (70.3 kg)   3. Immunizations/screenings/ancillary studies- flu shot given today. Will discuss shingrix when availability improved Immunization History  Administered Date(s) Administered  . Influenza Split 07/10/2011, 07/29/2012, 08/04/2013  . Influenza Whole 07/18/2009  . Influenza-Unspecified 07/31/2014  . Pneumococcal Conjugate-13 11/15/2013  . Pneumococcal Polysaccharide-23 11/18/2005  . Td 08/02/2002  . Tdap 10/20/2012  . Zoster 02/21/2009  4. Cervical cancer screening- never had abnormal pap smear, passed age based screening.  5. Breast cancer history- finished anastrozole last year after 5 years. Breast exam today. Mammogram 10/08/16.  6. Colon cancer screening -  Per record - repeat 2019 planned. Has hyperplastic polyp 2009 7. Skin cancer screening- follows with Dr. Nevada Crane.  advised regular sunscreen use. Denies worrisome, changing, or new skin lesions.    Status of chronic or acute concerns   Septic arthritis- following with ID. Now off antibiotics as of 06/16/17 ID visit. Also was dealing with recurrent left breast cellulitis  Osteopenia Osteopenia- planned repeat testing next year. Did zolendronate for 18 months when on anastrazole. Vitamin D 2000 units a day. Gets some calcium through [facility]  Hyperlipidemia HLD- had not been on rx under Dr. Sherren Mocha, update ascvd  risk  Essential hypertension HTN- controlled with lisinopril 1mg  BP Readings from Last 3 Encounters:  07/02/17 122/64  06/16/17 114/66  05/05/17 (!) 168/94     Hyperglycemia Hyperglycemia- update a1c   She states if no issues - she was told she may cancel Future Appointments Date Time Provider [place]  07/28/2017 9:30 AM Michel Bickers, MD [facility]   6 month follow up  Orders Placed This Encounter  Procedures  . CBC    [place]  . Comprehensive metabolic panel    [place]    Order Specific Question:   Has the patient fasted?    Answer:   No  . Lipid panel    [place]    Order Specific Question:   Has the patient fasted?    Answer:   No  . Hemoglobin A1c    [place]    Meds ordered  this encounter  Medications  . cholecalciferol (VITAMIN D) 1000 units tablet    Sig: Take 2,000 Units by mouth daily.    Return precautions advised.  Garret Reddish, MD

## 2017-07-02 NOTE — Assessment & Plan Note (Signed)
Hyperglycemia- update a1c

## 2017-07-02 NOTE — Assessment & Plan Note (Signed)
HLD- had not been on rx under Dr. Sherren Mocha, update ascvd risk

## 2017-07-02 NOTE — Assessment & Plan Note (Signed)
HTN- controlled with lisinopril 1mg  BP Readings from Last 3 Encounters:  07/02/17 122/64  06/16/17 114/66  05/05/17 (!) 168/94

## 2017-07-02 NOTE — Patient Instructions (Signed)
Thanks for getting the flu shot  Please stop by lab before you go  Glad you are doing so well all things considered!   Would advise working on getting weight back down slowly- to help reduce risk of diabetes. Regular exercise/heathy eating can help.

## 2017-07-13 DIAGNOSIS — R69 Illness, unspecified: Secondary | ICD-10-CM | POA: Diagnosis not present

## 2017-07-23 DIAGNOSIS — R69 Illness, unspecified: Secondary | ICD-10-CM | POA: Diagnosis not present

## 2017-07-24 ENCOUNTER — Telehealth: Payer: Self-pay

## 2017-07-24 NOTE — Telephone Encounter (Signed)
Patient is on the list for [employer] 2018 and may be a good candidate for an AWV. Please let me know if/when appt is scheduled.   

## 2017-07-28 ENCOUNTER — Ambulatory Visit: Payer: Medicare HMO | Admitting: Internal Medicine

## 2017-08-13 ENCOUNTER — Other Ambulatory Visit: Payer: Self-pay | Admitting: Nurse Practitioner

## 2017-09-08 ENCOUNTER — Other Ambulatory Visit: Payer: Self-pay | Admitting: Oncology

## 2017-09-08 DIAGNOSIS — Z9889 Other specified postprocedural states: Secondary | ICD-10-CM

## 2017-10-22 ENCOUNTER — Ambulatory Visit
Admission: RE | Admit: 2017-10-22 | Discharge: 2017-10-22 | Disposition: A | Discharge status: Home / Self Care | Payer: Medicare HMO | Source: Ambulatory Visit | Attending: Oncology | Admitting: Oncology

## 2017-10-22 DIAGNOSIS — Z9889 Other specified postprocedural states: Secondary | ICD-10-CM

## 2017-10-22 DIAGNOSIS — R928 Other abnormal and inconclusive findings on diagnostic imaging of breast: Secondary | ICD-10-CM | POA: Diagnosis not present

## 2017-10-22 HISTORY — DX: Personal history of antineoplastic chemotherapy: Z92.21

## 2017-10-22 HISTORY — DX: Personal history of irradiation: Z92.3

## 2017-11-03 ENCOUNTER — Other Ambulatory Visit: Payer: Self-pay | Admitting: Family Medicine

## 2017-11-16 DIAGNOSIS — H53022 Refractive amblyopia, left eye: Secondary | ICD-10-CM | POA: Diagnosis not present

## 2017-11-16 DIAGNOSIS — Z961 Presence of intraocular lens: Secondary | ICD-10-CM | POA: Diagnosis not present

## 2017-11-16 DIAGNOSIS — H40053 Ocular hypertension, bilateral: Secondary | ICD-10-CM | POA: Diagnosis not present

## 2017-11-16 DIAGNOSIS — H02839 Dermatochalasis of unspecified eye, unspecified eyelid: Secondary | ICD-10-CM | POA: Diagnosis not present

## 2018-01-12 DIAGNOSIS — R69 Illness, unspecified: Secondary | ICD-10-CM | POA: Diagnosis not present

## 2018-04-18 DIAGNOSIS — H6123 Impacted cerumen, bilateral: Secondary | ICD-10-CM | POA: Diagnosis not present

## 2018-05-10 ENCOUNTER — Other Ambulatory Visit: Payer: Self-pay | Admitting: Family Medicine

## 2018-07-26 ENCOUNTER — Encounter: Payer: Self-pay | Admitting: Family Medicine

## 2018-07-26 ENCOUNTER — Ambulatory Visit (INDEPENDENT_AMBULATORY_CARE_PROVIDER_SITE_OTHER): Payer: Medicare HMO | Admitting: Family Medicine

## 2018-07-26 VITALS — BP 138/68 | HR 61 | Temp 98.3°F | Ht 60.5 in | Wt 164.8 lb

## 2018-07-26 DIAGNOSIS — R739 Hyperglycemia, unspecified: Secondary | ICD-10-CM | POA: Diagnosis not present

## 2018-07-26 DIAGNOSIS — M85851 Other specified disorders of bone density and structure, right thigh: Secondary | ICD-10-CM

## 2018-07-26 DIAGNOSIS — Z23 Encounter for immunization: Secondary | ICD-10-CM | POA: Diagnosis not present

## 2018-07-26 DIAGNOSIS — Z1211 Encounter for screening for malignant neoplasm of colon: Secondary | ICD-10-CM | POA: Diagnosis not present

## 2018-07-26 DIAGNOSIS — Z0000 Encounter for general adult medical examination without abnormal findings: Secondary | ICD-10-CM

## 2018-07-26 DIAGNOSIS — R3915 Urgency of urination: Secondary | ICD-10-CM | POA: Diagnosis not present

## 2018-07-26 DIAGNOSIS — E785 Hyperlipidemia, unspecified: Secondary | ICD-10-CM

## 2018-07-26 DIAGNOSIS — Z8739 Personal history of other diseases of the musculoskeletal system and connective tissue: Secondary | ICD-10-CM

## 2018-07-26 DIAGNOSIS — I10 Essential (primary) hypertension: Secondary | ICD-10-CM | POA: Diagnosis not present

## 2018-07-26 LAB — POC URINALSYSI DIPSTICK (AUTOMATED)
BILIRUBIN UA: NEGATIVE
GLUCOSE UA: NEGATIVE
Leukocytes, UA: NEGATIVE
Nitrite, UA: NEGATIVE
PH UA: 6 (ref 5.0–8.0)
Protein, UA: NEGATIVE
RBC UA: NEGATIVE
SPEC GRAV UA: 1.025 (ref 1.010–1.025)
Urobilinogen, UA: 0.2 E.U./dL

## 2018-07-26 LAB — COMPREHENSIVE METABOLIC PANEL
ALK PHOS: 46 U/L (ref 39–117)
ALT: 16 U/L (ref 0–35)
AST: 15 U/L (ref 0–37)
Albumin: 4.4 g/dL (ref 3.5–5.2)
BILIRUBIN TOTAL: 0.4 mg/dL (ref 0.2–1.2)
BUN: 19 mg/dL (ref 6–23)
CO2: 29 meq/L (ref 19–32)
Calcium: 9.5 mg/dL (ref 8.4–10.5)
Chloride: 103 mEq/L (ref 96–112)
Creatinine, Ser: 0.83 mg/dL (ref 0.40–1.20)
GFR: 70.36 mL/min (ref >60.00)
GLUCOSE: 105 mg/dL — AB (ref 70–99)
Potassium: 4.4 mEq/L (ref 3.5–5.1)
SODIUM: 138 meq/L (ref 135–145)
TOTAL PROTEIN: 7.4 g/dL (ref 6.0–8.3)

## 2018-07-26 LAB — CBC
HCT: 39.1 % (ref 36.0–46.0)
HEMOGLOBIN: 13.3 g/dL (ref 12.0–15.0)
MCHC: 33.9 g/dL (ref 30.0–36.0)
MCV: 91.9 fl (ref 78.0–100.0)
Platelets: 160 10*3/uL (ref 150.0–400.0)
RBC: 4.26 Mil/uL (ref 3.87–5.11)
RDW: 13.1 % (ref 11.5–15.5)
WBC: 6 10*3/uL (ref 4.0–10.5)

## 2018-07-26 LAB — LIPID PANEL
CHOL/HDL RATIO: 4
Cholesterol: 235 mg/dL — ABNORMAL HIGH (ref 0–200)
HDL: 64.1 mg/dL (ref >39.00)
LDL Cholesterol: 155 mg/dL — ABNORMAL HIGH (ref 0–99)
NONHDL: 170.77
Triglycerides: 79 mg/dL (ref 0.0–149.0)
VLDL: 15.8 mg/dL (ref 0.0–40.0)

## 2018-07-26 LAB — HEMOGLOBIN A1C: HEMOGLOBIN A1C: 6.1 % (ref 4.6–6.5)

## 2018-07-26 NOTE — Patient Instructions (Addendum)
Health Maintenance Due  Topic Date Due  . INFLUENZA VACCINE -thanks for getting this today! 05/06/2018   Please check with your pharmacy to see if they have the shingrix vaccine. If they do- please get this immunization and update [place] by phone call or mychart with dates you receive the vaccine. If you cant get the first shot today- would wait about a month.   We will call you within two weeks about your referral to GI. If you do not hear within 2 weeks, give [place] a call.   Schedule your bone density test at check out desk. You may also call directly to X-ray at [phone] to schedule an appointment that is convenient for you.  - located [address] across the street from [place] - in the basement - you do need an appointment for the bone density tests.

## 2018-07-26 NOTE — Assessment & Plan Note (Signed)
controlled with lisinopril 10mg  though high normal

## 2018-07-26 NOTE — Progress Notes (Signed)
Phone: [phone]  Subjective:  Patient presents today for their annual physical. Chief complaint-noted.   See problem oriented charting- ROS- full  review of systems was completed and negative except for: hearing loss, runny nose, tinnitus, urinary urgency, bruises easily  The following were reviewed and entered/updated in epic: Past Medical History:  Diagnosis Date  . Cancer [facility])    breast cancer- left  . Heart murmur    years ago. no recent issues  . Hyperlipidemia   . Hypertension   . Personal history of chemotherapy   . Personal history of radiation therapy    Patient Active Problem List   Diagnosis Date Noted  . History of septic arthritis 07/26/2018    Priority: High  . History of breast cancer 06/30/2013    Priority: High  . Hyperlipidemia 02/02/2017    Priority: Medium  . Hyperglycemia 03/19/2015    Priority: Medium  . Osteopenia 01/20/2014    Priority: Medium  . Essential hypertension 08/03/2008    Priority: Medium  . SKIN CANCER, HX OF 08/03/2008    Priority: Low  . Cellulitis of left breast 03/28/2017   Past Surgical History:  Procedure Laterality Date  . ABDOMINAL HYSTERECTOMY  1987   fibroids  . BREAST LUMPECTOMY  10/23/10   left  . PORT-A-CATH REMOVAL  09/02/2011   Procedure: REMOVAL PORT-A-CATH;  Surgeon: Adin Hector, MD;  Location: [place];  Service: General;  Laterality: Right;  . TOTAL HIP ARTHROPLASTY Left 04/03/2017   Procedure: IRRIGATION AND DEBRIDIMENT LEFT HIP;  Surgeon: Rod Can, MD;  [facility];  Service: Orthopedics;  Laterality: Left;    Family History  Problem Relation Age of Onset  . COPD Father   . Diabetes Mother   . Dementia Mother   . Kidney disease Mother        not on dialysis  . Cancer Brother        jaw  . Hypertension Brother     Medications- reviewed and updated Current Outpatient Medications  Medication Sig Dispense Refill  . aspirin 81 MG tablet Take 81 mg by mouth daily.     . cholecalciferol (VITAMIN D) 1000 units tablet Take 2,000 Units by mouth daily.    . CYANOCOBALAMIN PO Take 1 tablet by mouth daily.    Marland Kitchen lisinopril (PRINIVIL,ZESTRIL) 10 MG tablet TAKE 1 TABLET BY MOUTH ONCE DAILY 90 tablet 0  . Multiple Vitamin (MULTIVITAMIN) tablet Take 1 tablet by mouth daily.      Marland Kitchen pyridoxine (B-6) 100 MG tablet Take 100 mg by mouth daily.    Marland Kitchen senna-docusate (SENOKOT-S) 8.6-50 MG tablet Take 1 tablet by mouth at bedtime as needed for mild constipation. 30 tablet 0   No current facility-administered medications for this visit.     Allergies-reviewed and updated No Known Allergies  Social History   Social History Narrative   Married. Lives with husband. 3 children. 3 grandkids (22, 22, 18 in 2018).       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       Hobbies: working puzzles, reading, shoping, beach    Objective: BP 138/68 (BP Location: Right Arm, Patient Position: Sitting, Cuff Size: Large)   Pulse 61   Temp 98.3 F (36.8 C) (Oral)   Ht 5' 0.5" (1.537 m)   Wt 164 lb 12.8 oz (74.8 kg)   SpO2 97%   BMI 31.66 kg/m  Gen: NAD, resting comfortably HEENT: Mucous membranes are moist. Oropharynx normal Neck: no thyromegaly  CV: RRR no murmurs rubs or gallops Lungs: CTAB no crackles, wheeze, rhonchi Abdomen: soft/nontender/nondistended/normal bowel sounds. No rebound or guarding.  Ext: no edema Skin: warm, dry Neuro: grossly normal, moves all extremities, PERRLA  Assessment/Plan:  77 y.o. female presenting for annual physical.  Health Maintenance counseling: 1. Anticipatory guidance: Patient counseled regarding regular dental exams -q6 months, eye exams - yearly, wearing seatbelts.  2. Risk factor reduction:  Advised patient of need for regular exercise and diet rich and fruits and vegetables to reduce risk of heart attack and stroke. Exercise- someday's 5 days a week for 30 minutes- other weeks may not do it at all- tries to be consistent.  Diet-has continued weight watchers- really stuck to the diet. Wants to lose another 10 lbs in next year. .  Wt Readings from Last 3 Encounters:  07/26/18 164 lb 12.8 oz (74.8 kg)  07/02/17 172 lb 3.2 oz (78.1 kg)  06/16/17 175 lb (79.4 kg)  3. Immunizations/screenings/ancillary studies-flu shot today. Discussed shingrix at pharmacy  Immunization History  Administered Date(s) Administered  . Influenza Split 07/10/2011, 07/29/2012, 08/04/2013  . Influenza Whole 07/18/2009  . Influenza, High Dose Seasonal PF 08/15/2015, 07/02/2017, 07/26/2018  . Influenza-Unspecified 07/31/2014  . Pneumococcal Conjugate-13 11/15/2013  . Pneumococcal Polysaccharide-23 11/18/2005  . Td 08/02/2002  . Tdap 10/20/2012  . Zoster 02/21/2009  4. Cervical cancer screening-no history of abnormal Pap smear, she is past age based screening recommendations 5. Breast cancer screening-  breast exam today reassuring-  due to history of left breast cancer s/p radiation and lumpectomy-finished anastrozole 2 years ago-and mammogram 10/22/2017 6. Colon cancer screening - patient with hyperplastic polyp in 2009-records had indicated 10-year follow-up. Will refer back for either final colonoscopy or deferral of colonoscopy due to age  79. Skin cancer screening-follows with Dr. [place] Crane as needed. advised regular sunscreen use. Denies worrisome, changing, or new skin lesions.  8. Osteoporosis screening at 52- see below   Status of chronic or acute concerns   Gets some soreness in upper jaw for several months- had x-rays which were reassuring with dentist. Then they are considering referring her to dental surgeon to evaluate further- she sees them tomorrow. Happy to see her back and possibly consider ENT referral if they do not uncover cause but since they have started workup she will continue with them for now.   History is septic arthritis- followed with ID. Also dealt with recurrent left breast cellulitis. No recent issues- no longer  seeing ID  Urinary urgency since getting off the vancomycin. Will check UA  hyperglycemia- update a1c, hoping improved with weight loss Lab Results  Component Value Date   HGBA1C 6.1 07/02/2017   HGBA1C 5.9 (H) 03/28/2017   HGBA1C 6.2 (H) 03/20/2015   Return in about 1 year (around 07/27/2019) for physical.  Lab/Order associations: NOT fasting Preventative health care  Hyperlipidemia, unspecified hyperlipidemia type - Plan: CBC, Comprehensive metabolic panel, Lipid panel, TSH  Hyperglycemia - Plan: Hemoglobin A1c  Screen for colon cancer - Plan: Ambulatory referral to Gastroenterology  Urinary urgency - Plan: POCT Urinalysis Dipstick (Automated)  Osteopenia of neck of right femur - Plan: DG Bone Density  Need for prophylactic vaccination and inoculation against influenza - Plan: Flu vaccine HIGH DOSE PF  History of septic arthritis  Essential hypertension  Return precautions advised.  Garret Reddish, MD

## 2018-07-26 NOTE — Assessment & Plan Note (Signed)
HLD- had not been on rx under Dr. Sherren Mocha- Spoke with pharmacist at [facility] who recommended crestor/rosuvastatin twice a week if needed. We discussed possibly even just once a week.

## 2018-07-26 NOTE — Assessment & Plan Note (Signed)
Osteopenia- planned repeat testing-ordered today. Did zolendronate for 18 months when on anastrazole. Vitamin D 2000 units a day. Gets some calcium through [facility]

## 2018-07-26 NOTE — Addendum Note (Signed)
Addended by: Frutoso Chase A on: 07/26/2018 02:16 PM   Modules accepted: Orders

## 2018-07-27 ENCOUNTER — Other Ambulatory Visit: Payer: Self-pay

## 2018-07-27 MED ORDER — ROSUVASTATIN CALCIUM 20 MG PO TABS: 20.0000 mg | ORAL_TABLET | ORAL | 11 refills | Status: DC

## 2018-08-03 ENCOUNTER — Ambulatory Visit (INDEPENDENT_AMBULATORY_CARE_PROVIDER_SITE_OTHER)
Admission: RE | Admit: 2018-08-03 | Discharge: 2018-08-03 | Disposition: A | Discharge status: Home / Self Care | Payer: Medicare HMO | Source: Ambulatory Visit | Attending: Family Medicine | Admitting: Family Medicine

## 2018-08-03 DIAGNOSIS — M85851 Other specified disorders of bone density and structure, right thigh: Secondary | ICD-10-CM

## 2018-08-26 ENCOUNTER — Other Ambulatory Visit: Payer: Self-pay | Admitting: Family Medicine

## 2018-08-30 ENCOUNTER — Encounter: Payer: Self-pay | Admitting: Family Medicine

## 2018-08-30 ENCOUNTER — Ambulatory Visit (INDEPENDENT_AMBULATORY_CARE_PROVIDER_SITE_OTHER): Payer: Medicare HMO | Admitting: Family Medicine

## 2018-08-30 VITALS — BP 130/58 | HR 65 | Temp 98.4°F | Ht 60.5 in | Wt 169.0 lb

## 2018-08-30 DIAGNOSIS — E785 Hyperlipidemia, unspecified: Secondary | ICD-10-CM

## 2018-08-30 DIAGNOSIS — M810 Age-related osteoporosis without current pathological fracture: Secondary | ICD-10-CM | POA: Diagnosis not present

## 2018-08-30 DIAGNOSIS — I10 Essential (primary) hypertension: Secondary | ICD-10-CM

## 2018-08-30 MED ORDER — ALENDRONATE SODIUM 70 MG PO TABS: 70.0000 mg | ORAL_TABLET | ORAL | 11 refills | Status: DC

## 2018-08-30 NOTE — Assessment & Plan Note (Signed)
S: Hopefully improved controlled on Crestor 20 mg twice a week Lab Results  Component Value Date   CHOL 235 (H) 07/26/2018   HDL 64.10 07/26/2018   LDLCALC 155 (H) 07/26/2018   LDLDIRECT 141.7 11/08/2013   TRIG 79.0 07/26/2018   CHOLHDL 4 07/26/2018   A/P: We agreed to check a direct LDL at next visit.  Patient would also like to check LFTs

## 2018-08-30 NOTE — Progress Notes (Signed)
Subjective:  Briana Smith is a 77 y.o. year old very pleasant female patient who presents for/with See problem oriented charting ROS-occasional very mild heartburn, otherwise no chest pain.  No reported shortness of breath.  Had some thinning nails now better on biotin.  Past Medical History-  Patient Active Problem List   Diagnosis Date Noted  . History of septic arthritis 07/26/2018    Priority: High  . Osteoporosis 01/20/2014    Priority: High  . History of breast cancer 06/30/2013    Priority: High  . Hyperlipidemia 02/02/2017    Priority: Medium  . Hyperglycemia 03/19/2015    Priority: Medium  . Essential hypertension 08/03/2008    Priority: Medium  . SKIN CANCER, HX OF 08/03/2008    Priority: Low  . Cellulitis of left breast 03/28/2017    Medications- reviewed and updated Current Outpatient Medications  Medication Sig Dispense Refill  . cholecalciferol (VITAMIN D) 1000 units tablet Take 2,000 Units by mouth daily.    . CYANOCOBALAMIN PO Take 1 tablet by mouth daily.    Marland Kitchen lisinopril (PRINIVIL,ZESTRIL) 10 MG tablet TAKE 1 TABLET BY MOUTH ONCE DAILY 90 tablet 1  . Multiple Vitamin (MULTIVITAMIN) tablet Take 1 tablet by mouth daily.      Marland Kitchen pyridoxine (B-6) 100 MG tablet Take 100 mg by mouth daily.    . rosuvastatin (CRESTOR) 20 MG tablet Take 1 tablet (20 mg total) by mouth 2 (two) times a week. 9 tablet 11  . senna-docusate (SENOKOT-S) 8.6-50 MG tablet Take 1 tablet by mouth at bedtime as needed for mild constipation. 30 tablet 0   Objective: BP (!) 130/58 (BP Location: Left Arm, Patient Position: Sitting, Cuff Size: Large)   Pulse 65   Temp 98.4 F (36.9 C) (Oral)   Ht 5' 0.5" (1.537 m)   Wt 169 lb (76.7 kg)   SpO2 96%   BMI 32.46 kg/m  Gen: NAD, resting comfortably CV: RRR no murmurs rubs or gallops Lungs: CTAB no crackles, wheeze, rhonchi Abdomen: soft/nontender/nondistended/normal bowel sounds  Ext: no edema Skin: warm, dry, nails largely normal Neuro:  Speech normal, moves all extremities  Assessment/Plan:  Hyperlipidemia S: Hopefully improved controlled on Crestor 20 mg twice a week Lab Results  Component Value Date   CHOL 235 (H) 07/26/2018   HDL 64.10 07/26/2018   LDLCALC 155 (H) 07/26/2018   LDLDIRECT 141.7 11/08/2013   TRIG 79.0 07/26/2018   CHOLHDL 4 07/26/2018   A/P: We agreed to check a direct LDL at next visit.  Patient would also like to check LFTs  Osteoporosis S: Patient with new diagnosis of osteoporosis based off of bone density from 08/03/2018.  Her lumbar spine T score was -2.7 which was down 20% from last check.  She also had T score in right femoral neck of -2.0 and in left femoral neck of -1.9-these were down 5% from last check.  Already doing treadmill over last few years for most part as well as taking vitamin D.   Patient was on Reclast for the 18 months she was on anastrozole- x 3 doses. Has been off now for at least 1.5 years.   Does occasionally get some reflux- heartburn at night and rolaids helps.  A/P: Osteoporosis with poor control.  She will take calcium and vitamin D as per after visit summary.  We discussed trial of Fosamax.  We reviewed potential side effects.  She is willing after discussion to move forward.  We discussed plan for at least 5-year trial  with repeat bone density every 2 to 3 years.  Essential hypertension S: controlled on lisinopril 10 mg BP Readings from Last 3 Encounters:  08/30/18 (!) 130/58  07/26/18 138/68  07/02/17 122/64  A/P: Stable-continue current medications    Future Appointments  Date Time Provider [place]  12/29/2018  9:20 AM [name], MD [facility]  07/28/2019  8:20 AM Yong Channel [name], MD [facility]    Meds ordered this encounter  Medications  . alendronate (FOSAMAX) 70 MG tablet    Sig: Take 1 tablet (70 mg total) by mouth once a week. Take with a full glass of water on an empty stomach.    Dispense:  5 tablet    Refill:  11    Return precautions advised.  Garret Reddish, MD

## 2018-08-30 NOTE — Patient Instructions (Addendum)
You have osteoporosis on most recent evaluation.   At a minimum, recommend [phone] IU of vitamin D and 1200mg  of Calcium per day. You can get this with a calcium-vitamin D supplement or try to get some from your diet and some from a supplement (team please give her a handout)   Once you have the above in place, I would start taking fosamax 70mg  once a week.  Administer first thing in the morning and >30 minutes before the first food, beverage (except plain water), or other medication of the day. Do not take with mineral water or with other beverages. Stay upright (not to lie down) for at least 30 minutes after taking medicine and until after first food of the day (to reduce irritation). Must be taken with 6 to 8 oz of plain water. The tablet should be swallowed whole; do not chew or suck.  If this fosamax is not on your formulary- we can look at other options.   Check in 3-6 months from now to revaluate cholesterol and see how you are doing with fosamax or other similar medicine if we have to chage  Call [place] for them to evaluate whether you need a final colonoscopy [phone]  Please stop by lab before you go- for thyroid test ordered in october

## 2018-08-30 NOTE — Assessment & Plan Note (Signed)
S: Patient with new diagnosis of osteoporosis based off of bone density from 08/03/2018.  Her lumbar spine T score was -2.7 which was down 20% from last check.  She also had T score in right femoral neck of -2.0 and in left femoral neck of -1.9-these were down 5% from last check.  Already doing treadmill over last few years for most part as well as taking vitamin D.   Patient was on Reclast for the 18 months she was on anastrozole- x 3 doses. Has been off now for at least 1.5 years.   Does occasionally get some reflux- heartburn at night and rolaids helps.  A/P: Osteoporosis with poor control.  She will take calcium and vitamin D as per after visit summary.  We discussed trial of Fosamax.  We reviewed potential side effects.  She is willing after discussion to move forward.  We discussed plan for at least 5-year trial with repeat bone density every 2 to 3 years.

## 2018-08-30 NOTE — Assessment & Plan Note (Signed)
S: controlled on lisinopril 10 mg BP Readings from Last 3 Encounters:  08/30/18 (!) 130/58  07/26/18 138/68  07/02/17 122/64  A/P: Stable-continue current medications

## 2018-09-09 ENCOUNTER — Encounter: Payer: Self-pay | Admitting: Family Medicine

## 2018-09-20 ENCOUNTER — Other Ambulatory Visit: Payer: Self-pay | Admitting: Oncology

## 2018-09-20 DIAGNOSIS — Z1231 Encounter for screening mammogram for malignant neoplasm of breast: Secondary | ICD-10-CM

## 2018-09-25 ENCOUNTER — Encounter: Payer: Self-pay | Admitting: Gastroenterology

## 2018-10-28 ENCOUNTER — Ambulatory Visit
Admission: RE | Admit: 2018-10-28 | Discharge: 2018-10-28 | Disposition: A | Discharge status: Home / Self Care | Payer: Medicare HMO | Source: Ambulatory Visit | Attending: Oncology | Admitting: Oncology

## 2018-10-28 DIAGNOSIS — Z1231 Encounter for screening mammogram for malignant neoplasm of breast: Secondary | ICD-10-CM

## 2018-10-28 HISTORY — DX: Malignant neoplasm of unspecified site of unspecified female breast: C50.919

## 2018-12-16 ENCOUNTER — Other Ambulatory Visit: Payer: Self-pay

## 2018-12-16 ENCOUNTER — Ambulatory Visit (INDEPENDENT_AMBULATORY_CARE_PROVIDER_SITE_OTHER): Payer: Medicare HMO | Admitting: Family Medicine

## 2018-12-16 ENCOUNTER — Encounter: Payer: Self-pay | Admitting: Family Medicine

## 2018-12-16 VITALS — BP 124/68 | HR 71 | Temp 98.5°F | Ht 60.5 in | Wt 170.5 lb

## 2018-12-16 DIAGNOSIS — J209 Acute bronchitis, unspecified: Secondary | ICD-10-CM | POA: Diagnosis not present

## 2018-12-16 MED ORDER — PREDNISONE 20 MG PO TABS: ORAL_TABLET | ORAL | 0 refills | Status: DC

## 2018-12-16 MED ORDER — GUAIFENESIN-CODEINE 100-10 MG/5ML PO SOLN
5.0000 mL | Freq: Four times a day (QID) | ORAL | 0 refills | Status: DC | PRN
Start: 2018-12-16 — End: 2018-12-22

## 2018-12-16 NOTE — Patient Instructions (Addendum)
  Bronchitis History and exam today are suggestive of viral infection most likely due to bronchitis. Symptomatic treatment with: Prednisone and codeine cough syrup Discussed prednisone: We opted and given her wheeze  We discussed that we did not find any infection that had higher probability of being bacterial such as pneumonia, strep throat, ear infection, bacterial sinusitis. We discussed signs that bacterial infection may have developed particularly fever or shortness of breath and reasons for follow up (symptoms worsen, last past expected time frame, new concerns arise).  If she has worsening symptoms-I recommended she proceed immediately to the emergency room.  Likely course of 3-6 weeks. Patient is contagious and advised good handwashing and consideration of mask If going to be in public places.

## 2018-12-16 NOTE — Progress Notes (Signed)
PCP: [name], MD  Subjective:  Briana Smith is a 78 y.o. year old very pleasant female patient who presents with  symptoms including nasal congestion, dry cough, chest congestion.  Cough is been particularly difficult since Tuesday - does  have wheeze as well -started: Saturday when she was in [facility] (they had homeowners meeting with people from all over country that weekend but she was already feeling ill before that time- no known covid-19 contacts), symptoms are stable.  She is unable to sleep due to the cough. -previous treatments: Whiskey/20 minutes.  NyQuil was not helpful.  Honey and lemon was helpful. She states she has had prednisone in the past and has done well with it. -sick contacts/travel/risks: denies flu exposure.  -Worse with sitting or lying down-better with standing up.  ROS-denies fever, NVD, tooth pain. Denies significant shortness of breath.  Denies sinus pressure.  Pertinent Past Medical History-  Patient Active Problem List   Diagnosis Date Noted  . History of septic arthritis 07/26/2018    Priority: High  . Osteoporosis 01/20/2014    Priority: High  . History of breast cancer 06/30/2013    Priority: High  . Hyperlipidemia 02/02/2017    Priority: Medium  . Hyperglycemia 03/19/2015    Priority: Medium  . Essential hypertension 08/03/2008    Priority: Medium  . SKIN CANCER, HX OF 08/03/2008    Priority: Low  . Cellulitis of left breast 03/28/2017    Medications- reviewed  Current Outpatient Medications  Medication Sig Dispense Refill  . alendronate (FOSAMAX) 70 MG tablet Take 1 tablet (70 mg total) by mouth once a week. Take with a full glass of water on an empty stomach. 5 tablet 11  . cholecalciferol (VITAMIN D) 1000 units tablet Take 2,000 Units by mouth daily.    . CYANOCOBALAMIN PO Take 1 tablet by mouth daily.    Marland Kitchen lisinopril (PRINIVIL,ZESTRIL) 10 MG tablet TAKE 1 TABLET BY MOUTH ONCE DAILY 90 tablet 1  . Multiple Vitamin  (MULTIVITAMIN) tablet Take 1 tablet by mouth daily.      . Pseudoeph-Doxylamine-DM-APAP (DAYQUIL/NYQUIL COLD/FLU RELIEF PO) Take by mouth.    . pyridoxine (B-6) 100 MG tablet Take 100 mg by mouth daily.    . rosuvastatin (CRESTOR) 20 MG tablet Take 1 tablet (20 mg total) by mouth 2 (two) times a week. 9 tablet 11  . senna-docusate (SENOKOT-S) 8.6-50 MG tablet Take 1 tablet by mouth at bedtime as needed for mild constipation. 30 tablet 0     Objective: BP 124/68 (BP Location: Right Arm, Patient Position: Sitting, Cuff Size: Normal)   Pulse 71   Temp 98.5 F (36.9 C) (Oral)   Ht 5' 0.5" (1.537 m)   Wt 170 lb 8 oz (77.3 kg)   SpO2 97%   BMI 32.75 kg/m  Gen: NAD, resting comfortably HEENT: Turbinates erythematous with clear drainage, TM normal, pharynx mildly erythematous with no tonsilar exudate or edema, no sinus tenderness CV: RRR no murmurs rubs or gallops Lungs: Patient with diffuse wheeze and rhonchi without crackles Ext: no edema Skin: warm, dry, no rash  Assessment/Plan:  Bronchitis History and exam today are suggestive of viral infection most likely due to bronchitis. Symptomatic treatment with: Prednisone and codeine cough syrup Discussed prednisone: We opted and given her wheeze  We discussed that we did not find any infection that had higher probability of being bacterial such as pneumonia, strep throat, ear infection, bacterial sinusitis. We discussed signs that bacterial infection may have  developed particularly fever or shortness of breath and reasons for follow up (symptoms worsen, last past expected time frame, new concerns arise).  If she has worsening symptoms-I recommended she proceed immediately to the emergency room.  Patient afebrile-we opted not to test for influenza.  She has not had known contact with covid-19 or been in a high risk area-we opted not to test for covid-19.  We did recommend proceeding to emergency room if symptoms worsen.  Likely course of 3-6  weeks. Patient is contagious and advised good handwashing and consideration of mask If going to be in public places-given age 25 I recommended she remain at home anyway over the coming weeks.   Verbally recommended no driving for 6 to 8 hours after codeine cough syrup Meds ordered this encounter  Medications  . predniSONE (DELTASONE) 20 MG tablet    Sig: Take 2 pills for 3 days, 1 pill for 4 days    Dispense:  10 tablet    Refill:  0  . guaiFENesin-codeine 100-10 MG/5ML syrup    Sig: Take 5 mLs by mouth every 6 (six) hours as needed for cough.    Dispense:  120 mL    Refill:  0   Garret Reddish, MD

## 2018-12-21 ENCOUNTER — Other Ambulatory Visit: Payer: Self-pay | Admitting: Family Medicine

## 2018-12-22 NOTE — Telephone Encounter (Signed)
Last OV 12/16/2018 Last refill 12/16/2018 130mL/0 Next OV 07/28/2019  Forwarding to Dr. Yong Channel.

## 2018-12-29 ENCOUNTER — Ambulatory Visit: Payer: Medicare HMO | Admitting: Family Medicine

## 2018-12-30 ENCOUNTER — Ambulatory Visit: Payer: Medicare HMO | Admitting: Family Medicine

## 2019-02-20 ENCOUNTER — Other Ambulatory Visit: Payer: Self-pay | Admitting: Family Medicine

## 2019-03-01 ENCOUNTER — Other Ambulatory Visit: Payer: Self-pay | Admitting: Family Medicine

## 2019-04-26 ENCOUNTER — Telehealth: Payer: Self-pay | Admitting: Family Medicine

## 2019-04-26 NOTE — Telephone Encounter (Signed)
Spoke with Briana Smith regarding AWV on 04/25/19. Patient scheduled AWV for 05/10/19. Patient also had some questions about getting labs drawn to check her cholesterol levels.

## 2019-05-10 ENCOUNTER — Other Ambulatory Visit: Payer: Self-pay

## 2019-05-10 ENCOUNTER — Ambulatory Visit (INDEPENDENT_AMBULATORY_CARE_PROVIDER_SITE_OTHER): Payer: Medicare HMO | Admitting: Family Medicine

## 2019-05-10 VITALS — BP 130/62 | Temp 97.2°F | Ht 61.0 in | Wt 174.0 lb

## 2019-05-10 DIAGNOSIS — Z0001 Encounter for general adult medical examination with abnormal findings: Secondary | ICD-10-CM | POA: Diagnosis not present

## 2019-05-10 DIAGNOSIS — Z0000 Encounter for general adult medical examination without abnormal findings: Secondary | ICD-10-CM

## 2019-05-10 NOTE — Patient Instructions (Addendum)
Briana Smith , Thank you for taking time to come for your Medicare Wellness Visit. I appreciate your ongoing commitment to your health goals. Please review the following plan we discussed and let me know if I can assist you in the future.   Screening recommendations/referrals: Mammogram: completed 10/28/18 Bone Density: completed 08/03/18  Vision and Dental Exams: Recommended annual ophthalmology exams for early detection of glaucoma and other disorders of the eye Recommended annual dental exams for proper oral hygiene  Vaccinations: Influenza vaccine: recommended this fall  Pneumococcal vaccine: completed  Tdap vaccine: completed  Shingles vaccine: completed Shingrix at pharmacy   Advanced directives: Please bring a copy of your POA (Power of [place]) and/or Living Will to your next appointment.  Goals: Recommend to drink at least 6-8 8oz glasses of water per day.  Next appointment: Please schedule your Annual Wellness Visit with your Nurse Health Advisor in one year.  Preventive Care 39 Years and Older, Female Preventive care refers to lifestyle choices and visits with your health care provider that can promote health and wellness. What does preventive care include?  A yearly physical exam. This is also called an annual well check.  Dental exams once or twice a year.  Routine eye exams. Ask your health care provider how often you should have your eyes checked.  Personal lifestyle choices, including:  Daily care of your teeth and gums.  Regular physical activity.  Eating a healthy diet.  Avoiding tobacco and drug use.  Limiting alcohol use.  Practicing safe sex.  Taking low-dose aspirin every day if recommended by your health care provider.  Taking vitamin and mineral supplements as recommended by your health care provider. What happens during an annual well check? The services and screenings done by your health care provider during your annual well check will  depend on your age, overall health, lifestyle risk factors, and family history of disease. Counseling  Your health care provider may ask you questions about your:  Alcohol use.  Tobacco use.  Drug use.  Emotional well-being.  Home and relationship well-being.  Sexual activity.  Eating habits.  History of falls.  Memory and ability to understand (cognition).  Work and work Statistician.  Reproductive health. Screening  You may have the following tests or measurements:  Height, weight, and BMI.  Blood pressure.  Lipid and cholesterol levels. These may be checked every 5 years, or more frequently if you are over 41 years old.  Skin check.  Lung cancer screening. You may have this screening every year starting at age 31 if you have a 30-pack-year history of smoking and currently smoke or have quit within the past 15 years.  Fecal occult blood test (FOBT) of the stool. You may have this test every year starting at age 90.  Flexible sigmoidoscopy or colonoscopy. You may have a sigmoidoscopy every 5 years or a colonoscopy every 10 years starting at age 85.  Hepatitis C blood test.  Hepatitis B blood test.  Sexually transmitted disease (STD) testing.  Diabetes screening. This is done by checking your blood sugar (glucose) after you have not eaten for a while (fasting). You may have this done every 1-3 years.  Bone density scan. This is done to screen for osteoporosis. You may have this done starting at age 61.  Mammogram. This may be done every 1-2 years. Talk to your health care provider about how often you should have regular mammograms. Talk with your health care provider about your test results, treatment options,  and if necessary, the need for more tests. Vaccines  Your health care provider may recommend certain vaccines, such as:  Influenza vaccine. This is recommended every year.  Tetanus, diphtheria, and acellular pertussis (Tdap, Td) vaccine. You may need a  Td booster every 10 years.  Zoster vaccine. You may need this after age 60.  Pneumococcal 13-valent conjugate (PCV13) vaccine. One dose is recommended after age 1.  Pneumococcal polysaccharide (PPSV23) vaccine. One dose is recommended after age 69. Talk to your health care provider about which screenings and vaccines you need and how often you need them. This information is not intended to replace advice given to you by your health care provider. Make sure you discuss any questions you have with your health care provider. Document Released: 10/19/2015 Document Revised: 06/11/2016 Document Reviewed: 07/24/2015 Elsevier Interactive Patient Education  2017 [employer] Prevention in the Home Falls can cause injuries. They can happen to people of all ages. There are many things you can do to make your home safe and to help prevent falls. What can I do on the outside of my home?  Regularly fix the edges of walkways and driveways and fix any cracks.  Remove anything that might make you trip as you walk through a door, such as a raised step or threshold.  Trim any bushes or trees on the path to your home.  Use bright outdoor lighting.  Clear any walking paths of anything that might make someone trip, such as rocks or tools.  Regularly check to see if handrails are loose or broken. Make sure that both sides of any steps have handrails.  Any raised decks and porches should have guardrails on the edges.  Have any leaves, snow, or ice cleared regularly.  Use sand or salt on walking paths during winter.  Clean up any spills in your garage right away. This includes oil or grease spills. What can I do in the bathroom?  Use night lights.  Install grab bars by the toilet and in the tub and shower. Do not use towel bars as grab bars.  Use non-skid mats or decals in the tub or shower.  If you need to sit down in the shower, use a plastic, non-slip stool.  Keep the floor dry. Clean  up any water that spills on the floor as soon as it happens.  Remove soap buildup in the tub or shower regularly.  Attach bath mats securely with double-sided non-slip rug tape.  Do not have throw rugs and other things on the floor that can make you trip. What can I do in the bedroom?  Use night lights.  Make sure that you have a light by your bed that is easy to reach.  Do not use any sheets or blankets that are too big for your bed. They should not hang down onto the floor.  Have a firm chair that has side arms. You can use this for support while you get dressed.  Do not have throw rugs and other things on the floor that can make you trip. What can I do in the kitchen?  Clean up any spills right away.  Avoid walking on wet floors.  Keep items that you use a lot in easy-to-reach places.  If you need to reach something above you, use a strong step stool that has a grab bar.  Keep electrical cords out of the way.  Do not use floor polish or wax that makes floors slippery. If  you must use wax, use non-skid floor wax.  Do not have throw rugs and other things on the floor that can make you trip. What can I do with my stairs?  Do not leave any items on the stairs.  Make sure that there are handrails on both sides of the stairs and use them. Fix handrails that are broken or loose. Make sure that handrails are as long as the stairways.  Check any carpeting to make sure that it is firmly attached to the stairs. Fix any carpet that is loose or worn.  Avoid having throw rugs at the top or bottom of the stairs. If you do have throw rugs, attach them to the floor with carpet tape.  Make sure that you have a light switch at the top of the stairs and the bottom of the stairs. If you do not have them, ask someone to add them for you. What else can I do to help prevent falls?  Wear shoes that:  Do not have high heels.  Have rubber bottoms.  Are comfortable and fit you well.  Are  closed at the toe. Do not wear sandals.  If you use a stepladder:  Make sure that it is fully opened. Do not climb a closed stepladder.  Make sure that both sides of the stepladder are locked into place.  Ask someone to hold it for you, if possible.  Clearly mark and make sure that you can see:  Any grab bars or handrails.  First and last steps.  Where the edge of each step is.  Use tools that help you move around (mobility aids) if they are needed. These include:  Canes.  Walkers.  Scooters.  Crutches.  Turn on the lights when you go into a dark area. Replace any light bulbs as soon as they burn out.  Set up your furniture so you have a clear path. Avoid moving your furniture around.  If any of your floors are uneven, fix them.  If there are any pets around you, be aware of where they are.  Review your medicines with your doctor. Some medicines can make you feel dizzy. This can increase your chance of falling. Ask your doctor what other things that you can do to help prevent falls. This information is not intended to replace advice given to you by your health care provider. Make sure you discuss any questions you have with your health care provider. Document Released: 07/19/2009 Document Revised: 02/28/2016 Document Reviewed: 10/27/2014 Elsevier Interactive Patient Education  2017 [employer].

## 2019-05-10 NOTE — Progress Notes (Signed)
I have reviewed and agree with note, evaluation, plan.   Stephen [name], MD  

## 2019-05-10 NOTE — Progress Notes (Signed)
Subjective:   Briana Smith is a 78 y.o. female who presents for an Initial Medicare Annual Wellness Visit.  Review of Systems     Cardiac Risk Factors include: hypertension     Objective:    Today's Vitals   05/10/19 0936  BP: 130/62  Temp: (!) 97.2 F (36.2 C)  Weight: 174 lb (78.9 kg)  Height: 5\' 1"  (1.549 m)   Body mass index is 32.88 kg/m.  Advanced Directives 05/10/2019 03/28/2017 01/31/2016 03/19/2015 01/18/2015  Does Patient Have a Medical Advance Directive? Yes Yes No Yes No  Type of Paramedic of [place];Living will [place];Living will - [place];Living will -  Does patient want to make changes to medical advance directive? No - Patient declined No - Patient declined - - -  Copy of [place] in Chart? No - copy requested No - copy requested - - -    Current Medications (verified) Outpatient Encounter Medications as of 05/10/2019  Medication Sig   alendronate (FOSAMAX) 70 MG tablet Take 1 tablet (70 mg total) by mouth once a week. Take with a full glass of water on an empty stomach.   cholecalciferol (VITAMIN D) 1000 units tablet Take 2,000 Units by mouth daily.   CYANOCOBALAMIN PO Take 1 tablet by mouth daily.   lisinopril (ZESTRIL) 10 MG tablet Take 1 tablet by mouth once daily   Multiple Vitamin (MULTIVITAMIN) tablet Take 1 tablet by mouth daily.     pyridoxine (B-6) 100 MG tablet Take 100 mg by mouth daily.   rosuvastatin (CRESTOR) 20 MG tablet Take 1 tablet (20 mg total) by mouth 2 (two) times a week.   senna-docusate (SENOKOT-S) 8.6-50 MG tablet Take 1 tablet by mouth at bedtime as needed for mild constipation.   predniSONE (DELTASONE) 20 MG tablet Take 2 pills for 3 days, 1 pill for 4 days (Patient not taking: Reported on 05/10/2019)   Pseudoeph-Doxylamine-DM-APAP (DAYQUIL/NYQUIL COLD/FLU RELIEF PO) Take by mouth.   VIRTUSSIN A/C 100-10 MG/5ML syrup TAKE 5 ML BY MOUTH   EVERY 6 HOURS AS NEEDED FOR COUGH (Patient not taking: Reported on 05/10/2019)   No facility-administered encounter medications on file as of 05/10/2019.     Allergies (verified) Patient has no known allergies.   History: Past Medical History:  Diagnosis Date   Breast cancer ([place])    Cancer ([place])    breast cancer- left   Heart murmur    years ago. no recent issues   Hyperlipidemia    Hypertension    Osteoporosis 01/20/2014   (5) DEXA scan 08/31/2013 shows osteopenia with a T score of -2.0; zolendronate started April 2015, to be repeated every 6 months while on anastrozole.  2016 dexa stable. Repeat 2019   Personal history of chemotherapy    Personal history of radiation therapy    Past Surgical History:  Procedure Laterality Date   ABDOMINAL HYSTERECTOMY  1987   fibroids   BREAST BIOPSY     BREAST LUMPECTOMY  10/23/10   left   PORT-A-CATH REMOVAL  09/02/2011   Procedure: REMOVAL PORT-A-CATH;  Surgeon: Adin Hector, MD;  Location: [facility];  Service: General;  Laterality: Right;   TOTAL HIP ARTHROPLASTY Left 04/03/2017   Procedure: IRRIGATION AND DEBRIDIMENT LEFT HIP;  Surgeon: Rod Can, MD;  Location: [facility];  Service: Orthopedics;  Laterality: Left;   Family History  Problem Relation Age of Onset   COPD Father  Diabetes Mother    Dementia Mother    Kidney disease Mother        not on dialysis   Cancer Brother        jaw   Hypertension Brother    Breast cancer Paternal Aunt    Social History   Socioeconomic History   Marital status: Married    Spouse name: Not on file   Number of children: Not on file   Years of education: Not on file   Highest education level: Not on file  Occupational History   Not on file  Social Needs   Financial resource strain: Not on file   Food insecurity    Worry: Not on file    Inability: Not on file   Transportation needs    Medical: Not on file    Non-medical: Not on file    Tobacco Use   Smoking status: Never Smoker   Smokeless tobacco: Never Used  Substance and Sexual Activity   Alcohol use: No   Drug use: No   Sexual activity: Not on file  Lifestyle   Physical activity    Days per week: Not on file    Minutes per session: Not on file   Stress: Not on file  Relationships   Social connections    Talks on phone: Not on file    Gets together: Not on file    Attends religious service: Not on file    Active member of club or organization: Not on file    Attends meetings of clubs or organizations: Not on file    Relationship status: Not on file  Other Topics Concern   Not on file  Social History Narrative   Married. Lives with husband. 3 children. 3 grandkids (22, 22, 18 in 2018).       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       Hobbies: working puzzles, reading, shoping, beach    Tobacco Counseling Counseling given: No   Clinical Intake:  Pre-visit preparation completed: Yes  Pain : No/denies pain     Diabetes: No  How often do you need to have someone help you when you read instructions, pamphlets, or other written materials from your doctor or pharmacy?: 1 - Never  Interpreter Needed?: No  Information entered by :: Denman George LPN   Activities of Daily Living In your present state of health, do you have any difficulty performing the following activities: 05/10/2019  Hearing? N  Vision? N  Difficulty concentrating or making decisions? N  Walking or climbing stairs? N  Dressing or bathing? N  Doing errands, shopping? N  Preparing Food and eating ? N  Using the Toilet? N  In the past six months, have you accidently leaked urine? N  Do you have problems with loss of bowel control? N  Managing your Medications? N  Managing your Finances? N  Housekeeping or managing your Housekeeping? N  Some recent data might be hidden     Immunizations and Health Maintenance Immunization History  Administered  Date(s) Administered   Influenza Split 07/10/2011, 07/29/2012, 08/04/2013   Influenza Whole 07/18/2009   Influenza, High Dose Seasonal PF 08/15/2015, 07/02/2017, 07/26/2018   Influenza-Unspecified 07/31/2014   Pneumococcal Conjugate-13 11/15/2013   Pneumococcal Polysaccharide-23 11/18/2005   Td 08/02/2002   Tdap 10/20/2012   Zoster 02/21/2009   Zoster Recombinat (Shingrix) 07/26/2018, 09/30/2018   Health Maintenance Due  Topic Date Due   INFLUENZA VACCINE  05/07/2019  Patient Care Team: [name], MD as PCP - General (Family Medicine) [name], MD as Consulting Physician (Dermatology)  Indicate any recent Medical Services you may have received from other than [facility] providers in the past year (date may be approximate).     Assessment:   This is a routine wellness examination for Akshaya.  Hearing/Vision screen No exam data present  Dietary issues and exercise activities discussed:    Goals   None    Depression Screen PHQ 2/9 Scores 07/26/2018 06/16/2017 02/02/2017 01/28/2016 11/16/2014 11/15/2013  PHQ - 2 Score 0 0 0 0 0 0    Fall Risk Fall Risk  07/26/2018 06/16/2017 02/02/2017 01/28/2016 11/16/2014  Falls in the past year? No No No No No    Timed Get Up and Go Performed yes   Cognitive Function:        Screening Tests Health Maintenance  Topic Date Due   INFLUENZA VACCINE  05/07/2019   MAMMOGRAM  10/29/2019   TETANUS/TDAP  10/20/2022   DEXA SCAN  Completed   PNA vac Low Risk Adult  Completed    Qualifies for Shingles Vaccine? Completed at pharmacy   Cancer Screenings: Breast: Up to date on Mammogram?   Yes  Up to date of Bone Density/Dexa? Yes      Plan:  I have personally reviewed and addressed the Medicare Annual Wellness questionnaire and have noted the following in the patients chart:  A. Medical and social history B. Use of alcohol, tobacco or illicit drugs  C. Current medications and supplements D. Functional ability  and status E.  Nutritional status F.  Physical activity G. Advance directives H. List of other physicians I.  Hospitalizations, surgeries, and ER visits in previous 12 months J.  [place] such as hearing and vision if needed, cognitive and depression L. Referrals and appointments   In addition, I have reviewed and discussed with patient certain preventive protocols, quality metrics, and best practice recommendations. A written personalized care plan for preventive services as well as general preventive health recommendations were provided to patient.   Signed,  Denman George, LPN Nurse Health Advisor   Nurse Notes:

## 2019-06-07 DIAGNOSIS — R69 Illness, unspecified: Secondary | ICD-10-CM | POA: Diagnosis not present

## 2019-06-16 ENCOUNTER — Other Ambulatory Visit: Payer: Self-pay

## 2019-06-17 MED ORDER — ROSUVASTATIN CALCIUM 20 MG PO TABS: 20.0000 mg | ORAL_TABLET | ORAL | 1 refills | Status: DC

## 2019-07-20 NOTE — Progress Notes (Signed)
Phone: [phone]   Subjective:  Patient presents today for their annual physical. Chief complaint-noted.   See problem oriented charting- ROS- full  review of systems was completed and negative except for: abdominal pain, constipation, light headedness, bruising easily and joint pain.  The following were reviewed and entered/updated in epic: Past Medical History:  Diagnosis Date  . Breast cancer ([place])   . Cancer [facility])    breast cancer- left  . Heart murmur    years ago. no recent issues  . Hyperlipidemia   . Hypertension   . Osteoporosis 01/20/2014   (5) DEXA scan 08/31/2013 shows osteopenia with a T score of -2.0; zolendronate started April 2015, to be repeated every 6 months while on anastrozole.  2016 dexa stable. Repeat 2019  . Personal history of chemotherapy   . Personal history of radiation therapy    Patient Active Problem List   Diagnosis Date Noted  . History of septic arthritis 07/26/2018    Priority: High  . Osteoporosis 01/20/2014    Priority: High  . History of breast cancer 06/30/2013    Priority: High  . Hyperlipidemia 02/02/2017    Priority: Medium  . Hyperglycemia 03/19/2015    Priority: Medium  . Essential hypertension 08/03/2008    Priority: Medium  . SKIN CANCER, HX OF 08/03/2008    Priority: Low  . Cellulitis of left breast 03/28/2017   Past Surgical History:  Procedure Laterality Date  . ABDOMINAL HYSTERECTOMY  1987   fibroids  . BREAST BIOPSY    . BREAST LUMPECTOMY  10/23/10   left  . PORT-A-CATH REMOVAL  09/02/2011   Procedure: REMOVAL PORT-A-CATH;  Surgeon: Adin Hector, MD;  Location: [place];  Service: General;  Laterality: Right;  . TOTAL HIP ARTHROPLASTY Left 04/03/2017   Procedure: IRRIGATION AND DEBRIDIMENT LEFT HIP;  Surgeon: Rod Can, MD;  Location: [facility];  Service: Orthopedics;  Laterality: Left;    Family History  Problem Relation Age of Onset  . COPD Father   . Diabetes Mother   . Dementia  Mother   . Kidney disease Mother        not on dialysis  . Cancer Brother        jaw  . Hypertension Brother   . Breast cancer Paternal Aunt     Medications- reviewed and updated Current Outpatient Medications  Medication Sig Dispense Refill  . alendronate (FOSAMAX) 70 MG tablet Take 1 tablet (70 mg total) by mouth once a week. Take with a full glass of water on an empty stomach. 5 tablet 11  . cholecalciferol (VITAMIN D) 1000 units tablet Take 2,000 Units by mouth daily.    . CYANOCOBALAMIN PO Take 1 tablet by mouth daily.    . Multiple Vitamin (MULTIVITAMIN) tablet Take 1 tablet by mouth daily.      . Pseudoeph-Doxylamine-DM-APAP (DAYQUIL/NYQUIL COLD/FLU RELIEF PO) Take by mouth.    . pyridoxine (B-6) 100 MG tablet Take 100 mg by mouth daily.    . rosuvastatin (CRESTOR) 20 MG tablet Take 1 tablet (20 mg total) by mouth 2 (two) times a week. 24 tablet 1  . senna-docusate (SENOKOT-S) 8.6-50 MG tablet Take 1 tablet by mouth at bedtime as needed for mild constipation. 30 tablet 0  . VIRTUSSIN A/C 100-10 MG/5ML syrup TAKE 5 ML BY MOUTH  EVERY 6 HOURS AS NEEDED FOR COUGH 120 mL 0  . lisinopril (ZESTRIL) 5 MG tablet Take 1 tablet (5 mg total) by mouth daily. 90 tablet 3  No current facility-administered medications for this visit.     Allergies-reviewed and updated No Known Allergies  Social History   Social History Narrative   Married. Lives with husband. 3 children. 3 grandkids (22, 22, 18 in 2018).       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       Hobbies: working puzzles, reading, shoping, beach   Objective  Objective:  BP 122/60   Pulse 65   Temp 97.6 F (36.4 C)   Ht 5\' 1"  (1.549 m)   Wt 171 lb 12.8 oz (77.9 kg)   SpO2 98%   BMI 32.46 kg/m  Gen: NAD, resting comfortably HEENT: Mask not removed due to covid 19. TM normal. Bridge of nose normal. Eyelids normal.  Neck: no thyromegaly or cervical lymphadenopathy  Breasts: normal appearance other than  lumpectomy scar on left, no masses or tenderness CV: RRR no murmurs rubs or gallops Lungs: CTAB no crackles, wheeze, rhonchi Abdomen: soft/nontender/nondistended/normal bowel sounds. No rebound or guarding.  Ext: no edema Skin: warm, dry Neuro: grossly normal, moves all extremities, PERRLA   Assessment and Plan    79 y.o. female presenting for annual physical.  Health Maintenance counseling: 1. Anticipatory guidance: Patient counseled regarding regular dental exams -q6 months, eye exams - yearly,  avoiding smoking and second hand smoke , limiting alcohol to 1 beverage per day- doesn't drink .   2. Risk factor reduction:  Advised patient of need for regular exercise and diet rich and fruits and vegetables to reduce risk of heart attack and stroke. Exercise- walking on treadmill several times a week. Diet-down 3 lbs from august- tried to cut back on sugar. 5-10 lbs off goal by next year.  Wt Readings from Last 3 Encounters:  07/28/19 171 lb 12.8 oz (77.9 kg)  05/10/19 174 lb (78.9 kg)  12/16/18 170 lb 8 oz (77.3 kg)  3. Immunizations/screenings/ancillary studies- flu shot today Immunization History  Administered Date(s) Administered  . Fluad Quad(high Dose 65+) 07/28/2019  . Influenza Split 07/10/2011, 07/29/2012, 08/04/2013  . Influenza Whole 07/18/2009  . Influenza, High Dose Seasonal PF 08/15/2015, 07/02/2017, 07/26/2018  . Influenza-Unspecified 07/31/2014  . Pneumococcal Conjugate-13 11/15/2013  . Pneumococcal Polysaccharide-23 11/18/2005  . Td 08/02/2002  . Tdap 10/20/2012  . Zoster 02/21/2009  . Zoster Recombinat (Shingrix) 07/26/2018, 09/30/2018  4. Cervical cancer screening- never had abnormal pap smear. Past age based screening.  5. Breast cancer screening-  Breast cancer history. Normal mammogram 10/28/2018.  Lumpectomy left as well as chemo and radiation.  6. Colon cancer screening - reports only colonoscopy she had was 08/2008- we reviewed pathology today and had  hyperplastic polyp- she does not have to have another colonoscopy.  7. Skin cancer screening-  No recent dermatology visit- is considering repeat with Dr. Nevada Crane. advised regular sunscreen use. Denies worrisome, changing, or new skin lesions.  8. Birth control/STD check- postmenopausal and mogomous 9. Osteoporosis screening at 64- started fosamax 2019. Repeat bone density oct 2021 or 2022. Also on calcium and vitamin d -Never smoker  Status of chronic or acute concerns   #social update-  husband may have parkinsons   Hyperlipidemia-  on crestor 20mg  twice a week in past- update lipid panel  Hard time losing weight and has HLD- wants to check tsh   Hyperglycemia- update a1c and focus on weight loss- consider metformin if a1c trending up  Lab Results  Component Value Date   HGBA1C 6.1 07/26/2018   See separate problem oriented note  due to acute issues/chronic condition changes  Recommended follow up:  6 month follow up   Lab/Order associations: fasting   ICD-10-CM   1. Preventative health care  Z00.00 CBC    Comprehensive metabolic panel    Lipid panel    Hemoglobin A1c  2. Need for immunization against influenza  Z23 Flu Vaccine QUAD High Dose(Fluad)  3. Age-related osteoporosis without current pathological fracture  M81.0   4. Hyperlipidemia, unspecified hyperlipidemia type  E78.5 CBC    Comprehensive metabolic panel    Lipid panel    TSH   Return precautions advised.  Garret Reddish, MD

## 2019-07-20 NOTE — Patient Instructions (Addendum)
Health Maintenance Due  Topic Date Due  . INFLUENZA VACCINE -today 05/07/2019   We will call you within two weeks about your referral for CT of your sinuses and to Dr. Tamala Julian of sports medicine . If you do not hear within 3 weeks, give [place] a call.   Reduce lisinopril to 5 mg as long as blood pressure stays <140/90- see [place] back if goes above this   Try half capful of miralax once a day for 7 days - hold for loos stools.  Please stop by lab before you go If you do not have mychart- we will call you about results within 5 business days of [place] receiving them.  If you have mychart- we will send your results within 3 business days of [place] receiving them.  If abnormal or we want to clarify a result, we will call or mychart you to make sure you receive the message.  If you have questions or concerns or don't hear within 5-7 days, please send [place] a message or call us.

## 2019-07-28 ENCOUNTER — Encounter: Payer: Self-pay | Admitting: Family Medicine

## 2019-07-28 ENCOUNTER — Ambulatory Visit (INDEPENDENT_AMBULATORY_CARE_PROVIDER_SITE_OTHER): Payer: Medicare HMO | Admitting: Family Medicine

## 2019-07-28 ENCOUNTER — Other Ambulatory Visit: Payer: Self-pay

## 2019-07-28 VITALS — BP 122/60 | HR 65 | Temp 97.6°F | Ht 61.0 in | Wt 171.8 lb

## 2019-07-28 DIAGNOSIS — M810 Age-related osteoporosis without current pathological fracture: Secondary | ICD-10-CM | POA: Diagnosis not present

## 2019-07-28 DIAGNOSIS — Z23 Encounter for immunization: Secondary | ICD-10-CM

## 2019-07-28 DIAGNOSIS — R1032 Left lower quadrant pain: Secondary | ICD-10-CM | POA: Diagnosis not present

## 2019-07-28 DIAGNOSIS — M79672 Pain in left foot: Secondary | ICD-10-CM | POA: Diagnosis not present

## 2019-07-28 DIAGNOSIS — R739 Hyperglycemia, unspecified: Secondary | ICD-10-CM | POA: Diagnosis not present

## 2019-07-28 DIAGNOSIS — Z0000 Encounter for general adult medical examination without abnormal findings: Secondary | ICD-10-CM

## 2019-07-28 DIAGNOSIS — E785 Hyperlipidemia, unspecified: Secondary | ICD-10-CM | POA: Diagnosis not present

## 2019-07-28 DIAGNOSIS — R519 Headache, unspecified: Secondary | ICD-10-CM

## 2019-07-28 DIAGNOSIS — I10 Essential (primary) hypertension: Secondary | ICD-10-CM | POA: Diagnosis not present

## 2019-07-28 LAB — COMPREHENSIVE METABOLIC PANEL
ALT: 15 U/L (ref 0–35)
AST: 15 U/L (ref 0–37)
Albumin: 4.5 g/dL (ref 3.5–5.2)
Alkaline Phosphatase: 42 U/L (ref 39–117)
BUN: 19 mg/dL (ref 6–23)
CO2: 28 mEq/L (ref 19–32)
Calcium: 9.6 mg/dL (ref 8.4–10.5)
Chloride: 106 mEq/L (ref 96–112)
Creatinine, Ser: 0.96 mg/dL (ref 0.40–1.20)
GFR: 55.82 mL/min — ABNORMAL LOW (ref >60.00)
Glucose, Bld: 109 mg/dL — ABNORMAL HIGH (ref 70–99)
Potassium: 4.9 mEq/L (ref 3.5–5.1)
Sodium: 141 mEq/L (ref 135–145)
Total Bilirubin: 0.5 mg/dL (ref 0.2–1.2)
Total Protein: 7 g/dL (ref 6.0–8.3)

## 2019-07-28 LAB — LIPID PANEL
Cholesterol: 164 mg/dL (ref 0–200)
HDL: 62.4 mg/dL (ref >39.00)
LDL Cholesterol: 87 mg/dL (ref 0–99)
NonHDL: 101.21
Total CHOL/HDL Ratio: 3
Triglycerides: 71 mg/dL (ref 0.0–149.0)
VLDL: 14.2 mg/dL (ref 0.0–40.0)

## 2019-07-28 LAB — CBC
HCT: 40 % (ref 36.0–46.0)
Hemoglobin: 13.1 g/dL (ref 12.0–15.0)
MCHC: 32.8 g/dL (ref 30.0–36.0)
MCV: 93.8 fl (ref 78.0–100.0)
Platelets: 153 10*3/uL (ref 150.0–400.0)
RBC: 4.27 Mil/uL (ref 3.87–5.11)
RDW: 13.7 % (ref 11.5–15.5)
WBC: 6.1 10*3/uL (ref 4.0–10.5)

## 2019-07-28 LAB — HEMOGLOBIN A1C: Hgb A1c MFr Bld: 6.2 % (ref 4.6–6.5)

## 2019-07-28 LAB — TSH: TSH: 3.6 u[IU]/mL (ref 0.35–4.50)

## 2019-07-28 MED ORDER — LISINOPRIL 5 MG PO TABS
5.0000 mg | ORAL_TABLET | Freq: Every day | ORAL | 3 refills | Status: DC
Start: 2019-07-28 — End: 2020-01-25

## 2019-07-28 NOTE — Progress Notes (Signed)
Phone [phone]   Subjective:  Briana Smith is a 78 y.o. year old very pleasant female patient who presents for/with See problem oriented charting- here for CPE but had acute and chronic concerns addressed below  ROS- no fever, chills, nausea, vomiting. Has had constipation.    Past Medical History-  Patient Active Problem List   Diagnosis Date Noted  . History of septic arthritis 07/26/2018    Priority: High  . Osteoporosis 01/20/2014    Priority: High  . History of breast cancer 06/30/2013    Priority: High  . Hyperlipidemia 02/02/2017    Priority: Medium  . Hyperglycemia 03/19/2015    Priority: Medium  . Essential hypertension 08/03/2008    Priority: Medium  . SKIN CANCER, HX OF 08/03/2008    Priority: Low  . Cellulitis of left breast 03/28/2017    Medications- reviewed and updated Current Outpatient Medications  Medication Sig Dispense Refill  . alendronate (FOSAMAX) 70 MG tablet Take 1 tablet (70 mg total) by mouth once a week. Take with a full glass of water on an empty stomach. 5 tablet 11  . cholecalciferol (VITAMIN D) 1000 units tablet Take 2,000 Units by mouth daily.    . CYANOCOBALAMIN PO Take 1 tablet by mouth daily.    . Multiple Vitamin (MULTIVITAMIN) tablet Take 1 tablet by mouth daily.      . Pseudoeph-Doxylamine-DM-APAP (DAYQUIL/NYQUIL COLD/FLU RELIEF PO) Take by mouth.    . pyridoxine (B-6) 100 MG tablet Take 100 mg by mouth daily.    . rosuvastatin (CRESTOR) 20 MG tablet Take 1 tablet (20 mg total) by mouth 2 (two) times a week. 24 tablet 1  . senna-docusate (SENOKOT-S) 8.6-50 MG tablet Take 1 tablet by mouth at bedtime as needed for mild constipation. 30 tablet 0  . VIRTUSSIN A/C 100-10 MG/5ML syrup TAKE 5 ML BY MOUTH  EVERY 6 HOURS AS NEEDED FOR COUGH 120 mL 0  . lisinopril (ZESTRIL) 5 MG tablet Take 1 tablet (5 mg total) by mouth daily. 90 tablet 3   No current facility-administered medications for this visit.      Objective:  BP 122/60    Pulse 65   Temp 97.6 F (36.4 C)   Ht 5\' 1"  (1.549 m)   Wt 171 lb 12.8 oz (77.9 kg)   SpO2 98%   BMI 32.46 kg/m  Patient with mild tenderness over lateral maxillary sinus Left ankle and right ankle- good rom Left achilles tendon slightly enlarged and tenderness at insertion point at left heel.      Assessment and Plan   # Left facial pain S:noted pain over left maxillary sinus lateral portion about 2 years ago. Has seen her dentist and they did not think it was likely a dental issue but does have a cracked tooth that could possibly be associated. She feels like the bone on the left is slightly enlarged compared to the right. Pain/soreness with touching only.    Pain started a year prior to fosamax and has not worsened on fosamax.  A/P: Left facial pain over maxillary bone for 2 years now- unclear etiology. Will get CT maxillofacial. Patient feels there is a growth in the area of bone- rule out bony mass/cancer  #hypertension S: controlled on lisinopril 10mg , pt states her BPs are runnin good at home 120s over 60s, she denies HA, blurred vision and chest discomfort and her diet and exercise are good per her report. Does have some lightheadedness with standing that is bothering her BP  Readings from Last 3 Encounters:  07/28/19 122/60  05/10/19 130/62  12/16/18 124/68  A/P: perhaps overcontrolled- will target <140/90 and decrease lisinopril to 5 mg- she will update me if home readings begin to elevate.   # Left heel pain S:going on 3 weeks hurting in the back of the heel and seems to radiate up the left leg to the knee. Pain moderate mostly but when first stands up can be severe.  Occasionally feels like it radiates up the leg- has not noted edema. No treatments yet tried. Not worsening over time.  A/P: concern about achilles tendonitis or insertional tendonitis. Advised relative rest until appointment with sports medicine- referred today.    # Left lower quadrant pain S:2-3 months of  mild intermittent left lower quadrant pain. With history of breast cancer she worries about colon cancer. Last colonoscopy 2009 and has aged out. No blood in stool. No treatments yet tried. Not worsenign over time.  A/P: update cbc, cmp due to LLQ pain- will also get stool cards. Concern for constipation as cause.  Has had some constipation and advised miralax as per AVS.   Recommended follow up: as needed for acute issues if not improving.   Lab/Order associations:  Essential hypertension  Left facial pain - Plan: CT Maxillofacial W/Cm  LLQ pain - Plan: Fecal occult blood, imunochemical, Fecal occult blood, imunochemical  Pain of left heel - Plan: Ambulatory referral to Sports Medicine   Meds ordered this encounter  Medications  . lisinopril (ZESTRIL) 5 MG tablet    Sig: Take 1 tablet (5 mg total) by mouth daily.    Dispense:  90 tablet    Refill:  3    Return precautions advised.  Garret Reddish, MD

## 2019-08-08 LAB — FECAL OCCULT BLOOD, IMMUNOCHEMICAL: Fecal Occult Bld: NEGATIVE

## 2019-08-09 ENCOUNTER — Other Ambulatory Visit: Payer: Self-pay

## 2019-08-09 ENCOUNTER — Ambulatory Visit: Payer: Self-pay

## 2019-08-09 ENCOUNTER — Encounter: Payer: Self-pay | Admitting: Family Medicine

## 2019-08-09 ENCOUNTER — Ambulatory Visit (INDEPENDENT_AMBULATORY_CARE_PROVIDER_SITE_OTHER): Payer: Medicare HMO | Admitting: Family Medicine

## 2019-08-09 VITALS — BP 150/84 | HR 65 | Ht 61.0 in | Wt 175.2 lb

## 2019-08-09 DIAGNOSIS — M7662 Achilles tendinitis, left leg: Secondary | ICD-10-CM

## 2019-08-09 DIAGNOSIS — M25562 Pain in left knee: Secondary | ICD-10-CM

## 2019-08-09 MED ORDER — DICLOFENAC SODIUM 1 % TD GEL: 4.0000 g | Freq: Four times a day (QID) | TRANSDERMAL | 11 refills | Status: DC

## 2019-08-09 NOTE — Patient Instructions (Addendum)
Please perform the exercise program that we have prepared for you and gone over in detail on a daily basis.  In addition to the handout you were provided you can access your program through: [email or web address]   Your unique program code is:  Briana Smith   You had an injection today.  Things to be aware of after injection are listed below: . You may experience no significant improvement or even a slight worsening in your symptoms during the first 24 to 48 hours.  After that we expect your symptoms to improve gradually over the next 2 weeks for the medicine to have its maximal effect.  You should continue to have improvement out to 6 weeks after your injection. . Dr. Georgina Snell recommends icing the site of the injection for 20 minutes  1-2 times the day of your injection . You may shower but no swimming, tub bath or Jacuzzi for 24 hours. . If your bandage falls off this does not need to be replaced.  It is appropriate to remove the bandage after 4 hours. . You may resume light activities as tolerated unless otherwise directed per Dr. Georgina Snell during your visit  POSSIBLE STEROID SIDE EFFECTS:  Side effects from injectable steroids tend to be less than when taken orally however you may experience some of the symptoms listed below.  If experienced these should only last for a short period of time. Change in menstrual flow  Edema (swelling)  Increased appetite Skin flushing (redness)  Skin rash/acne  Thrush (oral) Yeast vaginitis    Increased sweating  Depression Increased blood glucose levels Cramping and leg/calf  Euphoria (feeling happy)  POSSIBLE PROCEDURE SIDE EFFECTS: The side effects of the injection are usually fairly minimal however if you may experience some of the following side effects that are usually self-limited and will is off on their own.  If you are concerned please feel free to call the office with questions:  Increased numbness or tingling  Nausea or vomiting  Swelling or bruising at  the injection site   Please call our office if if you experience any of the following symptoms over the next week as these can be signs of infection:   Fever greater than 100.103F  Significant swelling at the injection site  Significant redness or drainage from the injection site  If after 2 weeks you are continuing to have worsening symptoms please call our office to discuss what the next appropriate actions should be including the potential for a return office visit or other diagnostic testing.     See me back in 4 weeks.

## 2019-08-09 NOTE — Progress Notes (Addendum)
Briana Smith is a 78 y.o. female who presents to [facility] today for L heel pain.  I, Wendy Poet, LAT, ATC, am serving as scribe for Dr. Lynne Leader.  Left heel pain: Patient has a 4-week history of pain at the posterior aspect of the left heel sometimes radiating to the posterior aspect the leg to the knee.  Pain is typically worse when she stands up in the morning and can be sometimes severe.  Patient was seen by her primary care provider Dr. Yong Channel on October 22 who suspected Achilles tendinitis and recommend follow-up with me in the near future.    Left knee pain: She states that when she gets up after sitting w/ her legs elevated she has more pain in her L knee.  Pain is located predominantly at the anterior aspect of the knee and into the medial lateral aspect of the knee.  Pt rates her pain at a 5/10 sharp pain when she first gets up.  Her L knee is bothering her more at this point than her L heel.  She notes that her L heel is still sore but improved from before.  She tried IBU but that upset her stomach so is no longer taking.  She denies any injury locking or catching.  She does note grinding in her knee.  ROS:  As above  Exam:  BP (!) 150/84 (BP Location: Left Arm, Patient Position: Sitting, Cuff Size: Normal)   Pulse 65   Ht 5\' 1"  (1.549 m)   Wt 175 lb 3.2 oz (79.5 kg)   SpO2 96%   BMI 33.10 kg/m  Wt Readings from Last 5 Encounters:  08/09/19 175 lb 3.2 oz (79.5 kg)  07/28/19 171 lb 12.8 oz (77.9 kg)  05/10/19 174 lb (78.9 kg)  12/16/18 170 lb 8 oz (77.3 kg)  08/30/18 169 lb (76.7 kg)   General: Well Developed, well nourished, and in no acute distress.  Neuro/Psych: Alert and oriented x3, extra-ocular muscles intact, able to move all 4 extremities, sensation grossly intact. Skin: Warm and dry, no rashes noted.  Respiratory: Not using accessory muscles, speaking in full sentences, trachea midline.  Cardiovascular: Pulses palpable, no extremity  edema. Abdomen: Does not appear distended. MSK:  Left knee: Slight valgus deformity with no significant effusion or erythema.  Otherwise normal. Range of motion 0-120 degrees with retropatellar crepitations. Nontender anterior knee.  Tender palpation medial and lateral joint line worse on medial joint line. Stable ligamentous exam. Intact extension and flexion strength.  Left heel: Slight swelling at distal Achilles tendon insertion onto the calcaneus.  Otherwise normal-appearing. Normal foot and ankle motion. Tender palpation at posterior aspect of calcaneus at Achilles tendon insertion. Pulses cap refill and sensation are intact in the foot.    Lab and Radiology Results Limited musculoskeletal ultrasound left Achilles tendon and calcaneus. Normal appearing Achilles tendon until insertion. Calcification and heel spur present at insertion onto Achilles tendon. Impression: Calcific Achilles tendinitis  Left knee: Normal-appearing patellar tendon and anterior structures. Narrowing of medial and lateral joint line with slight spur formation. No obvious meniscus tear. Impression: DJD  Procedure: Real-time Ultrasound Guided Injection of left knee Device: GE Logiq E   Images permanently stored and available for review in the ultrasound unit. Verbal informed consent obtained.  Discussed risks and benefits of procedure. Warned about infection bleeding damage to structures skin hypopigmentation and fat atrophy among others. Patient expresses understanding and agreement Time-out conducted.   Noted no overlying erythema,  induration, or other signs of local infection.   Skin prepped in a sterile fashion.   Local anesthesia: Topical Ethyl chloride.   With sterile technique and under real time ultrasound guidance:  40 mg of Kenalog and 4 mL of Marcaine injected easily.   Completed without difficulty   Pain immediately resolved suggesting accurate placement of the medication.   Advised to  call if fevers/chills, erythema, induration, drainage, or persistent bleeding.   Images permanently stored and available for review in the ultrasound unit.  Impression: Technically successful ultrasound guided injection.        Assessment and Plan: 78 y.o. 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  Plan for diclofenac gel and eccentric exercises.  ATC performed teaching for these activities. Recheck back in 4 weeks.  Return sooner if needed.  Next step if not improving would be possibly nitroglycerin patches, formal physical therapy, or even PRP injection.  Left knee pain: Starting about 1 week after her heel pain started.  She has evidence of DJD on exam today and I suspect the pain is likely due to altered gait from her heel pain.  Plan for injection as above as well as diclofenac gel.  If no improvement would proceed with x-ray for further evaluation.  Recheck back in 4 weeks.  Precautions reviewed.   PDMP not reviewed this encounter. Orders Placed This Encounter  Procedures  . [place] LIMITED JOINT SPACE STRUCTURES LOW LEFT    Order Specific Question:   Reason for Exam (SYMPTOM  OR DIAGNOSIS REQUIRED)    Answer:   L knee pain    Order Specific Question:   Preferred imaging location?    Answer:   [facility] ordered this encounter  Medications  . diclofenac sodium (VOLTAREN) 1 % GEL    Sig: Apply 4 g topically 4 (four) times daily. To affected joint.    Dispense:  100 g    Refill:  11    Historical information moved to improve visibility of documentation.  Past Medical History:  Diagnosis Date  . Breast cancer ([place])   . Cancer [facility])    breast cancer- left  . Heart murmur    years ago. no recent issues  . Hyperlipidemia   . Hypertension   . Osteoporosis 01/20/2014   (5) DEXA scan 08/31/2013 shows osteopenia with a T score of -2.0; zolendronate started April 2015, to be repeated every 6 months while on  anastrozole.  2016 dexa stable. Repeat 2019  . Personal history of chemotherapy   . Personal history of radiation therapy    Past Surgical History:  Procedure Laterality Date  . ABDOMINAL HYSTERECTOMY  1987   fibroids  . BREAST BIOPSY    . BREAST LUMPECTOMY  10/23/10   left  . PORT-A-CATH REMOVAL  09/02/2011   Procedure: REMOVAL PORT-A-CATH;  Surgeon: Adin Hector, MD;  Location: [place];  Service: General;  Laterality: Right;  . TOTAL HIP ARTHROPLASTY Left 04/03/2017   Procedure: IRRIGATION AND DEBRIDIMENT LEFT HIP;  Surgeon: Rod Can, MD;  Location: [facility];  Service: Orthopedics;  Laterality: Left;   Social History   Tobacco Use  . Smoking status: Never Smoker  . Smokeless tobacco: Never Used  Substance Use Topics  . Alcohol use: No   family history includes Breast cancer in her paternal aunt; COPD in her father; Cancer in her brother; Dementia in her mother; Diabetes in her mother; Hypertension in  her brother; Kidney disease in her mother.  Medications: Current Outpatient Medications  Medication Sig Dispense Refill  . alendronate (FOSAMAX) 70 MG tablet Take 1 tablet (70 mg total) by mouth once a week. Take with a full glass of water on an empty stomach. 5 tablet 11  . cholecalciferol (VITAMIN D) 1000 units tablet Take 2,000 Units by mouth daily.    . CYANOCOBALAMIN PO Take 1 tablet by mouth daily.    Marland Kitchen lisinopril (ZESTRIL) 5 MG tablet Take 1 tablet (5 mg total) by mouth daily. 90 tablet 3  . Multiple Vitamin (MULTIVITAMIN) tablet Take 1 tablet by mouth daily.      . Pseudoeph-Doxylamine-DM-APAP (DAYQUIL/NYQUIL COLD/FLU RELIEF PO) Take by mouth.    . pyridoxine (B-6) 100 MG tablet Take 100 mg by mouth daily.    . rosuvastatin (CRESTOR) 20 MG tablet Take 1 tablet (20 mg total) by mouth 2 (two) times a week. 24 tablet 1  . senna-docusate (SENOKOT-S) 8.6-50 MG tablet Take 1 tablet by mouth at bedtime as needed for mild constipation. 30 tablet 0  .  VIRTUSSIN A/C 100-10 MG/5ML syrup TAKE 5 ML BY MOUTH  EVERY 6 HOURS AS NEEDED FOR COUGH 120 mL 0  . diclofenac sodium (VOLTAREN) 1 % GEL Apply 4 g topically 4 (four) times daily. To affected joint. 100 g 11   No current facility-administered medications for this visit.    No Known Allergies    Discussed warning signs or symptoms. Please see discharge instructions. Patient expresses understanding.  The above documentation has been reviewed and is accurate and complete Lynne Leader

## 2019-08-17 ENCOUNTER — Inpatient Hospital Stay: Admission: RE | Admit: 2019-08-17 | Payer: Medicare HMO | Source: Ambulatory Visit

## 2019-08-30 ENCOUNTER — Other Ambulatory Visit: Payer: Self-pay

## 2019-08-30 ENCOUNTER — Ambulatory Visit (INDEPENDENT_AMBULATORY_CARE_PROVIDER_SITE_OTHER)
Admission: RE | Admit: 2019-08-30 | Discharge: 2019-08-30 | Disposition: A | Discharge status: Home / Self Care | Payer: Medicare HMO | Source: Ambulatory Visit | Attending: Family Medicine | Admitting: Family Medicine

## 2019-08-30 DIAGNOSIS — R519 Headache, unspecified: Secondary | ICD-10-CM | POA: Diagnosis not present

## 2019-08-30 MED ORDER — IOHEXOL 300 MG/ML  SOLN
80.0000 mL | Freq: Once | INTRAMUSCULAR | Status: AC | PRN
  Administered 2019-08-30: 80 mL via INTRAVENOUS

## 2019-09-05 ENCOUNTER — Other Ambulatory Visit: Payer: Self-pay

## 2019-09-06 ENCOUNTER — Ambulatory Visit (INDEPENDENT_AMBULATORY_CARE_PROVIDER_SITE_OTHER): Payer: Medicare HMO

## 2019-09-06 ENCOUNTER — Ambulatory Visit (INDEPENDENT_AMBULATORY_CARE_PROVIDER_SITE_OTHER): Payer: Medicare HMO | Admitting: Family Medicine

## 2019-09-06 ENCOUNTER — Encounter: Payer: Self-pay | Admitting: Family Medicine

## 2019-09-06 VITALS — BP 140/80 | HR 71 | Ht 61.0 in | Wt 171.8 lb

## 2019-09-06 DIAGNOSIS — M7662 Achilles tendinitis, left leg: Secondary | ICD-10-CM | POA: Diagnosis not present

## 2019-09-06 DIAGNOSIS — M25562 Pain in left knee: Secondary | ICD-10-CM | POA: Diagnosis not present

## 2019-09-06 NOTE — Patient Instructions (Signed)
Thank you for coming in today. Ok to to keep an eye on your pain.  We can do more if needed.  Continue the voltaren gel.  Continue the heel exercises intermintantly.  Consider straight leg raises in bed. Recheck as needed.   We're moving!  Dr. Clovis Riley new office will be located at [address] on the 1st floor.  This location is across the street from the [employer] and in the same complex as the [facility] and [employer]  Our new office phone number will be [phone].  We anticipate beginning to see patients at the [facility] office in early December 2020.

## 2019-09-06 NOTE — Progress Notes (Signed)
I, Wendy Poet, LAT, ATC, am serving as scribe for Dr. Lynne Leader.  Briana Smith is a 79 y.o. female who presents to [place] today for f/u of L knee pain and L Achille's pain.  She was last seen by Dr. Georgina Snell on 08/09/19 and received a L knee injection and was provided w/ a HEP consisting of eccentric calf raises and calf stretching for her L Achille's.  Since her last visit, the pt reports  Left knee pain: Pain is overall diminished from her visit 4 weeks ago.  However she notes the injection did not provide any pain relief until about 2 weeks ago.  She continues to have some pain especially located at the medial compartment of her knee.  She feels the pain is sharp sensation when she stands up after being seated for a while.  She notes that she did also stop Crestor and Fosamax and thinks that may have improved her pain a bit as well.  She is not sure how much of the pain control has been due to the injection versus stopping Crestor and Fosamax.    Left Achilles tendonitis: She has this pain has improved quite a bit.  She no longer is doing the home exercise program as she no longer has any pain.      ROS:  As above  Exam:  BP 140/80 (BP Location: Right Arm, Patient Position: Sitting, Cuff Size: Normal)   Pulse 71   Ht 5\' 1"  (1.549 m)   Wt 171 lb 12.8 oz (77.9 kg)   SpO2 96%   BMI 32.46 kg/m   Wt Readings from Last 5 Encounters:  09/06/19 171 lb 12.8 oz (77.9 kg)  08/09/19 175 lb 3.2 oz (79.5 kg)  07/28/19 171 lb 12.8 oz (77.9 kg)  05/10/19 174 lb (78.9 kg)  12/16/18 170 lb 8 oz (77.3 kg)   General: Well Developed, well nourished, and in no acute distress.  Neuro/Psych: Alert and oriented x3, extra-ocular muscles intact, able to move all 4 extremities, sensation grossly intact. Skin: Warm and dry, no rashes noted.  Respiratory: Not using accessory muscles, speaking in full sentences, trachea midline.  Cardiovascular: Pulses palpable, no extremity edema.  Abdomen: Does not appear distended. MSK:  Left knee: Relatively normal-appearing without effusion. Range of motion 0-100 degrees with crepitation.  Tender palpation medial joint line.    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  Mild to moderate at patellofemoral compartment.  No acute fractures visible. Await formal radiology review    Assessment and Plan: 78 y.o. female with  Left knee pain.  Improved but not fully resolved over the last month.  Patient did have some response to the treatment including topical diclofenac gel and possibly the steroid injection.  She also appears to have had some response by discontinuing her Crestor which may have been causing some arthralgia/myalgia.  We discussed potential further treatment options including trial of hyaluronic acid injection or PRP injection and she elects to continue conservative management and watchful waiting.  Work on Forensic scientist and recheck as needed.  Achilles tendinopathy: Improved left-sided.  Watchful waiting continue home exercise program.  Check as needed.   PDMP not reviewed this encounter. Orders Placed This Encounter  Procedures  . XR Knee Complete 4 Views Left    Please include patellar sunrise, lateral, and weightbearing bilateral AP and bilateral rosenberg views    Standing Status:  Future    Number of Occurrences:   1    Standing Expiration Date:   11/05/2020    Order Specific Question:   Reason for exam:    Answer:   Please include patellar sunrise, lateral, and weightbearing AP and rosenberg views    Comments:   Please include patellar sunrise, lateral, and weightbearing bilateral AP and bilateral rosenberg views    Order Specific Question:   Preferred imaging location?    Answer:   [facility]   No orders of the defined types were placed in this encounter.   Historical  information moved to improve visibility of documentation.  Past Medical History:  Diagnosis Date  . Breast cancer ([place])   . Cancer [facility])    breast cancer- left  . Heart murmur    years ago. no recent issues  . Hyperlipidemia   . Hypertension   . Osteoporosis 01/20/2014   (5) DEXA scan 08/31/2013 shows osteopenia with a T score of -2.0; zolendronate started April 2015, to be repeated every 6 months while on anastrozole.  2016 dexa stable. Repeat 2019  . Personal history of chemotherapy   . Personal history of radiation therapy    Past Surgical History:  Procedure Laterality Date  . ABDOMINAL HYSTERECTOMY  1987   fibroids  . BREAST BIOPSY    . BREAST LUMPECTOMY  10/23/10   left  . PORT-A-CATH REMOVAL  09/02/2011   Procedure: REMOVAL PORT-A-CATH;  Surgeon: Adin Hector, MD;  Location: [place] [facility];  Service: General;  Laterality: Right;  . TOTAL HIP ARTHROPLASTY Left 04/03/2017   Procedure: IRRIGATION AND DEBRIDIMENT LEFT HIP;  Surgeon: Rod Can, MD;  Location: [facility];  Service: Orthopedics;  Laterality: Left;   Social History   Tobacco Use  . Smoking status: Never Smoker  . Smokeless tobacco: Never Used  Substance Use Topics  . Alcohol use: No   family history includes Breast cancer in her paternal aunt; COPD in her father; Cancer in her brother; Dementia in her mother; Diabetes in her mother; Hypertension in her brother; Kidney disease in her mother.  Medications: Current Outpatient Medications  Medication Sig Dispense Refill  . alendronate (FOSAMAX) 70 MG tablet Take 1 tablet (70 mg total) by mouth once a week. Take with a full glass of water on an empty stomach. 5 tablet 11  . cholecalciferol (VITAMIN D) 1000 units tablet Take 2,000 Units by mouth daily.    . CYANOCOBALAMIN PO Take 1 tablet by mouth daily.    . diclofenac sodium (VOLTAREN) 1 % GEL Apply 4 g topically 4 (four) times daily. To affected joint. 100 g 11  . lisinopril (ZESTRIL) 5 MG tablet  Take 1 tablet (5 mg total) by mouth daily. 90 tablet 3  . Multiple Vitamin (MULTIVITAMIN) tablet Take 1 tablet by mouth daily.      . Pseudoeph-Doxylamine-DM-APAP (DAYQUIL/NYQUIL COLD/FLU RELIEF PO) Take by mouth.    . pyridoxine (B-6) 100 MG tablet Take 100 mg by mouth daily.    . rosuvastatin (CRESTOR) 20 MG tablet Take 1 tablet (20 mg total) by mouth 2 (two) times a week. 24 tablet 1  . senna-docusate (SENOKOT-S) 8.6-50 MG tablet Take 1 tablet by mouth at bedtime as needed for mild constipation. 30 tablet 0  . VIRTUSSIN A/C 100-10 MG/5ML syrup TAKE 5 ML BY MOUTH  EVERY 6 HOURS AS NEEDED FOR COUGH 120 mL 0   No current facility-administered medications for this visit.    No Known Allergies  Discussed warning signs or symptoms. Please see discharge instructions. Patient expresses understanding.  The above documentation has been reviewed and is accurate and complete Lynne Leader

## 2019-09-06 NOTE — Progress Notes (Signed)
X-ray knee shows mild to medium arthritis

## 2019-09-14 ENCOUNTER — Encounter (INDEPENDENT_AMBULATORY_CARE_PROVIDER_SITE_OTHER): Payer: Self-pay | Admitting: Otolaryngology

## 2019-09-14 ENCOUNTER — Ambulatory Visit (INDEPENDENT_AMBULATORY_CARE_PROVIDER_SITE_OTHER): Payer: Medicare HMO | Admitting: Otolaryngology

## 2019-09-14 ENCOUNTER — Other Ambulatory Visit: Payer: Self-pay

## 2019-09-14 VITALS — Temp 97.0°F

## 2019-09-14 DIAGNOSIS — M8500 Fibrous dysplasia (monostotic), unspecified site: Secondary | ICD-10-CM | POA: Diagnosis not present

## 2019-09-14 NOTE — Progress Notes (Signed)
HPI: Briana Smith is a 78 y.o. female who presents is referred by PCP for evaluation of chronic sore below the left eye.  She has had this problem for about 2 years where she has continuous palpation of the bone below the left eye in the region of the zygoma.  She underwent a CT scan that demonstrated an approximate 1 to 1/2 cm region of fibrous dysplasia within the zygoma on the left side.  Sinuses were clear.  Skin and subcutaneous tissue was normal.. Patient has had a history of breast cancer and was concerned about the sore region in the left cheek.  Past Medical History:  Diagnosis Date  . Breast cancer ([place])   . Cancer [facility])    breast cancer- left  . Heart murmur    years ago. no recent issues  . Hyperlipidemia   . Hypertension   . Osteoporosis 01/20/2014   (5) DEXA scan 08/31/2013 shows osteopenia with a T score of -2.0; zolendronate started April 2015, to be repeated every 6 months while on anastrozole.  2016 dexa stable. Repeat 2019  . Personal history of chemotherapy   . Personal history of radiation therapy    Past Surgical History:  Procedure Laterality Date  . ABDOMINAL HYSTERECTOMY  1987   fibroids  . BREAST BIOPSY    . BREAST LUMPECTOMY  10/23/10   left  . PORT-A-CATH REMOVAL  09/02/2011   Procedure: REMOVAL PORT-A-CATH;  Surgeon: Adin Hector, MD;  Location: [place];  Service: General;  Laterality: Right;  . TOTAL HIP ARTHROPLASTY Left 04/03/2017   Procedure: IRRIGATION AND DEBRIDIMENT LEFT HIP;  Surgeon: Rod Can, MD;  Location: [facility];  Service: Orthopedics;  Laterality: Left;   Social History   Socioeconomic History  . Marital status: Married    Spouse name: Not on file  . Number of children: Not on file  . Years of education: Not on file  . Highest education level: Not on file  Occupational History  . Not on file  Social Needs  . Financial resource strain: Not on file  . Food insecurity    Worry: Not on file    Inability:  Not on file  . Transportation needs    Medical: Not on file    Non-medical: Not on file  Tobacco Use  . Smoking status: Never Smoker  . Smokeless tobacco: Never Used  Substance and Sexual Activity  . Alcohol use: No  . Drug use: No  . Sexual activity: Not on file  Lifestyle  . Physical activity    Days per week: Not on file    Minutes per session: Not on file  . Stress: Not on file  Relationships  . Social Herbalist on phone: Not on file    Gets together: Not on file    Attends religious service: Not on file    Active member of club or organization: Not on file    Attends meetings of clubs or organizations: Not on file    Relationship status: Not on file  Other Topics Concern  . Not on file  Social History Narrative   Married. Lives with husband. 3 children. 3 grandkids (22, 22, 18 in 2018).       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       Hobbies: working puzzles, reading, shoping, beach   Family History  Problem Relation Age of Onset  . COPD Father   . Diabetes Mother   .  Dementia Mother   . Kidney disease Mother        not on dialysis  . Cancer Brother        jaw  . Hypertension Brother   . Breast cancer Paternal Aunt    No Known Allergies Prior to Admission medications   Medication Sig Start Date End Date Taking? Authorizing Provider  alendronate (FOSAMAX) 70 MG tablet Take 1 tablet (70 mg total) by mouth once a week. Take with a full glass of water on an empty stomach. 08/30/18  Yes [name], MD  cholecalciferol (VITAMIN D) 1000 units tablet Take 2,000 Units by mouth daily.   Yes [provider]  CYANOCOBALAMIN PO Take 1 tablet by mouth daily.   Yes [provider]  diclofenac sodium (VOLTAREN) 1 % GEL Apply 4 g topically 4 (four) times daily. To affected joint. 08/09/19  Yes Gregor Hams, MD  lisinopril (ZESTRIL) 5 MG tablet Take 1 tablet (5 mg total) by mouth daily. 07/28/19  Yes [name], MD   Multiple Vitamin (MULTIVITAMIN) tablet Take 1 tablet by mouth daily.     Yes [provider]  Pseudoeph-Doxylamine-DM-APAP (DAYQUIL/NYQUIL COLD/FLU RELIEF PO) Take by mouth.   Yes [provider]  pyridoxine (B-6) 100 MG tablet Take 100 mg by mouth daily.   Yes [provider]  rosuvastatin (CRESTOR) 20 MG tablet Take 1 tablet (20 mg total) by mouth 2 (two) times a week. 06/20/19  Yes [name], MD  senna-docusate (SENOKOT-S) 8.6-50 MG tablet Take 1 tablet by mouth at bedtime as needed for mild constipation. 04/06/17  Yes Florencia Reasons, MD  VIRTUSSIN A/C 100-10 MG/5ML syrup TAKE 5 ML BY MOUTH  EVERY 6 HOURS AS NEEDED FOR COUGH 12/22/18  Yes [name], MD     Positive ROS: Otherwise negative  All other systems have been reviewed and were otherwise negative with the exception of those mentioned in the HPI and as above.  Physical Exam: Constitutional: Alert, well-appearing, no acute distress Ears: External ears without lesions or tenderness. Ear canals are clear bilaterally with intact, clear TMs.  Nasal: External nose without lesions. Septum relatively midline.. Clear nasal passages.  Both middle meatus regions clear. Oral: Lips and gums without lesions. Tongue and palate mucosa without lesions. Posterior oropharynx clear. Neck: No palpable adenopathy or masses Respiratory: Breathing comfortably  Skin: No facial/neck lesions or rash noted.  Patient has a small palpable nodule in the left zygoma at the anterior attachment of the zygomatic arch. I reviewed the CT scan.  Procedures  Assessment: Fibrous dysplasia of the left zygoma  Plan: Reviewed with patient that this is a benign process. Would not recommend any intervention unless this significantly enlarges. She will follow-up as needed   Radene Journey, MD   CC:

## 2019-09-20 ENCOUNTER — Other Ambulatory Visit: Payer: Self-pay | Admitting: Family Medicine

## 2019-09-20 DIAGNOSIS — Z1231 Encounter for screening mammogram for malignant neoplasm of breast: Secondary | ICD-10-CM

## 2019-10-05 ENCOUNTER — Other Ambulatory Visit: Payer: Self-pay | Admitting: Family Medicine

## 2019-10-05 ENCOUNTER — Other Ambulatory Visit: Payer: Self-pay

## 2019-10-05 DIAGNOSIS — Z20828 Contact with and (suspected) exposure to other viral communicable diseases: Secondary | ICD-10-CM | POA: Diagnosis not present

## 2019-10-05 DIAGNOSIS — Z20822 Contact with and (suspected) exposure to covid-19: Secondary | ICD-10-CM

## 2019-10-07 LAB — NOVEL CORONAVIRUS, NAA: SARS-CoV-2, NAA: NOT DETECTED

## 2019-10-11 ENCOUNTER — Telehealth: Payer: Self-pay | Admitting: General Practice

## 2019-10-11 NOTE — Telephone Encounter (Signed)
Negative COVID results given. Patient results "NOT Detected." Caller expressed understanding. ° °

## 2019-10-20 ENCOUNTER — Other Ambulatory Visit: Payer: Self-pay | Admitting: Family Medicine

## 2019-10-20 NOTE — Telephone Encounter (Signed)
°  LAST APPOINTMENT DATE: @@LASTENCT @  NEXT APPOINTMENT DATE:@Visit  date not found  MEDICATION: Linisopril 10MG   PHARMACY: [facility], [place] [address]  **Let patient know to contact pharmacy at the end of the day to make sure medication is ready. **  ** Please notify patient to allow 48-72 hours to process**  **Encourage patient to contact the pharmacy for refills or they can request refills through [facility]**  CLINICAL FILLS OUT ALL BELOW:   LAST REFILL:  QTY:  REFILL DATE:    OTHER COMMENTS:    Okay for refill?  Please advise

## 2019-10-29 ENCOUNTER — Other Ambulatory Visit: Payer: Self-pay | Admitting: Nurse Practitioner

## 2019-11-09 ENCOUNTER — Ambulatory Visit
Admission: RE | Admit: 2019-11-09 | Discharge: 2019-11-09 | Disposition: A | Discharge status: Home / Self Care | Payer: Medicare HMO | Source: Ambulatory Visit | Attending: Family Medicine | Admitting: Family Medicine

## 2019-11-09 ENCOUNTER — Other Ambulatory Visit: Payer: Self-pay

## 2019-11-09 DIAGNOSIS — Z1231 Encounter for screening mammogram for malignant neoplasm of breast: Secondary | ICD-10-CM

## 2019-12-15 ENCOUNTER — Other Ambulatory Visit: Payer: Self-pay | Admitting: Family Medicine

## 2019-12-20 DIAGNOSIS — R69 Illness, unspecified: Secondary | ICD-10-CM | POA: Diagnosis not present

## 2019-12-22 ENCOUNTER — Other Ambulatory Visit: Payer: Self-pay

## 2019-12-22 ENCOUNTER — Encounter: Payer: Self-pay | Admitting: Family Medicine

## 2019-12-22 ENCOUNTER — Ambulatory Visit: Payer: Self-pay

## 2019-12-22 ENCOUNTER — Ambulatory Visit: Payer: Medicare HMO | Admitting: Family Medicine

## 2019-12-22 VITALS — BP 160/86 | HR 72 | Ht 61.0 in | Wt 179.8 lb

## 2019-12-22 DIAGNOSIS — G8929 Other chronic pain: Secondary | ICD-10-CM | POA: Diagnosis not present

## 2019-12-22 DIAGNOSIS — M25562 Pain in left knee: Secondary | ICD-10-CM | POA: Diagnosis not present

## 2019-12-22 NOTE — Progress Notes (Signed)
   I, Wendy Poet, LAT, ATC, am serving as scribe for Dr. Lynne Leader.  Briana Smith is a 79 y.o. female who presents to [place] at [facility] today for L knee pain.  She was last seen by Dr. Georgina Snell on 09/06/19 for her L knee and L Achille's.  She had a L knee injection on 08/09/19.  Since her last visit, pt reports that the L knee injection helped for approximately 3 months but has worsened recently.  Pt denies any L knee swelling.  Diagnostic imaging: L knee XR- 09/06/19  Pertinent review of systems: No fevers or chills  Relevant historical information: Hypertension and osteoporosis.  History fibrodysplasia involving facial bone.  History of breast cancer.   Exam:  BP (!) 160/86 (BP Location: Left Arm, Patient Position: Sitting, Cuff Size: Normal)   Pulse 72   Ht 5\' 1"  (1.549 m)   Wt 179 lb 12.8 oz (81.6 kg)   SpO2 96%   BMI 33.97 kg/m  General: Well Developed, well nourished, and in no acute distress.   MSK: Left knee: No significant effusion.  Mild valgus deformity. Mildly tender palpation medial and lateral joint line. Normal range of motion with crepitation. Stable ligamentous exam.    Lab and Radiology Results  Procedure: Real-time Ultrasound Guided Injection of left knee Device: Philips Affiniti 50G Images permanently stored and available for review in the ultrasound unit. Verbal informed consent obtained.  Discussed risks and benefits of procedure. Warned about infection bleeding damage to structures skin hypopigmentation and fat atrophy among others. Patient expresses understanding and agreement Time-out conducted.   Noted no overlying erythema, induration, or other signs of local infection.   Skin prepped in a sterile fashion.   Local anesthesia: Topical Ethyl chloride.   No significant joint effusion present With sterile technique and under real time ultrasound guidance:  40 mg of Kenalog and 3 mL of Marcaine injected easily.   Completed  without difficulty   Pain immediately resolved suggesting accurate placement of the medication.   Advised to call if fevers/chills, erythema, induration, drainage, or persistent bleeding.   Images permanently stored and available for review in the ultrasound unit.  Impression: Technically successful ultrasound guided injection.      Assessment and Plan: 79 y.o. female with left knee pain due to DJD.  Doubtful other significant cause.  Plan for repeat steroid injection today.  Last injection lasted a little under 3 months.  Discussed next steps if steroid injections do not provide lasting benefit.  Work on authorizing hyaluronic acid injections now so that we are prepared for her next step if steroid injection does not work well.  In the meantime hopeful that steroid injection will provide benefit.  Continue compression and Voltaren gel.   \ Orders Placed This Encounter  Procedures  . [place] LIMITED JOINT SPACE STRUCTURES LOW LEFT(NO LINKED CHARGES)    Order Specific Question:   Reason for Exam (SYMPTOM  OR DIAGNOSIS REQUIRED)    Answer:   L knee pain    Order Specific Question:   Preferred imaging location?    Answer:   [place]   No orders of the defined types were placed in this encounter.    Discussed warning signs or symptoms. Please see discharge instructions. Patient expresses understanding.   The above documentation has been reviewed and is accurate and complete Lynne Leader

## 2019-12-22 NOTE — Patient Instructions (Signed)
Thank you for coming in today. Call or go to the ER if you develop a large red swollen joint with extreme pain or oozing puss.  We will work to authorize the gel shots.   Sodium Hyaluronate intra-articular injection What is this medicine? SODIUM HYALURONATE (SOE dee um hye al yoor ON ate) is used to treat pain in the knee due to osteoarthritis. This medicine may be used for other purposes; ask your health care provider or pharmacist if you have questions. COMMON BRAND NAME(S): Amvisc, DUROLANE, Euflexxa, GELSYN-3, Hyalgan, Hymovis, Monovisc, Orthovisc, Supartz, Supartz FX, TriVisc, VISCO What should I tell my health care provider before I take this medicine? They need to know if you have any of these conditions:  bleeding disorders  glaucoma  infection in the knee joint  skin conditions or sensitivity  skin infection  an unusual allergic reaction to sodium hyaluronate, other medicines, foods, dyes, or preservatives. Different brands of sodium hyaluronate contain different allergens. Some may contain egg. Talk to your doctor about your allergies to make sure that you get the right product.  pregnant or trying to get pregnant  breast-feeding How should I use this medicine? This medicine is for injection into the knee joint. It is given by a health care professional in a hospital or clinic setting. Talk to your pediatrician regarding the use of this medicine in children. Special care may be needed. Overdosage: If you think you have taken too much of this medicine contact a poison control center or emergency room at once. NOTE: This medicine is only for you. Do not share this medicine with others. What if I miss a dose? This does not apply. What may interact with this medicine? Interactions are not expected. This list may not describe all possible interactions. Give your health care provider a list of all the medicines, herbs, non-prescription drugs, or dietary supplements you use.  Also tell them if you smoke, drink alcohol, or use illegal drugs. Some items may interact with your medicine. What should I watch for while using this medicine? Tell your doctor or healthcare professional if your symptoms do not start to get better or if they get worse. If receiving this medicine for osteoarthritis, limit your activity after you receive your injection. Avoid physical activity for 48 hours following your injection to keep your knee from swelling. Do not stand on your feet for more than 1 hour at a time during the first 48 hours following your injection. Ask your doctor or healthcare professional about when you can begin major physical activity again. What side effects may I notice from receiving this medicine? Side effects that you should report to your doctor or health care professional as soon as possible:  allergic reactions like skin rash, itching or hives, swelling of the face, lips, or tongue  dizziness  facial flushing  pain, tingling, numbness in the hands or feet  vision changes if received this medicine during eye surgery Side effects that usually do not require medical attention (report to your doctor or health care professional if they continue or are bothersome):  back pain  bruising at site where injected  chills  diarrhea  fever  headache  joint pain  joint stiffness  joint swelling  muscle cramps  muscle pain  nausea, vomiting  pain, redness, or irritation at site where injected  weak or tired This list may not describe all possible side effects. Call your doctor for medical advice about side effects. You may report side effects  to FDA at 1-800-FDA-1088. Where should I keep my medicine? This drug is given in a hospital or clinic and will not be stored at home. NOTE: This sheet is a summary. It may not cover all possible information. If you have questions about this medicine, talk to your doctor, pharmacist, or health care provider.   2020 Elsevier/Gold Standard (2015-10-25 08:34:51)

## 2019-12-26 ENCOUNTER — Telehealth: Payer: Self-pay

## 2019-12-26 NOTE — Telephone Encounter (Signed)
Called patient to schedule for gel injections, patient stated she will let us know if she needs them so far the steroid injection has helped, pain is not all the way gone but is better.

## 2019-12-29 DIAGNOSIS — H5201 Hypermetropia, right eye: Secondary | ICD-10-CM | POA: Diagnosis not present

## 2020-01-25 ENCOUNTER — Ambulatory Visit (INDEPENDENT_AMBULATORY_CARE_PROVIDER_SITE_OTHER): Payer: Medicare HMO | Admitting: Family Medicine

## 2020-01-25 ENCOUNTER — Other Ambulatory Visit: Payer: Self-pay | Admitting: Family Medicine

## 2020-01-25 ENCOUNTER — Other Ambulatory Visit: Payer: Self-pay

## 2020-01-25 ENCOUNTER — Encounter: Payer: Self-pay | Admitting: Family Medicine

## 2020-01-25 VITALS — BP 122/82 | HR 67 | Temp 98.2°F | Ht 61.0 in | Wt 176.6 lb

## 2020-01-25 DIAGNOSIS — D692 Other nonthrombocytopenic purpura: Secondary | ICD-10-CM | POA: Diagnosis not present

## 2020-01-25 DIAGNOSIS — E785 Hyperlipidemia, unspecified: Secondary | ICD-10-CM

## 2020-01-25 DIAGNOSIS — M810 Age-related osteoporosis without current pathological fracture: Secondary | ICD-10-CM | POA: Diagnosis not present

## 2020-01-25 DIAGNOSIS — R739 Hyperglycemia, unspecified: Secondary | ICD-10-CM

## 2020-01-25 DIAGNOSIS — I10 Essential (primary) hypertension: Secondary | ICD-10-CM

## 2020-01-25 LAB — HEMOGLOBIN A1C: Hgb A1c MFr Bld: 6 % (ref 4.6–6.5)

## 2020-01-25 LAB — BASIC METABOLIC PANEL
BUN: 20 mg/dL (ref 6–23)
CO2: 30 mEq/L (ref 19–32)
Calcium: 9.4 mg/dL (ref 8.4–10.5)
Chloride: 107 mEq/L (ref 96–112)
Creatinine, Ser: 0.86 mg/dL (ref 0.40–1.20)
GFR: 63.3 mL/min (ref >60.00)
Glucose, Bld: 101 mg/dL — ABNORMAL HIGH (ref 70–99)
Potassium: 5 mEq/L (ref 3.5–5.1)
Sodium: 143 mEq/L (ref 135–145)

## 2020-01-25 NOTE — Patient Instructions (Addendum)
Blood pressure is very well controlled even without lisinopril 5 mg.  Her home readings look excellent around 132/67 on average over the last 10.  She is going to continue to monitor at home and if average is above 140/90 she will let us know.  Continue without medication for now.  Try off rosuvastatin for now.  If this does not help your joint aches you can certainly restart this.  Need to focus on healthy eating/regular exercise/slight weight loss to try to prevent diabetes   Please stop by lab before you go If you do not have mychart- we will call you about results within 5 business days of [place] receiving them.  If you have mychart- we will send your results within 3 business days of [place] receiving them.  If abnormal or we want to clarify a result, we will call or mychart you to make sure you receive the message.  If you have questions or concerns or don't hear within 5 business days, please send [place] a message or call us.

## 2020-01-25 NOTE — Progress Notes (Signed)
Phone [phone] In person visit   Subjective:   Briana Smith is a 79 y.o. year old very pleasant female patient who presents for/with See problem oriented charting Chief Complaint  Patient presents with  . Follow-up  . Hypertension  . Hyperlipidemia   This visit occurred during the SARS-CoV-2 public health emergency.  Safety protocols were in place, including screening questions prior to the visit, additional usage of staff PPE, and extensive cleaning of exam room while observing appropriate contact time as indicated for disinfecting solutions.   Past Medical History-  Patient Active Problem List   Diagnosis Date Noted  . History of septic arthritis 07/26/2018    Priority: High  . Osteoporosis 01/20/2014    Priority: High  . History of breast cancer 06/30/2013    Priority: High  . Hyperlipidemia 02/02/2017    Priority: Medium  . Hyperglycemia 03/19/2015    Priority: Medium  . Essential hypertension 08/03/2008    Priority: Medium  . Cellulitis of left breast 03/28/2017    Priority: Low  . SKIN CANCER, HX OF 08/03/2008    Priority: Low  . Senile purpura ([place]) 01/25/2020    Medications- reviewed and updated Current Outpatient Medications  Medication Sig Dispense Refill  . alendronate (FOSAMAX) 70 MG tablet TAKE 1 TABLET BY MOUTH ONCE A WEEK WITH FULL GLASS OF WATER ON AN EMPTY STOMACH 4 tablet 0  . cholecalciferol (VITAMIN D) 1000 units tablet Take 2,000 Units by mouth daily.    . CYANOCOBALAMIN PO Take 1 tablet by mouth daily.    . diclofenac sodium (VOLTAREN) 1 % GEL Apply 4 g topically 4 (four) times daily. To affected joint. 100 g 11  . Multiple Vitamin (MULTIVITAMIN) tablet Take 1 tablet by mouth daily.      . Pseudoeph-Doxylamine-DM-APAP (DAYQUIL/NYQUIL COLD/FLU RELIEF PO) Take by mouth.    . pyridoxine (B-6) 100 MG tablet Take 100 mg by mouth daily.    Marland Kitchen senna-docusate (SENOKOT-S) 8.6-50 MG tablet Take 1 tablet by mouth at bedtime as needed for mild  constipation. 30 tablet 0   No current facility-administered medications for this visit.     Objective:  BP 122/82   Pulse 67   Temp 98.2 F (36.8 C)   Ht 5\' 1"  (1.549 m)   Wt 176 lb 9.6 oz (80.1 kg)   SpO2 98%   BMI 33.37 kg/m  Gen: NAD, resting comfortably CV: RRR no murmurs rubs or gallops Lungs: CTAB no crackles, wheeze, rhonchi Ext: no edema Skin: warm, dry, some spider veins on legs    Assessment and Plan   #social udpate- husband fell and hurt himself. Dealing with parkinsons. She is doing a lot of caretaking and placing a burden on her.   # Left knee pain- saw Dr. Georgina Snell in October. Has had 2 injections with last one a month ago. Plans for gel injections coming up.   #History of breast cancer 2012 -treated with lumpectomy and chemotherapy.  Completed 5 years of antiestrogen therapy 12/24/2015.  Continues yearly mammograms-last mammogram November 08, 2018 - 3D  #hypertension S: lisinopril 5 mg has not been taking- last visit we decreased from 10 to 5 mg due to great home control  Home #s average over last 10 readings  131.6 67.4   BP Readings from Last 3 Encounters:  01/25/20 122/82  12/22/19 (!) 160/86  09/06/19 140/80   A/P: Blood pressure is very well controlled even without lisinopril 5 mg.  Her home readings look excellent around 132/67 on  average over the last 10.  She is going to continue to monitor at home and if average is above 140/90 she will let us know.  Continue without medication for now.   # decreased GFR- reports not drinking much water.  Last kidney function slightly decreased under 60.  We will repeat this today. Sparing nsaids- doesn't do as well with nsaids   #hyperlipidemia S: compliant with rosuvastatin 20 mg twice a week. Pt would like to get off of this medication due to muscle aches and cramps Cautious about increasing due to prediabetes A/P: Last cholesterol numbers looked really good-significant improvement on rosuvastatin 20 mg twice a  week.  Patient wonders if her joint pains could be related to the statin medication-she would like to trial off over the next 6 months and see how she feels and then we will repeat cholesterol at her physical and we can decide if we want to restart the rosuvastatin  # Hyperglycemia/insulin resistance/prediabetes-peak A1c 6.2 S: Medication:  none  Exercise and diet- not exercising. Weight trending up with covid 19  Has seated peddler Lab Results  Component Value Date   HGBA1C 6.2 07/28/2019   HGBA1C 6.1 07/26/2018   HGBA1C 6.1 07/02/2017   A/P: Update A1c today.  Hopefully remains controlled-I encouraged regular exercise and healthy eating-I would love if we could even see 3 to 5 pound weight loss by follow-up-this has been a challenging time with COVID-19 but I know she can make progress on this!   #Osteoporosis S: Compliant with Fosamax weekly .  She had been on Reclast in the past when on anastrozole.  Last bone density with worst T score -2.7 in October 2019.  Patient is compliant with calcium and vitamin D  A/P: Hopefully osteoporosis is stable.  We will recheck her bone density at the next visit-she feels like she has had more aches and pains and worries that the left jaw issue she has had could be related to the Fosamax or prior Reclast-depending on results we discussed possibly trying off Fosamax at follow-up.  Recommended follow up: Return in about 6 months (around 07/26/2020) for physical or sooner if needed.  Lab/Order associations:   ICD-10-CM   1. Essential hypertension  I10   2. Hyperlipidemia, unspecified hyperlipidemia type  E78.5   3. Age-related osteoporosis without current pathological fracture  M81.0   4. Hyperglycemia  R73.9   5. Senile purpura ([place])  D69.2    Return precautions advised.  Garret Reddish, MD

## 2020-01-25 NOTE — Progress Notes (Signed)
Notes from Dr. Algie Coffer are at risk for diabetes with hemoglobin a1c of 6.0 down from 6.2 (at risk from 5.7-6.4). Healthy eating, regular exercise, weight loss advised.   Kidneys have improved from last visit!  Which is great news

## 2020-01-25 NOTE — Assessment & Plan Note (Signed)
Patient has noted easy bruising and bleeding.  We discussed diagnosis of senile purpura.  She is not on any medications but increased risk for this.  We will continue to monitor.  Her blood counts and platelets have been fine in the past. Lab Results  Component Value Date   WBC 6.1 07/28/2019   HGB 13.1 07/28/2019   HCT 40.0 07/28/2019   MCV 93.8 07/28/2019   PLT 153.0 07/28/2019

## 2020-01-26 ENCOUNTER — Ambulatory Visit: Payer: Medicare HMO | Admitting: Family Medicine

## 2020-02-07 DIAGNOSIS — Z1283 Encounter for screening for malignant neoplasm of skin: Secondary | ICD-10-CM | POA: Diagnosis not present

## 2020-02-07 DIAGNOSIS — L570 Actinic keratosis: Secondary | ICD-10-CM | POA: Diagnosis not present

## 2020-02-07 DIAGNOSIS — X32XXXD Exposure to sunlight, subsequent encounter: Secondary | ICD-10-CM | POA: Diagnosis not present

## 2020-02-07 DIAGNOSIS — C44319 Basal cell carcinoma of skin of other parts of face: Secondary | ICD-10-CM | POA: Diagnosis not present

## 2020-02-07 DIAGNOSIS — D0439 Carcinoma in situ of skin of other parts of face: Secondary | ICD-10-CM | POA: Diagnosis not present

## 2020-02-07 DIAGNOSIS — L821 Other seborrheic keratosis: Secondary | ICD-10-CM | POA: Diagnosis not present

## 2020-02-18 DIAGNOSIS — Z20822 Contact with and (suspected) exposure to covid-19: Secondary | ICD-10-CM | POA: Diagnosis not present

## 2020-02-29 ENCOUNTER — Other Ambulatory Visit: Payer: Self-pay | Admitting: Family Medicine

## 2020-03-06 DIAGNOSIS — Z85828 Personal history of other malignant neoplasm of skin: Secondary | ICD-10-CM | POA: Diagnosis not present

## 2020-03-06 DIAGNOSIS — R17 Unspecified jaundice: Secondary | ICD-10-CM | POA: Diagnosis not present

## 2020-03-06 DIAGNOSIS — Z08 Encounter for follow-up examination after completed treatment for malignant neoplasm: Secondary | ICD-10-CM | POA: Diagnosis not present

## 2020-03-06 DIAGNOSIS — C44319 Basal cell carcinoma of skin of other parts of face: Secondary | ICD-10-CM | POA: Diagnosis not present

## 2020-03-06 DIAGNOSIS — S80862A Insect bite (nonvenomous), left lower leg, initial encounter: Secondary | ICD-10-CM | POA: Diagnosis not present

## 2020-03-06 DIAGNOSIS — L298 Other pruritus: Secondary | ICD-10-CM | POA: Diagnosis not present

## 2020-03-07 DIAGNOSIS — H1013 Acute atopic conjunctivitis, bilateral: Secondary | ICD-10-CM | POA: Diagnosis not present

## 2020-04-02 ENCOUNTER — Other Ambulatory Visit: Payer: Self-pay | Admitting: Family Medicine

## 2020-04-03 DIAGNOSIS — Z85828 Personal history of other malignant neoplasm of skin: Secondary | ICD-10-CM | POA: Diagnosis not present

## 2020-04-03 DIAGNOSIS — L570 Actinic keratosis: Secondary | ICD-10-CM | POA: Diagnosis not present

## 2020-04-03 DIAGNOSIS — L821 Other seborrheic keratosis: Secondary | ICD-10-CM | POA: Diagnosis not present

## 2020-04-03 DIAGNOSIS — X32XXXD Exposure to sunlight, subsequent encounter: Secondary | ICD-10-CM | POA: Diagnosis not present

## 2020-04-03 DIAGNOSIS — L814 Other melanin hyperpigmentation: Secondary | ICD-10-CM | POA: Diagnosis not present

## 2020-04-03 DIAGNOSIS — Z08 Encounter for follow-up examination after completed treatment for malignant neoplasm: Secondary | ICD-10-CM | POA: Diagnosis not present

## 2020-04-03 DIAGNOSIS — C44622 Squamous cell carcinoma of skin of right upper limb, including shoulder: Secondary | ICD-10-CM | POA: Diagnosis not present

## 2020-05-02 ENCOUNTER — Other Ambulatory Visit: Payer: Self-pay | Admitting: Family Medicine

## 2020-05-04 ENCOUNTER — Telehealth: Payer: Self-pay | Admitting: Family Medicine

## 2020-05-04 DIAGNOSIS — Z08 Encounter for follow-up examination after completed treatment for malignant neoplasm: Secondary | ICD-10-CM | POA: Diagnosis not present

## 2020-05-04 DIAGNOSIS — Z85828 Personal history of other malignant neoplasm of skin: Secondary | ICD-10-CM | POA: Diagnosis not present

## 2020-05-04 DIAGNOSIS — D0461 Carcinoma in situ of skin of right upper limb, including shoulder: Secondary | ICD-10-CM | POA: Diagnosis not present

## 2020-05-04 NOTE — Progress Notes (Signed)
  Chronic Care Management   Note  05/04/2020 Name: Briana Smith MRN: [id number] DOB: 21-Sep-1941  Briana Smith is a 79 y.o. year old female who is a primary care patient of Yong Channel, [name], MD. I reached out to Verdie Drown by phone today in response to a referral sent by Briana Smith's PCP, Yong Channel, [name], MD.   Ms. Rookstool was given information about Chronic Care Management services today including:  1. CCM service includes personalized support from designated clinical staff supervised by her physician, including individualized plan of care and coordination with other care providers 2. 24/7 contact phone numbers for assistance for urgent and routine care needs. 3. Service will only be billed when office clinical staff spend 20 minutes or more in a month to coordinate care. 4. Only one practitioner may furnish and bill the service in a calendar month. 5. The patient may stop CCM services at any time (effective at the end of the month) by phone call to the office staff.   Patient agreed to services and verbal consent obtained.   Follow up plan:   Earney Hamburg Upstream Scheduler

## 2020-05-08 ENCOUNTER — Telehealth: Payer: Self-pay | Admitting: Family Medicine

## 2020-05-08 NOTE — Telephone Encounter (Signed)
Left message for patient to call back and schedule Medicare Annual Wellness Visit (AWV) either virtually/audio only OR in office. Whatever the patients preference is.  Last AWV 05/10/19; please schedule 05/10/20 OR AFTER with [facility]Nurse Health Advisor at [facility].  This should be a 45 minute visit.

## 2020-05-21 ENCOUNTER — Other Ambulatory Visit: Payer: Self-pay

## 2020-05-21 ENCOUNTER — Ambulatory Visit: Payer: Medicare HMO

## 2020-05-21 DIAGNOSIS — M810 Age-related osteoporosis without current pathological fracture: Secondary | ICD-10-CM

## 2020-05-21 DIAGNOSIS — I10 Essential (primary) hypertension: Secondary | ICD-10-CM

## 2020-05-21 DIAGNOSIS — E785 Hyperlipidemia, unspecified: Secondary | ICD-10-CM

## 2020-05-21 NOTE — Progress Notes (Signed)
Chronic Care Management Pharmacy  Name: Briana Smith  MRN: [id number] DOB: March 12, 1941  Chief Complaint/ HPI  Briana Smith,  79 y.o. , female presents for their Initial CCM visit with the clinical pharmacist via telephone due to COVID-19 Pandemic.  Testing BP at home, recent readings 120s-130s/70s. States water intake has improved, is frustrated with lack of progress with exercise and weight watchers diet. Reports she is inconsistent diet/exercise plans and asks about medications that might be available to help.  PCP : [name], MD  Chronic conditions include:  Encounter Diagnoses  Name Primary?  . Hyperlipidemia, unspecified hyperlipidemia type Yes  . Age-related osteoporosis without current pathological fracture   . Essential hypertension     Office Visits:  01/25/2020 (PCP): pt had stopped lisinopril 5 mg  Patient Active Problem List   Diagnosis Date Noted  . Senile purpura ([place]) 01/25/2020  . History of septic arthritis 07/26/2018  . Cellulitis of left breast 03/28/2017  . Hyperlipidemia 02/02/2017  . Hyperglycemia 03/19/2015  . Osteoporosis 01/20/2014  . History of breast cancer 06/30/2013  . Essential hypertension 08/03/2008  . SKIN CANCER, HX OF 08/03/2008   Past Surgical History:  Procedure Laterality Date  . ABDOMINAL HYSTERECTOMY  1987   fibroids  . BREAST BIOPSY    . BREAST LUMPECTOMY  10/23/10   left  . PORT-A-CATH REMOVAL  09/02/2011   Procedure: REMOVAL PORT-A-CATH;  Surgeon: Adin Hector, MD;  Location: [facility];  Service: General;  Laterality: Right;  . TOTAL HIP ARTHROPLASTY Left 04/03/2017   Procedure: IRRIGATION AND DEBRIDIMENT LEFT HIP;  Surgeon: Rod Can, MD;  Location: [facility];  Service: Orthopedics;  Laterality: Left;   Social History   Socioeconomic History  . Marital status: Married    Spouse name: Not on file  . Number of children: Not on file  . Years of education: Not on file  . Highest  education level: Not on file  Occupational History  . Not on file  Tobacco Use  . Smoking status: Never Smoker  . Smokeless tobacco: Never Used  Substance and Sexual Activity  . Alcohol use: No  . Drug use: No  . Sexual activity: Not on file  Other Topics Concern  . Not on file  Social History Narrative   Married. Lives with husband. 3 children. 3 grandkids (22, 22, 18 in 2018).       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       Hobbies: working puzzles, reading, shoping, beach   Social Determinants of Radio broadcast assistant Strain:   . Difficulty of Paying Living Expenses:   Food Insecurity:   . Worried About Charity fundraiser in the Last Year:   . Arboriculturist in the Last Year:   Transportation Needs:   . Film/video editor (Medical):   Marland Kitchen Lack of Transportation (Non-Medical):   Physical Activity: Insufficiently Active  . Days of Exercise per Week: 3 days  . Minutes of Exercise per Session: 30 min  Stress:   . Feeling of Stress :   Social Connections:   . Frequency of Communication with Friends and Family:   . Frequency of Social Gatherings with Friends and Family:   . Attends Religious Services:   . Active Member of Clubs or Organizations:   . Attends Archivist Meetings:   Marland Kitchen Marital Status:   SDOH (Social Determinants of Health) assessments performed: Yes. Family History  Problem  Relation Age of Onset  . COPD Father   . Diabetes Mother   . Dementia Mother   . Kidney disease Mother        not on dialysis  . Cancer Brother        jaw  . Hypertension Brother   . Breast cancer Paternal Aunt    No Known Allergies Outpatient Encounter Medications as of 05/21/2020  Medication Sig  . alendronate (FOSAMAX) 70 MG tablet TAKE 1 TABLET BY MOUTH ONCE A WEEK ON AN EMPTY STOMACH WITH  FULL  GLASS  OF  WATER  . cholecalciferol (VITAMIN D) 1000 units tablet Take 2,000 Units by mouth daily.  . diclofenac sodium (VOLTAREN) 1 % GEL Apply 4  g topically 4 (four) times daily. To affected joint.  . Multiple Vitamin (MULTIVITAMIN) tablet Take 1 tablet by mouth daily.    . CYANOCOBALAMIN PO Take 1 tablet by mouth daily. (Patient not taking: Reported on 05/21/2020)  . Pseudoeph-Doxylamine-DM-APAP (DAYQUIL/NYQUIL COLD/FLU RELIEF PO) Take by mouth. (Patient not taking: Reported on 05/21/2020)  . pyridoxine (B-6) 100 MG tablet Take 100 mg by mouth daily. (Patient not taking: Reported on 05/21/2020)  . senna-docusate (SENOKOT-S) 8.6-50 MG tablet Take 1 tablet by mouth at bedtime as needed for mild constipation. (Patient not taking: Reported on 05/21/2020)   No facility-administered encounter medications on file as of 05/21/2020.   Patient Care Team    Relationship Specialty Notifications Start End  [name], MD PCP - General Family Medicine  02/02/17   [name], MD Consulting Physician Dermatology  08/30/18   [name], [facility] Pharmacist Pharmacist  05/04/20    Comment: [phone]   Current Diagnosis/Assessment: Goals Addressed            This Visit's Progress   . PharmD Care Plan       CARE PLAN ENTRY (see longitudinal plan of care for additional care plan information)  Current Barriers:  . Chronic Disease Management support, education, and care coordination needs related to Hypertension, Hyperlipidemia, and Osteoporosis   Hypertension BP Readings from Last 3 Encounters:  01/25/20 122/82  12/22/19 (!) 160/86  09/06/19 140/80   . Pharmacist Clinical Goal(s): o Over the next 180 days, patient will work with PharmD and providers to maintain BP goal <130/80 . Current regimen:  o No medications at this time . Interventions: o Diet/exercise recommendations o [facility] through [employer] . Patient self care activities - Over the next 180 days, patient will: o Check BP once every week, document, and provide at future appointments o Ensure daily salt intake < 2300 mg/day  Hyperlipidemia Lab Results  Component  Value Date/Time   LDLCALC 87 07/28/2019 09:27 AM   LDLDIRECT 141.7 11/08/2013 08:15 AM   . Pharmacist Clinical Goal(s): o Over the next 180 days, patient will work with PharmD and providers to maintain LDL goal < 100 . Current regimen:  o No medications at this time, previously on Crestor twice weekly . Interventions: o Diet/exercise recommendations . Patient self care activities - Over the next 180 days, patient will: o Maintain food log over next week  Pre-diabetes Lab Results  Component Value Date/Time   HGBA1C 6.0 01/25/2020 08:59 AM   HGBA1C 6.2 07/28/2019 09:27 AM   . Pharmacist Clinical Goal(s): o Over the next 180 days, patient will work with PharmD and providers to maintain A1c goal <6.5% . Current regimen:  o Diet/exercise alone . Interventions: o Diet/exercise recommendations . Patient self care activities - Over the next  180 days, patient will: o Check blood sugar as directed, document, and provide at future appointments o Contact provider with any episodes of hypoglycemia  Osteoporosis . Pharmacist Clinical Goal(s) o Over the next 180 days, patient will work with PharmD and providers to ensure safe and effective therapy for maintaining bone mass  . Current regimen:  o Calcium/vitamin D3 supplementation o Alendronate 70 mg once weekly . Interventions: o Diet/exercise recommendations . Patient self care activities - Over the next 180 days, patient will: o Continue current management  Medication management . Pharmacist Clinical Goal(s): o Over the next 180 days, patient will work with PharmD and providers to maintain optimal medication adherence . Current pharmacy: [facility] . Interventions o Comprehensive medication review performed. o Continue current medication management strategy . Patient self care activities - Over the next 180 days, patient will: o Focus on medication adherence by Continue current management o Report any questions or concerns to PharmD  and/or provider(s) Initial goal documentation.      Hypertension   BP goal <130/80  BP Readings from Last 3 Encounters:  01/25/20 122/82  12/22/19 (!) 160/86  09/06/19 140/80   Patient reports normal readings at home. Checking once every 1-2 weeks. Previous on low dose lisinopril and stopped on own due to lower readings at home. Patient is currently controlled on the following medications:  . No medications at this time  We discussed diet and exercise extensively.  Plan  Continue control with diet and exercise.   Hyperlipidemia   LDL goal < 100  Lipid Panel     Component Value Date/Time   CHOL 164 07/28/2019 0927   TRIG 71.0 07/28/2019 0927   HDL 62.40 07/28/2019 0927   LDLCALC 87 07/28/2019 0927   LDLDIRECT 141.7 11/08/2013 0815    Hepatic Function Latest Ref Rng & Units 07/28/2019 07/26/2018 07/02/2017  Total Protein 6.0 - 8.3 g/dL 7.0 7.4 7.2  Albumin 3.5 - 5.2 g/dL 4.5 4.4 4.3  AST 0 - 37 U/L _0 ALT 0 - 35 U/L _1 Alk Phosphatase 39 - 117 U/L 42 46 56  Total Bilirubin 0.2 - 1.2 mg/dL 0.5 0.4 0.5  Bilirubin, Direct 0.0 - 0.3 mg/dL - - -    The ASCVD Risk score (Glynn., et al., 2013) failed to calculate for the following reasons:   The 2013 ASCVD risk score is only valid for ages 71 to 71   Previously on rosuvastatin. Patient is currently controlled on the following medications:  . No medications at this time  We discussed:  diet and exercise extensively.  Plan  Continue control with diet and exercise. 6 month labs 07/2020 visit  Osteoporosis   Last DEXA Scan: 2019. Osteoporosis.  High risk.  Patient is currently controlled on the following medications:  . Alendronate 70 mg once weekly  We discussed:  Recommend [phone] units of vitamin D daily. Recommend 1200 mg of calcium daily from dietary and supplemental sources. Counseled on oral bisphosphonate administration: take in the morning, 30 minutes prior to food with 6-8 oz of water.  Do not lie down for at least 30 minutes after taking. Recommend weight-bearing and muscle strengthening exercises for building and maintaining bone density.  Plan  Continue current medications and control with diet and exercise.  Vaccines   Immunization History  Administered Date(s) Administered  . Fluad Quad(high Dose 65+) 07/28/2019  . Influenza Split 07/10/2011, 07/29/2012, 08/04/2013  . Influenza Whole 07/18/2009  . Influenza, High Dose  Seasonal PF 08/15/2015, 07/02/2017, 07/26/2018  . Influenza-Unspecified 07/31/2014  . PFIZER SARS-COV-2 Vaccination 10/27/2019, 11/17/2019  . Pneumococcal Conjugate-13 11/15/2013  . Pneumococcal Polysaccharide-23 11/18/2005  . Td 08/02/2002  . Tdap 10/20/2012  . Zoster 02/21/2009  . Zoster Recombinat (Shingrix) 07/26/2018, 09/30/2018   Reviewed and discussed patient's vaccination history.   Up to date, next vaccine is tdap 2024.  Plan  N/a - no recommendations.  Medication Management Coordination   Receives prescription medications from:  [place], [place] - [address] [place] [place] [address] Phone: [phone] Fax: [phone]   Denies any issues with current medication management.   Plan  Continue current medication management strategy. ___________________________  Visit follow-up:  . CPA follow-up:1 week to obtain food log as discussed in conversation about diet/exercise, ensure she has been able to start going to the [facility] . RPH follow-up:  1-week review log and routine exercise plan as step 1 in weight loss, 6 month telephone visit (any time)  [name], Pharm.D., BCGP Clinical Pharmacist [place] [phone]

## 2020-05-21 NOTE — Patient Instructions (Signed)
Please call me at [phone] (direct line) with any questions - thank you!  - Edyth Gunnels., Clinical Pharmacist  Goals Addressed            This Visit's Progress   . PharmD Care Plan       CARE PLAN ENTRY (see longitudinal plan of care for additional care plan information)  Current Barriers:  . Chronic Disease Management support, education, and care coordination needs related to Hypertension, Hyperlipidemia, and Osteoporosis   Hypertension BP Readings from Last 3 Encounters:  01/25/20 122/82  12/22/19 (!) 160/86  09/06/19 140/80   . Pharmacist Clinical Goal(s): o Over the next 180 days, patient will work with PharmD and providers to maintain BP goal <130/80 . Current regimen:  o No medications at this time . Interventions: o Diet/exercise recommendations o [facility] through [employer] . Patient self care activities - Over the next 180 days, patient will: o Check BP once every week, document, and provide at future appointments o Ensure daily salt intake < 2300 mg/day  Hyperlipidemia Lab Results  Component Value Date/Time   LDLCALC 87 07/28/2019 09:27 AM   LDLDIRECT 141.7 11/08/2013 08:15 AM   . Pharmacist Clinical Goal(s): o Over the next 180 days, patient will work with PharmD and providers to maintain LDL goal < 100 . Current regimen:  o No medications at this time, previously on Crestor twice weekly . Interventions: o Diet/exercise recommendations . Patient self care activities - Over the next 180 days, patient will: o Maintain food log over next week  Pre-diabetes Lab Results  Component Value Date/Time   HGBA1C 6.0 01/25/2020 08:59 AM   HGBA1C 6.2 07/28/2019 09:27 AM   . Pharmacist Clinical Goal(s): o Over the next 180 days, patient will work with PharmD and providers to maintain A1c goal <6.5% . Current regimen:  o Diet/exercise alone . Interventions: o Diet/exercise recommendations . Patient self care activities - Over the next 180 days, patient  will: o Check blood sugar as directed, document, and provide at future appointments o Contact provider with any episodes of hypoglycemia  Osteoporosis . Pharmacist Clinical Goal(s) o Over the next 180 days, patient will work with PharmD and providers to ensure safe and effective therapy for maintaining bone mass  . Current regimen:  o Calcium/vitamin D3 supplementation o Alendronate 70 mg once weekly . Interventions: o Diet/exercise recommendations . Patient self care activities - Over the next 180 days, patient will: o Continue current management  Medication management . Pharmacist Clinical Goal(s): o Over the next 180 days, patient will work with PharmD and providers to maintain optimal medication adherence . Current pharmacy: [facility] . Interventions o Comprehensive medication review performed. o Continue current medication management strategy . Patient self care activities - Over the next 180 days, patient will: o Focus on medication adherence by Continue current management o Report any questions or concerns to PharmD and/or provider(s) Initial goal documentation.      Ms. Ladouceur was given information about Chronic Care Management services today including:  1. CCM service includes personalized support from designated clinical staff supervised by her physician, including individualized plan of care and coordination with other care providers 2. 24/7 contact phone numbers for assistance for urgent and routine care needs. 3. Standard insurance, coinsurance, copays and deductibles apply for chronic care management only during months in which we provide at least 20 minutes of these services. Most insurances cover these services at 100%, however patients may be responsible for any copay, coinsurance and/or deductible if  applicable. This service may help you avoid the need for more expensive face-to-face services. 4. Only one practitioner may furnish and bill the service in a calendar  month. 5. The patient may stop CCM services at any time (effective at the end of the month) by phone call to the office staff.  Patient agreed to services and verbal consent obtained.   The patient verbalized understanding of instructions provided today and agreed to receive a mailed copy of patient instruction and/or educational materials. Telephone follow up appointment with pharmacy team member scheduled for: See next appointment with "Care Management Staff" under "What's Next" below.   [name], Pharm.D., BCGP Clinical Pharmacist @[facility] @ [phone]    Exercising to Lose Weight Exercise is structured, repetitive physical activity to improve fitness and health. Getting regular exercise is important for everyone. It is especially important if you are overweight. Being overweight increases your risk of heart disease, stroke, diabetes, high blood pressure, and several types of cancer. Reducing your calorie intake and exercising can help you lose weight. Exercise is usually categorized as moderate or vigorous intensity. To lose weight, most people need to do a certain amount of moderate-intensity or vigorous-intensity exercise each week. Moderate-intensity exercise  Moderate-intensity exercise is any activity that gets you moving enough to burn at least three times more energy (calories) than if you were sitting. Examples of moderate exercise include:  Walking a mile in 15 minutes.  Doing light yard work.  Biking at an easy pace. Most people should get at least 150 minutes (2 hours and 30 minutes) a week of moderate-intensity exercise to maintain their body weight. Vigorous-intensity exercise Vigorous-intensity exercise is any activity that gets you moving enough to burn at least six times more calories than if you were sitting. When you exercise at this intensity, you should be working hard enough that you are not able to carry on a conversation. Examples of vigorous exercise  include:  Running.  Playing a team sport, such as football, basketball, and soccer.  Jumping rope. Most people should get at least 75 minutes (1 hour and 15 minutes) a week of vigorous-intensity exercise to maintain their body weight. How can exercise affect me? When you exercise enough to burn more calories than you eat, you lose weight. Exercise also reduces body fat and builds muscle. The more muscle you have, the more calories you burn. Exercise also:  Improves mood.  Reduces stress and tension.  Improves your overall fitness, flexibility, and endurance.  Increases bone strength. The amount of exercise you need to lose weight depends on:  Your age.  The type of exercise.  Any health conditions you have.  Your overall physical ability. Talk to your health care provider about how much exercise you need and what types of activities are safe for you. What actions can I take to lose weight? Nutrition   Make changes to your diet as told by your health care provider or diet and nutrition specialist (dietitian). This may include: ? Eating fewer calories. ? Eating more protein. ? Eating less unhealthy fats. ? Eating a diet that includes fresh fruits and vegetables, whole grains, low-fat dairy products, and lean protein. ? Avoiding foods with added fat, salt, and sugar.  Drink plenty of water while you exercise to prevent dehydration or heat stroke. Activity  Choose an activity that you enjoy and set realistic goals. Your health care provider can help you make an exercise plan that works for you.  Exercise at a moderate or  vigorous intensity most days of the week. ? The intensity of exercise may vary from person to person. You can tell how intense a workout is for you by paying attention to your breathing and heartbeat. Most people will notice their breathing and heartbeat get faster with more intense exercise.  Do resistance training twice each week, such  as: ? Push-ups. ? Sit-ups. ? Lifting weights. ? Using resistance bands.  Getting short amounts of exercise can be just as helpful as long structured periods of exercise. If you have trouble finding time to exercise, try to include exercise in your daily routine. ? Get up, stretch, and walk around every 30 minutes throughout the day. ? Go for a walk during your lunch break. ? Park your car farther away from your destination. ? If you take public transportation, get off one stop early and walk the rest of the way. ? Make phone calls while standing up and walking around. ? Take the stairs instead of elevators or escalators.  Wear comfortable clothes and shoes with good support.  Do not exercise so much that you hurt yourself, feel dizzy, or get very short of breath. Where to find more information  U.S. Department of Health and Human Services: [employer]at  Centers for Disease Control and Prevention (CDC): http://www.wolf.info/ Contact a health care provider:  Before starting a new exercise program.  If you have questions or concerns about your weight.  If you have a medical problem that keeps you from exercising. Get help right away if you have any of the following while exercising:  Injury.  Dizziness.  Difficulty breathing or shortness of breath that does not go away when you stop exercising.  Chest pain.  Rapid heartbeat. Summary  Being overweight increases your risk of heart disease, stroke, diabetes, high blood pressure, and several types of cancer.  Losing weight happens when you burn more calories than you eat.  Reducing the amount of calories you eat in addition to getting regular moderate or vigorous exercise each week helps you lose weight. This information is not intended to replace advice given to you by your health care provider. Make sure you discuss any questions you have with your health care provider. Document Revised: 10/05/2017 Document Reviewed:  10/05/2017 Elsevier Patient Education  2020 [employer].

## 2020-06-04 ENCOUNTER — Other Ambulatory Visit: Payer: Self-pay | Admitting: Family Medicine

## 2020-06-12 DIAGNOSIS — L570 Actinic keratosis: Secondary | ICD-10-CM | POA: Diagnosis not present

## 2020-06-12 DIAGNOSIS — X32XXXD Exposure to sunlight, subsequent encounter: Secondary | ICD-10-CM | POA: Diagnosis not present

## 2020-06-12 DIAGNOSIS — Z85828 Personal history of other malignant neoplasm of skin: Secondary | ICD-10-CM | POA: Diagnosis not present

## 2020-06-12 DIAGNOSIS — Z08 Encounter for follow-up examination after completed treatment for malignant neoplasm: Secondary | ICD-10-CM | POA: Diagnosis not present

## 2020-06-21 ENCOUNTER — Telehealth: Payer: Self-pay

## 2020-06-21 NOTE — Progress Notes (Addendum)
Chronic Care Management Pharmacy Assistant   Name: Briana Smith  MRN: [id number] DOB: [birth year]  Reason for Encounter: Disease State    PCP : [name], MD  Allergies:  No Known Allergies  Medications: Outpatient Encounter Medications as of 06/21/2020  Medication Sig   alendronate (FOSAMAX) 70 MG tablet TAKE 1 TABLET BY MOUTH ONCE A WEEK ON AN EMPTY STOMACH WITH FULL GLASS OF WATER   cholecalciferol (VITAMIN D) 1000 units tablet Take 2,000 Units by mouth daily.   CYANOCOBALAMIN PO Take 1 tablet by mouth daily. (Patient not taking: Reported on 05/21/2020)   diclofenac sodium (VOLTAREN) 1 % GEL Apply 4 g topically 4 (four) times daily. To affected joint.   Multiple Vitamin (MULTIVITAMIN) tablet Take 1 tablet by mouth daily.     Pseudoeph-Doxylamine-DM-APAP (DAYQUIL/NYQUIL COLD/FLU RELIEF PO) Take by mouth. (Patient not taking: Reported on 05/21/2020)   pyridoxine (B-6) 100 MG tablet Take 100 mg by mouth daily. (Patient not taking: Reported on 05/21/2020)   senna-docusate (SENOKOT-S) 8.6-50 MG tablet Take 1 tablet by mouth at bedtime as needed for mild constipation. (Patient not taking: Reported on 05/21/2020)   No facility-administered encounter medications on file as of 06/21/2020.    Current Diagnosis: Patient Active Problem List   Diagnosis Date Noted   Senile purpura ([place]) 01/25/2020   History of septic arthritis 07/26/2018   Cellulitis of left breast 03/28/2017   Hyperlipidemia 02/02/2017   Hyperglycemia 03/19/2015   Osteoporosis 01/20/2014   History of breast cancer 06/30/2013   Essential hypertension 08/03/2008   SKIN CANCER, HX OF 08/03/2008    Reviewed chart prior to disease state call. Spoke with patient regarding BP  Recent Office Vitals: BP Readings from Last 3 Encounters:  01/25/20 122/82  12/22/19 (!) 160/86  09/06/19 140/80   Pulse Readings from Last 3 Encounters:  01/25/20 67  12/22/19 72  09/06/19 71    Wt Readings from Last 3  Encounters:  01/25/20 176 lb 9.6 oz (80.1 kg)  12/22/19 179 lb 12.8 oz (81.6 kg)  09/06/19 171 lb 12.8 oz (77.9 kg)     Kidney Function Lab Results  Component Value Date/Time   CREATININE 0.86 01/25/2020 08:59 AM   CREATININE 0.96 07/28/2019 09:27 AM   CREATININE 0.9 01/31/2016 10:02 AM   CREATININE 0.9 07/18/2015 10:27 AM   GFR 63.30 01/25/2020 08:59 AM   GFRNONAA >60 04/06/2017 03:29 AM   GFRAA >60 04/06/2017 03:29 AM    BMP Latest Ref Rng & Units 01/25/2020 07/28/2019 07/26/2018  Glucose 70 - 99 mg/dL 101(H) 109(H) 105(H)  BUN 6 - 23 mg/dL 20 19 19   Creatinine 0.40 - 1.20 mg/dL 0.86 0.96 0.83  Sodium 135 - 145 mEq/L 143 141 138  Potassium 3.5 - 5.1 mEq/L 5.0 4.9 4.4  Chloride 96 - 112 mEq/L 107 106 103  CO2 19 - 32 mEq/L 30 28 29   Calcium 8.4 - 10.5 mg/dL 9.4 9.6 9.5    Current antihypertensive regimen:  None How often are you checking your Blood Pressure?  Occasionally Current home BP readings: 112/56 What recent interventions/DTPs have been made by any provider to improve Blood Pressure: 01/25/2020 OV [facility] [name])-Instructed to continue without medication per provider. Any recent hospitalizations or ED visits since last visit with CPP? No What diet changes have been made to improve Blood Pressure Control? Patient states she follows Weight watchers: meal plans (unspecified) What exercise is being done to improve your Blood Pressure Control? Patient states she does yardwork and use  a cubii exercise machine.  Adherence Review: Is the patient currently on ACE/ARB medication? No Does the patient have >5 day gap between last estimated fill dates? No  Raynelle Highland, [place] Pharmicist Assistant [phone]   Follow-Up:  Pharmacist Review

## 2020-06-26 ENCOUNTER — Telehealth: Payer: Self-pay

## 2020-06-27 ENCOUNTER — Other Ambulatory Visit: Payer: Self-pay | Admitting: Family Medicine

## 2020-07-03 DIAGNOSIS — R69 Illness, unspecified: Secondary | ICD-10-CM | POA: Diagnosis not present

## 2020-07-13 ENCOUNTER — Ambulatory Visit (INDEPENDENT_AMBULATORY_CARE_PROVIDER_SITE_OTHER): Payer: Medicare HMO | Admitting: Family Medicine

## 2020-07-13 ENCOUNTER — Other Ambulatory Visit: Payer: Self-pay

## 2020-07-13 ENCOUNTER — Encounter: Payer: Self-pay | Admitting: Family Medicine

## 2020-07-13 VITALS — BP 138/80 | HR 72 | Temp 97.9°F | Ht 61.0 in | Wt 175.2 lb

## 2020-07-13 DIAGNOSIS — Z23 Encounter for immunization: Secondary | ICD-10-CM

## 2020-07-13 DIAGNOSIS — R1013 Epigastric pain: Secondary | ICD-10-CM

## 2020-07-13 LAB — CBC
HCT: 38.3 % (ref 35.0–45.0)
Hemoglobin: 12.5 g/dL (ref 11.7–15.5)
MCH: 30.4 pg (ref 27.0–33.0)
MCHC: 32.6 g/dL (ref 32.0–36.0)
MCV: 93.2 fL (ref 80.0–100.0)
MPV: 11.7 fL (ref 7.5–12.5)
Platelets: 164 10*3/uL (ref 140–400)
RBC: 4.11 10*6/uL (ref 3.80–5.10)
RDW: 12.7 % (ref 11.0–15.0)
WBC: 5.9 10*3/uL (ref 3.8–10.8)

## 2020-07-13 LAB — COMPREHENSIVE METABOLIC PANEL
AG Ratio: 1.5 (calc) (ref 1.0–2.5)
ALT: 12 U/L (ref 6–29)
AST: 12 U/L (ref 10–35)
Albumin: 4 g/dL (ref 3.6–5.1)
Alkaline phosphatase (APISO): 59 U/L (ref 37–153)
BUN: 20 mg/dL (ref 7–25)
CO2: 29 mmol/L (ref 20–32)
Calcium: 9.4 mg/dL (ref 8.6–10.4)
Chloride: 105 mmol/L (ref 98–110)
Creat: 0.88 mg/dL (ref 0.60–0.88)
Globulin: 2.6 g/dL (calc) (ref 1.9–3.7)
Glucose, Bld: 94 mg/dL (ref 65–99)
Potassium: 4.8 mmol/L (ref 3.5–5.3)
Sodium: 140 mmol/L (ref 135–146)
Total Bilirubin: 0.4 mg/dL (ref 0.2–1.2)
Total Protein: 6.6 g/dL (ref 6.1–8.1)

## 2020-07-13 LAB — LIPASE: Lipase: 18 U/L (ref 7–60)

## 2020-07-13 MED ORDER — PANTOPRAZOLE SODIUM 40 MG PO TBEC
40.0000 mg | DELAYED_RELEASE_TABLET | Freq: Every day | ORAL | 0 refills | Status: DC
Start: 2020-07-13 — End: 2020-08-15

## 2020-07-13 NOTE — Progress Notes (Signed)
   Joniya Boberg is a 79 y.o. female who presents today for an office visit.  Assessment/Plan:  Epigastric abdominal pain Concern for biliary colic given symptoms occur exclusively after eating.  Benign abdominal exam today.  Peptic ulcer disease also consideration given that she is on Fosamax.  Will check labs today including CBC, CMET, and lipase.  We'll also check right upper quadrant ultrasound.  We'll also start Protonix 40 mg daily to cover for possible PUD.  Discussed reasons to return to care.  May consider referral to GI if symptoms persist despite above work-up.    Subjective:  HPI:  Started about a month ago. Pain in epigastric area. Took tylenol which helped for 2 weeks then returned. No pain currently. Usually after eating. No fevers or chills. No nausea or vomiting.  Had an episode of diarrhea a few days ago. A lot of gas build up.        Objective:  Physical Exam: BP 138/80   Pulse 72   Temp 97.9 F (36.6 C) (Temporal)   Ht 5\' 1"  (1.549 m)   Wt 175 lb 3.2 oz (79.5 kg)   SpO2 99%   BMI 33.10 kg/m   Gen: No acute distress, resting comfortably CV: Regular rate and rhythm with no murmurs appreciated Pulm: Normal work of breathing, clear to auscultation bilaterally with no crackles, wheezes, or rhonchi GI: Soft, nontender, nondistended.  Murphy sign negative Neuro: Grossly normal, moves all extremities Psych: Normal affect and thought content      Vence Lalor M. Jerline Pain, MD 07/13/2020 8:59 AM

## 2020-07-13 NOTE — Patient Instructions (Signed)
It was very nice to see you today!  I'm worried that he may be having gallstone attacks.  We'll check blood work today.  We'll also check an ultrasound to take a look at your gallbladder.  This possible that she could have a stomach ulcer as well.  We'll start an acid blocker medication until we get more information.  If you have significant worsening of your pain or development of new symptoms such as fevers, chills, nausea, or vomiting, please seek medical care.  Take care, Dr Jerline Pain

## 2020-07-16 NOTE — Progress Notes (Signed)
Please inform patient of the following:  Labs are normal. Still waiting on ultrasound and we will contact her once we have the results.  Briana Smith. Jerline Pain, MD 07/16/2020 3:44 PM

## 2020-07-20 ENCOUNTER — Ambulatory Visit
Admission: RE | Admit: 2020-07-20 | Discharge: 2020-07-20 | Disposition: A | Discharge status: Home / Self Care | Payer: Medicare HMO | Source: Ambulatory Visit | Attending: Family Medicine | Admitting: Family Medicine

## 2020-07-20 DIAGNOSIS — R1013 Epigastric pain: Secondary | ICD-10-CM

## 2020-07-23 ENCOUNTER — Other Ambulatory Visit: Payer: Self-pay | Admitting: *Deleted

## 2020-07-23 DIAGNOSIS — K8020 Calculus of gallbladder without cholecystitis without obstruction: Secondary | ICD-10-CM

## 2020-07-23 NOTE — Progress Notes (Signed)
Please inform patient of the following:  Ultrasound shows numerous gallstones. I think this could be what is explaning her symptoms. She also has a benign collection of blood vessels in her liver - do not think this is related.  Recommend we refer her to see a surgeon to talk about her gallstones.  Briana Smith. Jerline Pain, MD 07/23/2020 8:10 AM

## 2020-07-29 ENCOUNTER — Other Ambulatory Visit: Payer: Self-pay

## 2020-07-29 ENCOUNTER — Inpatient Hospital Stay (HOSPITAL_COMMUNITY)
Admission: EM | Admit: 2020-07-29 | Discharge: 2020-08-02 | DRG: 419 | Disposition: A | Discharge status: Home / Self Care | Payer: Medicare HMO | Attending: Physician Assistant | Admitting: Physician Assistant

## 2020-07-29 ENCOUNTER — Emergency Department (HOSPITAL_COMMUNITY): Payer: Medicare HMO

## 2020-07-29 ENCOUNTER — Encounter (HOSPITAL_COMMUNITY): Payer: Self-pay | Admitting: Student in an Organized Health Care Education/Training Program

## 2020-07-29 DIAGNOSIS — Z96642 Presence of left artificial hip joint: Secondary | ICD-10-CM | POA: Diagnosis present

## 2020-07-29 DIAGNOSIS — K810 Acute cholecystitis: Secondary | ICD-10-CM | POA: Diagnosis present

## 2020-07-29 DIAGNOSIS — R109 Unspecified abdominal pain: Secondary | ICD-10-CM

## 2020-07-29 DIAGNOSIS — K8063 Calculus of gallbladder and bile duct with acute cholecystitis with obstruction: Principal | ICD-10-CM | POA: Diagnosis present

## 2020-07-29 DIAGNOSIS — Z20822 Contact with and (suspected) exposure to covid-19: Secondary | ICD-10-CM | POA: Diagnosis present

## 2020-07-29 DIAGNOSIS — R1011 Right upper quadrant pain: Secondary | ICD-10-CM | POA: Diagnosis not present

## 2020-07-29 DIAGNOSIS — Z419 Encounter for procedure for purposes other than remedying health state, unspecified: Secondary | ICD-10-CM

## 2020-07-29 DIAGNOSIS — E785 Hyperlipidemia, unspecified: Secondary | ICD-10-CM | POA: Diagnosis present

## 2020-07-29 DIAGNOSIS — Z90710 Acquired absence of both cervix and uterus: Secondary | ICD-10-CM

## 2020-07-29 DIAGNOSIS — Z79899 Other long term (current) drug therapy: Secondary | ICD-10-CM

## 2020-07-29 DIAGNOSIS — R932 Abnormal findings on diagnostic imaging of liver and biliary tract: Secondary | ICD-10-CM

## 2020-07-29 DIAGNOSIS — K8050 Calculus of bile duct without cholangitis or cholecystitis without obstruction: Secondary | ICD-10-CM

## 2020-07-29 DIAGNOSIS — Z9221 Personal history of antineoplastic chemotherapy: Secondary | ICD-10-CM

## 2020-07-29 DIAGNOSIS — I10 Essential (primary) hypertension: Secondary | ICD-10-CM | POA: Diagnosis present

## 2020-07-29 DIAGNOSIS — R739 Hyperglycemia, unspecified: Secondary | ICD-10-CM | POA: Diagnosis present

## 2020-07-29 DIAGNOSIS — Z791 Long term (current) use of non-steroidal anti-inflammatories (NSAID): Secondary | ICD-10-CM

## 2020-07-29 DIAGNOSIS — Z853 Personal history of malignant neoplasm of breast: Secondary | ICD-10-CM

## 2020-07-29 DIAGNOSIS — Z7983 Long term (current) use of bisphosphonates: Secondary | ICD-10-CM

## 2020-07-29 DIAGNOSIS — M810 Age-related osteoporosis without current pathological fracture: Secondary | ICD-10-CM | POA: Diagnosis present

## 2020-07-29 DIAGNOSIS — R7303 Prediabetes: Secondary | ICD-10-CM | POA: Diagnosis present

## 2020-07-29 DIAGNOSIS — Z923 Personal history of irradiation: Secondary | ICD-10-CM

## 2020-07-29 DIAGNOSIS — K812 Acute cholecystitis with chronic cholecystitis: Secondary | ICD-10-CM | POA: Diagnosis not present

## 2020-07-29 LAB — COMPREHENSIVE METABOLIC PANEL
ALT: 16 U/L (ref 0–44)
AST: 17 U/L (ref 15–41)
Albumin: 4 g/dL (ref 3.5–5.0)
Alkaline Phosphatase: 51 U/L (ref 38–126)
Anion gap: 12 (ref 5–15)
BUN: 14 mg/dL (ref 8–23)
CO2: 26 mmol/L (ref 22–32)
Calcium: 10 mg/dL (ref 8.9–10.3)
Chloride: 101 mmol/L (ref 98–111)
Creatinine, Ser: 1.02 mg/dL — ABNORMAL HIGH (ref 0.44–1.00)
GFR, Estimated: 55 mL/min — ABNORMAL LOW (ref >60)
Glucose, Bld: 181 mg/dL — ABNORMAL HIGH (ref 70–99)
Potassium: 4 mmol/L (ref 3.5–5.1)
Sodium: 139 mmol/L (ref 135–145)
Total Bilirubin: 0.8 mg/dL (ref 0.3–1.2)
Total Protein: 7.5 g/dL (ref 6.5–8.1)

## 2020-07-29 LAB — CBC
HCT: 42.8 % (ref 36.0–46.0)
Hemoglobin: 13.6 g/dL (ref 12.0–15.0)
MCH: 29.5 pg (ref 26.0–34.0)
MCHC: 31.8 g/dL (ref 30.0–36.0)
MCV: 92.8 fL (ref 80.0–100.0)
Platelets: 180 10*3/uL (ref 150–400)
RBC: 4.61 MIL/uL (ref 3.87–5.11)
RDW: 13.4 % (ref 11.5–15.5)
WBC: 13.4 10*3/uL — ABNORMAL HIGH (ref 4.0–10.5)
nRBC: 0 % (ref 0.0–0.2)

## 2020-07-29 LAB — LIPASE, BLOOD: Lipase: 33 U/L (ref 11–51)

## 2020-07-29 MED ORDER — ONDANSETRON 4 MG PO TBDP: 4.0000 mg | ORAL_TABLET | Freq: Four times a day (QID) | ORAL | Status: DC | PRN

## 2020-07-29 MED ORDER — SODIUM CHLORIDE 0.9 % IV SOLN
2.0000 g | INTRAVENOUS | Status: DC
  Administered 2020-07-29 – 2020-08-01 (×4): 2 g via INTRAVENOUS
  Filled 2020-07-29 (×4): qty 20

## 2020-07-29 MED ORDER — MORPHINE SULFATE (PF) 2 MG/ML IV SOLN
1.0000 mg | Freq: Once | INTRAVENOUS | Status: AC
  Administered 2020-07-29: 1 mg via INTRAVENOUS
  Filled 2020-07-29: qty 1

## 2020-07-29 MED ORDER — LACTATED RINGERS IV BOLUS
500.0000 mL | Freq: Once | INTRAVENOUS | Status: AC
  Administered 2020-07-29: 500 mL via INTRAVENOUS

## 2020-07-29 MED ORDER — HYDROMORPHONE HCL 1 MG/ML IJ SOLN
1.0000 mg | INTRAMUSCULAR | Status: DC | PRN
  Administered 2020-07-29 – 2020-07-30 (×3): 1 mg via INTRAVENOUS
  Filled 2020-07-29 (×3): qty 1

## 2020-07-29 MED ORDER — PANTOPRAZOLE SODIUM 40 MG IV SOLR
40.0000 mg | Freq: Every day | INTRAVENOUS | Status: DC
  Administered 2020-07-29 – 2020-07-30 (×2): 40 mg via INTRAVENOUS
  Filled 2020-07-29 (×2): qty 40

## 2020-07-29 MED ORDER — INSULIN ASPART 100 UNIT/ML ~~LOC~~ SOLN
0.0000 [IU] | Freq: Three times a day (TID) | SUBCUTANEOUS | Status: DC
  Administered 2020-07-30: 2 [IU] via SUBCUTANEOUS
  Administered 2020-07-30 (×2): 3 [IU] via SUBCUTANEOUS
  Administered 2020-07-31: 1 [IU] via SUBCUTANEOUS
  Administered 2020-08-01: 2 [IU] via SUBCUTANEOUS

## 2020-07-29 MED ORDER — ACETAMINOPHEN 500 MG PO TABS
1000.0000 mg | ORAL_TABLET | Freq: Four times a day (QID) | ORAL | Status: DC
  Administered 2020-07-30 – 2020-08-02 (×7): 1000 mg via ORAL
  Filled 2020-07-29 (×9): qty 2

## 2020-07-29 MED ORDER — DOCUSATE SODIUM 100 MG PO CAPS
100.0000 mg | ORAL_CAPSULE | Freq: Two times a day (BID) | ORAL | Status: DC
  Administered 2020-07-29 – 2020-08-02 (×5): 100 mg via ORAL
  Filled 2020-07-29 (×5): qty 1

## 2020-07-29 MED ORDER — DEXTROSE-NACL 5-0.45 % IV SOLN: INTRAVENOUS | Status: DC

## 2020-07-29 MED ORDER — ONDANSETRON HCL 4 MG/2ML IJ SOLN
4.0000 mg | Freq: Once | INTRAMUSCULAR | Status: AC
  Administered 2020-07-29: 4 mg via INTRAVENOUS
  Filled 2020-07-29: qty 2

## 2020-07-29 MED ORDER — ONDANSETRON HCL 4 MG/2ML IJ SOLN
4.0000 mg | Freq: Four times a day (QID) | INTRAMUSCULAR | Status: DC | PRN
  Administered 2020-07-30 – 2020-08-01 (×2): 4 mg via INTRAVENOUS
  Filled 2020-07-29: qty 2

## 2020-07-29 NOTE — ED Notes (Signed)
Pt admitted to [facility] as overflow; report called to [place], Therapist, sports.

## 2020-07-29 NOTE — ED Provider Notes (Signed)
I saw and evaluated the patient, reviewed the resident's note and I agree with the findings and plan.  EKG:   79 year old female presents with upper quadrant abdominal pain.  Has had multiple episodes of pain with eating.  Has known history of gallstones.  Here due to worsening pain she has a mild leukocytosis.  Will consult general surgery   Lacretia Leigh, MD 07/29/20 2121

## 2020-07-29 NOTE — H&P (Signed)
General Surgery H&P  Chief Complaint: abdominal pain HPI: Briana Smith is an 79 y.o. female with PMH of breast cancer, HTN, HLD, who presents with recurrent RUQ abdominal pain after eating. She was seen on 10/08 at her primary care physician with these symptoms and US revealed cholelithiasis. She has also had two prior episodes in the past month. She now presents with persistent RUQ pain for one day duration. It is minimally relieved by Advil, of which she has taken up to 1200mg  at a time.  In the ED she has a white count of 13.4, gluc 181, BUN 14, CR 1.02, AST 17, ALT 16, Bili 0.8.   Past Medical History:  Diagnosis Date  . Breast cancer ([place])   . [facility])    breast cancer- left  . Heart murmur    years ago. no recent issues  . Hyperlipidemia   . Hypertension   . Osteoporosis 01/20/2014   (5) DEXA scan 08/31/2013 shows osteopenia with a T score of -2.0; zolendronate started April 2015, to be repeated every 6 months while on anastrozole.  2016 dexa stable. Repeat 2019  . Personal history of chemotherapy   . Personal history of radiation therapy     Past Surgical History:  Procedure Laterality Date  . ABDOMINAL HYSTERECTOMY  1987   fibroids  . BREAST BIOPSY    . BREAST LUMPECTOMY  10/23/10   left  . PORT-A-CATH REMOVAL  09/02/2011   Procedure: REMOVAL PORT-A-CATH;  Surgeon: Adin Hector, MD;  Location: [facility];  Service: General;  Laterality: Right;  . TOTAL HIP ARTHROPLASTY Left 04/03/2017   Procedure: IRRIGATION AND DEBRIDIMENT LEFT HIP;  Surgeon: Rod Can, MD;  Location: [facility];  Service: Orthopedics;  Laterality: Left;    Family History  Problem Relation Age of Onset  . COPD Father   . Diabetes Mother   . Dementia Mother   . Kidney disease Mother        not on dialysis  . Cancer Brother        jaw  . Hypertension Brother   . Breast cancer Paternal Aunt    Social History:  reports that she has never smoked. She has never used  smokeless tobacco. She reports that she does not drink alcohol and does not use drugs.  Allergies: No Known Allergies  (Not in a hospital admission)   Results for orders placed or performed during the hospital encounter of 07/29/20 (from the past 48 hour(s))  Lipase, blood     Status: None   Collection Time: 07/29/20  7:13 PM  Result Value Ref Range   Lipase 33 11 - 51 U/L    Comment: Performed at [facility], [address] [place], [place] [address]  Comprehensive metabolic panel     Status: Abnormal   Collection Time: 07/29/20  7:13 PM  Result Value Ref Range   Sodium 139 135 - 145 mmol/L   Potassium 4.0 3.5 - 5.1 mmol/L   Chloride 101 98 - 111 mmol/L   CO2 26 22 - 32 mmol/L   Glucose, Bld 181 (H) 70 - 99 mg/dL    Comment: Glucose reference range applies only to samples taken after fasting for at least 8 hours.   BUN 14 8 - 23 mg/dL   Creatinine, Ser 1.02 (H) 0.44 - 1.00 mg/dL   Calcium 10.0 8.9 - 10.3 mg/dL   Total Protein 7.5 6.5 - 8.1 g/dL   Albumin 4.0 3.5 -  5.0 g/dL   AST 17 15 - 41 U/L   ALT 16 0 - 44 U/L   Alkaline Phosphatase 51 38 - 126 U/L   Total Bilirubin 0.8 0.3 - 1.2 mg/dL   GFR, Estimated 55 (L) >60 mL/min    Comment: (NOTE) Calculated using the CKD-EPI Creatinine Equation (2021)    Anion gap 12 5 - 15    Comment: Performed at [facility] [address] [place], [place] [address]  CBC     Status: Abnormal   Collection Time: 07/29/20  7:13 PM  Result Value Ref Range   WBC 13.4 (H) 4.0 - 10.5 K/uL   RBC 4.61 3.87 - 5.11 MIL/uL   Hemoglobin 13.6 12.0 - 15.0 g/dL   HCT 42.8 36 - 46 %   MCV 92.8 80.0 - 100.0 fL   MCH 29.5 26.0 - 34.0 pg   MCHC 31.8 30.0 - 36.0 g/dL   RDW 13.4 11.5 - 15.5 %   Platelets 180 150 - 400 K/uL   nRBC 0.0 0.0 - 0.2 %    Comment: Performed at [facility], [place] [address] [place], [place] [address]   No results found.  ROS  Blood pressure (!) 162/102, pulse 83, temperature 98.3 F (36.8 C),  temperature source Oral, resp. rate 18, SpO2 99 %.  Physical Examination:  General appearance - alert, well appearing, and in no distress Mental status - alert, oriented to person, place, and time HEENT: without lesion, anicteric sclera  Neck: supple, without appreciable lymphadenopathy Chest - breathing comfortably on room air, without respiratory distress Abdomen - soft, RUQ tender, nondistended  Extremities - no edema Lymphatic: no lymphadenopathy in the neck or groin Neuro: cranial nerves grossly intact.  Sensation intact to light touch diffusely. Psych: appropriate mood and affect, normal insight/judgment intact  Skin: warm and dry   Imaging [place] 10/16: IMPRESSION: 1. Gallbladder suboptimally assessed due to extensive shadowing just inside the gallbladder wall consistent with numerous stones. The wall appears thickened, but is difficult to accurately assess. There is no sonographic Murphy's sign. Acute cholecystitis should be considered in the proper clinical setting and may warrant a follow-up HIDA scan. 2. Heterogeneous appearance of the liver, nonspecific. 1.8 cm may hemangioma in the right lobe and small cyst near the gallbladder fossa.  Assessment/Plan [name] is an 79 y.o. female who presents with signs and symptoms concerning for acute cholecystitis.    - Will plan for laparoscopic cholecystectomy - NPO midnight, LFLC diet until then - IVF  - IV ABX - ISS - Thank you for this consult, surgery will admit  Ahmed Prima, MD [facility] PGY 5  General Surgery  This consult was seen in conjunction with the attending physician. Assessment and plan as stated unless otherwise noted in attending addendum.

## 2020-07-29 NOTE — ED Triage Notes (Signed)
Pt here with upper abd pain since yesterday. States recently dx with gall stones. Denies NVD. US done last week.

## 2020-07-29 NOTE — ED Provider Notes (Signed)
[facility] EMERGENCY DEPARTMENT Provider Note   CSN: [id number] Arrival date & time: 07/29/20  1851     History Chief Complaint  Patient presents with  . Abdominal Pain    Briana Smith is a 79 y.o. female.  The history is provided by the patient.  Abdominal Pain Pain location:  RUQ Pain radiates to:  Back Pain severity:  Severe Onset quality:  Sudden Duration:  1 day Timing:  Constant Progression:  Worsening Chronicity:  New Relieved by:  Nothing Worsened by:  Nothing Associated symptoms: no chest pain, no chills, no constipation, no cough, no diarrhea, no dysuria, no fever, no hematuria, no nausea, no shortness of breath, no sore throat and no vomiting   Risk factors comment:  Recent GB u/s with stones and wall thickening      Past Medical History:  Diagnosis Date  . Breast cancer ([place])   . Cancer [facility])    breast cancer- left  . Heart murmur    years ago. no recent issues  . Hyperlipidemia   . Hypertension   . Osteoporosis 01/20/2014   (5) DEXA scan 08/31/2013 shows osteopenia with a T score of -2.0; zolendronate started April 2015, to be repeated every 6 months while on anastrozole.  2016 dexa stable. Repeat 2019  . Personal history of chemotherapy   . Personal history of radiation therapy     Patient Active Problem List   Diagnosis Date Noted  . Acute cholecystitis 07/29/2020  . Senile purpura ([place]) 01/25/2020  . History of septic arthritis 07/26/2018  . Cellulitis of left breast 03/28/2017  . Hyperlipidemia 02/02/2017  . Hyperglycemia 03/19/2015  . Osteoporosis 01/20/2014  . History of breast cancer 06/30/2013  . Essential hypertension 08/03/2008  . SKIN CANCER, HX OF 08/03/2008    Past Surgical History:  Procedure Laterality Date  . ABDOMINAL HYSTERECTOMY  1987   fibroids  . BREAST BIOPSY    . BREAST LUMPECTOMY  10/23/10   left  . PORT-A-CATH REMOVAL  09/02/2011   Procedure: REMOVAL PORT-A-CATH;  Surgeon: Adin Hector, MD;  Location: [facility];  Service: General;  Laterality: Right;  . TOTAL HIP ARTHROPLASTY Left 04/03/2017   Procedure: IRRIGATION AND DEBRIDIMENT LEFT HIP;  Surgeon: Rod Can, MD;  Location: [facility];  Service: Orthopedics;  Laterality: Left;     OB History   No obstetric history on file.     Family History  Problem Relation Age of Onset  . COPD Father   . Diabetes Mother   . Dementia Mother   . Kidney disease Mother        not on dialysis  . Cancer Brother        jaw  . Hypertension Brother   . Breast cancer Paternal Aunt     Social History   Tobacco Use  . Smoking status: Never Smoker  . Smokeless tobacco: Never Used  Substance Use Topics  . Alcohol use: No  . Drug use: No    Home Medications Prior to Admission medications   Medication Sig Start Date End Date Taking? Authorizing Provider  alendronate (FOSAMAX) 70 MG tablet TAKE 1 TABLET BY MOUTH ONCE A WEEK ON AN EMPTY STOMACH WITH  FULL  GLASS  OF  WATER Patient taking differently: Take 70 mg by mouth once a week.  06/27/20  Yes [name], MD  cholecalciferol (VITAMIN D) 1000 units tablet Take 2,000 Units by mouth daily.   Yes [provider]  CYANOCOBALAMIN PO Take 1 tablet by mouth daily.    Yes [provider]  diclofenac sodium (VOLTAREN) 1 % GEL Apply 4 g topically 4 (four) times daily. To affected joint. 08/09/19  Yes Gregor Hams, MD  Multiple Vitamin (MULTIVITAMIN) tablet Take 1 tablet by mouth daily.     Yes [provider]  pantoprazole (PROTONIX) 40 MG tablet Take 1 tablet (40 mg total) by mouth daily. 07/13/20  Yes Vivi Barrack, MD  pyridoxine (B-6) 100 MG tablet Take 100 mg by mouth daily.    Yes [provider]  senna-docusate (SENOKOT-S) 8.6-50 MG tablet Take 1 tablet by mouth at bedtime as needed for mild constipation. 04/06/17  Yes Florencia Reasons, MD  Pseudoeph-Doxylamine-DM-APAP (DAYQUIL/NYQUIL COLD/FLU RELIEF PO) Take by mouth.      [provider]    Allergies    Patient has no known allergies.  Review of Systems   Review of Systems  Constitutional: Negative for chills and fever.  HENT: Negative for congestion and sore throat.   Respiratory: Negative for cough and shortness of breath.   Cardiovascular: Negative for chest pain and leg swelling.  Gastrointestinal: Positive for abdominal pain. Negative for constipation, diarrhea, nausea and vomiting.  Genitourinary: Negative for difficulty urinating, dysuria and hematuria.  Musculoskeletal: Positive for back pain.  All other systems reviewed and are negative.   Physical Exam Updated Vital Signs BP (!) 154/73   Pulse 77   Temp 98.3 F (36.8 C) (Oral)   Resp 18   SpO2 100%   Physical Exam Vitals reviewed.  Constitutional:      Appearance: She is well-developed.     Comments: Uncomfortable appearing  HENT:     Head: Normocephalic and atraumatic.     Nose: Nose normal.     Mouth/Throat:     Pharynx: Oropharynx is clear.  Eyes:     Conjunctiva/sclera: Conjunctivae normal.  Cardiovascular:     Rate and Rhythm: Normal rate.     Heart sounds: Normal heart sounds.  Pulmonary:     Effort: Pulmonary effort is normal.     Breath sounds: Normal breath sounds.  Abdominal:     General: Abdomen is flat.     Palpations: Abdomen is soft.     Tenderness: There is abdominal tenderness. There is no guarding or rebound.     Comments: RUQ tenderness  Musculoskeletal:     Cervical back: Neck supple. No tenderness.     Right lower leg: No edema.     Left lower leg: No edema.  Skin:    General: Skin is warm and dry.     Capillary Refill: Capillary refill takes less than 2 seconds.  Neurological:     Mental Status: She is alert.     ED Results / Procedures / Treatments   Labs (all labs ordered are listed, but only abnormal results are displayed) Labs Reviewed  COMPREHENSIVE METABOLIC PANEL - Abnormal; Notable for the following components:       Result Value   Glucose, Bld 181 (*)    Creatinine, Ser 1.02 (*)    GFR, Estimated 55 (*)    All other components within normal limits  CBC - Abnormal; Notable for the following components:   WBC 13.4 (*)    All other components within normal limits  RESPIRATORY PANEL BY RT PCR (FLU A&B, COVID)  LIPASE, BLOOD  URINALYSIS, ROUTINE W REFLEX MICROSCOPIC  COMPREHENSIVE METABOLIC PANEL  CBC  HEMOGLOBIN A1C    EKG None  Radiology No results found.  Procedures Procedures (including critical care time)  Medications Ordered in ED Medications  HYDROmorphone (DILAUDID) injection 1 mg (has no administration in time range)  cefTRIAXone (ROCEPHIN) 2 g in sodium chloride 0.9 % 100 mL IVPB (has no administration in time range)  acetaminophen (TYLENOL) tablet 1,000 mg (has no administration in time range)  docusate sodium (COLACE) capsule 100 mg (has no administration in time range)  ondansetron (ZOFRAN-ODT) disintegrating tablet 4 mg (has no administration in time range)    Or  ondansetron (ZOFRAN) injection 4 mg (has no administration in time range)  pantoprazole (PROTONIX) injection 40 mg (has no administration in time range)  insulin aspart (novoLOG) injection 0-9 Units (has no administration in time range)  dextrose 5 %-0.45 % sodium chloride infusion (has no administration in time range)  morphine 2 MG/ML injection 1 mg (1 mg Intravenous Given 07/29/20 2125)  lactated ringers bolus 500 mL (0 mLs Intravenous Stopped 07/29/20 2209)  ondansetron (ZOFRAN) injection 4 mg (4 mg Intravenous Given 07/29/20 2125)    ED Course  I have reviewed the triage vital signs and the nursing notes.  Pertinent labs & imaging results that were available during my care of the patient were reviewed by me and considered in my medical decision making (see chart for details).    MDM Rules/Calculators/A&P                           Medical Decision Making: Briana Smith is a 79 y.o. female who  presented to the ED today with abdominal pain. Pt reports pain worsening since 1 pm. RUQ radiating to R back. Pt reports recent diagnosis of gallstones. Pt denies fever or vomiting.  Past medical history significant for  Pt seen by PCP on 07/13/20 and reporting epigastric pain with eating, concern for peptic ulcer vs GB, given protonix and ordered outpt u/s.  Reviewed and confirmed nursing documentation for past medical history, family history, social history.  On my initial exam, the pt was uncomfortable appearing, hemodynamically stable. RUQ tenderness to palpation, no rebound or guarding.     Ultrasound from 10/15 shows- numerous GB stones. The wall appears thickened, but is difficult to accurately assess.   Today pt has leukocytosis, worsening pain and difficulty eating.  Consults: General Surgery for suspected cholecystitis   PLAN: Lap choly, NPO midnight, IVF, IV abx  All radiology and laboratory studies reviewed independently and with my attending physician, agree with reading provided by radiologist unless otherwise noted.  Based on the above findings, pt to go to OR for cholecystectomy per Gen Surg.   The above care was discussed with and agreed upon by my attending physician. Emergency Department Medication Summary:  Medications  HYDROmorphone (DILAUDID) injection 1 mg (has no administration in time range)  cefTRIAXone (ROCEPHIN) 2 g in sodium chloride 0.9 % 100 mL IVPB (has no administration in time range)  acetaminophen (TYLENOL) tablet 1,000 mg (has no administration in time range)  docusate sodium (COLACE) capsule 100 mg (has no administration in time range)  ondansetron (ZOFRAN-ODT) disintegrating tablet 4 mg (has no administration in time range)    Or  ondansetron (ZOFRAN) injection 4 mg (has no administration in time range)  pantoprazole (PROTONIX) injection 40 mg (has no administration in time range)  insulin aspart (novoLOG) injection 0-9 Units (has no administration  in time range)  dextrose 5 %-0.45 % sodium chloride infusion (has no administration in time range)  morphine  2 MG/ML injection 1 mg (1 mg Intravenous Given 07/29/20 2125)  lactated ringers bolus 500 mL (0 mLs Intravenous Stopped 07/29/20 2209)  ondansetron (ZOFRAN) injection 4 mg (4 mg Intravenous Given 07/29/20 2125)       Final Clinical Impression(s) / ED Diagnoses Final diagnoses:  Abdominal pain    Rx / DC Orders ED Discharge Orders    None       Roosevelt Locks, MD 07/29/20 2240    Lacretia Leigh, MD 07/31/20 1214

## 2020-07-30 ENCOUNTER — Observation Stay (HOSPITAL_COMMUNITY): Payer: Medicare HMO | Admitting: Certified Registered"

## 2020-07-30 ENCOUNTER — Encounter (HOSPITAL_COMMUNITY): Admission: EM | Disposition: A | Discharge status: Home / Self Care | Payer: Self-pay | Source: Home / Self Care

## 2020-07-30 ENCOUNTER — Observation Stay (HOSPITAL_COMMUNITY): Payer: Medicare HMO

## 2020-07-30 DIAGNOSIS — I10 Essential (primary) hypertension: Secondary | ICD-10-CM | POA: Diagnosis not present

## 2020-07-30 DIAGNOSIS — Z9221 Personal history of antineoplastic chemotherapy: Secondary | ICD-10-CM | POA: Diagnosis not present

## 2020-07-30 DIAGNOSIS — R739 Hyperglycemia, unspecified: Secondary | ICD-10-CM | POA: Diagnosis not present

## 2020-07-30 DIAGNOSIS — R7303 Prediabetes: Secondary | ICD-10-CM | POA: Diagnosis present

## 2020-07-30 DIAGNOSIS — E785 Hyperlipidemia, unspecified: Secondary | ICD-10-CM | POA: Diagnosis not present

## 2020-07-30 DIAGNOSIS — Z90710 Acquired absence of both cervix and uterus: Secondary | ICD-10-CM | POA: Diagnosis not present

## 2020-07-30 DIAGNOSIS — Z79899 Other long term (current) drug therapy: Secondary | ICD-10-CM | POA: Diagnosis not present

## 2020-07-30 DIAGNOSIS — Z853 Personal history of malignant neoplasm of breast: Secondary | ICD-10-CM | POA: Diagnosis not present

## 2020-07-30 DIAGNOSIS — K8050 Calculus of bile duct without cholangitis or cholecystitis without obstruction: Secondary | ICD-10-CM | POA: Diagnosis not present

## 2020-07-30 DIAGNOSIS — R932 Abnormal findings on diagnostic imaging of liver and biliary tract: Secondary | ICD-10-CM | POA: Diagnosis not present

## 2020-07-30 DIAGNOSIS — Z96642 Presence of left artificial hip joint: Secondary | ICD-10-CM | POA: Diagnosis present

## 2020-07-30 DIAGNOSIS — Z20822 Contact with and (suspected) exposure to covid-19: Secondary | ICD-10-CM | POA: Diagnosis not present

## 2020-07-30 DIAGNOSIS — K8063 Calculus of gallbladder and bile duct with acute cholecystitis with obstruction: Secondary | ICD-10-CM | POA: Diagnosis not present

## 2020-07-30 DIAGNOSIS — Z7983 Long term (current) use of bisphosphonates: Secondary | ICD-10-CM | POA: Diagnosis not present

## 2020-07-30 DIAGNOSIS — Z791 Long term (current) use of non-steroidal anti-inflammatories (NSAID): Secondary | ICD-10-CM | POA: Diagnosis not present

## 2020-07-30 DIAGNOSIS — K8020 Calculus of gallbladder without cholecystitis without obstruction: Secondary | ICD-10-CM | POA: Diagnosis not present

## 2020-07-30 DIAGNOSIS — M810 Age-related osteoporosis without current pathological fracture: Secondary | ICD-10-CM | POA: Diagnosis not present

## 2020-07-30 DIAGNOSIS — K810 Acute cholecystitis: Secondary | ICD-10-CM | POA: Diagnosis present

## 2020-07-30 DIAGNOSIS — K812 Acute cholecystitis with chronic cholecystitis: Secondary | ICD-10-CM | POA: Diagnosis not present

## 2020-07-30 DIAGNOSIS — Z923 Personal history of irradiation: Secondary | ICD-10-CM | POA: Diagnosis not present

## 2020-07-30 DIAGNOSIS — K8042 Calculus of bile duct with acute cholecystitis without obstruction: Secondary | ICD-10-CM | POA: Diagnosis not present

## 2020-07-30 HISTORY — PX: CHOLECYSTECTOMY: SHX55

## 2020-07-30 LAB — RESPIRATORY PANEL BY RT PCR (FLU A&B, COVID)
Influenza A by PCR: NEGATIVE
Influenza B by PCR: NEGATIVE
SARS Coronavirus 2 by RT PCR: NEGATIVE

## 2020-07-30 LAB — CBC
HCT: 40.3 % (ref 36.0–46.0)
Hemoglobin: 13 g/dL (ref 12.0–15.0)
MCH: 29.9 pg (ref 26.0–34.0)
MCHC: 32.3 g/dL (ref 30.0–36.0)
MCV: 92.6 fL (ref 80.0–100.0)
Platelets: 161 10*3/uL (ref 150–400)
RBC: 4.35 MIL/uL (ref 3.87–5.11)
RDW: 13.2 % (ref 11.5–15.5)
WBC: 10.4 10*3/uL (ref 4.0–10.5)
nRBC: 0 % (ref 0.0–0.2)

## 2020-07-30 LAB — COMPREHENSIVE METABOLIC PANEL
ALT: 944 U/L — ABNORMAL HIGH (ref 0–44)
AST: 1315 U/L — ABNORMAL HIGH (ref 15–41)
Albumin: 3.5 g/dL (ref 3.5–5.0)
Alkaline Phosphatase: 107 U/L (ref 38–126)
Anion gap: 11 (ref 5–15)
BUN: 11 mg/dL (ref 8–23)
CO2: 23 mmol/L (ref 22–32)
Calcium: 9.1 mg/dL (ref 8.9–10.3)
Chloride: 102 mmol/L (ref 98–111)
Creatinine, Ser: 0.86 mg/dL (ref 0.44–1.00)
GFR, Estimated: >60 mL/min (ref >60)
Glucose, Bld: 206 mg/dL — ABNORMAL HIGH (ref 70–99)
Potassium: 3.8 mmol/L (ref 3.5–5.1)
Sodium: 136 mmol/L (ref 135–145)
Total Bilirubin: 2.9 mg/dL — ABNORMAL HIGH (ref 0.3–1.2)
Total Protein: 6.9 g/dL (ref 6.5–8.1)

## 2020-07-30 LAB — SURGICAL PCR SCREEN
MRSA, PCR: NEGATIVE
Staphylococcus aureus: NEGATIVE

## 2020-07-30 LAB — GLUCOSE, CAPILLARY
Glucose-Capillary: 191 mg/dL — ABNORMAL HIGH (ref 70–99)
Glucose-Capillary: 211 mg/dL — ABNORMAL HIGH (ref 70–99)
Glucose-Capillary: 228 mg/dL — ABNORMAL HIGH (ref 70–99)
Glucose-Capillary: 129 mg/dL — ABNORMAL HIGH (ref 70–99)

## 2020-07-30 SURGERY — LAPAROSCOPIC CHOLECYSTECTOMY WITH INTRAOPERATIVE CHOLANGIOGRAM
Anesthesia: General | Site: Abdomen

## 2020-07-30 MED ORDER — DROPERIDOL 2.5 MG/ML IJ SOLN
INTRAMUSCULAR | Status: DC | PRN
  Administered 2020-07-30: .625 mg via INTRAVENOUS

## 2020-07-30 MED ORDER — GLUCAGON HCL RDNA (DIAGNOSTIC) 1 MG IJ SOLR
INTRAMUSCULAR | Status: AC
  Filled 2020-07-30: qty 1

## 2020-07-30 MED ORDER — LIDOCAINE 2% (20 MG/ML) 5 ML SYRINGE
INTRAMUSCULAR | Status: DC | PRN
  Administered 2020-07-30: 100 mg via INTRAVENOUS

## 2020-07-30 MED ORDER — BUPIVACAINE HCL (PF) 0.25 % IJ SOLN
INTRAMUSCULAR | Status: AC
  Filled 2020-07-30: qty 30

## 2020-07-30 MED ORDER — OXYCODONE HCL 5 MG PO TABS: 5.0000 mg | ORAL_TABLET | Freq: Once | ORAL | Status: DC | PRN

## 2020-07-30 MED ORDER — LACTATED RINGERS IV SOLN: INTRAVENOUS | Status: DC

## 2020-07-30 MED ORDER — DEXAMETHASONE SODIUM PHOSPHATE 10 MG/ML IJ SOLN
INTRAMUSCULAR | Status: AC
  Filled 2020-07-30: qty 1

## 2020-07-30 MED ORDER — ONDANSETRON HCL 4 MG/2ML IJ SOLN: 4.0000 mg | Freq: Once | INTRAMUSCULAR | Status: AC

## 2020-07-30 MED ORDER — GLUCAGON HCL RDNA (DIAGNOSTIC) 1 MG IJ SOLR
INTRAMUSCULAR | Status: DC | PRN
  Administered 2020-07-30: 1 mg via INTRAVENOUS

## 2020-07-30 MED ORDER — FENTANYL CITRATE (PF) 250 MCG/5ML IJ SOLN
INTRAMUSCULAR | Status: AC
  Filled 2020-07-30: qty 5

## 2020-07-30 MED ORDER — ONDANSETRON HCL 4 MG/2ML IJ SOLN
INTRAMUSCULAR | Status: DC | PRN
  Administered 2020-07-30: 4 mg via INTRAVENOUS

## 2020-07-30 MED ORDER — DEXAMETHASONE SODIUM PHOSPHATE 10 MG/ML IJ SOLN
INTRAMUSCULAR | Status: DC | PRN
  Administered 2020-07-30: 10 mg via INTRAVENOUS

## 2020-07-30 MED ORDER — INDOCYANINE GREEN 25 MG IV SOLR
INTRAVENOUS | Status: DC | PRN
  Administered 2020-07-30: 2.5 mg via INTRAVENOUS

## 2020-07-30 MED ORDER — ONDANSETRON HCL 4 MG/2ML IJ SOLN: 4.0000 mg | Freq: Once | INTRAMUSCULAR | Status: DC | PRN

## 2020-07-30 MED ORDER — ROCURONIUM BROMIDE 10 MG/ML (PF) SYRINGE
PREFILLED_SYRINGE | INTRAVENOUS | Status: AC
  Filled 2020-07-30: qty 10

## 2020-07-30 MED ORDER — SUCCINYLCHOLINE CHLORIDE 200 MG/10ML IV SOSY
PREFILLED_SYRINGE | INTRAVENOUS | Status: DC | PRN
  Administered 2020-07-30: 100 mg via INTRAVENOUS

## 2020-07-30 MED ORDER — ONDANSETRON HCL 4 MG/2ML IJ SOLN
INTRAMUSCULAR | Status: AC
  Filled 2020-07-30: qty 2

## 2020-07-30 MED ORDER — BUPIVACAINE HCL (PF) 0.25 % IJ SOLN
INTRAMUSCULAR | Status: DC | PRN
  Administered 2020-07-30: 17 mL

## 2020-07-30 MED ORDER — CHLORHEXIDINE GLUCONATE 0.12 % MT SOLN
OROMUCOSAL | Status: AC
  Filled 2020-07-30: qty 15

## 2020-07-30 MED ORDER — MUPIROCIN 2 % EX OINT
1.0000 "application " | TOPICAL_OINTMENT | Freq: Two times a day (BID) | CUTANEOUS | Status: DC
  Administered 2020-07-30 – 2020-08-02 (×5): 1 via NASAL
  Filled 2020-07-30: qty 22

## 2020-07-30 MED ORDER — CHLORHEXIDINE GLUCONATE 0.12 % MT SOLN: 15.0000 mL | Freq: Once | OROMUCOSAL | Status: DC

## 2020-07-30 MED ORDER — SUGAMMADEX SODIUM 200 MG/2ML IV SOLN
INTRAVENOUS | Status: DC | PRN
  Administered 2020-07-30: 200 mg via INTRAVENOUS

## 2020-07-30 MED ORDER — LIDOCAINE 2% (20 MG/ML) 5 ML SYRINGE
INTRAMUSCULAR | Status: AC
  Filled 2020-07-30: qty 5

## 2020-07-30 MED ORDER — OXYCODONE HCL 5 MG/5ML PO SOLN: 5.0000 mg | Freq: Once | ORAL | Status: DC | PRN

## 2020-07-30 MED ORDER — SODIUM CHLORIDE 0.9 % IR SOLN
Status: DC | PRN
  Administered 2020-07-30: 1000 mL

## 2020-07-30 MED ORDER — SODIUM CHLORIDE 0.9 % IV SOLN
INTRAVENOUS | Status: DC | PRN
  Administered 2020-07-30: 11 mL

## 2020-07-30 MED ORDER — 0.9 % SODIUM CHLORIDE (POUR BTL) OPTIME
TOPICAL | Status: DC | PRN
  Administered 2020-07-30: 1000 mL

## 2020-07-30 MED ORDER — INDOCYANINE GREEN 25 MG IV SOLR
1.2500 mg | Freq: Once | INTRAVENOUS | Status: DC
  Filled 2020-07-30: qty 10

## 2020-07-30 MED ORDER — FENTANYL CITRATE (PF) 100 MCG/2ML IJ SOLN: 25.0000 ug | INTRAMUSCULAR | Status: DC | PRN

## 2020-07-30 MED ORDER — HEMOSTATIC AGENTS (NO CHARGE) OPTIME
TOPICAL | Status: DC | PRN
  Administered 2020-07-30: 1 via TOPICAL

## 2020-07-30 MED ORDER — FENTANYL CITRATE (PF) 250 MCG/5ML IJ SOLN
INTRAMUSCULAR | Status: DC | PRN
  Administered 2020-07-30 (×5): 50 ug via INTRAVENOUS

## 2020-07-30 MED ORDER — ROCURONIUM BROMIDE 10 MG/ML (PF) SYRINGE
PREFILLED_SYRINGE | INTRAVENOUS | Status: DC | PRN
  Administered 2020-07-30: 50 mg via INTRAVENOUS

## 2020-07-30 MED ORDER — DROPERIDOL 2.5 MG/ML IJ SOLN
INTRAMUSCULAR | Status: AC
  Filled 2020-07-30: qty 2

## 2020-07-30 MED ORDER — PROPOFOL 10 MG/ML IV BOLUS
INTRAVENOUS | Status: AC
  Filled 2020-07-30: qty 20

## 2020-07-30 MED ORDER — PROPOFOL 10 MG/ML IV BOLUS
INTRAVENOUS | Status: DC | PRN
  Administered 2020-07-30: 80 mg via INTRAVENOUS

## 2020-07-30 MED ORDER — ONDANSETRON HCL 4 MG/2ML IJ SOLN
INTRAMUSCULAR | Status: AC
  Administered 2020-07-30: 4 mg via INTRAVENOUS
  Filled 2020-07-30: qty 2

## 2020-07-30 SURGICAL SUPPLY — 45 items
APL PRP STRL LF DISP 70% ISPRP (MISCELLANEOUS) ×1
APL SKNCLS STERI-STRIP NONHPOA (GAUZE/BANDAGES/DRESSINGS) ×1
APPLIER CLIP ROT 10 11.4 M/L (STAPLE) ×2
APR CLP MED LRG 11.4X10 (STAPLE) ×1
BAG SPEC RTRVL 10 TROC 200 (ENDOMECHANICALS) ×1
BENZOIN TINCTURE PRP APPL 2/3 (GAUZE/BANDAGES/DRESSINGS) ×2 IMPLANT
CANISTER SUCT 3000ML PPV (MISCELLANEOUS) ×2 IMPLANT
CHLORAPREP W/TINT 26 (MISCELLANEOUS) ×2 IMPLANT
CLIP APPLIE ROT 10 11.4 M/L (STAPLE) ×1 IMPLANT
COVER MAYO STAND STRL (DRAPES) ×2 IMPLANT
COVER SURGICAL LIGHT HANDLE (MISCELLANEOUS) ×2 IMPLANT
DRAPE C-ARM 42X120 X-RAY (DRAPES) ×2 IMPLANT
DRSG TEGADERM 2-3/8X2-3/4 SM (GAUZE/BANDAGES/DRESSINGS) ×6 IMPLANT
DRSG TEGADERM 4X4.75 (GAUZE/BANDAGES/DRESSINGS) ×2 IMPLANT
ELECT REM PT RETURN 9FT ADLT (ELECTROSURGICAL) ×2
ELECTRODE REM PT RTRN 9FT ADLT (ELECTROSURGICAL) ×1 IMPLANT
GAUZE SPONGE 2X2 8PLY STRL LF (GAUZE/BANDAGES/DRESSINGS) ×1 IMPLANT
GLOVE BIO SURGEON STRL SZ7 (GLOVE) ×2 IMPLANT
GLOVE BIOGEL PI IND STRL 7.5 (GLOVE) ×1 IMPLANT
GLOVE BIOGEL PI INDICATOR 7.5 (GLOVE) ×1
GOWN STRL REUS W/ TWL LRG LVL3 (GOWN DISPOSABLE) ×3 IMPLANT
GOWN STRL REUS W/TWL LRG LVL3 (GOWN DISPOSABLE) ×6
HEMOSTAT SNOW SURGICEL 2X4 (HEMOSTASIS) ×2 IMPLANT
KIT BASIN OR (CUSTOM PROCEDURE TRAY) ×2 IMPLANT
KIT TURNOVER KIT B (KITS) ×2 IMPLANT
NS IRRIG 1000ML POUR BTL (IV SOLUTION) ×2 IMPLANT
PAD ARMBOARD 7.5X6 YLW CONV (MISCELLANEOUS) ×2 IMPLANT
POUCH RETRIEVAL ECOSAC 10 (ENDOMECHANICALS) ×1 IMPLANT
POUCH RETRIEVAL ECOSAC 10MM (ENDOMECHANICALS) ×2
SCISSORS LAP 5X35 DISP (ENDOMECHANICALS) ×2 IMPLANT
SET CHOLANGIOGRAPHY FRANKLIN (SET/KITS/TRAYS/PACK) ×2 IMPLANT
SET IRRIG TUBING LAPAROSCOPIC (IRRIGATION / IRRIGATOR) ×2 IMPLANT
SET TUBE SMOKE EVAC HIGH FLOW (TUBING) ×2 IMPLANT
SLEEVE ENDOPATH XCEL 5M (ENDOMECHANICALS) ×2 IMPLANT
SPONGE GAUZE 2X2 STER 10/PKG (GAUZE/BANDAGES/DRESSINGS) ×1
STOPCOCK 4 WAY LG BORE MALE ST (IV SETS) ×2 IMPLANT
STRIP CLOSURE SKIN 1/2X4 (GAUZE/BANDAGES/DRESSINGS) ×2 IMPLANT
SUT MNCRL AB 4-0 PS2 18 (SUTURE) ×2 IMPLANT
TOWEL GREEN STERILE (TOWEL DISPOSABLE) ×2 IMPLANT
TOWEL GREEN STERILE FF (TOWEL DISPOSABLE) ×2 IMPLANT
TRAY LAPAROSCOPIC MC (CUSTOM PROCEDURE TRAY) ×2 IMPLANT
TROCAR XCEL BLUNT TIP 100MML (ENDOMECHANICALS) ×2 IMPLANT
TROCAR XCEL NON-BLD 11X100MML (ENDOMECHANICALS) ×2 IMPLANT
TROCAR XCEL NON-BLD 5MMX100MML (ENDOMECHANICALS) ×2 IMPLANT
WATER STERILE IRR 1000ML POUR (IV SOLUTION) ×2 IMPLANT

## 2020-07-30 NOTE — Anesthesia Postprocedure Evaluation (Signed)
Anesthesia Post Note  Patient: Briana Smith  Procedure(s) Performed: LAPAROSCOPIC CHOLECYSTECTOMY WITH INTRAOPERATIVE CHOLANGIOGRAM AND ICG DYE (N/A Abdomen)     Patient location during evaluation: PACU Anesthesia Type: General Level of consciousness: awake and alert Pain management: pain level controlled Vital Signs Assessment: post-procedure vital signs reviewed and stable Respiratory status: spontaneous breathing, nonlabored ventilation, respiratory function stable and patient connected to nasal cannula oxygen Cardiovascular status: blood pressure returned to baseline and stable Postop Assessment: no apparent nausea or vomiting Anesthetic complications: no   No complications documented.  Last Vitals:  Vitals:   07/30/20 1115 07/30/20 1134  BP: (!) 128/58 (!) 142/60  Pulse: 74 76  Resp: 13 12  Temp: 36.7 C 36.6 C  SpO2: 95% 92%    Last Pain:  Vitals:   07/30/20 1134  TempSrc: Oral  PainSc:                  [name] S

## 2020-07-30 NOTE — Op Note (Signed)
Laparoscopic Cholecystectomy with IOC Procedure Note  Indications: This patient presents with symptomatic gallbladder disease and will undergo laparoscopic cholecystectomy.  Pre-operative Diagnosis: Calculus of gallbladder with acute cholecystitis, without mention of obstruction  Post-operative Diagnosis: Calculus of bile duct with acute cholecystitis and obstruction  Surgeon: Maia Petties   Assistants: Alferd Apa, PA-C  Anesthesia: General endotracheal anesthesia  ASA Class: 2  Procedure Details  The patient was seen again in the Holding Room. The risks, benefits, complications, treatment options, and expected outcomes were discussed with the patient. The possibilities of reaction to medication, pulmonary aspiration, perforation of viscus, bleeding, recurrent infection, finding a normal gallbladder, the need for additional procedures, failure to diagnose a condition, the possible need to convert to an open procedure, and creating a complication requiring transfusion or operation were discussed with the patient. The likelihood of improving the patient's symptoms with return to their baseline status is good.  The patient and/or family concurred with the proposed plan, giving informed consent. The site of surgery properly noted. The patient was taken to Operating Room, identified as Briana Smith and the procedure verified as Laparoscopic Cholecystectomy with Intraoperative Cholangiogram. A Time Out was held and the above information confirmed.  Prior to the induction of general anesthesia, antibiotic prophylaxis was administered. General endotracheal anesthesia was then administered and tolerated well. After the induction, the abdomen was prepped with Chloraprep and draped in the sterile fashion. The patient was positioned in the supine position.  Local anesthetic agent was injected into the skin near the umbilicus and an incision made. We dissected down to the abdominal fascia with  blunt dissection.  The fascia was incised vertically and we entered the peritoneal cavity bluntly.  A pursestring suture of 0-Vicryl was placed around the fascial opening.  The Hasson cannula was inserted and secured with the stay suture.  Pneumoperitoneum was then created with CO2 and tolerated well without any adverse changes in the patient's vital signs. An 11-mm port was placed in the subxiphoid position.  Two 5-mm ports were placed in the right upper quadrant. All skin incisions were infiltrated with a local anesthetic agent before making the incision and placing the trocars.   We positioned the patient in reverse Trendelenburg, tilted slightly to the patient's left.  The gallbladder was identified and it was quite thickened and inflamed.  We decompressed the gallbladder with the suction aspirator.  The fundus was grasped and retracted cephalad. Adhesions were lysed bluntly and with the electrocautery where indicated, taking care not to injure any adjacent organs or viscus. The infundibulum was grasped and retracted laterally, exposing the peritoneum overlying the triangle of Calot. This was then divided and exposed in a blunt fashion. A critical view of the cystic duct and cystic artery was obtained.  The cystic duct was clearly identified and bluntly dissected circumferentially. The cystic duct was ligated with a clip distally.   An incision was made in the cystic duct and the [facility] cholangiogram catheter introduced. The catheter was secured using a clip. A cholangiogram was then obtained which showed good visualization of the proximal biliary tree but contrast stopped in the distal common bile duct.  We administered glucagon, waited three minutes and repeated the cholangiogram.  The distal common bile duct obstruction persisted.  The catheter was then removed.   The cystic duct was then ligated with clips and divided. The cystic artery was identified, dissected free, ligated with clips and divided as  well.   The gallbladder was dissected from the  liver bed in retrograde fashion with the electrocautery. The gallbladder was removed and placed in an Eco sac. The liver bed was irrigated and inspected. Hemostasis was achieved with the electrocautery and SNOW. Copious irrigation was utilized and was repeatedly aspirated until clear.  The gallbladder and Ecosac were then removed through the umbilical port site.  The pursestring suture was used to close the umbilical fascia.    We again inspected the right upper quadrant for hemostasis.  Pneumoperitoneum was released as we removed the trocars.  4-0 Monocryl was used to close the skin.   Benzoin, steri-strips, and clean dressings were applied. The patient was then extubated and brought to the recovery room in stable condition. Instrument, sponge, and needle counts were correct at closure and at the conclusion of the case.   Findings: Cholecystitis with Cholelithiasis; distal common bile duct obstruction  Estimated Blood Loss: less than 50 mL         Drains: none         Specimens: Gallbladder           Complications: None; patient tolerated the procedure well.         Disposition: PACU - hemodynamically stable.         Condition: stable  Briana Smith. Briana Dover, MD, [facility] Surgery  General/ Trauma Surgery   07/30/2020 10:48 AM

## 2020-07-30 NOTE — Progress Notes (Signed)
Patient off the floor to surgery. Report called to [place], Therapist, sports.

## 2020-07-30 NOTE — Discharge Summary (Signed)
[facility] Discharge Summary   Patient ID: Tavonna Worthington MRN: [id number] DOB/AGE: [birth year] 79 y.o.  Admit date: 07/29/2020 Discharge date: 08/02/2020  Admitting Diagnosis: Acute cholecystitis   Discharge Diagnosis Acute Cholecystitis  Choledocholithiasis   Hyperglycemia  Hx of HTN Hx of HLD  Consultants GI  Imaging: RUQ U/S - IMPRESSION: 1. Gallbladder suboptimally assessed due to extensive shadowing just inside the gallbladder wall consistent with numerous stones. The wall appears thickened, but is difficult to accurately assess. There is no sonographic Murphy's sign. Acute cholecystitis should be considered in the proper clinical setting and may warrant a follow-up HIDA scan. 2. Heterogeneous appearance of the liver, nonspecific. 1.8 cm may hemangioma in the right lobe and small cyst near the gallbladder Fossa.  MRCP - IMPRESSION 1. Small filling defect (3-4 mm) within the distal common bile duct approximately 5 mm from the ampulla. Filling defect presumably represents a small stone (choledocholithiasis). There is no biliary duct dilatation. 2. Normal pancreas. 3. Postsurgical change consistent recent cholecystectomy. 4. 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  Procedures Dr. Donnie Mesa (07/30/20) - Laparoscopic Cholecystectomy with IOC  Dr. Ardis Hughs (08/01/2020) - ERCP   Hospital Course:  79 y/o F with a PMH breast cancer, HTN, HLD, who presented to the ED with a cc RUQ pain after eating.  Workup showed acute cholecystitis.  Patient was admitted and underwent procedure listed above. Noted to have +IOC intra-op. GI was consulted. They obtained an MRCP that was positive for Choledocholithiasis. Patient underwent ERCP on 10/27 as noted above. Patients diet was advanced and tolerated.  On POD 3, the patient was voiding well, tolerating diet, ambulating well, pain well controlled, vital signs stable, incisions c/d/i and felt stable for discharge  home.  Follow up as noted below.  Physical Exam:  Gen:  Alert, NAD, pleasant Card:  RRR Pulm:  CTAB, no W/R/R, effort normal Abd: Soft, ND, NT +BS, steri-strips in place c/d/i Ext:  No LE edema  Psych: A&Ox3  Skin: no rashes noted, warm and dry  Allergies as of 08/02/2020   No Known Allergies     Medication List    STOP taking these medications   DAYQUIL/NYQUIL COLD/FLU RELIEF PO     TAKE these medications   alendronate 70 MG tablet Commonly known as: FOSAMAX TAKE 1 TABLET BY MOUTH ONCE A WEEK ON AN EMPTY STOMACH WITH  FULL  GLASS  OF  WATER What changed: See the new instructions.   cholecalciferol 1000 units tablet Commonly known as: VITAMIN D Take 2,000 Units by mouth daily.   CYANOCOBALAMIN PO Take 1 tablet by mouth daily.   diclofenac sodium 1 % Gel Commonly known as: VOLTAREN Apply 4 g topically 4 (four) times daily. To affected joint.   multivitamin tablet Take 1 tablet by mouth daily.   oxyCODONE 5 MG immediate release tablet Commonly known as: Oxy IR/ROXICODONE Take 1 tablet (5 mg total) by mouth every 6 (six) hours as needed for breakthrough pain.   pantoprazole 40 MG tablet Commonly known as: Protonix Take 1 tablet (40 mg total) by mouth daily.   pyridoxine 100 MG tablet Commonly known as: B-6 Take 100 mg by mouth daily.   senna-docusate 8.6-50 MG tablet Commonly known as: Senokot-S Take 1 tablet by mouth at bedtime as needed for mild constipation.        Follow-up Information    [facility] [place]. Go on 08/16/2020.   Specialty: General Surgery Why: post-operative follow up at 1:30 pm. Please bring a  copy of your photo ID and insurance card. Please arrive 30 minutes prior to your appointment for paperwork.  Contact information: [address] [place] [address] [phone]        [name], MD. Schedule an appointment as soon as possible for a visit.   Specialty: Family Medicine Why: For follow up. Your  blood sugars were noted to be elevated during admission.  Contact information: [place] [address] [phone]        Milus Banister, MD. Call.   Specialty: Gastroenterology Why: Please call to schedule a follow up with gastroenterology for follow up of your ERCP. They would like an xray of your abdomen in 10 days.  Contact information: [address] [place] [address] [phone]               Signed: Alferd Apa, [facility] 08/02/2020, 9:47 AM

## 2020-07-30 NOTE — Anesthesia Procedure Notes (Signed)
Procedure Name: Intubation Date/Time: 07/30/2020 9:20 AM Performed by: Lance Coon, CRNA Pre-anesthesia Checklist: Patient identified, Emergency Drugs available, Suction available, Patient being monitored and Timeout performed Patient Re-evaluated:Patient Re-evaluated prior to induction Oxygen Delivery Method: Circle system utilized Preoxygenation: Pre-oxygenation with 100% oxygen Induction Type: IV induction, Rapid sequence and Cricoid Pressure applied Laryngoscope Size: Miller and 2 Grade View: Grade I Tube type: Oral Tube size: 7.0 mm Number of attempts: 1 Airway Equipment and Method: Stylet Placement Confirmation: ETT inserted through vocal cords under direct vision,  positive ETCO2 and breath sounds checked- equal and bilateral Secured at: 21 cm Tube secured with: Tape Dental Injury: Teeth and Oropharynx as per pre-operative assessment

## 2020-07-30 NOTE — Anesthesia Preprocedure Evaluation (Signed)
Anesthesia Evaluation  Patient identified by MRN, date of birth, ID band Patient awake    Reviewed: Allergy & Precautions, NPO status , Patient's Chart, lab work & pertinent test results  Airway Mallampati: II  TM Distance: >3 FB Neck ROM: Full    Dental no notable dental hx.    Pulmonary neg pulmonary ROS,    Pulmonary exam normal breath sounds clear to auscultation       Cardiovascular hypertension, Normal cardiovascular exam Rhythm:Regular Rate:Normal     Neuro/Psych negative neurological ROS  negative psych ROS   GI/Hepatic negative GI ROS, Neg liver ROS,   Endo/Other  negative endocrine ROS  Renal/GU negative Renal ROS  negative genitourinary   Musculoskeletal negative musculoskeletal ROS (+)   Abdominal   Peds negative pediatric ROS (+)  Hematology negative hematology ROS (+)   Anesthesia Other Findings   Reproductive/Obstetrics negative OB ROS                             Anesthesia Physical Anesthesia Plan  ASA: II  Anesthesia Plan: General   Post-op Pain Management:    Induction: Intravenous  PONV Risk Score and Plan: 3 and Ondansetron, Dexamethasone and Treatment may vary due to age or medical condition  Airway Management Planned: Oral ETT  Additional Equipment:   Intra-op Plan:   Post-operative Plan: Extubation in OR  Informed Consent: I have reviewed the patients History and Physical, chart, labs and discussed the procedure including the risks, benefits and alternatives for the proposed anesthesia with the patient or authorized representative who has indicated his/her understanding and acceptance.     Dental advisory given  Plan Discussed with: CRNA and Surgeon  Anesthesia Plan Comments:         Anesthesia Quick Evaluation

## 2020-07-30 NOTE — Progress Notes (Signed)
Day of Surgery   Subjective/Chief Complaint: Patient is comfortable T. Bili is elevated today along with AST/ALT    Objective: Vital signs in last 24 hours: Temp:  [97.8 F (36.6 C)-98.3 F (36.8 C)] 97.9 F (36.6 C) (10/25 0714) Pulse Rate:  [73-86] 79 (10/25 0714) Resp:  [16-20] 16 (10/25 0714) BP: (123-171)/(48-102) 157/83 (10/25 0714) SpO2:  [97 %-100 %] 98 % (10/25 0714) Last BM Date: 07/29/20  Intake/Output from previous day: 10/24 0701 - 10/25 0700 In: 893.8 [I.V.:300; IV Piggyback:593.8] Out: -  Intake/Output this shift: No intake/output data recorded.  General appearance - alert, well appearing, and in no distress Mental status - alert, oriented to person, place, and time HEENT: without lesion, anicteric sclera  Neck: supple, without appreciable lymphadenopathy Chest - breathing comfortably on room air, without respiratory distress Abdomen - soft, RUQ tender, nondistended  Extremities - no edema Lymphatic: no lymphadenopathy in the neck or groin Neuro: cranial nerves grossly intact.  Sensation intact to light touch diffusely. Psych: appropriate mood and affect, normal insight/judgment intact  Skin: warm and dry  Lab Results:  Recent Labs    07/29/20 1913 07/30/20 0654  WBC 13.4* 10.4  HGB 13.6 13.0  HCT 42.8 40.3  PLT 180 161   BMET Recent Labs    07/29/20 1913 07/30/20 0654  NA 139 136  K 4.0 3.8  CL 101 102  CO2 26 23  GLUCOSE 181* 206*  BUN 14 11  CREATININE 1.02* 0.86  CALCIUM 10.0 9.1   Hepatic Function Latest Ref Rng & Units 07/30/2020 07/29/2020 07/13/2020  Total Protein 6.5 - 8.1 g/dL 6.9 7.5 6.6  Albumin 3.5 - 5.0 g/dL 3.5 4.0 -  AST 15 - 41 U/L 1,315(H) 17 12  ALT 0 - 44 U/L 944(H) 16 12  Alk Phosphatase 38 - 126 U/L 107 51 -  Total Bilirubin 0.3 - 1.2 mg/dL 2.9(H) 0.8 0.4  Bilirubin, Direct 0.0 - 0.3 mg/dL - - -    PT/INR No results for input(s): LABPROT, INR in the last 72 hours. ABG No results for input(s): PHART, HCO3  in the last 72 hours.  Invalid input(s): PCO2, PO2  Studies/Results: No results found.  Anti-infectives: Anti-infectives (From admission, onward)   Start     Dose/Rate Route Frequency Ordered Stop   07/29/20 2230  [MAR Hold]  cefTRIAXone (ROCEPHIN) 2 g in sodium chloride 0.9 % 100 mL IVPB        (MAR Hold since Mon 07/30/2020 at 0826.Hold Reason: Transfer to a Procedural area.)   2 g 200 mL/hr over 30 Minutes Intravenous Every 24 hours 07/29/20 2225        Assessment/Plan: Acute cholecystitis with possible choledocholithiasis  Proceed with laparoscopic cholecystectomy with IOC today.  If IOC positive, may need ERCP.    The surgical procedure has been discussed with the patient.  Potential risks, benefits, alternative treatments, and expected outcomes have been explained.  All of the patient's questions at this time have been answered.  The likelihood of reaching the patient's treatment goal is good.  The patient understand the proposed surgical procedure and wishes to proceed.   LOS: 0 days    Briana Smith 07/30/2020

## 2020-07-30 NOTE — Consult Note (Addendum)
[facility] Consult: 11:12 AM 07/30/2020  LOS: 0 days    Referring Provider: Dr Georgette Dover  Primary Care Physician:  [name], MD Primary Gastroenterologist:  Dr. Thomasenia Sales in past    Reason for Consultation: ?Obstruction at distal CBD on IOC.     HPI: Briana Smith is a 79 y.o. female.  PMH below.    08/2008 Colonoscopy.  Average risk screening study.  Colon polyps (hyperplastic).  A few sigmoid diverticulae.    Recent problems with recurrent RUQ pain postprandial/fatty meals. Presented to the ED on 10/24.  07/20/2020 abdominal ultrasound with suboptimal visualization of gallbladder due to extensive shadowing consistent with numerous stones.  1.8 cm hemangioma right lobe liver, small cyst near GB fossa.  Hepatic heterogeneous, nonspecific LFTs of 10/8, 10/24 were WNL.  Lipase 18, 33. LFTs of 10/25 : T bili 2.9, alkaline phosphatase 107, AST/ALT 1315/944 WBCs 13.4 >> 10.4.  Went for lap chole this AM.  At [facility] proximal biliary tree visualized but contrast did not easily pass into the duodenum and could not exclude distal choledocholithiasis.  Past Medical History:  Diagnosis Date  . Breast cancer ([place])   . Cancer [facility])    breast cancer- left  . Heart murmur    years ago. no recent issues  . Hyperlipidemia   . Hypertension   . Osteoporosis 01/20/2014   (5) DEXA scan 08/31/2013 shows osteopenia with a T score of -2.0; zolendronate started April 2015, to be repeated every 6 months while on anastrozole.  2016 dexa stable. Repeat 2019  . Personal history of chemotherapy   . Personal history of radiation therapy     Past Surgical History:  Procedure Laterality Date  . ABDOMINAL HYSTERECTOMY  1987   fibroids  . BREAST BIOPSY    . BREAST LUMPECTOMY  10/23/10   left  . PORT-A-CATH  REMOVAL  09/02/2011   Procedure: REMOVAL PORT-A-CATH;  Surgeon: Adin Hector, MD;  Location: [place];  Service: General;  Laterality: Right;  . TOTAL HIP ARTHROPLASTY Left 04/03/2017   Procedure: IRRIGATION AND DEBRIDIMENT LEFT HIP;  Surgeon: Rod Can, MD;  Location[facility];  Service: Orthopedics;  Laterality: Left;    Prior to Admission medications   Medication Sig Start Date End Date Taking? Authorizing Provider  alendronate (FOSAMAX) 70 MG tablet TAKE 1 TABLET BY MOUTH ONCE A WEEK ON AN EMPTY STOMACH WITH  FULL  GLASS  OF  WATER Patient taking differently: Take 70 mg by mouth once a week.  06/27/20  Yes [name], MD  cholecalciferol (VITAMIN D) 1000 units tablet Take 2,000 Units by mouth daily.   Yes [provider]  CYANOCOBALAMIN PO Take 1 tablet by mouth daily.    Yes [provider]  diclofenac sodium (VOLTAREN) 1 % GEL Apply 4 g topically 4 (four) times daily. To affected joint. 08/09/19  Yes Gregor Hams, MD  Multiple Vitamin (MULTIVITAMIN) tablet Take 1 tablet by mouth daily.     Yes [provider]  pantoprazole (PROTONIX) 40 MG tablet Take  1 tablet (40 mg total) by mouth daily. 07/13/20  Yes Vivi Barrack, MD  pyridoxine (B-6) 100 MG tablet Take 100 mg by mouth daily.    Yes [provider]  senna-docusate (SENOKOT-S) 8.6-50 MG tablet Take 1 tablet by mouth at bedtime as needed for mild constipation. 04/06/17  Yes Florencia Reasons, MD  Pseudoeph-Doxylamine-DM-APAP (DAYQUIL/NYQUIL COLD/FLU RELIEF PO) Take by mouth.     [provider]    Scheduled Meds: . [MAR Hold] acetaminophen  1,000 mg Oral Q6H  . chlorhexidine  15 mL Mouth/Throat Once  . [MAR Hold] docusate sodium  100 mg Oral BID  . indocyanine green  1.25 mg Intravenous Once  . [MAR Hold] insulin aspart  0-9 Units Subcutaneous TID WC  . [MAR Hold] mupirocin ointment  1 application Nasal BID  . [MAR Hold] pantoprazole (PROTONIX) IV  40 mg Intravenous  QHS   Infusions: . [MAR Hold] cefTRIAXone (ROCEPHIN)  IV Stopped (07/29/20 2333)  . dextrose 5 % and 0.45% NaCl 75 mL/hr at 07/29/20 2310  . lactated ringers Stopped (07/30/20 1044)   PRN Meds: fentaNYL (SUBLIMAZE) injection, [MAR Hold]  HYDROmorphone (DILAUDID) injection, [MAR Hold] ondansetron **OR** [MAR Hold] ondansetron (ZOFRAN) IV, ondansetron (ZOFRAN) IV, oxyCODONE **OR** oxyCODONE   Allergies as of 07/29/2020  . (No Known Allergies)    Family History  Problem Relation Age of Onset  . COPD Father   . Diabetes Mother   . Dementia Mother   . Kidney disease Mother        not on dialysis  . Cancer Brother        jaw  . Hypertension Brother   . Breast cancer Paternal Aunt     Social History   Socioeconomic History  . Marital status: Married    Spouse name: Not on file  . Number of children: Not on file  . Years of education: Not on file  . Highest education level: Not on file  Occupational History  . Not on file  Tobacco Use  . Smoking status: Never Smoker  . Smokeless tobacco: Never Used  Substance and Sexual Activity  . Alcohol use: No  . Drug use: No  . Sexual activity: Not on file  Other Topics Concern  . Not on file  Social History Narrative   Married. Lives with husband. 3 children. 3 grandkids (22, 22, 18 in 2018).       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       Hobbies: working puzzles, reading, shoping, beach   Social Determinants of Health   Financial Resource Strain:   . Difficulty of Paying Living Expenses: Not on file  Food Insecurity:   . Worried About Charity fundraiser in the Last Year: Not on file  . Ran Out of Food in the Last Year: Not on file  Transportation Needs:   . Lack of Transportation (Medical): Not on file  . Lack of Transportation (Non-Medical): Not on file  Physical Activity: Insufficiently Active  . Days of Exercise per Week: 3 days  . Minutes of Exercise per Session: 30 min  Stress:   . Feeling of  Stress : Not on file  Social Connections:   . Frequency of Communication with Friends and Family: Not on file  . Frequency of Social Gatherings with Friends and Family: Not on file  . Attends Religious Services: Not on file  . Active Member of Clubs or Organizations: Not on file  . Attends Archivist  Meetings: Not on file  . Marital Status: Not on file  Intimate Partner Violence:   . Fear of Current or Ex-Partner: Not on file  . Emotionally Abused: Not on file  . Physically Abused: Not on file  . Sexually Abused: Not on file    REVIEW OF SYSTEMS: Constitutional: no weakness or fatigue ENT:  No nose bleeds Pulm:  No SOB or cough CV:  No palpitations, no LE edema.  GU:  No hematuria, no frequency GI:  Per HPI. Heme:  No unusual bleeding or bruising   Transfusions:  none Neuro:  No headaches, no peripheral tingling or numbness Derm:  No itching, no rash or sores.  Endocrine:  No sweats or chills.  No polyuria or dysuria Immunization:  Up to date on multiple vax, including her [place] vax  Travel:  None beyond local counties in last few months.    PHYSICAL EXAM: Vital signs in last 24 hours: Vitals:   07/30/20 1045 07/30/20 1100  BP: (!) 142/72 139/67  Pulse: 78 73  Resp: 17 13  Temp: 98 F (36.7 C)   SpO2: 90% 95%   Wt Readings from Last 3 Encounters:  07/13/20 79.5 kg  01/25/20 80.1 kg  12/22/19 81.6 kg    General: pleasant.  Looks a little tired but not ill.  comfortable Head:  No asymmetry or swelling.  No trauma  Eyes:  No icterus or pallor Ears:  Not HOH  Nose:  No discharge or congestion.   Mouth:  Moist, pink clear MM.  Tongue midline Neck:  No JVD,  No mass.  No  TMG Lungs:  Clear bil.  No cough or dyspnea.   Heart: RRR.  No mrg.  S1, s2 present Abdomen:  Soft, BS scant.  Slight distended, not tense.  Tender on right.  Surgery incisions intact   Rectal: deferred   Musc/Skeltl: no joint redness or swelling or gross  deformity Extremities:  No CCE  Neurologic:  Oriented x 3.  Appropriate.  No gross deficits, no tremors.    Skin:  No jaundice, no rash Tattoos:  none Nodes:  No cervical adenopathy.    Psych:  Pleasant, calm, alert.    Intake/Output from previous day: 10/24 0701 - 10/25 0700 In: 893.8 [I.V.:300; IV Piggyback:593.8] Out: -  Intake/Output this shift: Total I/O In: 1700 [I.V.:1700] Out: 10 [Blood:10]  LAB RESULTS: Recent Labs    07/29/20 1913 07/30/20 0654  WBC 13.4* 10.4  HGB 13.6 13.0  HCT 42.8 40.3  PLT 180 161   BMET Lab Results  Component Value Date   NA 136 07/30/2020   NA 139 07/29/2020   NA 140 07/13/2020   K 3.8 07/30/2020   K 4.0 07/29/2020   K 4.8 07/13/2020   CL 102 07/30/2020   CL 101 07/29/2020   CL 105 07/13/2020   CO2 23 07/30/2020   CO2 26 07/29/2020   CO2 29 07/13/2020   GLUCOSE 206 (H) 07/30/2020   GLUCOSE 181 (H) 07/29/2020   GLUCOSE 94 07/13/2020   BUN 11 07/30/2020   BUN 14 07/29/2020   BUN 20 07/13/2020   CREATININE 0.86 07/30/2020   CREATININE 1.02 (H) 07/29/2020   CREATININE 0.88 07/13/2020   CALCIUM 9.1 07/30/2020   CALCIUM 10.0 07/29/2020   CALCIUM 9.4 07/13/2020   LFT Recent Labs    07/29/20 1913 07/30/20 0654  PROT 7.5 6.9  ALBUMIN 4.0 3.5  AST 17 1,315*  ALT 16 944*  ALKPHOS 51 107  BILITOT 0.8 2.9*   PT/INR Lab Results  Component Value Date   INR 0.98 03/28/2017   INR 1.04 03/20/2015   INR 1.0 ratio 09/03/2010   Hepatitis Panel No results for input(s): HEPBSAG, HCVAB, HEPAIGM, HEPBIGM in the last 72 hours. C-Diff No components found for: CDIFF Lipase     Component Value Date/Time   LIPASE 33 07/29/2020 1913    Drugs of Abuse  No results found for: LABOPIA, COCAINSCRNUR, LABBENZ, AMPHETMU, THCU, LABBARB   RADIOLOGY STUDIES: DG Cholangiogram Operative  Result Date: 07/30/2020 CLINICAL DATA:  Cholelithiasis EXAM: INTRAOPERATIVE CHOLANGIOGRAM TECHNIQUE: Cholangiographic images from the C-arm  fluoroscopic device were submitted for interpretation post-operatively. Please see the procedural report for the amount of contrast and the fluoroscopy time utilized. COMPARISON:  07/20/2020 FINDINGS: Intraoperative cholangiogram performed during the laparoscopic procedure. The residual cystic duct, biliary confluence, common hepatic duct, and common bile duct or opacified. Contrast does not pass easily into the duodenum. Cannot exclude distal choledocholithiasis. IMPRESSION: Biliary tree is visualized to the distal CBD without opacification of the duodenum. Difficult to exclude choledocholithiasis. Electronically Signed   By: Jerilynn Mages.  Shick M.D.   On: 07/30/2020 10:43     IMPRESSION:   *  Acute cholecystitis, lap chole this AM.    *   Non-filling of distal CBD at [facility].  ? Edema related vsCBD stones.    PLAN:     *   Dr Carlean Purl has spoken w pt.  Dr Rush Landmark will review and possibly pursue EUS this afternoon.  If CBD stones, stricture etc will then proceed to ERCP.  If unable to fit pt in schedule for this then repeat LFTs in AM and possible MRCP tmrw.    NPO for now but if no procedure today, will order clears.     Azucena Freed  07/30/2020, 11:12 AM Phone [place] Attending   I have taken an interval history, reviewed the chart and examined the patient. I agree with the Advanced Practitioner's note, impression and recommendations.    Its not clear that there is a common bile duct stone.  She certainly could have passed 1 and has some edema.  I do not think there is enough objective information to go for ERCP just yet.  Plan as above.  Gatha Mayer, MD, [facility] 07/30/2020 1:44 PM

## 2020-07-30 NOTE — Transfer of Care (Signed)
Immediate Anesthesia Transfer of Care Note  Patient: Briana Smith  Procedure(s) Performed: LAPAROSCOPIC CHOLECYSTECTOMY WITH INTRAOPERATIVE CHOLANGIOGRAM AND ICG DYE (N/A Abdomen)  Patient Location: PACU  Anesthesia Type:General  Level of Consciousness: drowsy and patient cooperative  Airway & Oxygen Therapy: Patient Spontanous Breathing and Patient connected to face mask oxygen  Post-op Assessment: Report given to RN and Post -op Vital signs reviewed and stable  Post vital signs: Reviewed and stable  Last Vitals:  Vitals Value Taken Time  BP 142/72 07/30/20 1048  Temp    Pulse 78 07/30/20 1049  Resp 17 07/30/20 1049  SpO2 90 % 07/30/20 1049  Vitals shown include unvalidated device data.  Last Pain:  Vitals:   07/30/20 0754  TempSrc:   PainSc: 3       Patients Stated Pain Goal: 2 (66/06/30 1601)  Complications: No complications documented.

## 2020-07-30 NOTE — Progress Notes (Signed)
Patient arrived to floor from PACU. Denies any distress/pain at this time. Sister at bedside. Will continue with plan of care.

## 2020-07-31 ENCOUNTER — Inpatient Hospital Stay (HOSPITAL_COMMUNITY): Payer: Medicare HMO

## 2020-07-31 ENCOUNTER — Encounter (HOSPITAL_COMMUNITY): Payer: Self-pay | Admitting: Surgery

## 2020-07-31 LAB — GLUCOSE, CAPILLARY
Glucose-Capillary: 121 mg/dL — ABNORMAL HIGH (ref 70–99)
Glucose-Capillary: 84 mg/dL (ref 70–99)
Glucose-Capillary: 109 mg/dL — ABNORMAL HIGH (ref 70–99)
Glucose-Capillary: 100 mg/dL — ABNORMAL HIGH (ref 70–99)

## 2020-07-31 LAB — SURGICAL PATHOLOGY

## 2020-07-31 LAB — COMPREHENSIVE METABOLIC PANEL
ALT: 749 U/L — ABNORMAL HIGH (ref 0–44)
AST: 432 U/L — ABNORMAL HIGH (ref 15–41)
Albumin: 2.7 g/dL — ABNORMAL LOW (ref 3.5–5.0)
Alkaline Phosphatase: 126 U/L (ref 38–126)
Anion gap: 8 (ref 5–15)
BUN: 6 mg/dL — ABNORMAL LOW (ref 8–23)
CO2: 26 mmol/L (ref 22–32)
Calcium: 8.7 mg/dL — ABNORMAL LOW (ref 8.9–10.3)
Chloride: 107 mmol/L (ref 98–111)
Creatinine, Ser: 0.91 mg/dL (ref 0.44–1.00)
GFR, Estimated: >60 mL/min (ref >60)
Glucose, Bld: 141 mg/dL — ABNORMAL HIGH (ref 70–99)
Potassium: 3.6 mmol/L (ref 3.5–5.1)
Sodium: 141 mmol/L (ref 135–145)
Total Bilirubin: 3.1 mg/dL — ABNORMAL HIGH (ref 0.3–1.2)
Total Protein: 5.8 g/dL — ABNORMAL LOW (ref 6.5–8.1)

## 2020-07-31 LAB — HEMOGLOBIN A1C
Hgb A1c MFr Bld: 6.2 % — ABNORMAL HIGH (ref 4.8–5.6)
Mean Plasma Glucose: 131 mg/dL

## 2020-07-31 MED ORDER — GADOBUTROL 1 MMOL/ML IV SOLN
8.0000 mL | Freq: Once | INTRAVENOUS | Status: AC | PRN
  Administered 2020-07-31: 8 mL via INTRAVENOUS

## 2020-07-31 MED ORDER — HYDROMORPHONE HCL 1 MG/ML IJ SOLN: 1.0000 mg | INTRAMUSCULAR | Status: DC | PRN

## 2020-07-31 MED ORDER — OXYCODONE HCL 5 MG PO TABS: 5.0000 mg | ORAL_TABLET | ORAL | Status: DC | PRN

## 2020-07-31 NOTE — Progress Notes (Signed)
    Transaminases down, bili about the same  MRCP ordered  Gatha Mayer, MD, [facility] 07/31/2020 7:43 AM  [phone]

## 2020-07-31 NOTE — Progress Notes (Deleted)
Phone [phone]   Subjective:  Patient presents today for their annual physical. Chief complaint-noted.   See problem oriented charting- ROS- full  review of systems was completed and negative except for: ***  The following were reviewed and entered/updated in epic: Past Medical History:  Diagnosis Date  . Breast cancer ([place])   . Cancer [facility])    breast cancer- left  . Heart murmur    years ago. no recent issues  . Hyperlipidemia   . Hypertension   . Osteoporosis 01/20/2014   (5) DEXA scan 08/31/2013 shows osteopenia with a T score of -2.0; zolendronate started April 2015, to be repeated every 6 months while on anastrozole.  2016 dexa stable. Repeat 2019  . Personal history of chemotherapy   . Personal history of radiation therapy    Patient Active Problem List   Diagnosis Date Noted  . Abnormal cholangiogram   . Acute cholecystitis 07/29/2020  . Senile purpura ([place]) 01/25/2020  . History of septic arthritis 07/26/2018  . Cellulitis of left breast 03/28/2017  . Hyperlipidemia 02/02/2017  . Hyperglycemia 03/19/2015  . Osteoporosis 01/20/2014  . History of breast cancer 06/30/2013  . Essential hypertension 08/03/2008  . SKIN CANCER, HX OF 08/03/2008   Past Surgical History:  Procedure Laterality Date  . ABDOMINAL HYSTERECTOMY  1987   fibroids  . BREAST BIOPSY    . BREAST LUMPECTOMY  10/23/10   left  . CHOLECYSTECTOMY N/A 07/30/2020   Procedure: LAPAROSCOPIC CHOLECYSTECTOMY WITH INTRAOPERATIVE CHOLANGIOGRAM AND ICG DYE;  Surgeon: Donnie Mesa, MD;  Location: [place];  Service: General;  Laterality: N/A;  . PORT-A-CATH REMOVAL  09/02/2011   Procedure: REMOVAL PORT-A-CATH;  Surgeon: Adin Hector, MD;  Location: [place];  Service: General;  Laterality: Right;  . TOTAL HIP ARTHROPLASTY Left 04/03/2017   Procedure: IRRIGATION AND DEBRIDIMENT LEFT HIP;  Surgeon: Rod Can, MD;  [facility];  Service: Orthopedics;  Laterality: Left;     Family History  Problem Relation Age of Onset  . COPD Father   . Diabetes Mother   . Dementia Mother   . Kidney disease Mother        not on dialysis  . Cancer Brother        jaw  . Hypertension Brother   . Breast cancer Paternal Aunt     Medications- reviewed and updated No current facility-administered medications for this visit.   No current outpatient medications on file.   Facility-Administered Medications Ordered in Other Visits  Medication Dose Route Frequency Provider Last Rate Last Admin  . acetaminophen (TYLENOL) tablet 1,000 mg  1,000 mg Oral Q6H Eliot Ford, MD   1,000 mg at 07/31/20 0030  . cefTRIAXone (ROCEPHIN) 2 g in sodium chloride 0.9 % 100 mL IVPB  2 g Intravenous Q24H Eliot Ford, MD 200 mL/hr at 07/30/20 2120 2 g at 07/30/20 2120  . dextrose 5 %-0.45 % sodium chloride infusion   Intravenous Continuous Eliot Ford, MD 75 mL/hr at 07/30/20 1723 New Bag at 07/30/20 1723  . docusate sodium (COLACE) capsule 100 mg  100 mg Oral BID Eliot Ford, MD   100 mg at 07/30/20 2114  . HYDROmorphone (DILAUDID) injection 1 mg  1 mg Intravenous Q2H PRN Ralene Ok, MD   1 mg at 07/30/20 0719  . insulin aspart (novoLOG) injection 0-9 Units  0-9 Units Subcutaneous TID WC Eliot Ford, MD   3 Units at 07/30/20 1725  . mupirocin ointment (BACTROBAN) 2 % 1 application  1 application  Nasal BID Domenic Polite, MD   1 application at 56/21/30 [phone]  . ondansetron (ZOFRAN-ODT) disintegrating tablet 4 mg  4 mg Oral Q6H PRN Eliot Ford, MD       Or  . ondansetron [facility]) injection 4 mg  4 mg Intravenous Q6H PRN Eliot Ford, MD   4 mg at 07/30/20 0719  . pantoprazole (PROTONIX) injection 40 mg  40 mg Intravenous Shelda Pal, MD   40 mg at 07/30/20 2113    Allergies-reviewed and updated No Known Allergies  Social History   Social History Narrative   Married. Lives with husband. 3 children. 3 grandkids (22, 22, 18 in 2018).        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       Hobbies: working puzzles, reading, shoping, beach   Objective  Objective:  There were no vitals taken for this visit. Gen: NAD, resting comfortably HEENT: Mucous membranes are moist. Oropharynx normal Neck: no thyromegaly CV: RRR no murmurs rubs or gallops Lungs: CTAB no crackles, wheeze, rhonchi Abdomen: soft/nontender/nondistended/normal bowel sounds. No rebound or guarding.  Ext: no edema Skin: warm, dry Neuro: grossly normal, moves all extremities, PERRLA***   Assessment and Plan   79 y.o. female presenting for annual physical.  Health Maintenance counseling: 1. Anticipatory guidance: Patient counseled regarding regular dental exams ***q6 months, eye exams ***,  avoiding smoking and second hand smoke*** , limiting alcohol to 1 beverage per day*** .   2. Risk factor reduction:  Advised patient of need for regular exercise and diet rich and fruits and vegetables to reduce risk of heart attack and stroke. Exercise- ***. Diet-***.  Wt Readings from Last 3 Encounters:  07/13/20 175 lb 3.2 oz (79.5 kg)  01/25/20 176 lb 9.6 oz (80.1 kg)  12/22/19 179 lb 12.8 oz (81.6 kg)   3. Immunizations/screenings/ancillary studies Immunization History  Administered Date(s) Administered  . Fluad Quad(high Dose 65+) 07/28/2019, 07/13/2020  . Influenza Split 07/10/2011, 07/29/2012, 08/04/2013  . Influenza Whole 07/18/2009  . Influenza, High Dose Seasonal PF 08/15/2015, 07/02/2017, 07/26/2018  . Influenza-Unspecified 07/31/2014  . PFIZER SARS-COV-2 Vaccination 10/27/2019, 11/17/2019, 05/26/2020  . Pneumococcal Conjugate-13 11/15/2013  . Pneumococcal Polysaccharide-23 11/18/2005  . Td 08/02/2002  . Tdap 10/20/2012  . Zoster 02/21/2009  . Zoster Recombinat (Shingrix) 07/26/2018, 09/30/2018   There are no preventive care reminders to display for this patient. 4. Cervical cancer screening- *** 5. Breast cancer screening-  breast exam ***  and mammogram *** 6. Colon cancer screening - *** 7. Skin cancer screening- ***advised regular sunscreen use. Denies worrisome, changing, or new skin lesions.  8. Birth control/STD check- *** 9. Osteoporosis screening at 89- *** -*** smoker  Status of chronic or acute concerns   #History of breast cancer 2012 -treated with lumpectomy and chemotherapy.  Completed 5 years of antiestrogen therapy 12/24/2015.  Continues yearly mammograms***   #hypertension S: in 2020 and 2021 BP trending down- has been able to come off BP medications. Most recently lisinopril stopped before 01/25/20 visit  Home #s: *** A/P: ***   #hyperlipidemia S: compliant with ***rosuvastatin 20 mg twice a week.  Cautious about increasing due to prediabetes. wants to trial off 01/25/20 due to arthralgias  A/P: ***    # Hyperglycemia/insulin resistance/prediabetes-peak A1c 6.2 S: Medication: *** none  Exercise and diet- *** Lab Results Component Value Date  HGBA1C 6.2 07/28/2019  HGBA1C 6.1 07/26/2018  HGBA1C 6.1 07/02/2017  A/P: ***   #Osteoporosis S: Compliant with Fosamax weekly*** .  She had been on Reclast in the past when on anastrozole.  Last bone density with worst T score -2.7 in October 2019.  Patient is compliant with calcium and vitamin D***   A/P: ***  *** No diagnosis found.  Recommended follow up: ***No follow-ups on file. Future Appointments  Date Time Provider [place]  08/01/2020  9:20 AM [name], MD [facility]  11/26/2020  1:00 PM [facility] PHARMACIST [facility]    No chief complaint on file.  Lab/Order associations:*** fasting No diagnosis found.  No orders of the defined types were placed in this encounter.   Return precautions advised.  Clyde Lundborg, CMA

## 2020-07-31 NOTE — H&P (View-Only) (Signed)
Has CBD stone on MRCP Trying to arrange ERCP for tmrw but schedule is tight.   Risks, benefits of procedure d/w pt.   Allow carb mod diet for rest of today.    Azucena Freed PA C  Anticipating ERCP with Dr. Ardis Hughs at 1300 10/27 Gatha Mayer, MD, [facility] 07/31/2020 4:00 PM

## 2020-07-31 NOTE — Progress Notes (Addendum)
1 Day Post-Op  Subjective: CC: Patient reports no abdominal pain, n/v. She tolerated clears yesterday. Currently NPO as she was supposed to have ERCP today. Awaiting MRCP. Mobilizing in the room without difficulty. Voiding without difficulty. Patients daughter Maudie Mercury was in the room.   Objective: Vital signs in last 24 hours: Temp:  [97.8 F (36.6 C)-98.5 F (36.9 C)] 98.4 F (36.9 C) (10/26 0720) Pulse Rate:  [64-81] 81 (10/26 0720) Resp:  [12-17] 13 (10/26 0720) BP: (117-152)/(48-72) 152/62 (10/26 0720) SpO2:  [90 %-95 %] 95 % (10/26 0720) Last BM Date: 07/29/20  Intake/Output from previous day: 10/25 0701 - 10/26 0700 In: 1936.6 [I.V.:1936.6] Out: 10 [Blood:10] Intake/Output this shift: No intake/output data recorded.  PE: Gen:  Alert, NAD, pleasant HEENT: EOM's intact, pupils equal and round Card:  RRR Pulm:  CTAB, no W/R/R, effort normal Abd: Soft, NT/ND, +BS, Incisions with gauze and tegaderm intact that are without drainage, bleeding, or signs of infection Ext:  No LE edema Psych: A&Ox3 Skin: no rashes noted, warm and dry  Lab Results:  Recent Labs    07/29/20 1913 07/30/20 0654  WBC 13.4* 10.4  HGB 13.6 13.0  HCT 42.8 40.3  PLT 180 161   BMET Recent Labs    07/30/20 0654 07/31/20 0413  NA 136 141  K 3.8 3.6  CL 102 107  CO2 23 26  GLUCOSE 206* 141*  BUN 11 6*  CREATININE 0.86 0.91  CALCIUM 9.1 8.7*   PT/INR No results for input(s): LABPROT, INR in the last 72 hours. CMP     Component Value Date/Time   NA 141 07/31/2020 0413   NA 141 01/31/2016 1002   K 3.6 07/31/2020 0413   K 4.8 01/31/2016 1002   CL 107 07/31/2020 0413   CL 104 01/13/2013 1101   CO2 26 07/31/2020 0413   CO2 28 01/31/2016 1002   GLUCOSE 141 (H) 07/31/2020 0413   GLUCOSE 62 (L) 01/31/2016 1002   GLUCOSE 103 (H) 01/13/2013 1101   BUN 6 (L) 07/31/2020 0413   BUN 17.1 01/31/2016 1002   CREATININE 0.91 07/31/2020 0413   CREATININE 0.88 07/13/2020 0904   CREATININE  0.9 01/31/2016 1002   CALCIUM 8.7 (L) 07/31/2020 0413   CALCIUM 9.5 01/31/2016 1002   PROT 5.8 (L) 07/31/2020 0413   PROT 7.0 01/31/2016 1002   ALBUMIN 2.7 (L) 07/31/2020 0413   ALBUMIN 3.7 01/31/2016 1002   AST 432 (H) 07/31/2020 0413   AST 15 01/31/2016 1002   ALT 749 (H) 07/31/2020 0413   ALT 17 01/31/2016 1002   ALKPHOS 126 07/31/2020 0413   ALKPHOS 45 01/31/2016 1002   BILITOT 3.1 (H) 07/31/2020 0413   BILITOT 0.33 01/31/2016 1002   GFRNONAA >60 07/31/2020 0413   GFRAA >60 04/06/2017 0329   Lipase     Component Value Date/Time   LIPASE 33 07/29/2020 1913       Studies/Results: DG Cholangiogram Operative  Result Date: 07/30/2020 CLINICAL DATA:  Cholelithiasis EXAM: INTRAOPERATIVE CHOLANGIOGRAM TECHNIQUE: Cholangiographic images from the C-arm fluoroscopic device were submitted for interpretation post-operatively. Please see the procedural report for the amount of contrast and the fluoroscopy time utilized. COMPARISON:  07/20/2020 FINDINGS: Intraoperative cholangiogram performed during the laparoscopic procedure. The residual cystic duct, biliary confluence, common hepatic duct, and common bile duct or opacified. Contrast does not pass easily into the duodenum. Cannot exclude distal choledocholithiasis. IMPRESSION: Biliary tree is visualized to the distal CBD without opacification of the duodenum. Difficult to exclude choledocholithiasis.  Electronically Signed   By: Jerilynn Mages.  Shick M.D.   On: 07/30/2020 10:43    Anti-infectives: Anti-infectives (From admission, onward)   Start     Dose/Rate Route Frequency Ordered Stop   07/29/20 2230  cefTRIAXone (ROCEPHIN) 2 g in sodium chloride 0.9 % 100 mL IVPB        2 g 200 mL/hr over 30 Minutes Intravenous Every 24 hours 07/29/20 2225         Assessment/Plan HTN HLD Remote hx of breast cancer Hyperglycemia - some CBG's in the 200's. A1c 6.2 c/w pre-diabetes. SSI for now. F/u PCP  Acute Cholecystitis  - s/p Laparoscopic  Cholecystectomy with IOC - Dr. Georgette Dover - 07/30/2020 - POD #1 - IOC positive. GI consulted. They have ordered a MRCP today after reviewing today's labs - Mobilize - Pulm toilet  FEN - NPO  VTE - SCDs, Lovenox  ID - Rocephin     LOS: 1 day    Jillyn Ledger , [facility] 07/31/2020, 9:23 AM Please see Amion for pager number during day hours 7:00am-4:30pm

## 2020-07-31 NOTE — Progress Notes (Addendum)
Has CBD stone on MRCP Trying to arrange ERCP for tmrw but schedule is tight.   Risks, benefits of procedure d/w pt.   Allow carb mod diet for rest of today.    Azucena Freed PA C  Anticipating ERCP with Dr. Ardis Hughs at 1300 10/27 Gatha Mayer, MD, [facility] 07/31/2020 4:00 PM

## 2020-08-01 ENCOUNTER — Inpatient Hospital Stay (HOSPITAL_COMMUNITY): Payer: Medicare HMO | Admitting: Certified Registered"

## 2020-08-01 ENCOUNTER — Encounter (HOSPITAL_COMMUNITY): Payer: Self-pay

## 2020-08-01 ENCOUNTER — Encounter (HOSPITAL_COMMUNITY): Admission: EM | Disposition: A | Discharge status: Home / Self Care | Payer: Self-pay | Source: Home / Self Care

## 2020-08-01 ENCOUNTER — Inpatient Hospital Stay (HOSPITAL_COMMUNITY): Payer: Medicare HMO

## 2020-08-01 ENCOUNTER — Encounter: Payer: Medicare HMO | Admitting: Family Medicine

## 2020-08-01 DIAGNOSIS — K8050 Calculus of bile duct without cholangitis or cholecystitis without obstruction: Secondary | ICD-10-CM

## 2020-08-01 HISTORY — PX: SPHINCTEROTOMY: SHX5544

## 2020-08-01 HISTORY — PX: ENDOSCOPIC RETROGRADE CHOLANGIOPANCREATOGRAPHY (ERCP) WITH PROPOFOL: SHX5810

## 2020-08-01 HISTORY — PX: REMOVAL OF STONES: SHX5545

## 2020-08-01 HISTORY — PX: PANCREATIC STENT PLACEMENT: SHX5539

## 2020-08-01 LAB — GLUCOSE, CAPILLARY
Glucose-Capillary: 115 mg/dL — ABNORMAL HIGH (ref 70–99)
Glucose-Capillary: 115 mg/dL — ABNORMAL HIGH (ref 70–99)
Glucose-Capillary: 174 mg/dL — ABNORMAL HIGH (ref 70–99)
Glucose-Capillary: 206 mg/dL — ABNORMAL HIGH (ref 70–99)

## 2020-08-01 SURGERY — ENDOSCOPIC RETROGRADE CHOLANGIOPANCREATOGRAPHY (ERCP) WITH PROPOFOL
Anesthesia: General

## 2020-08-01 MED ORDER — SUGAMMADEX SODIUM 200 MG/2ML IV SOLN
INTRAVENOUS | Status: DC | PRN
  Administered 2020-08-01: 200 mg via INTRAVENOUS

## 2020-08-01 MED ORDER — DEXAMETHASONE SODIUM PHOSPHATE 10 MG/ML IJ SOLN
INTRAMUSCULAR | Status: DC | PRN
  Administered 2020-08-01: 5 mg via INTRAVENOUS

## 2020-08-01 MED ORDER — INDOMETHACIN 50 MG RE SUPP
RECTAL | Status: DC | PRN
  Administered 2020-08-01: 100 mg via RECTAL

## 2020-08-01 MED ORDER — PHENYLEPHRINE 40 MCG/ML (10ML) SYRINGE FOR IV PUSH (FOR BLOOD PRESSURE SUPPORT)
PREFILLED_SYRINGE | INTRAVENOUS | Status: DC | PRN
  Administered 2020-08-01: 80 ug via INTRAVENOUS

## 2020-08-01 MED ORDER — LIDOCAINE 2% (20 MG/ML) 5 ML SYRINGE
INTRAMUSCULAR | Status: DC | PRN
  Administered 2020-08-01: 60 mg via INTRAVENOUS

## 2020-08-01 MED ORDER — INDOMETHACIN 50 MG RE SUPP
RECTAL | Status: AC
  Filled 2020-08-01: qty 2

## 2020-08-01 MED ORDER — CIPROFLOXACIN IN D5W 400 MG/200ML IV SOLN
INTRAVENOUS | Status: AC
  Filled 2020-08-01: qty 200

## 2020-08-01 MED ORDER — ROCURONIUM BROMIDE 10 MG/ML (PF) SYRINGE
PREFILLED_SYRINGE | INTRAVENOUS | Status: DC | PRN
  Administered 2020-08-01: 50 mg via INTRAVENOUS

## 2020-08-01 MED ORDER — PROPOFOL 10 MG/ML IV BOLUS
INTRAVENOUS | Status: DC | PRN
  Administered 2020-08-01: 150 mg via INTRAVENOUS

## 2020-08-01 MED ORDER — LACTATED RINGERS IV SOLN: INTRAVENOUS | Status: DC | PRN

## 2020-08-01 MED ORDER — TRAMADOL HCL 50 MG PO TABS
50.0000 mg | ORAL_TABLET | Freq: Four times a day (QID) | ORAL | Status: DC | PRN
  Administered 2020-08-01: 50 mg via ORAL
  Filled 2020-08-01: qty 1

## 2020-08-01 MED ORDER — FENTANYL CITRATE (PF) 250 MCG/5ML IJ SOLN
INTRAMUSCULAR | Status: DC | PRN
  Administered 2020-08-01 (×2): 50 ug via INTRAVENOUS

## 2020-08-01 MED ORDER — FENTANYL CITRATE (PF) 100 MCG/2ML IJ SOLN
INTRAMUSCULAR | Status: AC
  Filled 2020-08-01: qty 2

## 2020-08-01 MED ORDER — ENOXAPARIN SODIUM 40 MG/0.4ML ~~LOC~~ SOLN
40.0000 mg | Freq: Every day | SUBCUTANEOUS | Status: DC
  Administered 2020-08-02: 40 mg via SUBCUTANEOUS
  Filled 2020-08-01: qty 0.4

## 2020-08-01 MED ORDER — SODIUM CHLORIDE 0.9 % IV SOLN
INTRAVENOUS | Status: DC | PRN
  Administered 2020-08-01: 30 mL

## 2020-08-01 NOTE — Progress Notes (Signed)
2 Days Post-Op  Subjective: CC: Pain overall controlled. Denies nausea, vomiting. +flatus, denies BM. No reported CP or urinary sxs.   Objective: Vital signs in last 24 hours: Temp:  [97.3 F (36.3 C)-98.5 F (36.9 C)] 98.2 F (36.8 C) (10/27 0820) Pulse Rate:  [72-74] 74 (10/27 0820) Resp:  [12-18] 18 (10/27 0820) BP: (123-159)/(66-78) 159/70 (10/27 0820) SpO2:  [91 %-98 %] 91 % (10/27 0820) Last BM Date: 07/29/20  Intake/Output from previous day: 10/26 0701 - 10/27 0700 In: 1365.7 [I.V.:1265.7; IV Piggyback:100] Out: -  Intake/Output this shift: No intake/output data recorded.  PE: Gen:  Alert, NAD, pleasant HEENT: EOM's intact, pupils equal and round Card:  RRR Pulm:  CTAB, no W/R/R, effort normal Abd: Soft, NT/ND, +BS, Incisions with gauze and tegaderm intact that are without drainage, bleeding, or signs of infection Ext:  No LE edema Psych: A&Ox3 Skin: no rashes noted, warm and dry  Lab Results:  Recent Labs    07/29/20 1913 07/30/20 0654  WBC 13.4* 10.4  HGB 13.6 13.0  HCT 42.8 40.3  PLT 180 161   BMET Recent Labs    07/30/20 0654 07/31/20 0413  NA 136 141  K 3.8 3.6  CL 102 107  CO2 23 26  GLUCOSE 206* 141*  BUN 11 6*  CREATININE 0.86 0.91  CALCIUM 9.1 8.7*   PT/INR No results for input(s): LABPROT, INR in the last 72 hours. CMP     Component Value Date/Time   NA 141 07/31/2020 0413   NA 141 01/31/2016 1002   K 3.6 07/31/2020 0413   K 4.8 01/31/2016 1002   CL 107 07/31/2020 0413   CL 104 01/13/2013 1101   CO2 26 07/31/2020 0413   CO2 28 01/31/2016 1002   GLUCOSE 141 (H) 07/31/2020 0413   GLUCOSE 62 (L) 01/31/2016 1002   GLUCOSE 103 (H) 01/13/2013 1101   BUN 6 (L) 07/31/2020 0413   BUN 17.1 01/31/2016 1002   CREATININE 0.91 07/31/2020 0413   CREATININE 0.88 07/13/2020 0904   CREATININE 0.9 01/31/2016 1002   CALCIUM 8.7 (L) 07/31/2020 0413   CALCIUM 9.5 01/31/2016 1002   PROT 5.8 (L) 07/31/2020 0413   PROT 7.0 01/31/2016  1002   ALBUMIN 2.7 (L) 07/31/2020 0413   ALBUMIN 3.7 01/31/2016 1002   AST 432 (H) 07/31/2020 0413   AST 15 01/31/2016 1002   ALT 749 (H) 07/31/2020 0413   ALT 17 01/31/2016 1002   ALKPHOS 126 07/31/2020 0413   ALKPHOS 45 01/31/2016 1002   BILITOT 3.1 (H) 07/31/2020 0413   BILITOT 0.33 01/31/2016 1002   GFRNONAA >60 07/31/2020 0413   GFRAA >60 04/06/2017 0329   Lipase     Component Value Date/Time   LIPASE 33 07/29/2020 1913       Studies/Results: DG Cholangiogram Operative  Result Date: 07/30/2020 CLINICAL DATA:  Cholelithiasis EXAM: INTRAOPERATIVE CHOLANGIOGRAM TECHNIQUE: Cholangiographic images from the C-arm fluoroscopic device were submitted for interpretation post-operatively. Please see the procedural report for the amount of contrast and the fluoroscopy time utilized. COMPARISON:  07/20/2020 FINDINGS: Intraoperative cholangiogram performed during the laparoscopic procedure. The residual cystic duct, biliary confluence, common hepatic duct, and common bile duct or opacified. Contrast does not pass easily into the duodenum. Cannot exclude distal choledocholithiasis. IMPRESSION: Biliary tree is visualized to the distal CBD without opacification of the duodenum. Difficult to exclude choledocholithiasis. Electronically Signed   By: Jerilynn Mages.  Shick M.D.   On: 07/30/2020 10:43   MR ABDOMEN MRCP W WO  CONTAST  Result Date: 07/31/2020 CLINICAL DATA:  Intraop cholangiogram demonstrated stones versus stricture in the distal common bile duct. History of gallstones and post prandial pain. EXAM: MRI ABDOMEN WITHOUT AND WITH CONTRAST (INCLUDING MRCP) TECHNIQUE: Multiplanar multisequence MR imaging of the abdomen was performed both before and after the administration of intravenous contrast. Heavily T2-weighted images of the biliary and pancreatic ducts were obtained, and three-dimensional MRCP images were rendered by post processing. CONTRAST:  74mL GADAVIST GADOBUTROL 1 MMOL/ML IV SOLN COMPARISON:   Intraoperative cholangiogram 05/30/2020 ultrasound 07/20/2020 FINDINGS: Lower chest:  Small RIGHT effusion. Hepatobiliary: There is fluid in the gallbladder fossa following cholecystectomy. No intrahepatic biliary duct dilatation. The common hepatic duct and common bile duct normal caliber. Common bile duct measuring 4 mm and the common hepatic duct measuring 4 mm. Cystic duct remnant subtly seen. There is small filling defect within distal common bile duct identified on the heavily weighted T2 weighted MRCP sequences on image 36/16. This is approximately 5 mm from the ampulla and measures 3 mm. This small filling defect is also suspected on axial T2 weighted imaging (image 26/5). Filling defect is best seen on the thin coronal MRCP sequence (image 10/series 18). On the series lesion measures 4 mm. Within the subcapsular RIGHT hepatic lobe peripheral enhancing lesion which demonstrates continued enhancement over the course series (image 34/series 29. Lesion has high signal intensity on T2 weighted imaging and most consistent hemangioma. Second smaller hemangioma in the dome liver on image 20/24. Pancreas: Normal pancreatic parenchymal intensity. No ductal dilatation or inflammation. Spleen: Normal spleen. Adrenals/urinary tract: Adrenal glands and kidneys are normal. Stomach/Bowel: Stomach and limited of the small bowel is unremarkable Vascular/Lymphatic: Abdominal aortic normal caliber. No retroperitoneal periportal lymphadenopathy. Musculoskeletal: No aggressive osseous lesion IMPRESSION: 1. Small filling defect (3-4 mm) within the distal common bile duct approximately 5 mm from the ampulla. Filling defect presumably represents a small stone (choledocholithiasis). There is no biliary duct dilatation. 2. Normal pancreas. 3. Postsurgical change consistent recent cholecystectomy. 4. 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 Electronically Signed   By: Suzy Bouchard M.D.   On: 07/31/2020 13:34     Anti-infectives: Anti-infectives (From admission, onward)   Start     Dose/Rate Route Frequency Ordered Stop   07/29/20 2230  cefTRIAXone (ROCEPHIN) 2 g in sodium chloride 0.9 % 100 mL IVPB        2 g 200 mL/hr over 30 Minutes Intravenous Every 24 hours 07/29/20 2225         Assessment/Plan HTN HLD Remote hx of breast cancer Hyperglycemia - some CBG's in the 200's. A1c 6.2 c/w pre-diabetes. SSI for now. F/u PCP  Acute Cholecystitis  - s/p Laparoscopic Cholecystectomy with IOC - Dr. Georgette Dover - 07/30/2020 - POD #2 - IOC positive.MRCP positive. ERCP today. AM labs. - PO pain control - Pulm toilet - OOB/ mobilize - remove Tegaderm/gauze tomorrow, leave steris in place  FEN - NPO for procedure, then ok for HH diet from ccs perspective VTE - SCDs, Lovenox  ID - Rocephin     LOS: 2 days    Jill Alexanders , [facility] 08/01/2020, 10:08 AM Please see Amion for pager number during day hours 7:00am-4:30pm

## 2020-08-01 NOTE — Progress Notes (Signed)
Patient off the unit for ERCP.

## 2020-08-01 NOTE — Anesthesia Preprocedure Evaluation (Addendum)
Anesthesia Evaluation  Patient identified by MRN, date of birth, ID band Patient awake    Reviewed: Allergy & Precautions, NPO status , Patient's Chart, lab work & pertinent test results  History of Anesthesia Complications Negative for: history of anesthetic complications  Airway Mallampati: III  TM Distance: >3 FB Neck ROM: Full    Dental  (+) Dental Advisory Given, Caps   Pulmonary neg pulmonary ROS,    Pulmonary exam normal        Cardiovascular hypertension, + Peripheral Vascular Disease  Normal cardiovascular exam     Neuro/Psych negative neurological ROS  negative psych ROS   GI/Hepatic GERD  Medicated and Controlled,(+) Hepatitis -  Endo/Other   Obesity   Renal/GU negative Renal ROS     Musculoskeletal  (+) Arthritis ,   Abdominal   Peds  Hematology negative hematology ROS (+)   Anesthesia Other Findings Covid test negative   Reproductive/Obstetrics                            Anesthesia Physical Anesthesia Plan  ASA: II  Anesthesia Plan: General   Post-op Pain Management:    Induction: Intravenous  PONV Risk Score and Plan: 3 and Treatment may vary due to age or medical condition, Ondansetron and Dexamethasone  Airway Management Planned: Oral ETT  Additional Equipment: None  Intra-op Plan:   Post-operative Plan: Extubation in OR  Informed Consent: I have reviewed the patients History and Physical, chart, labs and discussed the procedure including the risks, benefits and alternatives for the proposed anesthesia with the patient or authorized representative who has indicated his/her understanding and acceptance.     Dental advisory given  Plan Discussed with: CRNA and Anesthesiologist  Anesthesia Plan Comments:        Anesthesia Quick Evaluation

## 2020-08-01 NOTE — Anesthesia Procedure Notes (Signed)
Procedure Name: Intubation Date/Time: 08/01/2020 12:53 PM Performed by: Myna Bright, CRNA Pre-anesthesia Checklist: Patient identified, Emergency Drugs available, Patient being monitored and Suction available Patient Re-evaluated:Patient Re-evaluated prior to induction Oxygen Delivery Method: Circle system utilized Preoxygenation: Pre-oxygenation with 100% oxygen Induction Type: IV induction Ventilation: Mask ventilation without difficulty Laryngoscope Size: Mac and 3 Grade View: Grade I Tube type: Oral Tube size: 7.0 mm Number of attempts: 1 Airway Equipment and Method: Stylet Placement Confirmation: ETT inserted through vocal cords under direct vision,  positive ETCO2 and breath sounds checked- equal and bilateral Secured at: 21 cm Tube secured with: Tape Dental Injury: Teeth and Oropharynx as per pre-operative assessment

## 2020-08-01 NOTE — Transfer of Care (Signed)
Immediate Anesthesia Transfer of Care Note  Patient: Briana Smith  Procedure(s) Performed: ENDOSCOPIC RETROGRADE CHOLANGIOPANCREATOGRAPHY (ERCP) WITH PROPOFOL (N/A )  Patient Location: Endoscopy Unit  Anesthesia Type:General  Level of Consciousness: sedated and patient cooperative  Airway & Oxygen Therapy: Patient Spontanous Breathing and Patient connected to face mask oxygen  Post-op Assessment: Report given to RN, Post -op Vital signs reviewed and stable and Patient moving all extremities  Post vital signs: Reviewed and stable  Last Vitals:  Vitals Value Taken Time  BP    Temp    Pulse 72 08/01/20 1421  Resp 17 08/01/20 1421  SpO2 99 % 08/01/20 1421  Vitals shown include unvalidated device data.  Last Pain:  Vitals:   08/01/20 1221  TempSrc: Oral  PainSc: 0-No pain      Patients Stated Pain Goal: 2 (14/38/88 7579)  Complications: No complications documented.

## 2020-08-01 NOTE — Progress Notes (Signed)
Patient made NPO at midnight for possible ERCP tomorrow.

## 2020-08-01 NOTE — Progress Notes (Signed)
Patient arrived to the floor. No distress noted. All orders reviewed.

## 2020-08-01 NOTE — Interval H&P Note (Signed)
History and Physical Interval Note:  08/01/2020 12:33 PM  Briana Smith  has presented today for surgery, with the diagnosis of Choledocholithiasis.  07/30/2020 lap chole.  The various methods of treatment have been discussed with the patient and family. After consideration of risks, benefits and other options for treatment, the patient has consented to  Procedure(s): ENDOSCOPIC RETROGRADE CHOLANGIOPANCREATOGRAPHY (ERCP) WITH PROPOFOL (N/A) as a surgical intervention.  The patient's history has been reviewed, patient examined, no change in status, stable for surgery.  I have reviewed the patient's chart and labs.  Questions were answered to the patient's satisfaction.     Milus Banister

## 2020-08-01 NOTE — Op Note (Signed)
[facility] Patient Name: Briana Smith Procedure Date : 08/01/2020 MRN: [id number] Attending MD: Milus Banister , MD Date of Birth: 1941-02-28 CSN: [id number] Age: 79 Admit Type: Inpatient Procedure:                ERCP Indications:              post op lap chole for cholelithiasis,                            cholecystitis, abnormal IOC, elevated liver tests,                            abnormal MRCP Providers:                Milus Banister, MD, Cleda Daub, RN, Benetta Spar, Technician, Laverda Sorenson, Technician,                            Barrett Henle, CRNA Referring MD:              Medicines:                General Anesthesia, Indomethacin 100 mg PR,                            Rocephin 2 g IV in hospital (last night) Complications:            No immediate complications. Estimated blood loss:                            None Estimated Blood Loss:     Estimated blood loss: none. Procedure:                Pre-Anesthesia Assessment:                           - Prior to the procedure, a History and Physical                            was performed, and patient medications and                            allergies were reviewed. The patient's tolerance of                            previous anesthesia was also reviewed. The risks                            and benefits of the procedure and the sedation                            options and risks were discussed with the patient.                            All questions were  answered, and informed consent                            was obtained. Prior Anticoagulants: The patient has                            taken no previous anticoagulant or antiplatelet                            agents. ASA Grade Assessment: II - A patient with                            mild systemic disease. After reviewing the risks                            and benefits, the patient was deemed in                             satisfactory condition to undergo the procedure.                           After obtaining informed consent, the scope was                            passed under direct vision. Throughout the                            procedure, the patient's blood pressure, pulse, and                            oxygen saturations were monitored continuously. The                            TJF-Q180V ([id number] Olympus Duodenoscope was                            introduced through the mouth, and used to inject                            contrast into and used to inject contrast into the                            bile duct. The ERCP was accomplished without                            difficulty. The patient tolerated the procedure                            well. Findings:      Scout film showed surgical clips in the RUQ. The duodenoscope was       advanced to the region of the major papilla without detailed examination       of the UGI tract. A 44 Autotome over a .035 hydrawire was used to  attempt cannulation of the bile duct. The wire was eventually left       within the ventral pancreatic duct to facilitate biliary cannulation       (with a 55 Jagtome over a .025 jagwire). The wire in the PD did not help       and so I then placed a 4Fr 3cm long single pigtail pancreatic duct stent       within the pancreatic duct to facilitate biliary cannulation and also to       decrease risk of acute pancreatitis. After the PD stent was in good       position I was able to use the original 44 Autotome over the .035 wire       to achieve biliary cannulation and injected contrast. Cholangiogram       showed slightly dilated extrahepatic biliary tree (CBD max 74mm) with       distal tortuosity. The CBD contained a single small (3-28mm) mobile       filling defect consistent with the known CBD stone. There was no biliary       leak and no obvious strictures. An adequate biliary sphincterotomy was        performed over the biliary wire and then the bile duct was swept several       times with a 48mm retrieval balloon. A single, small, white, multifaceted       stone was delivered into the duodenum. Completion, occlusion       cholangiogram showed no obvious remaining filling defects. The main       pancreatic duct was never injected with contrast. Impression:               - Choledocholithiasis, treated with biliary                            sphincerotomy, balloon sweeping.                           - A 4Fr 3cm long pancreatic duct stent was placed                            to faciliate biliary cannulation and decrease risk                            of post procedure pancreatitis. Recommendation:           - Return patient to hospital ward for ongoing care.                           - Follow liver tests.                           - I will allow solid food today.                           - She will need KUB in 10 days to see if PD stent                            remains and then EGD/stent removal if it is still  present. Procedure Code(s):        --- Professional ---                           [phone], Endoscopic retrograde                            cholangiopancreatography (ERCP); with placement of                            endoscopic stent into biliary or pancreatic duct,                            including pre- and post-dilation and guide wire                            passage, when performed, including sphincterotomy,                            when performed, each stent                           [id number], Endoscopic retrograde                            cholangiopancreatography (ERCP); with removal of                            calculi/debris from biliary/pancreatic duct(s) Diagnosis Code(s):        --- Professional ---                           K80.50, Calculus of bile duct without cholangitis                            or cholecystitis without  obstruction CPT copyright 2019 [employer]. All rights reserved. The codes documented in this report are preliminary and upon coder review may  be revised to meet current compliance requirements. Milus Banister, MD 08/01/2020 2:26:30 PM This report has been signed electronically. Number of Addenda: 0

## 2020-08-02 LAB — COMPREHENSIVE METABOLIC PANEL
ALT: 439 U/L — ABNORMAL HIGH (ref 0–44)
AST: 191 U/L — ABNORMAL HIGH (ref 15–41)
Albumin: 2.5 g/dL — ABNORMAL LOW (ref 3.5–5.0)
Alkaline Phosphatase: 142 U/L — ABNORMAL HIGH (ref 38–126)
Anion gap: 10 (ref 5–15)
BUN: 13 mg/dL (ref 8–23)
CO2: 23 mmol/L (ref 22–32)
Calcium: 8.1 mg/dL — ABNORMAL LOW (ref 8.9–10.3)
Chloride: 106 mmol/L (ref 98–111)
Creatinine, Ser: 0.78 mg/dL (ref 0.44–1.00)
GFR, Estimated: >60 mL/min (ref >60)
Glucose, Bld: 189 mg/dL — ABNORMAL HIGH (ref 70–99)
Potassium: 3.9 mmol/L (ref 3.5–5.1)
Sodium: 139 mmol/L (ref 135–145)
Total Bilirubin: 2.5 mg/dL — ABNORMAL HIGH (ref 0.3–1.2)
Total Protein: 5.3 g/dL — ABNORMAL LOW (ref 6.5–8.1)

## 2020-08-02 LAB — LIPASE, BLOOD: Lipase: 38 U/L (ref 11–51)

## 2020-08-02 LAB — GLUCOSE, CAPILLARY: Glucose-Capillary: 102 mg/dL — ABNORMAL HIGH (ref 70–99)

## 2020-08-02 MED ORDER — TRAMADOL HCL 50 MG PO TABS: 50.0000 mg | ORAL_TABLET | Freq: Four times a day (QID) | ORAL | Status: DC | PRN

## 2020-08-02 MED ORDER — OXYCODONE HCL 5 MG PO TABS: 5.0000 mg | ORAL_TABLET | Freq: Four times a day (QID) | ORAL | 0 refills | Status: DC | PRN

## 2020-08-02 MED ORDER — OXYCODONE HCL 5 MG PO TABS: 5.0000 mg | ORAL_TABLET | ORAL | Status: DC | PRN

## 2020-08-02 NOTE — Discharge Instructions (Signed)
[facility] LAPAROSCOPIC SURGERY: POST OP INSTRUCTIONS Always review your discharge instruction sheet given to you by the facility where your surgery was performed. IF YOU HAVE DISABILITY OR FAMILY LEAVE FORMS, YOU MUST BRING THEM TO THE OFFICE FOR PROCESSING.   DO NOT GIVE THEM TO YOUR DOCTOR.  PAIN CONTROL  1. First take acetaminophen (Tylenol) AND/or ibuprofen (Advil) to control your pain after surgery.  Follow directions on package.  Taking acetaminophen (Tylenol) and/or ibuprofen (Advil) regularly after surgery will help to control your pain and lower the amount of prescription pain medication you may need.  You should not take more than 3,000 mg (3 grams) of acetaminophen (Tylenol) in 24 hours.  You should not take ibuprofen (Advil), aleve, motrin, naprosyn or other NSAIDS if you have a history of stomach ulcers or chronic kidney disease.  2. A prescription for pain medication may be given to you upon discharge.  Take your pain medication as prescribed, if you still have uncontrolled pain after taking acetaminophen (Tylenol) or ibuprofen (Advil). 3. Use ice packs to help control pain. 4. If you need a refill on your pain medication, please contact your pharmacy.  They will contact our office to request authorization. Prescriptions will not be filled after 5pm or on week-ends.  HOME MEDICATIONS 5. Take your usually prescribed medications unless otherwise directed.  DIET 6. You should follow a light diet the first few days after arrival home.  Be sure to include lots of fluids daily. Avoid fatty, fried foods.   CONSTIPATION 7. It is common to experience some constipation after surgery and if you are taking pain medication.  Increasing fluid intake and taking a stool softener (such as Colace) will usually help or prevent this problem from occurring.  A mild laxative (Milk of Magnesia or Miralax) should be taken according to package instructions if there are no bowel  movements after 48 hours.  WOUND/INCISION CARE 8. Most patients will experience some swelling and bruising in the area of the incisions.  Ice packs will help.  Swelling and bruising can take several days to resolve.  9. Unless discharge instructions indicate otherwise, follow guidelines below  a. STERI-STRIPS - you may remove your outer bandages 48 hours after surgery, and you may shower at that time.  You have steri-strips (small skin tapes) in place directly over the incision.  These strips should be left on the skin for 7-10 days.   b. DERMABOND/SKIN GLUE - you may shower in 24 hours.  The glue will flake off over the next 2-3 weeks. 10. Any sutures or staples will be removed at the office during your follow-up visit.  ACTIVITIES 11. You may resume regular (light) daily activities beginning the next day--such as daily self-care, walking, climbing stairs--gradually increasing activities as tolerated.  You may have sexual intercourse when it is comfortable.  Refrain from any heavy lifting or straining until approved by your doctor. a. You may drive when you are no longer taking prescription pain medication, you can comfortably wear a seatbelt, and you can safely maneuver your car and apply brakes.  FOLLOW-UP 12. You should see your doctor in the office for a follow-up appointment approximately 2-3 weeks after your surgery.  You should have been given your post-op/follow-up appointment when your surgery was scheduled.  If you did not receive a post-op/follow-up appointment, make sure that you call for this appointment within a day or two after you arrive home to insure a convenient appointment time.  WHEN   TO CALL YOUR DOCTOR: 1. Fever over 101.0 2. Inability to urinate 3. Continued bleeding from incision. 4. Increased pain, redness, or drainage from the incision. 5. Increasing abdominal pain  The clinic staff is available to answer your questions during regular business hours.  Please don't  hesitate to call and ask to speak to one of the nurses for clinical concerns.  If you have a medical emergency, go to the nearest emergency room or call 911.  A surgeon from [facility] is always on call at the hospital. [address] [place], [place], [place]  [address] ? [address], [place], [place]   [address] [phone] ? [phone] ? FAX [phone] Web site: [email or web address]   Gallbladder Eating Plan If you have a gallbladder condition, you may have trouble digesting fats. Eating a low-fat diet can help reduce your symptoms, and may be helpful before and after having surgery to remove your gallbladder (cholecystectomy). Your health care provider may recommend that you work with a diet and nutrition specialist (dietitian) to help you reduce the amount of fat in your diet. What are tips for following this plan? General guidelines  Limit your fat intake to less than 30% of your total daily calories. If you eat around 1,800 calories each day, this is less than 60 grams (g) of fat per day.  Fat is an important part of a healthy diet. Eating a low-fat diet can make it hard to maintain a healthy body weight. Ask your dietitian how much fat, calories, and other nutrients you need each day.  Eat small, frequent meals throughout the day instead of three large meals.  Drink at least 8-10 cups of fluid a day. Drink enough fluid to keep your urine clear or pale yellow.  Limit alcohol intake to no more than 1 drink a day for nonpregnant women and 2 drinks a day for men. One drink equals 12 oz of beer, 5 oz of wine, or 1 oz of hard liquor. Reading food labels  Check Nutrition Facts on food labels for the amount of fat per serving. Choose foods with less than 3 grams of fat per serving. Shopping  Choose nonfat and low-fat healthy foods. Look for the words "nonfat," "low fat," or "fat free."  Avoid buying processed or prepackaged foods. Cooking  Cook  using low-fat methods, such as baking, broiling, grilling, or boiling.  Cook with small amounts of healthy fats, such as olive oil, grapeseed oil, canola oil, or sunflower oil. What foods are recommended?   All fresh, frozen, or canned fruits and vegetables.  Whole grains.  Low-fat or non-fat (skim) milk and yogurt.  Lean meat, skinless poultry, fish, eggs, and beans.  Low-fat protein supplement powders or drinks.  Spices and herbs. What foods are not recommended?  High-fat foods. These include baked goods, fast food, fatty cuts of meat, ice cream, french toast, sweet rolls, pizza, cheese bread, foods covered with butter, creamy sauces, or cheese.  Fried foods. These include french fries, tempura, battered fish, breaded chicken, fried breads, and sweets.  Foods with strong odors.  Foods that cause bloating and gas. Summary  A low-fat diet can be helpful if you have a gallbladder condition, or before and after gallbladder surgery.  Limit your fat intake to less than 30% of your total daily calories. This is about 60 g of fat if you eat 1,800 calories each day.  Eat small, frequent meals throughout the day instead of three large meals. This information  is not intended to replace advice given to you by your health care provider. Make sure you discuss any questions you have with your health care provider. Document Revised: 01/13/2019 Document Reviewed: 10/30/2016 Elsevier Patient Education  [place].

## 2020-08-02 NOTE — Progress Notes (Signed)
Pt discharged home with husband. 2 PIV's removed and room belongings packed. Pt educated on discharge information with no questions asked. Pt escorted to daughter's private vehicle in wheelchair.   Justice Rocher, RN

## 2020-08-02 NOTE — Anesthesia Postprocedure Evaluation (Signed)
Anesthesia Post Note  Patient: Briana Smith  Procedure(s) Performed: ENDOSCOPIC RETROGRADE CHOLANGIOPANCREATOGRAPHY (ERCP) WITH PROPOFOL (N/A ) SPHINCTEROTOMY REMOVAL OF STONES PANCREATIC STENT PLACEMENT     Patient location during evaluation: PACU Anesthesia Type: General Level of consciousness: awake and alert Pain management: pain level controlled Vital Signs Assessment: post-procedure vital signs reviewed and stable Respiratory status: spontaneous breathing, nonlabored ventilation and respiratory function stable Cardiovascular status: blood pressure returned to baseline and stable Postop Assessment: no apparent nausea or vomiting Anesthetic complications: no   No complications documented.  Last Vitals:  Vitals:   08/01/20 2348 08/02/20 0359  BP: 132/63 (!) 151/69  Pulse: 65 63  Resp:    Temp: (!) 36.3 C 36.8 C  SpO2: 97% 98%    Last Pain:  Vitals:   08/02/20 0359  TempSrc:   PainSc: 0-No pain                 Audry Pili

## 2020-08-03 ENCOUNTER — Telehealth: Payer: Self-pay

## 2020-08-03 NOTE — Telephone Encounter (Signed)
Follow-up visit would be reasonable-can also try to get her into Saturday clinic

## 2020-08-03 NOTE — Telephone Encounter (Signed)
Can you same day-please note hospital follow-up

## 2020-08-03 NOTE — Telephone Encounter (Signed)
[place] for hospital f/u or OV to f/u on BP?

## 2020-08-03 NOTE — Telephone Encounter (Signed)
Transition Care Management Follow-up Telephone Call  Date of discharge and from where: 08/02/20 [facility]   How have you been since you were released from the hospital? Feeling okay, after taking half  Of lisinopril, BP was down to 154/65,   Any questions or concerns? No  Items Reviewed:  Did the pt receive and understand the discharge instructions provided? Yes   Medications obtained and verified? Yes   Other? No   Any new allergies since your discharge? No   Dietary orders reviewed? Yes  Do you have support at home? Yes   Home Care and Equipment/Supplies: Were home health services ordered? no If so, what is the name of the agency? N/A  Has the agency set up a time to come to the patient's home? not applicable Were any new equipment or medical supplies ordered?  No What is the name of the medical supply agency? N/A Were you able to get the supplies/equipment? not applicable Do you have any questions related to the use of the equipment or supplies? No  Functional Questionnaire: (I = Independent and D = Dependent) ADLs: I   Bathing/Dressing- I  Meal Prep- I  Eating- I  Maintaining continence- I  Transferring/Ambulation- I  Managing Meds- I  Follow up appointments reviewed:   PCP Hospital f/u appt confirmed?No     Specialist Hospital f/u appt confirmed? Yes  Scheduled to see General surgery Follow up on 08/16/20 @ 1:30p.m.  Are transportation arrangements needed? No   If their condition worsens, is the pt aware to call PCP or go to the Emergency Dept.? Yes  Was the patient provided with contact information for the PCP's office or ED? Yes  Was to pt encouraged to call back with questions or concerns? Yes

## 2020-08-03 NOTE — Telephone Encounter (Signed)
Have her track blood pressure over the weekend-I like to see how she does on lisinopril.  If develop symptoms such as chest pain, shortness of breath or any stroke symptoms seek care immediately  This sounds like we need a hospital follow-up

## 2020-08-03 NOTE — Chronic Care Management (AMB) (Signed)
OPEN IN ERROR 

## 2020-08-03 NOTE — Telephone Encounter (Signed)
Patient is calling in saying that she was recently in the hospital, discharged yesterday and noticed that her BP was 188/86 with no symptoms, and then took half of a lisinopril. Please Advise.

## 2020-08-03 NOTE — Telephone Encounter (Signed)
Pt needs a hospital follow up. Was discharged on 10/28. Her BP today is still 177/80. There are no open appointments on your schedule next week. When would you like for [place] to schedule her? Please advise.

## 2020-08-04 ENCOUNTER — Encounter (HOSPITAL_COMMUNITY): Payer: Self-pay | Admitting: Gastroenterology

## 2020-08-06 ENCOUNTER — Telehealth: Payer: Self-pay | Admitting: Gastroenterology

## 2020-08-06 DIAGNOSIS — Q453 Other congenital malformations of pancreas and pancreatic duct: Secondary | ICD-10-CM

## 2020-08-06 NOTE — Telephone Encounter (Signed)
See below

## 2020-08-06 NOTE — Telephone Encounter (Signed)
Called and spoke with pt and she did keep a track of her numbers over the weekend and states they have come down some but she didn't have them with her at the time. She states she will give the office a call to schedule a visit soon.

## 2020-08-06 NOTE — Telephone Encounter (Signed)
Called and spoke with pt, she will call and schedule follow up.

## 2020-08-06 NOTE — Telephone Encounter (Signed)
The pt has been advised to come in on Friday for an xray.  She was told she does not need an appt.  The pt has been advised of the information and verbalized understanding.

## 2020-08-06 NOTE — Telephone Encounter (Signed)
SCHEDULED APPT FOR PT TO SEE DR. [name]

## 2020-08-10 ENCOUNTER — Ambulatory Visit (INDEPENDENT_AMBULATORY_CARE_PROVIDER_SITE_OTHER)
Admission: RE | Admit: 2020-08-10 | Discharge: 2020-08-10 | Disposition: A | Discharge status: Home / Self Care | Payer: Medicare HMO | Source: Ambulatory Visit | Attending: Gastroenterology | Admitting: Gastroenterology

## 2020-08-10 ENCOUNTER — Other Ambulatory Visit: Payer: Self-pay

## 2020-08-10 DIAGNOSIS — I878 Other specified disorders of veins: Secondary | ICD-10-CM | POA: Diagnosis not present

## 2020-08-10 DIAGNOSIS — Q453 Other congenital malformations of pancreas and pancreatic duct: Secondary | ICD-10-CM

## 2020-08-10 DIAGNOSIS — Z9049 Acquired absence of other specified parts of digestive tract: Secondary | ICD-10-CM | POA: Diagnosis not present

## 2020-08-10 DIAGNOSIS — Z9689 Presence of other specified functional implants: Secondary | ICD-10-CM | POA: Diagnosis not present

## 2020-08-13 ENCOUNTER — Telehealth: Payer: Self-pay

## 2020-08-13 NOTE — Telephone Encounter (Signed)
-----   Message from Timothy Lasso, RN sent at 08/02/2020  8:49 AM EDT -----  ----- Message ----- From: Milus Banister, MD Sent: 08/02/2020   8:33 AM EDT To: Timothy Lasso, RN  Hey, She needs KUB in 10 days (check if pancreatic duct stent is still in place).  She is leaving hosp today likely.  Thanks

## 2020-08-13 NOTE — Telephone Encounter (Signed)
The pt has completed the x ray as ordered see 11/5 note

## 2020-08-15 ENCOUNTER — Encounter: Payer: Self-pay | Admitting: Family Medicine

## 2020-08-15 ENCOUNTER — Other Ambulatory Visit: Payer: Self-pay

## 2020-08-15 ENCOUNTER — Ambulatory Visit (INDEPENDENT_AMBULATORY_CARE_PROVIDER_SITE_OTHER)
Admission: RE | Admit: 2020-08-15 | Discharge: 2020-08-15 | Disposition: A | Discharge status: Home / Self Care | Payer: Medicare HMO | Source: Ambulatory Visit | Attending: Family Medicine | Admitting: Family Medicine

## 2020-08-15 ENCOUNTER — Ambulatory Visit (INDEPENDENT_AMBULATORY_CARE_PROVIDER_SITE_OTHER): Payer: Medicare HMO | Admitting: Family Medicine

## 2020-08-15 VITALS — BP 128/82 | HR 66 | Temp 97.9°F | Resp 18 | Ht 61.0 in | Wt 169.4 lb

## 2020-08-15 DIAGNOSIS — M810 Age-related osteoporosis without current pathological fracture: Secondary | ICD-10-CM | POA: Diagnosis not present

## 2020-08-15 DIAGNOSIS — R7989 Other specified abnormal findings of blood chemistry: Secondary | ICD-10-CM | POA: Diagnosis not present

## 2020-08-15 DIAGNOSIS — Z8719 Personal history of other diseases of the digestive system: Secondary | ICD-10-CM | POA: Diagnosis not present

## 2020-08-15 DIAGNOSIS — R739 Hyperglycemia, unspecified: Secondary | ICD-10-CM | POA: Diagnosis not present

## 2020-08-15 DIAGNOSIS — E785 Hyperlipidemia, unspecified: Secondary | ICD-10-CM

## 2020-08-15 DIAGNOSIS — I10 Essential (primary) hypertension: Secondary | ICD-10-CM

## 2020-08-15 NOTE — Patient Instructions (Addendum)
Schedule your bone density test at check out desk. You may also call directly to X-ray at [phone] to schedule an appointment that is convenient for you.  - located [address] across the street from [place] - in the basement - you do need an appointment for the bone density tests.   Please stop by lab before you go If you have mychart- we will send your results within 3 business days of [place] receiving them.  If you do not have mychart- we will call you about results within 5 business days of [place] receiving them.  *please note we are currently using [facility] which has a longer processing time than [place] typically so labs may not come back as quickly as in the past *please also note that you will see labs on mychart as soon as they post. I will later go in and write notes on them- will say "notes from Dr. Yong Channel"  Try pepcid/famotidine over the counter (not as strong as pantoprazole and a different medicine class) and im hoping you will tolerate that better.   Recommended follow up: Return in about 6 months (around 02/12/2021) for physical or sooner if needed.

## 2020-08-15 NOTE — Progress Notes (Signed)
Phone [phone]   Subjective:  Briana Smith is a 79 y.o. year old very pleasant female patient who presents for transitional care management and hospital follow up for cholecytitis. Patient was hospitalized from July 29, 2020 to October 20 820 x 1. A TCM phone call was completed on 08/03/2020. Medical complexity moderate  Patient presented to the emergency room with right upper quadrant pain after eating.  Work-up showed acute cholecystitis. Patient with laparoscopic cholecystectomy with IOC on 07/30/20 with Dr. Georgette Dover. Patient ultimately required an MRCP and an ERCP on October 27 with placement of plastic pancreatic duct cyst as well as sphincterotomy and balloon sweeping of the common bile duct (had a lodged stone).  Patient had significant LFT elevations with AST up to 1315 trending down to 191 after stent placement.  ALT trending down from 9 44-4 39.  Alkaline phosphatase actually trended up slightly to 142 before discharge.  Bilirubin was largely stable around 3.  Patient also has plans for repeat KUB to evaluate stent placement with GI-the stent has migrated and likely passed from her system per Dr. Ardis Hughs notes  She had previously been seen by Dr. Jerline Pain July 13, 2020 and referral to general surgery was placed at that time.  She was also placed on Protonix 40 mg for possible peptic ulcer disease Chronic medical issues discussed in problem-oriented charting below   See problem oriented charting as well  Past Medical History-  Patient Active Problem List   Diagnosis Date Noted  . History of septic arthritis 07/26/2018    Priority: High  . Osteoporosis 01/20/2014    Priority: High  . History of breast cancer 06/30/2013    Priority: High  . Hyperlipidemia 02/02/2017    Priority: Medium  . Hyperglycemia 03/19/2015    Priority: Medium  . Essential hypertension 08/03/2008    Priority: Medium  . Cellulitis of left breast 03/28/2017    Priority: Low  . SKIN CANCER, HX OF  08/03/2008    Priority: Low  . Abnormal cholangiogram   . Acute cholecystitis 07/29/2020  . Senile purpura ([place]) 01/25/2020    Medications- reviewed and updated  A medical reconciliation was performed comparing current medicines to hospital discharge medications. Current Outpatient Medications  Medication Sig Dispense Refill  . cholecalciferol (VITAMIN D) 1000 units tablet Take 2,000 Units by mouth daily.    . CYANOCOBALAMIN PO Take 1 tablet by mouth daily.     . diclofenac sodium (VOLTAREN) 1 % GEL Apply 4 g topically 4 (four) times daily. To affected joint. (Patient taking differently: Apply 4 g topically as needed. To affected joint.) 100 g 11  . Multiple Vitamin (MULTIVITAMIN) tablet Take 1 tablet by mouth daily.      . polyethylene glycol (MIRALAX / GLYCOLAX) 17 g packet Take 17 g by mouth daily.    Marland Kitchen senna-docusate (SENOKOT-S) 8.6-50 MG tablet Take 1 tablet by mouth at bedtime as needed for mild constipation. 30 tablet 0  . oxyCODONE (OXY IR/ROXICODONE) 5 MG immediate release tablet Take 1 tablet (5 mg total) by mouth every 6 (six) hours as needed for breakthrough pain. (Patient not taking: Reported on 08/03/2020) 15 tablet 0  . pantoprazole (PROTONIX) 40 MG tablet Take 1 tablet (40 mg total) by mouth daily. (Patient not taking: Reported on 08/03/2020) 30 tablet 0  . pyridoxine (B-6) 100 MG tablet Take 100 mg by mouth daily.  (Patient not taking: Reported on 08/03/2020)     No current facility-administered medications for this visit.   Objective  Objective:  BP 128/82   Pulse 66   Temp 97.9 F (36.6 C) (Temporal)   Resp 18   Ht 5\' 1"  (1.549 m)   Wt 169 lb 6.4 oz (76.8 kg)   SpO2 95%   BMI 32.01 kg/m  Gen: NAD, resting comfortably CV: RRR no murmurs rubs or gallops Lungs: CTAB no crackles, wheeze, rhonchi Abdomen: soft/surprisingly nonntender/nondistended/normal bowel sounds. No rebound or guarding. Port sites healing well.  Ext: no edema Skin: warm, dry   Assessment  and Plan:   # Hospital follow up for cholecystitis patient s/p laparoscopic cholecystectomy  and then ERCP for retained stone. LFTs trending down last check- update with labs today. Sees general surgery tomorrow. Sees Dr. Ardis Hughs on the 29th. Off all pain medicines.   #hypertension S: in 2020 and 2021 BP trending down- has been able to come off BP medications. Most recently lisinopril stopped before 01/25/20 visit unfortunately had noted BP increasing at home up over 170- she has taken half of lisinopril 10mg  tablet twice then she ran out.   Please note coordination of care through phone calls about blood pressure over recent weeks  Home #s: trending back down over last week- last night 125/75 without meds A/P: much improved without meds. Continue without lisinopril   #hyperlipidemia S: prior on  rosuvastatin 20 mg twice a week.  Cautious about increasing due to prediabetes. wants to trial off 01/25/20 due to arthralgias  Lab Results  Component Value Date   CHOL 164 07/28/2019   HDL 62.40 07/28/2019   LDLCALC 87 07/28/2019   LDLDIRECT 141.7 11/08/2013   TRIG 71.0 07/28/2019   CHOLHDL 3 07/28/2019  A/P: cholesterol likely poorly controlled but had arthralgias off medicine. Recheck today- may consider lower dose once a week such as rosuvastatin 10 mg once a week.   # Hyperglycemia/insulin resistance/prediabetes-peak A1c 6.2 S: Medication:  none  Exercise and diet-  Down 7 lbs from last visit. Has not been exercising due to recent surgery. Before surgery was doing treadmill and some foot pedaling. She has been intentional about eating better.   cbg- 109 this AM Lab Results  Component Value Date   HGBA1C 6.2 (H) 07/30/2020   HGBA1C 6.0 01/25/2020   HGBA1C 6.2 07/28/2019   A/P:  Stable within 6-6.2 range over last year- focus on healthy eating/regular exercise and recheck at physical   #Osteoporosis S: Compliant with Fosamax weekly in past (feels much better off this- stopped 2 weeks ago-  states aches in legs have improved).  She had been on Reclast in the past when on anastrozole.  Last bone density with worst T score -2.7 in October 2019.  Patient is compliant with calcium and vitamin D   A/P: not tolerating fosamax. Will update bone density- she would like to stay off and repeat in 2 years most likely and if worsening reconsider   #GERD- mild indigestion since being home from the hospital. Pantoprazole 40mg  but causes left joint pain. Try pepcid over the counter (not as strong as pantoprazole and a different medicine class) and im hoping you will tolerate that better.   Recommended follow up: Return in about 6 months (around 02/12/2021) for physical or sooner if needed. Future Appointments  Date Time Provider [place]  09/03/2020  9:30 AM Alfredia Ferguson, PA-C [facility]  11/26/2020  1:00 PM [facility] CCM PHARMACIST [facility]   Lab/Order associations:   ICD-10-CM   1. History of cholecystitis  Z87.19   2. Elevated LFTs  R79.89   3. Age-related osteoporosis without current pathological fracture  M81.0 DG Bone Density  4. Essential hypertension  I10 CBC With Differential/Platelet    COMPLETE METABOLIC PANEL WITH GFR  5. Hyperlipidemia, unspecified hyperlipidemia type  E78.5 Lipid Panel w/reflex Direct LDL  6. Hyperglycemia  R73.9 CANCELED: Hemoglobin A1c   Return precautions advised.  Garret Reddish, MD

## 2020-08-16 ENCOUNTER — Other Ambulatory Visit: Payer: Self-pay

## 2020-08-16 LAB — COMPLETE METABOLIC PANEL WITH GFR
AG Ratio: 1.4 (calc) (ref 1.0–2.5)
ALT: 49 U/L — ABNORMAL HIGH (ref 6–29)
AST: 25 U/L (ref 10–35)
Albumin: 4.1 g/dL (ref 3.6–5.1)
Alkaline phosphatase (APISO): 137 U/L (ref 37–153)
BUN: 21 mg/dL (ref 7–25)
CO2: 30 mmol/L (ref 20–32)
Calcium: 9.7 mg/dL (ref 8.6–10.4)
Chloride: 104 mmol/L (ref 98–110)
Creat: 0.86 mg/dL (ref 0.60–0.88)
GFR, Est African American: 73 mL/min/{1.73_m2} (ref >=60)
GFR, Est Non African American: 63 mL/min/{1.73_m2} (ref >=60)
Globulin: 2.9 g/dL (calc) (ref 1.9–3.7)
Glucose, Bld: 110 mg/dL — ABNORMAL HIGH (ref 65–99)
Potassium: 5.2 mmol/L (ref 3.5–5.3)
Sodium: 142 mmol/L (ref 135–146)
Total Bilirubin: 0.8 mg/dL (ref 0.2–1.2)
Total Protein: 7 g/dL (ref 6.1–8.1)

## 2020-08-16 LAB — CBC WITH DIFFERENTIAL/PLATELET
Absolute Monocytes: 403 cells/uL (ref 200–950)
Basophils Absolute: 101 cells/uL (ref 0–200)
Basophils Relative: 1.9 %
Eosinophils Absolute: 345 cells/uL (ref 15–500)
Eosinophils Relative: 6.5 %
HCT: 40.7 % (ref 35.0–45.0)
Hemoglobin: 13.1 g/dL (ref 11.7–15.5)
Lymphs Abs: 827 cells/uL — ABNORMAL LOW (ref 850–3900)
MCH: 29.8 pg (ref 27.0–33.0)
MCHC: 32.2 g/dL (ref 32.0–36.0)
MCV: 92.7 fL (ref 80.0–100.0)
MPV: 11.2 fL (ref 7.5–12.5)
Monocytes Relative: 7.6 %
Neutro Abs: 3625 cells/uL (ref 1500–7800)
Neutrophils Relative %: 68.4 %
Platelets: 326 10*3/uL (ref 140–400)
RBC: 4.39 10*6/uL (ref 3.80–5.10)
RDW: 13.7 % (ref 11.0–15.0)
Total Lymphocyte: 15.6 %
WBC: 5.3 10*3/uL (ref 3.8–10.8)

## 2020-08-16 LAB — LIPID PANEL W/REFLEX DIRECT LDL
Cholesterol: 251 mg/dL — ABNORMAL HIGH (ref <200)
HDL: 58 mg/dL (ref >=50)
LDL Cholesterol (Calc): 165 mg/dL (calc) — ABNORMAL HIGH
Non-HDL Cholesterol (Calc): 193 mg/dL (calc) — ABNORMAL HIGH (ref <130)
Total CHOL/HDL Ratio: 4.3 (calc) (ref <5.0)
Triglycerides: 139 mg/dL (ref <150)

## 2020-08-16 MED ORDER — ROSUVASTATIN CALCIUM 10 MG PO TABS: 10.0000 mg | ORAL_TABLET | ORAL | 3 refills | Status: DC

## 2020-09-03 ENCOUNTER — Ambulatory Visit: Payer: Medicare HMO | Admitting: Physician Assistant

## 2020-09-03 ENCOUNTER — Encounter: Payer: Self-pay | Admitting: Physician Assistant

## 2020-09-03 ENCOUNTER — Other Ambulatory Visit (INDEPENDENT_AMBULATORY_CARE_PROVIDER_SITE_OTHER): Payer: Medicare HMO

## 2020-09-03 VITALS — BP 140/84 | HR 65 | Ht 60.0 in | Wt 176.0 lb

## 2020-09-03 DIAGNOSIS — K8050 Calculus of bile duct without cholangitis or cholecystitis without obstruction: Secondary | ICD-10-CM

## 2020-09-03 DIAGNOSIS — K8043 Calculus of bile duct with acute cholecystitis with obstruction: Secondary | ICD-10-CM | POA: Diagnosis not present

## 2020-09-03 DIAGNOSIS — K8051 Calculus of bile duct without cholangitis or cholecystitis with obstruction: Secondary | ICD-10-CM | POA: Insufficient documentation

## 2020-09-03 DIAGNOSIS — R7989 Other specified abnormal findings of blood chemistry: Secondary | ICD-10-CM

## 2020-09-03 DIAGNOSIS — Z9049 Acquired absence of other specified parts of digestive tract: Secondary | ICD-10-CM

## 2020-09-03 LAB — HEPATIC FUNCTION PANEL
ALT: 23 U/L (ref 0–35)
AST: 19 U/L (ref 0–37)
Albumin: 4.2 g/dL (ref 3.5–5.2)
Alkaline Phosphatase: 62 U/L (ref 39–117)
Bilirubin, Direct: 0.1 mg/dL (ref 0.0–0.3)
Total Bilirubin: 0.5 mg/dL (ref 0.2–1.2)
Total Protein: 7.4 g/dL (ref 6.0–8.3)

## 2020-09-03 NOTE — Patient Instructions (Signed)
If you are age 81 or older, your body mass index should be between 23-30. Your Body mass index is 34.37 kg/m. If this is out of the aforementioned range listed, please consider follow up with your Primary Care Provider.  If you are age 40 or younger, your body mass index should be between 19-25. Your Body mass index is 34.37 kg/m. If this is out of the aformentioned range listed, please consider follow up with your Primary Care Provider.   Your provider has requested that you go to the basement level for lab work before leaving today. Press "B" on the elevator. The lab is located at the first door on the left as you exit the elevator.  Follow up with Dr. Carlean Purl or Nicoletta Ba, PA-C as needed.

## 2020-09-03 NOTE — Progress Notes (Signed)
Subjective:    Patient ID: Briana Smith, female    DOB: 08/10/1941, 79 y.o.   MRN: [id number]  HPI Briana Smith is a pleasant 79 year old white female, known remotely to Dr. Deatra Ina and seen most recently per Dr. Carlean Purl during recent hospitalization 10/24 through 08/02/2020 at which time she was admitted with acute cholecystitis.  She comes in today for follow-up post hospital.  She underwent laparoscopic cholecystectomy, and had IOC could not exclude common bile duct stone.  Subsequent MRCP was done on 07/31/2020 with finding of a small filling defect measuring 3 to 4 mm in the distal CBD.  She did have normal LFTs on admission, then acute bump with T bili 2.9/alk phos 107/AST 1315/ALT 944 on 07/30/2020. She underwent ERCP per Dr. Ardis Hughs on 08/01/2020 with sphincterotomy, placement of pancreatic duct stent and removal of a single small CBD stone. Follow-up KUB on 08/10/2020 pancreatic duct stent not visible, presumed passed  LFTs on 08/03/2019 1T bili 2.5/alk phos 142/AST 191/ALT 439. Follow-up as outpatient on 08/15/2020 LFTs normalized with exception of ALT at 49. Patient says she has been feeling well since discharge from the hospital, her only complaint is some soreness in the right upper quadrant laterally which she has noticed only with sneezing.  No abdominal pain.  No fever or chills, no nausea or vomiting.  Appetite has been good.   Review of Systems Pertinent positive and negative review of systems were noted in the above HPI section.  All other review of systems was otherwise negative.  Outpatient Encounter Medications as of 09/03/2020  Medication Sig  . Calcium Carbonate-Vit D-Min (CALCIUM 1200 PO) Take by mouth.  . cholecalciferol (VITAMIN D) 1000 units tablet Take 2,000 Units by mouth daily.  . CYANOCOBALAMIN PO Take 1 tablet by mouth daily.   . diclofenac sodium (VOLTAREN) 1 % GEL Apply 4 g topically 4 (four) times daily. To affected joint. (Patient taking differently: Apply 4 g  topically as needed. To affected joint.)  . Multiple Vitamin (MULTIVITAMIN) tablet Take 1 tablet by mouth daily.    . polyethylene glycol (MIRALAX / GLYCOLAX) 17 g packet Take 17 g by mouth daily.  Marland Kitchen pyridoxine (B-6) 100 MG tablet Take 100 mg by mouth daily.   . rosuvastatin (CRESTOR) 10 MG tablet Take 1 tablet (10 mg total) by mouth once a week.  . [DISCONTINUED] senna-docusate (SENOKOT-S) 8.6-50 MG tablet Take 1 tablet by mouth at bedtime as needed for mild constipation.   No facility-administered encounter medications on file as of 09/03/2020.   No Known Allergies Patient Active Problem List   Diagnosis Date Noted  . Choledocholithiasis with obstruction 09/03/2020  . Abnormal cholangiogram   . Acute cholecystitis 07/29/2020  . Senile purpura ([place]) 01/25/2020  . History of septic arthritis 07/26/2018  . Cellulitis of left breast 03/28/2017  . Hyperlipidemia 02/02/2017  . Hyperglycemia 03/19/2015  . Osteoporosis 01/20/2014  . History of breast cancer 06/30/2013  . Essential hypertension 08/03/2008  . SKIN CANCER, HX OF 08/03/2008   Social History   Socioeconomic History  . Marital status: Married    Spouse name: [place]  . Number of children: 3  . Years of education: Not on file  . Highest education level: Not on file  Occupational History  . Not on file  Tobacco Use  . Smoking status: Never Smoker  . Smokeless tobacco: Never Used  Vaping Use  . Vaping Use: Never used  Substance and Sexual Activity  . Alcohol use: No  . Drug use:  No  . Sexual activity: Not on file  Other Topics Concern  . Not on file  Social History Narrative   Married. Lives with husband. 3 children. 3 grandkids (22, 22, 18 in 2018).       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       Hobbies: working puzzles, reading, shoping, beach   Social Determinants of Health   Financial Resource Strain:   . Difficulty of Paying Living Expenses: Not on file  Food Insecurity:   . Worried About  Charity fundraiser in the Last Year: Not on file  . Ran Out of Food in the Last Year: Not on file  Transportation Needs:   . Lack of Transportation (Medical): Not on file  . Lack of Transportation (Non-Medical): Not on file  Physical Activity: Insufficiently Active  . Days of Exercise per Week: 3 days  . Minutes of Exercise per Session: 30 min  Stress:   . Feeling of Stress : Not on file  Social Connections:   . Frequency of Communication with Friends and Family: Not on file  . Frequency of Social Gatherings with Friends and Family: Not on file  . Attends Religious Services: Not on file  . Active Member of Clubs or Organizations: Not on file  . Attends Archivist Meetings: Not on file  . Marital Status: Not on file  Intimate Partner Violence:   . Fear of Current or Ex-Partner: Not on file  . Emotionally Abused: Not on file  . Physically Abused: Not on file  . Sexually Abused: Not on file    Briana Smith's family history includes Breast cancer in her paternal aunt; COPD in her father; Cancer in her brother; Dementia in her mother; Diabetes in her mother; Hypertension in her brother; Kidney disease in her mother.      Objective:    Vitals:   09/03/20 0928  BP: 140/84  Pulse: 65    Physical Exam Well-developed well-nourished eld WF  in no acute distress.  Height, LZBCAF,683  BMI34  HEENT; nontraumatic normocephalic, EOMI, PE RR LA, sclera anicteric. Oropharynx;not done  Neck; supple, no JVD Cardiovascular; regular rate and rhythm with S1-S2, no murmur rub or gallop Pulmonary; Clear bilaterally Abdomen; soft, nontender, nondistended, no palpable mass or hepatosplenomegaly, bowel sounds are active, healing incisional ports  Rectal;not done Skin; benign exam, no jaundice rash or appreciable lesions Extremities; no clubbing cyanosis or edema skin warm and dry Neuro/Psych; alert and oriented x4, grossly nonfocal mood and affect appropriate       Assessment &  Plan:   #16 79 year old white female seen in follow-up today after recent hospitalization with acute cholecystitis and choledocholithiasis. She is status post laparoscopic cholecystectomy, and ERCP, sphincterotomy and removal of a common bile duct stone 08/01/2020. Significantly elevated LFTs almost completely normalized by 08/15/2020. Patient has been doing well since discharge from the hospital with no GI complaints  #2 hypertension 3.  History of breast cancer  Plan; repeat hepatic panel today Assuming LFTs normalized, patient will follow up with Dr. Carlean Purl or myself on an as-needed basis.  Izan Miron Genia Harold PA-C 09/03/2020   Cc: [name], MD

## 2020-10-02 ENCOUNTER — Other Ambulatory Visit: Payer: Self-pay | Admitting: Family Medicine

## 2020-10-02 DIAGNOSIS — Z1231 Encounter for screening mammogram for malignant neoplasm of breast: Secondary | ICD-10-CM

## 2020-10-23 IMAGING — US US ABDOMEN LIMITED
1 series · 13 of 25 positions shown · non-contrast
Comparison: MRI the abdomen, 01/18/2014.

CLINICAL DATA: Epigastric abdominal pain.

EXAM:
ULTRASOUND ABDOMEN LIMITED RIGHT UPPER QUADRANT

[Series 1: us abdomen limited · 0.23mm/px · 13 of 70 slices shown]
[im 1/70]
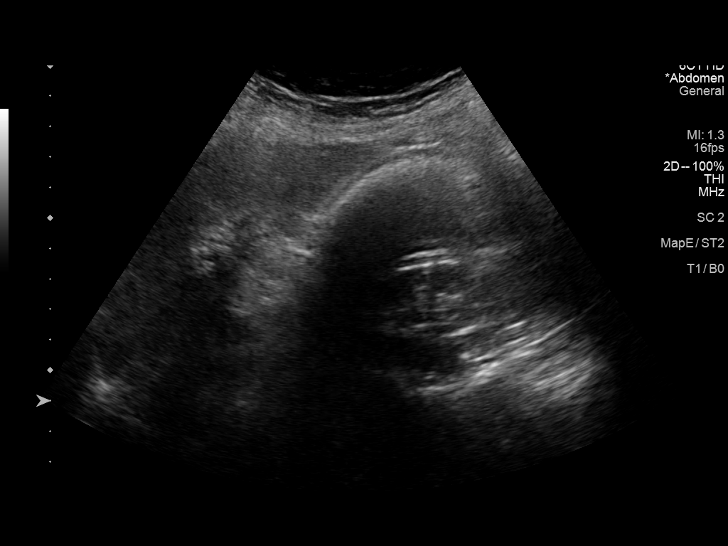
[im 6/70]
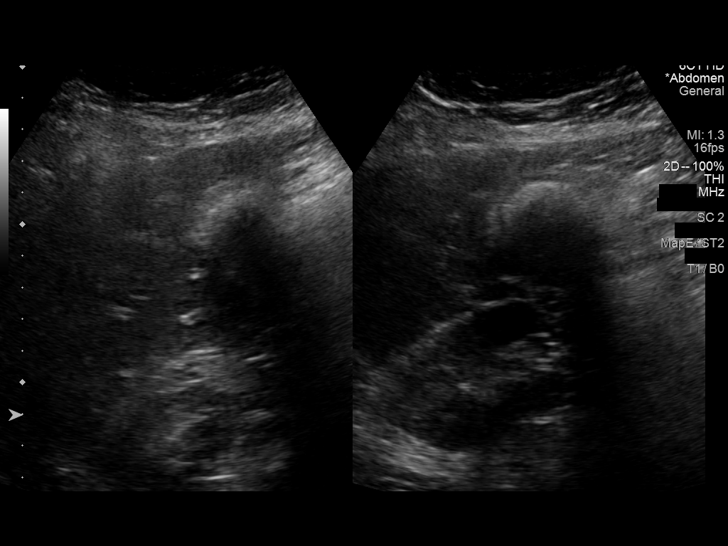
[im 12/70]
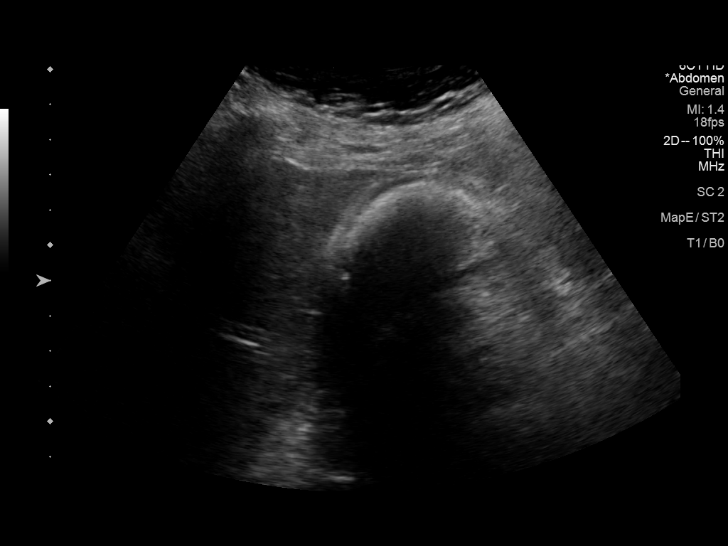
[im 18/70]
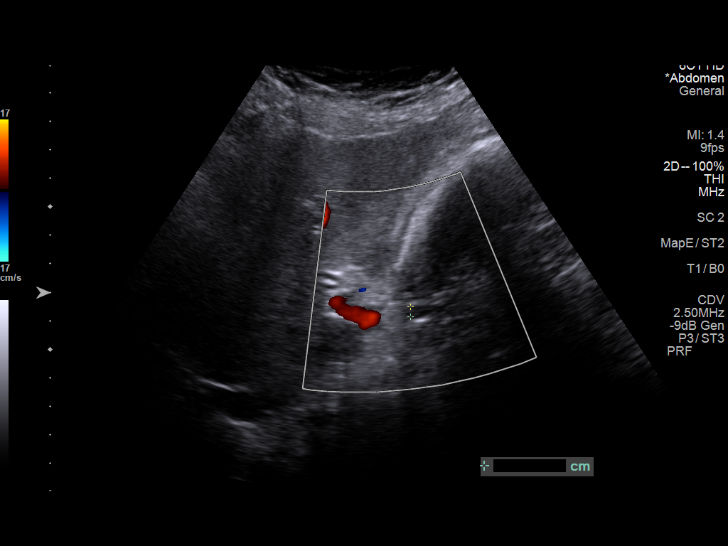
[im 24/70]
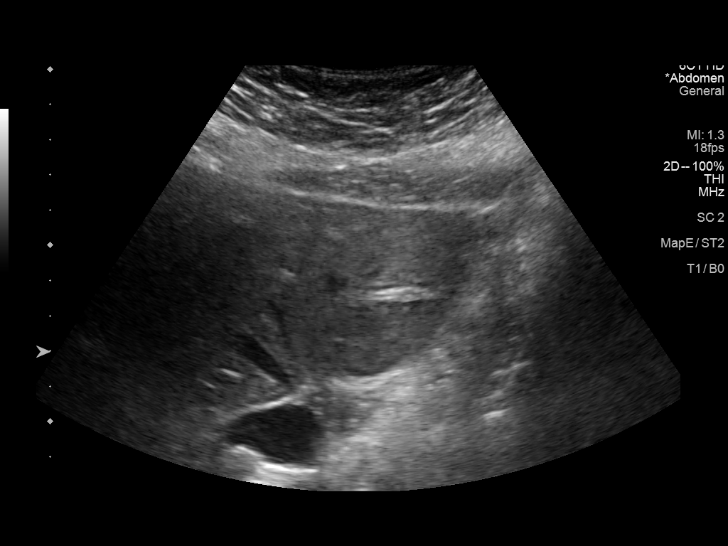
[im 29/70]
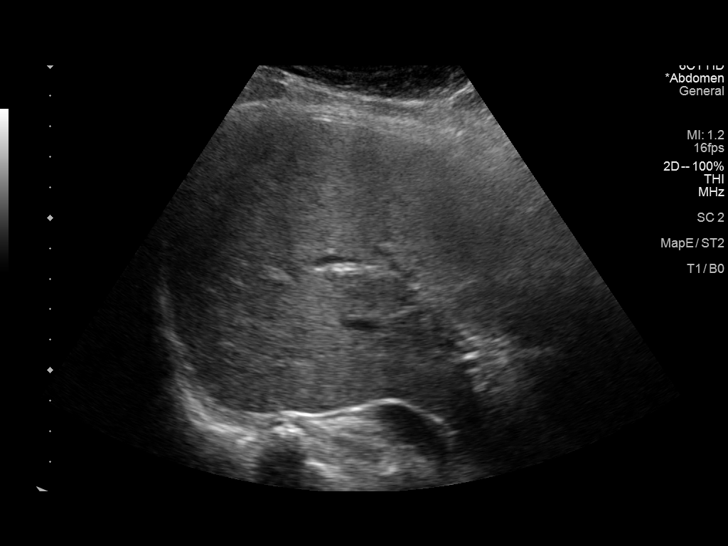
[im 35/70]
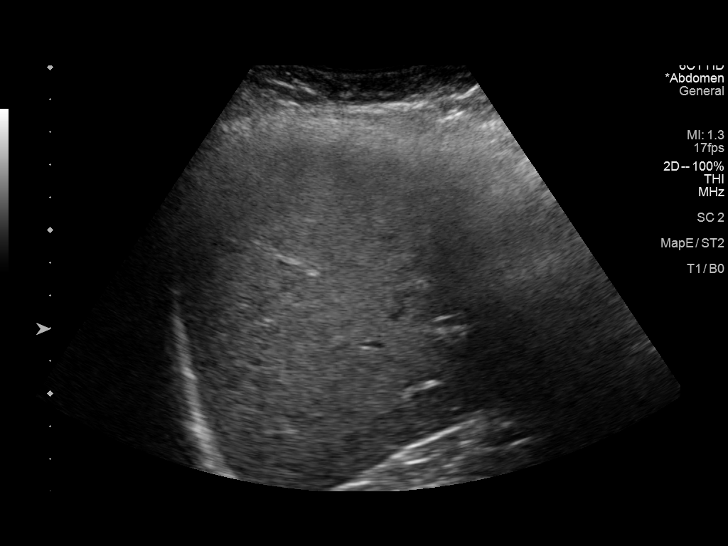
[im 41/70]
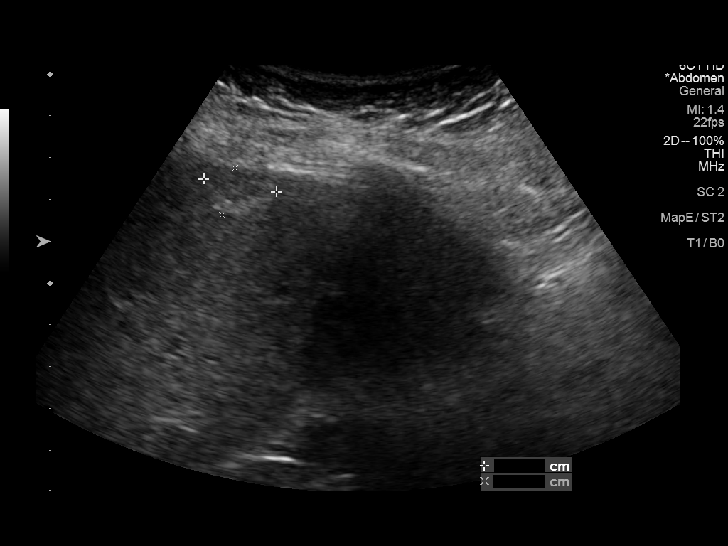
[im 47/70]
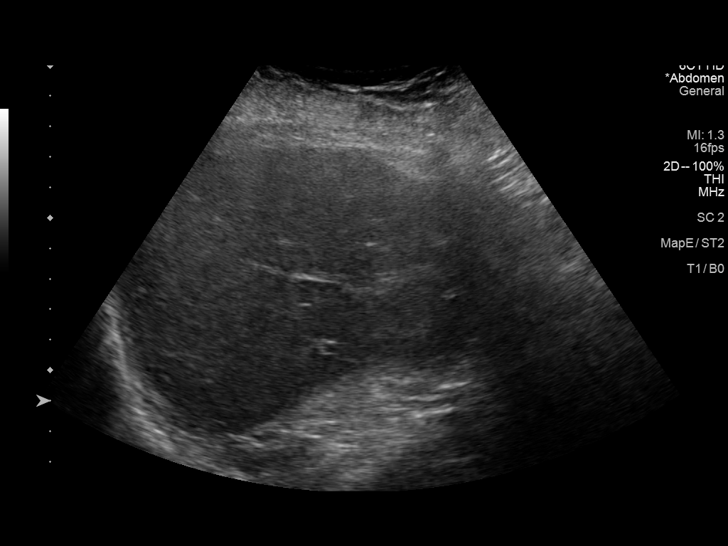
[im 52/70]
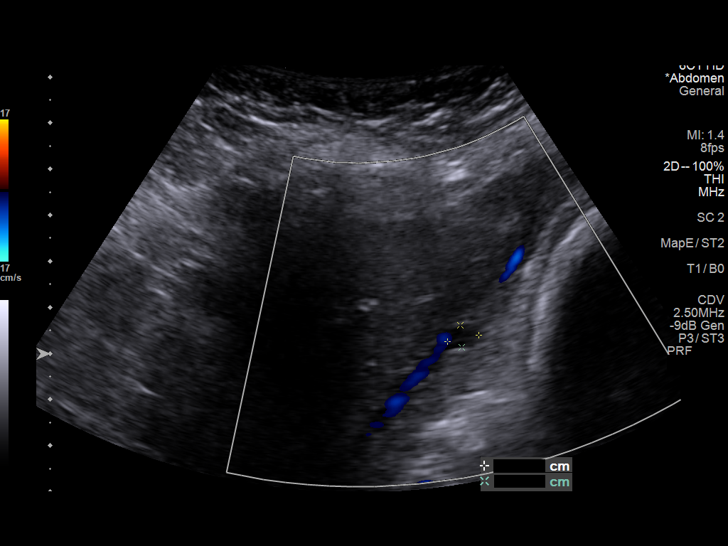
[im 58/70]
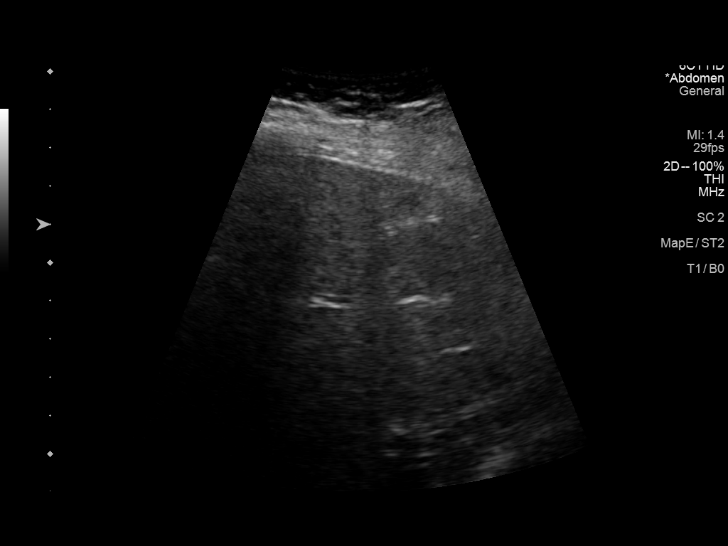
[im 64/70]
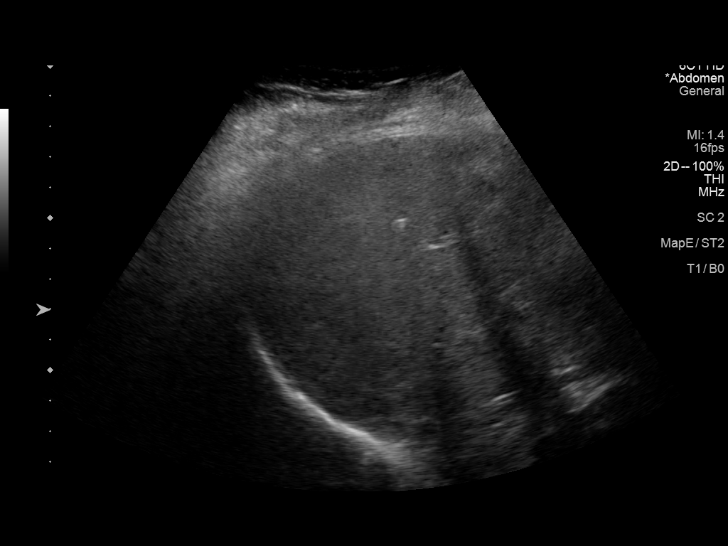
[im 70/70]
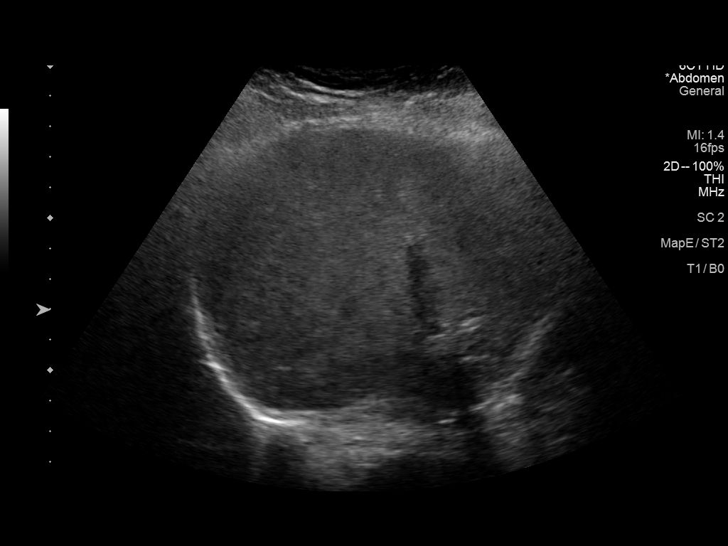

[13 of 25 positions shown; findings below may reference images not displayed]

FINDINGS: Gallbladder:

Diffuse acoustic shadowing just inside the gallbladder wall
consistent with multiple stones. Wall may be thickened, measurements
suggested at 6 mm. No sonographic Murphy's sign or pericholecystic
fluid.

Common bile duct:

Diameter: 4 mm

Liver:

Heterogeneous echotexture. Echogenic lesion noted in the right lobe
measuring 1.8 x 1.2 x 1.8 cm. This corresponds to the presumed
hemangioma noted on the prior liver MRI. Small cyst inferiorly near
the porta hepatis measuring 7 mm. Several small calcifications noted
consistent with healed granuloma. No other defined liver masses or
lesions. Portal vein is patent on color Doppler imaging with normal
direction of blood flow towards the liver.

Other: None.
IMPRESSION: 1. Gallbladder suboptimally assessed due to extensive shadowing just
inside the gallbladder wall consistent with numerous stones. The
wall appears thickened, but is difficult to accurately assess. There
is no sonographic Murphy's sign. Acute cholecystitis should be
considered in the proper clinical setting and may warrant a
follow-up HIDA scan.
2. Heterogeneous appearance of the liver, nonspecific. 1.8 cm may
hemangioma in the right lobe and small cyst near the gallbladder
fossa.

## 2020-11-09 ENCOUNTER — Other Ambulatory Visit: Payer: Self-pay

## 2020-11-09 ENCOUNTER — Ambulatory Visit
Admission: RE | Admit: 2020-11-09 | Discharge: 2020-11-09 | Disposition: A | Discharge status: Home / Self Care | Payer: Medicare HMO | Source: Ambulatory Visit | Attending: Family Medicine | Admitting: Family Medicine

## 2020-11-09 DIAGNOSIS — Z1231 Encounter for screening mammogram for malignant neoplasm of breast: Secondary | ICD-10-CM

## 2020-11-23 NOTE — Progress Notes (Signed)
Chronic Care Management Pharmacy Note  11/26/2020 Name:  Briana Smith MRN:  [id number] DOB:  05-14-1941  Subjective: Briana Smith is an 80 y.o. year old female who is a primary patient of [name], [name], MD.  The CCM team was consulted for assistance with disease management and care coordination needs.   Engaged with patient by telephone for follow up visit in response to provider referral for pharmacy case management and/or care coordination services.   Consent to Services:  The patient was given information about Chronic Care Management services, agreed to services, and gave verbal consent prior to initiation of services.  Please see initial visit note for detailed documentation.  Patient Care Team: [name], MD as PCP - General (Family Medicine) [name], MD as Consulting Physician (Dermatology) [name], [facility] as Pharmacist (Pharmacist)  Recent office visits: 08/15/2020 (PCP): DEXA- osteopenia, major hip >3%. Decided on repeat scan in two years then consider fosamax. Lipids elevated, consider low dose once weekly rosuvastatin 10 mg 07/13/2020 (Dr. Jerline Pain): epigastric abdominal pian  Objective: Lab Results  Component Value Date   CREATININE 0.86 08/15/2020   BUN 21 08/15/2020   GFR 63.30 01/25/2020   GFRNONAA 63 08/15/2020   GFRAA 73 08/15/2020   NA 142 08/15/2020   K 5.2 08/15/2020   CALCIUM 9.7 08/15/2020   CO2 30 08/15/2020   Lab Results  Component Value Date/Time   HGBA1C 6.2 (H) 07/30/2020 06:54 AM   HGBA1C 6.0 01/25/2020 08:59 AM   GFR 63.30 01/25/2020 08:59 AM   GFR 55.82 (L) 07/28/2019 09:27 AM    Last diabetic Eye exam: No results found for: HMDIABEYEEXA  Last diabetic Foot exam: No results found for: HMDIABFOOTEX  Lab Results  Component Value Date   CHOL 251 (H) 08/15/2020   HDL 58 08/15/2020   LDLCALC 165 (H) 08/15/2020   LDLDIRECT 141.7 11/08/2013   TRIG 139 08/15/2020   CHOLHDL 4.3 08/15/2020   Hepatic Function Latest Ref  Rng & Units 09/03/2020 08/15/2020 08/02/2020  Total Protein 6.0 - 8.3 g/dL 7.4 7.0 5.3(L)  Albumin 3.5 - 5.2 g/dL 4.2 - 2.5(L)  AST 0 - 37 U/L 19 25 191(H)  ALT 0 - 35 U/L 23 49(H) 439(H)  Alk Phosphatase 39 - 117 U/L 62 - 142(H)  Total Bilirubin 0.2 - 1.2 mg/dL 0.5 0.8 2.5(H)  Bilirubin, Direct 0.0 - 0.3 mg/dL 0.1 - -   Lab Results  Component Value Date/Time   TSH 3.60 07/28/2019 09:27 AM   TSH 1.104 03/28/2017 12:14 PM   TSH 2.99 01/21/2016 08:45 AM   FREET4 0.8 01/19/2009 03:05 PM   CBC Latest Ref Rng & Units 08/15/2020 07/30/2020 07/29/2020  WBC 3.8 - 10.8 Thousand/uL 5.3 10.4 13.4(H)  Hemoglobin 11.7 - 15.5 g/dL 13.1 13.0 13.6  Hematocrit 35.0 - 45.0 % 40.7 40.3 42.8  Platelets 140 - 400 Thousand/uL 326 161 180   No results found for: VD25OH  08/15/2020 (PCP): DEXA- osteopenia, major hip >3%. Decided on repeat scan in two years then consider fosamax  Clinical ASCVD: No  The ASCVD Risk score Mikey Bussing DC Jr., et al., 2013) failed to calculate for the following reasons:   The 2013 ASCVD risk score is only valid for ages 2 to 8    Depression screen PHQ 2/9 01/25/2020 07/28/2019 07/26/2018  Decreased Interest 0 0 0  Down, Depressed, Hopeless 0 0 0  PHQ - 2 Score 0 0 0    Social History   Tobacco Use  Smoking  Status Never Smoker  Smokeless Tobacco Never Used   BP Readings from Last 3 Encounters:  09/03/20 140/84  08/15/20 128/82  08/02/20 (!) 174/70   Pulse Readings from Last 3 Encounters:  09/03/20 65  08/15/20 66  08/02/20 (!) 59   Wt Readings from Last 3 Encounters:  09/03/20 176 lb (79.8 kg)  08/15/20 169 lb 6.4 oz (76.8 kg)  08/01/20 175 lb 4.3 oz (79.5 kg)   Assessment/Interventions: Review of patient past medical history, allergies, medications, health status, including review of consultants reports, laboratory and other test data, was performed as part of comprehensive evaluation and provision of chronic care management services.   SDOH:  (Social  Determinants of Health) assessments and interventions performed: Yes  CCM Care Plan No Known Allergies Medications Reviewed Today    Reviewed by Leotis Pain (Physician Assistant Certified) on 16/10/96 at [place] List Status: <None>  Medication Order Taking? Sig Documenting Provider Last Dose Status Informant  Calcium Carbonate-Vit D-Min (CALCIUM 1200 PO) [id number] Yes Take by mouth. [provider] Taking Active   cholecalciferol (VITAMIN D) 1000 units tablet [id number] Yes Take 2,000 Units by mouth daily. [provider] Taking Active Self  CYANOCOBALAMIN PO [id number] Yes Take 1 tablet by mouth daily.  [provider] Taking Active Self  diclofenac sodium (VOLTAREN) 1 % GEL [id number] Yes Apply 4 g topically 4 (four) times daily. To affected joint.  Patient taking differently: Apply 4 g topically as needed. To affected joint.   Gregor Hams, MD Taking Active Self  Multiple Vitamin (MULTIVITAMIN) tablet [id number] Yes Take 1 tablet by mouth daily.   [provider] Taking Active Self  polyethylene glycol (MIRALAX / GLYCOLAX) 17 g packet [id number] Yes Take 17 g by mouth daily. [provider] Taking Active   pyridoxine (B-6) 100 MG tablet [id number] Yes Take 100 mg by mouth daily.  [provider] Taking Active Self  rosuvastatin (CRESTOR) 10 MG tablet [id number] Yes Take 1 tablet (10 mg total) by mouth once a week. [name], MD Taking Active          Patient Active Problem List   Diagnosis Date Noted  . Choledocholithiasis with obstruction 09/03/2020  . Abnormal cholangiogram   . Acute cholecystitis 07/29/2020  . Senile purpura ([place]) 01/25/2020  . History of septic arthritis 07/26/2018  . Cellulitis of left breast 03/28/2017  . Hyperlipidemia 02/02/2017  . Hyperglycemia 03/19/2015  . Osteoporosis 01/20/2014  . History of breast cancer 06/30/2013  . Essential hypertension 08/03/2008  . SKIN CANCER, HX OF 08/03/2008    Immunization History  Administered Date(s) Administered  . Fluad Quad(high Dose 65+) 07/28/2019, 07/13/2020  . Influenza Split 07/10/2011, 07/29/2012, 08/04/2013  . Influenza Whole 07/18/2009  . Influenza, High Dose Seasonal PF 08/15/2015, 07/02/2017, 07/26/2018  . Influenza-Unspecified 07/31/2014  . PFIZER(Purple Top)SARS-COV-2 Vaccination 10/27/2019, 11/17/2019, 05/26/2020  . Pneumococcal Conjugate-13 11/15/2013  . Pneumococcal Polysaccharide-23 11/18/2005  . Td 08/02/2002  . Tdap 10/20/2012  . Zoster 02/21/2009  . Zoster Recombinat (Shingrix) 07/26/2018, 09/30/2018   Conditions to be addressed/monitored:  Hypertension, Hyperlipidemia and Osteopenia Care Plan : [place]  Updates made by [name], [facility] since 11/26/2020 12:00 AM    Problem: Hypertension, Hyperlipidemia and Osteopenia     Long-Range Goal: Disease state management   Start Date: 11/26/2020  Expected End Date: 11/26/2021  This Visit's Progress: On track  Priority: High  Note:   Hypertension (BP goal <140/90) -controlled -Current treatment: . n/a -Medications  previously tried: lisinopril  -Current home readings: checking over couple of weeks- 136/66 last week. -Current dietary habits: weight watchers meal plan but has held off - stress and stress eating has been ongoing problem, husband has experienced cognitive decline past two years and has DM/other conditions. She is concerned that she might bring home COVID-19 to him so this prevents her from getting out more. Declines additional support at this time. -Current exercise habits: yardwork, cubii exercise machine, has additional home gym equipment.  -Denies hypotensive/hypertensive symptoms -Educated on BP goals and benefits of medications for prevention of heart attack, stroke and kidney damage; Daily salt intake goal < 2300 mg; Exercise goal of 150 minutes per week; -Counseled to monitor BP at home every other week, document, and provide log at  future appointments -Counseled on diet and exercise extensively  CPA BP check in 2 months.   Hyperlipidemia: (LDL goal < 70) -uncontrolled -Current treatment: . Rosuvastatin 10 mg once weekly- restarted 08/2020 -Medications previously tried: rosuvastatin 10 mg once/twice weekly  -Current exercise habits: see htn -Educated on Cholesterol goals;  Benefits of statin for ASCVD risk reduction; Importance of limiting foods high in cholesterol; Exercise goal of 150 minutes per week; -Counseled on diet and exercise extensively Recommended to continue current medication     Medication Assistance: None required.  Patient affirms current coverage meets needs. Patient's preferred pharmacy is:  [facility] [address], [place] - [address] [place] [place] [address] Phone: [phone] Fax: [phone]  Patient decided to: Continue current medication management strategy Patient agrees to Care Plan and Follow-up.  Current Barriers:  . Need to optimize diet/exercise  Pharmacist Clinical Goal(s):  Marland Kitchen Over the next 365 days, patient will verbalize ability to afford treatment regimen . contact provider office for questions/concerns as evidenced notation of same in electronic health record through collaboration with PharmD and provider.   Interventions: . 1:1 collaboration with [name], MD regarding development and update of comprehensive plan of care as evidenced by provider attestation and co-signature . Inter-disciplinary care team collaboration (see longitudinal plan of care) . Comprehensive medication review performed; medication list updated in electronic medical record  Patient Goals/Self-Care Activities . Over the next 365 days, patient will:  - take medications as prescribed check blood pressure every couple of weeks, document, and provide at future appointments  Future Appointments  Date Time Provider [place]  02/13/2021   8:00 AM [name], MD [facility]  05/08/2021 11:00 AM [facility] CCM PHARMACIST [facility]   Follow-up plan with Care Management Team: . CPA: 2 month BP call, ask about success of restarting weight watchers . RPH: 6 month telephone visit  [name], Pharm.D., BCGP Clinical Pharmacist [place] [phone]

## 2020-11-26 ENCOUNTER — Ambulatory Visit (INDEPENDENT_AMBULATORY_CARE_PROVIDER_SITE_OTHER): Payer: Medicare HMO

## 2020-11-26 DIAGNOSIS — E785 Hyperlipidemia, unspecified: Secondary | ICD-10-CM | POA: Diagnosis not present

## 2020-11-26 DIAGNOSIS — I10 Essential (primary) hypertension: Secondary | ICD-10-CM | POA: Diagnosis not present

## 2020-11-26 NOTE — Patient Instructions (Signed)
Briana Smith,  Thank you for taking the time to review your medications with me today.  I have included our care plan/goals in the following pages. Please review and call me at [phone] with any questions!  Thanks! Briana Smith, Pharm.D., BCGP Clinical Pharmacist [facility] [place] [phone] Patient Care Plan: [place] Plan    Problem Identified: Hypertension, Hyperlipidemia and Osteopenia     Long-Range Goal: Disease state management   Start Date: 11/26/2020  Expected End Date: 11/26/2021  This Visit's Progress: On track  Priority: High  Note:   Hypertension (BP goal <140/90) -controlled -Current treatment: . n/a -Medications previously tried: lisinopril  -Current home readings: checking over couple of weeks- 136/66 last week. -Current dietary habits: weight watchers meal plan but has held off - stress and stress eating has been ongoing problem, husband has experienced cognitive decline past two years and has DM/other conditions. She is concerned that she might bring home COVID-19 to him so this prevents her from getting out more. Declines additional support at this time. -Current exercise habits: yardwork, cubii exercise machine, has additional home gym equipment.  -Denies hypotensive/hypertensive symptoms -Educated on BP goals and benefits of medications for prevention of heart attack, stroke and kidney damage; Daily salt intake goal < 2300 mg; Exercise goal of 150 minutes per week; -Counseled to monitor BP at home every other week, document, and provide log at future appointments -Counseled on diet and exercise extensively  CPA BP check in 2 months.   Hyperlipidemia: (LDL goal < 70) -uncontrolled -Current treatment: . Rosuvastatin 10 mg once weekly- restarted 08/2020 -Medications previously tried: rosuvastatin 10 mg once/twice weekly  -Current exercise habits: see htn -Educated on Cholesterol goals;   Benefits of statin for ASCVD risk reduction; Importance of limiting foods high in cholesterol; Exercise goal of 150 minutes per week; -Counseled on diet and exercise extensively Recommended to continue current medication      The patient verbalized understanding of instructions provided today and agreed to receive a Mailed copy of patient instruction and/or educational materials. Telephone follow up appointment with pharmacy team member scheduled for: See next appointment with "Care Management Staff" under "What's Next" below.

## 2020-12-27 ENCOUNTER — Telehealth: Payer: Self-pay | Admitting: Family Medicine

## 2020-12-27 NOTE — Telephone Encounter (Signed)
Left message for patient to call back and schedule Medicare Annual Wellness Visit (AWV) either virtually or in office. No detailed message left   Last AWV 05/10/2019  please schedule at anytime   This should be a 45 minute visit.

## 2021-01-10 DIAGNOSIS — H5203 Hypermetropia, bilateral: Secondary | ICD-10-CM | POA: Diagnosis not present

## 2021-01-24 ENCOUNTER — Telehealth: Payer: Self-pay

## 2021-01-24 NOTE — Chronic Care Management (AMB) (Signed)
    Chronic Care Management Pharmacy Assistant   Name: Briana Smith  MRN: [id number] DOB: 1941-04-04  Reason for Encounter: General Adherence Call  Recent office visits:  No visits noted  Recent consult visits:  No visits noted  Hospital visits:  None in previous 6 months  Medications: Outpatient Encounter Medications as of 01/24/2021  Medication Sig  . Calcium Carbonate-Vit D-Min (CALCIUM 1200 PO) Take by mouth.  . cholecalciferol (VITAMIN D) 1000 units tablet Take 2,000 Units by mouth daily.  . CYANOCOBALAMIN PO Take 1 tablet by mouth daily.   . diclofenac sodium (VOLTAREN) 1 % GEL Apply 4 g topically 4 (four) times daily. To affected joint. (Patient taking differently: Apply 4 g topically as needed. To affected joint.)  . Multiple Vitamin (MULTIVITAMIN) tablet Take 1 tablet by mouth daily.    . polyethylene glycol (MIRALAX / GLYCOLAX) 17 g packet Take 17 g by mouth daily.  Marland Kitchen pyridoxine (B-6) 100 MG tablet Take 100 mg by mouth daily.   . rosuvastatin (CRESTOR) 10 MG tablet Take 1 tablet (10 mg total) by mouth once a week.   No facility-administered encounter medications on file as of 01/24/2021.   Patient stated that she has been doing ok. She was at home watching tv when I called. Stated that all of her medications have been the same and her blood pressures remain "good". I asked her about her re- enrollment to weight watchers, but she has not rejoined as yet. She has not been back since christmas and says it is because she wanted a break from dieting after 50 years of ww subscription. She stated that she does get exercise at least  3 x weekly at home using a stationary bike, free weights, and a treadmill. 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 She stated she will be restarting the program soon as she knows she needs to. I also spoke with her regarding her husband. She states he is doing well and his  providers still remain unclear of his dx.  Star Rating Drugs: Rosuvastatin 10 mg- 90 DS last filled 08/16/20 patient stated that sometimes she forgets to take this. She also has been taking BOTH 20 mg and 10 mg alternatively. 3 tabs - 10 MG on hand 10 tabs - 20 MG on hand  [employer], CMA

## 2021-02-12 NOTE — Patient Instructions (Addendum)
Please stop by lab before you go If you have mychart- we will send your results within 3 business days of [place] receiving them.  If you do not have mychart- we will call you about results within 5 business days of [place] receiving them.  *please also note that you will see labs on mychart as soon as they post. I will later go in and write notes on them- will say "notes from Dr. Yong Channel"  No changes today unless labs lead [place] to make changes Probably will not change cholesterol medicine unless LDL above 160   You are eligible to schedule your annual wellness visit with our nurse specialist Otila Kluver.  Please consider scheduling this before you leave today- perhaps could do that at 6 months to touch base  Recommended follow up: Return in about 1 year (around 02/13/2022) for physical or sooner if needed.

## 2021-02-13 ENCOUNTER — Other Ambulatory Visit: Payer: Self-pay

## 2021-02-13 ENCOUNTER — Ambulatory Visit (INDEPENDENT_AMBULATORY_CARE_PROVIDER_SITE_OTHER): Payer: Medicare HMO | Admitting: Family Medicine

## 2021-02-13 ENCOUNTER — Encounter: Payer: Self-pay | Admitting: Family Medicine

## 2021-02-13 VITALS — BP 134/88 | HR 65 | Temp 97.8°F | Ht 60.0 in | Wt 180.8 lb

## 2021-02-13 DIAGNOSIS — I10 Essential (primary) hypertension: Secondary | ICD-10-CM | POA: Diagnosis not present

## 2021-02-13 DIAGNOSIS — D692 Other nonthrombocytopenic purpura: Secondary | ICD-10-CM

## 2021-02-13 DIAGNOSIS — E785 Hyperlipidemia, unspecified: Secondary | ICD-10-CM

## 2021-02-13 DIAGNOSIS — Z0000 Encounter for general adult medical examination without abnormal findings: Secondary | ICD-10-CM

## 2021-02-13 DIAGNOSIS — R739 Hyperglycemia, unspecified: Secondary | ICD-10-CM | POA: Diagnosis not present

## 2021-02-13 DIAGNOSIS — R350 Frequency of micturition: Secondary | ICD-10-CM

## 2021-02-13 DIAGNOSIS — R82998 Other abnormal findings in urine: Secondary | ICD-10-CM

## 2021-02-13 DIAGNOSIS — R319 Hematuria, unspecified: Secondary | ICD-10-CM

## 2021-02-13 LAB — CBC WITH DIFFERENTIAL/PLATELET
Basophils Absolute: 0.1 10*3/uL (ref 0.0–0.1)
Basophils Relative: 1.1 % (ref 0.0–3.0)
Eosinophils Absolute: 0.1 10*3/uL (ref 0.0–0.7)
Eosinophils Relative: 2.4 % (ref 0.0–5.0)
HCT: 40.8 % (ref 36.0–46.0)
Hemoglobin: 13.5 g/dL (ref 12.0–15.0)
Lymphocytes Relative: 12 % (ref 12.0–46.0)
Lymphs Abs: 0.6 10*3/uL — ABNORMAL LOW (ref 0.7–4.0)
MCHC: 33.1 g/dL (ref 30.0–36.0)
MCV: 92.2 fl (ref 78.0–100.0)
Monocytes Absolute: 0.4 10*3/uL (ref 0.1–1.0)
Monocytes Relative: 7.5 % (ref 3.0–12.0)
Neutro Abs: 4.1 10*3/uL (ref 1.4–7.7)
Neutrophils Relative %: 77 % (ref 43.0–77.0)
Platelets: 147 10*3/uL — ABNORMAL LOW (ref 150.0–400.0)
RBC: 4.42 Mil/uL (ref 3.87–5.11)
RDW: 14.1 % (ref 11.5–15.5)
WBC: 5.3 10*3/uL (ref 4.0–10.5)

## 2021-02-13 LAB — POC URINALSYSI DIPSTICK (AUTOMATED)
Bilirubin, UA: NEGATIVE
Blood, UA: POSITIVE
Glucose, UA: NEGATIVE
Ketones, UA: NEGATIVE
Nitrite, UA: NEGATIVE
Protein, UA: NEGATIVE
Spec Grav, UA: >=1.03 — AB (ref 1.010–1.025)
Urobilinogen, UA: 0.2 E.U./dL
pH, UA: 6 (ref 5.0–8.0)

## 2021-02-13 LAB — COMPREHENSIVE METABOLIC PANEL
ALT: 19 U/L (ref 0–35)
AST: 18 U/L (ref 0–37)
Albumin: 4.2 g/dL (ref 3.5–5.2)
Alkaline Phosphatase: 51 U/L (ref 39–117)
BUN: 20 mg/dL (ref 6–23)
CO2: 32 mEq/L (ref 19–32)
Calcium: 9.3 mg/dL (ref 8.4–10.5)
Chloride: 104 mEq/L (ref 96–112)
Creatinine, Ser: 0.94 mg/dL (ref 0.40–1.20)
GFR: 56.61 mL/min — ABNORMAL LOW (ref >60.00)
Glucose, Bld: 105 mg/dL — ABNORMAL HIGH (ref 70–99)
Potassium: 4.2 mEq/L (ref 3.5–5.1)
Sodium: 141 mEq/L (ref 135–145)
Total Bilirubin: 0.6 mg/dL (ref 0.2–1.2)
Total Protein: 7.1 g/dL (ref 6.0–8.3)

## 2021-02-13 LAB — LIPID PANEL
Cholesterol: 222 mg/dL — ABNORMAL HIGH (ref 0–200)
HDL: 61.9 mg/dL (ref >39.00)
LDL Cholesterol: 141 mg/dL — ABNORMAL HIGH (ref 0–99)
NonHDL: 160.24
Total CHOL/HDL Ratio: 4
Triglycerides: 97 mg/dL (ref 0.0–149.0)
VLDL: 19.4 mg/dL (ref 0.0–40.0)

## 2021-02-13 LAB — HEMOGLOBIN A1C: Hgb A1c MFr Bld: 6.2 % (ref 4.6–6.5)

## 2021-02-13 NOTE — Addendum Note (Signed)
Addended by: Jacob Moores on: 02/13/2021 09:03 AM   Modules accepted: Orders

## 2021-02-13 NOTE — Progress Notes (Signed)
Phone [phone]   Subjective:  Patient presents today for their annual physical. Chief complaint-noted.   See problem oriented charting- ROS- full  review of systems was completed and negative except for: runny nose, sneezing, sore throat- PND/allergies, shortness of breath- if rushes quickly to phone- will have to catch her breath to talk more normally- not bad, constipation- unchanged- uses miralax, urinary frequency, urgency- some incontinence, easy bruising/bleeding  The following were reviewed and entered/updated in epic: Past Medical History:  Diagnosis Date  . Breast cancer ([place])   . Cancer [facility])    breast cancer- left  . Heart murmur    years ago. no recent issues  . Hyperlipidemia   . Hypertension    per pt treated in past  . Osteoporosis 01/20/2014   (5) DEXA scan 08/31/2013 shows osteopenia with a T score of -2.0; zolendronate started April 2015, to be repeated every 6 months while on anastrozole.  2016 dexa stable. Repeat 2019  . Personal history of chemotherapy   . Personal history of radiation therapy    Patient Active Problem List   Diagnosis Date Noted  . History of septic arthritis 07/26/2018    Priority: High  . Osteoporosis 01/20/2014    Priority: High  . History of breast cancer 06/30/2013    Priority: High  . Hyperlipidemia 02/02/2017    Priority: Medium  . Hyperglycemia 03/19/2015    Priority: Medium  . Essential hypertension 08/03/2008    Priority: Medium  . Cellulitis of left breast 03/28/2017    Priority: Low  . SKIN CANCER, HX OF 08/03/2008    Priority: Low  . Choledocholithiasis with obstruction 09/03/2020  . Abnormal cholangiogram   . Acute cholecystitis 07/29/2020  . Senile purpura ([place]) 01/25/2020   Past Surgical History:  Procedure Laterality Date  . ABDOMINAL HYSTERECTOMY  1987   fibroids  . BREAST BIOPSY    . BREAST LUMPECTOMY  10/23/10   left  . CHOLECYSTECTOMY N/A 07/30/2020   Procedure: LAPAROSCOPIC CHOLECYSTECTOMY WITH  INTRAOPERATIVE CHOLANGIOGRAM AND ICG DYE;  Surgeon: Donnie Mesa, MD;  Location: [facility];  Service: General;  Laterality: N/A;  . ENDOSCOPIC RETROGRADE CHOLANGIOPANCREATOGRAPHY (ERCP) WITH PROPOFOL N/A 08/01/2020   Procedure: ENDOSCOPIC RETROGRADE CHOLANGIOPANCREATOGRAPHY (ERCP) WITH PROPOFOL;  Surgeon: Milus Banister, MD;  Location: [facility] ENDOSCOPY;  Service: Endoscopy;  Laterality: N/A;  . PANCREATIC STENT PLACEMENT  08/01/2020   Procedure: PANCREATIC STENT PLACEMENT;  Surgeon: Milus Banister, MD;  Location: [facility] ENDOSCOPY;  Service: Endoscopy;;  . PORT-A-CATH REMOVAL  09/02/2011   Procedure: REMOVAL PORT-A-CATH;  Surgeon: Adin Hector, MD;  Location: [place];  Service: General;  Laterality: Right;  . REMOVAL OF STONES  08/01/2020   Procedure: REMOVAL OF STONES;  Surgeon: Milus Banister, MD;  Location: [facility] ENDOSCOPY;  Service: Endoscopy;;  . Joan Mayans  08/01/2020   Procedure: Joan Mayans;  Surgeon: Milus Banister, MD;  Location: [facility] ENDOSCOPY;  Service: Endoscopy;;  . TOTAL HIP ARTHROPLASTY Left 04/03/2017   Procedure: IRRIGATION AND [place] LEFT HIP;  Surgeon: Rod Can, MD;  Location: [facility];  Service: Orthopedics;  Laterality: Left;    Family History  Problem Relation Age of Onset  . COPD Father   . Diabetes Mother   . Dementia Mother   . Kidney disease Mother        not on dialysis  . Cancer Brother        jaw  . Hypertension Brother   . Breast cancer Paternal Aunt     Medications-  reviewed and updated Current Outpatient Medications  Medication Sig Dispense Refill  . Calcium Carbonate-Vit D-Min (CALCIUM 1200 PO) Take by mouth.    . cholecalciferol (VITAMIN D) 1000 units tablet Take 2,000 Units by mouth daily.    . CYANOCOBALAMIN PO Take 1 tablet by mouth daily.     . Multiple Vitamin (MULTIVITAMIN) tablet Take 1 tablet by mouth daily.    . polyethylene glycol (MIRALAX / GLYCOLAX) 17 g packet Take 17 g by mouth daily.    . rosuvastatin  (CRESTOR) 10 MG tablet Take 1 tablet (10 mg total) by mouth once a week. 13 tablet 3  . diclofenac sodium (VOLTAREN) 1 % GEL Apply 4 g topically 4 (four) times daily. To affected joint. (Patient not taking: Reported on 02/13/2021) 100 g 11  . pyridoxine (B-6) 100 MG tablet Take 100 mg by mouth daily.  (Patient not taking: Reported on 02/13/2021)     No current facility-administered medications for this visit.    Allergies-reviewed and updated No Known Allergies  Social History   Social History Narrative   Married. Lives with husband. 3 children. 3 grandkids (22, 22, 18 in 2018).       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       Hobbies: working puzzles, reading, shoping, beach   Objective  Objective:  BP 134/88   Pulse 65   Temp 97.8 F (36.6 C) (Temporal)   Ht 5' (1.524 m)   Wt 180 lb 12.8 oz (82 kg)   SpO2 98%   BMI 35.31 kg/m  Gen: NAD, resting comfortably HEENT: Mucous membranes are moist. Oropharynx normal Neck: no thyromegaly CV: RRR no murmurs rubs or gallops Lungs: CTAB no crackles, wheeze, rhonchi Breasts: normal appearance other than left lumpectomy site, no masses or tenderness Abdomen: soft/nontender/nondistended/normal bowel sounds. No rebound or guarding.  Ext: no edema Skin: warm, dry Neuro: grossly normal, moves all extremities, PERRLA   Assessment and Plan   80 y.o. female presenting for annual physical.  Health Maintenance counseling: 1. Anticipatory guidance: Patient counseled regarding regular dental exams -q6 months- just had crown put on yesterday, eye exams - yearly about 3 weeks ago,  avoiding smoking and second hand smoke , limiting alcohol to 1 beverage per day- doesn't drink, no illicit drugs .   2. Risk factor reduction:  Advised patient of need for regular exercise and diet rich and fruits and vegetables to reduce risk of heart attack and stroke. Exercise- walking has been down lately- doing some [place] more now daily 15-30 minutes.  Diet-weight up slightly but had big trip to [place]- and a lot of eating out- plans to work back down to lower 170s.  Wt Readings from Last 3 Encounters:  02/13/21 180 lb 12.8 oz (82 kg)  09/03/20 176 lb (79.8 kg)  08/15/20 169 lb 6.4 oz (76.8 kg)  3. Immunizations/screenings/ancillary studies-discussed COVID vaccination #4- she is considering waiting until the fall , discussed Prevnar 20 new recommendations- opts in but not feeling 100% after trip and we opted to wait until next visit Immunization History  Administered Date(s) Administered  . Fluad Quad(high Dose 65+) 07/28/2019, 07/13/2020  . Influenza Split 07/10/2011, 07/29/2012, 08/04/2013  . Influenza Whole 07/18/2009  . Influenza, High Dose Seasonal PF 08/15/2015, 07/02/2017, 07/26/2018  . Influenza-Unspecified 07/31/2014  . PFIZER(Purple Top)SARS-COV-2 Vaccination 10/27/2019, 11/17/2019, 05/26/2020  . Pneumococcal Conjugate-13 11/15/2013  . Pneumococcal Polysaccharide-23 11/18/2005  . Td 08/02/2002  . Tdap 10/20/2012  . Zoster 02/21/2009  . Zoster  Recombinat (Shingrix) 07/26/2018, 09/30/2018  4. Cervical cancer screening-past age based screening recommendations.  Never had abnormal Pap smear 5. Breast cancer screening-  breast exam - opts in today-  and mammogram-see below 6. Colon cancer screening - colonoscopy 2009- no further colonoscopy needed for age based screening 7. Skin cancer screening-has seen Dr. [place] Crane in the past- seen in the fall- has follow up planned. advised regular sunscreen use. Denies worrisome, changing, or new skin lesions - other than af ew nose spots she is going to see him for 8. Birth control/STD check-postmenopausal/monogamous 9. Osteoporosis screening at 65-see discussion below -Neversmoker  Status of chronic or acute concerns   #History of breast cancer 2012 -treated with lumpectomy and chemotherapy.  Completed 5 years of antiestrogen therapy 12/24/2015.  Continues yearly mammograms-last completed  12/07/20  #hypertension S: in 2020 and 2021 BP trending down- has been able to come off BP medications. Most recently lisinopril stopped before 01/25/20 visit BP Readings from Last 3 Encounters:  02/13/21 134/88  09/03/20 140/84  08/15/20 128/82  Home #s: 130s/60s or so A/P: Stable. Continue without medication.    #hyperlipidemia S: compliant with rosuvastatin 20 mg oncea week-has had a trial off due to myalgias in the past.  Cautious about increasing due to prediabetes.  A/P: at her age would not be aggressive about medicine. Especially with prediabetes- will likely continue current dose- unless perhaps LDL above 160- it was last time Lab Results  Component Value Date   CHOL 251 (H) 08/15/2020   HDL 58 08/15/2020   LDLCALC 165 (H) 08/15/2020   LDLDIRECT 141.7 11/08/2013   TRIG 139 08/15/2020   CHOLHDL 4.3 08/15/2020    # Hyperglycemia/insulin resistance/prediabetes-peak A1c 6.2 S: Medication:  none  Lab Results  Component Value Date   HGBA1C 6.2 (H) 07/30/2020   HGBA1C 6.0 01/25/2020   HGBA1C 6.2 07/28/2019   A/P: hopefully stable- update today   #Osteoporosis S: Compliant with Fosamax weekly previously up through last visit but was having arthralgias which improved off medicine .  She had been on Reclast in the past when on anastrozole.  Last bone density 08/2020 improved  Patient is compliant with calcium and vitamin D   A/P: hopefully stable- update dexa next fall. As not been able to tolerate fosamax due to arthralgias- suspect could have similar on reclast   #Burning sensation in the back of the nose and mild scratchy throat- a lot of cold in the hotel this weekend -windows down in car as well on trip- offered covid testing- she declined   Recommended follow up: Return in about 1 year (around 02/13/2022) for physical or sooner if needed. awv 6 months declines Future Appointments  Date Time Provider [place]  05/08/2021 11:00 AM [facility] CCM PHARMACIST [facility]    Lab/Order associations: fasting   ICD-10-CM   1. Preventative health care  Z00.00   2. Essential hypertension  I10   3. Hyperlipidemia, unspecified hyperlipidemia type  E78.5 CBC with Differential/Platelet    Comprehensive metabolic panel    Lipid panel  4. Hyperglycemia  R73.9 Hemoglobin A1c  5. Urinary frequency  R35.0 POCT Urinalysis Dipstick (Automated)  6. Senile purpura (HCC) Chronic. Noted/stable. Check cbc D69.2     No orders of the defined types were placed in this encounter.  Return precautions advised.  Garret Reddish, MD

## 2021-03-04 DIAGNOSIS — Z809 Family history of malignant neoplasm, unspecified: Secondary | ICD-10-CM | POA: Diagnosis not present

## 2021-03-04 DIAGNOSIS — K219 Gastro-esophageal reflux disease without esophagitis: Secondary | ICD-10-CM | POA: Diagnosis not present

## 2021-03-04 DIAGNOSIS — E785 Hyperlipidemia, unspecified: Secondary | ICD-10-CM | POA: Diagnosis not present

## 2021-03-04 DIAGNOSIS — Z008 Encounter for other general examination: Secondary | ICD-10-CM | POA: Diagnosis not present

## 2021-03-04 DIAGNOSIS — Z8249 Family history of ischemic heart disease and other diseases of the circulatory system: Secondary | ICD-10-CM | POA: Diagnosis not present

## 2021-03-04 DIAGNOSIS — Z833 Family history of diabetes mellitus: Secondary | ICD-10-CM | POA: Diagnosis not present

## 2021-03-04 DIAGNOSIS — E669 Obesity, unspecified: Secondary | ICD-10-CM | POA: Diagnosis not present

## 2021-03-04 DIAGNOSIS — R32 Unspecified urinary incontinence: Secondary | ICD-10-CM | POA: Diagnosis not present

## 2021-03-04 DIAGNOSIS — Z6834 Body mass index (BMI) 34.0-34.9, adult: Secondary | ICD-10-CM | POA: Diagnosis not present

## 2021-03-04 DIAGNOSIS — M1990 Unspecified osteoarthritis, unspecified site: Secondary | ICD-10-CM | POA: Diagnosis not present

## 2021-03-04 DIAGNOSIS — I951 Orthostatic hypotension: Secondary | ICD-10-CM | POA: Diagnosis not present

## 2021-03-20 ENCOUNTER — Telehealth: Payer: Self-pay

## 2021-03-20 NOTE — Chronic Care Management (AMB) (Signed)
Chronic Care Management Pharmacy Assistant   Name: Briana Smith  MRN: [id number] DOB: [birth year]  Reason for Encounter: Hypertension Disease State Call  Recent office visits:  02/13/21- Briana Reddish, MD- seen for annual physical, no medication changes, follow up 1 yr  Recent consult visits:  No visits noted  Hospital visits:  None in previous 6 months  Medications: Outpatient Encounter Medications as of 03/20/2021  Medication Sig   Calcium Carbonate-Vit D-Min (CALCIUM 1200 PO) Take by mouth.   cholecalciferol (VITAMIN D) 1000 units tablet Take 2,000 Units by mouth daily.   CYANOCOBALAMIN PO Take 1 tablet by mouth daily.    diclofenac sodium (VOLTAREN) 1 % GEL Apply 4 g topically 4 (four) times daily. To affected joint. (Patient not taking: Reported on 02/13/2021)   Multiple Vitamin (MULTIVITAMIN) tablet Take 1 tablet by mouth daily.   polyethylene glycol (MIRALAX / GLYCOLAX) 17 g packet Take 17 g by mouth daily.   pyridoxine (B-6) 100 MG tablet Take 100 mg by mouth daily.  (Patient not taking: Reported on 02/13/2021)   rosuvastatin (CRESTOR) 10 MG tablet Take 1 tablet (10 mg total) by mouth once a week.   No facility-administered encounter medications on file as of 03/20/2021.    Care Gaps:  Star Rating Drugs: Reviewed chart prior to disease state call. Spoke with patient regarding BP  Recent Office Vitals: BP Readings from Last 3 Encounters:  02/13/21 134/88  09/03/20 140/84  08/15/20 128/82   Pulse Readings from Last 3 Encounters:  02/13/21 65  09/03/20 65  08/15/20 66    Wt Readings from Last 3 Encounters:  02/13/21 180 lb 12.8 oz (82 kg)  09/03/20 176 lb (79.8 kg)  08/15/20 169 lb 6.4 oz (76.8 kg)     Kidney Function Lab Results  Component Value Date/Time   CREATININE 0.94 02/13/2021 08:48 AM   CREATININE 0.86 08/15/2020 09:34 AM   CREATININE 0.78 08/02/2020 01:23 AM   CREATININE 0.88 07/13/2020 09:04 AM   CREATININE 0.9 01/31/2016 10:02 AM    CREATININE 0.9 07/18/2015 10:27 AM   GFR 56.61 (L) 02/13/2021 08:48 AM   GFRNONAA 63 08/15/2020 09:34 AM   GFRAA 73 08/15/2020 09:34 AM    BMP Latest Ref Rng & Units 02/13/2021 08/15/2020 08/02/2020  Glucose 70 - 99 mg/dL 105(H) 110(H) 189(H)  BUN 6 - 23 mg/dL 20 21 13   Creatinine 0.40 - 1.20 mg/dL 0.94 0.86 0.78  BUN/Creat Ratio 6 - 22 (calc) - NOT APPLICABLE -  Sodium 024 - 145 mEq/L 141 142 139  Potassium 3.5 - 5.1 mEq/L 4.2 5.2 3.9  Chloride 96 - 112 mEq/L 104 104 106  CO2 19 - 32 mEq/L 32 30 23  Calcium 8.4 - 10.5 mg/dL 9.3 9.7 8.1(L)   I spoke with Briana Smith briefly today as she was on her way out to go to her hair appointment. She has been doing well since we last spoke. She did have a could for a few weeks but reports feeling better. Even though she didn't rejoin the weight watchers program she has been making changes to her eating habits. Unlike before, she has been limiting the foods that she cooks as that was a factor that led to this change. She cooks moderately and exercises at home with the equipment she has and therefore has seen some improvement. She has been able to stop taking her BP medication and reports feeling good with the change. We discussed her rosuvastatin as well. She is to pick  up a new fill today, but she was a bit concerned about the leg pain she experiences while taking this medication. She reports these occurrences at nighttime when she takes her rosuvastatin. Briana Smith was also concerned about the change in dosage. Rather than taking 20 mg like she's used to, her dosage was decreased to 10 mg and she is unsure why. She also had questions about her kidney function as she was told it should be monitored. She is unsure what it means for her and would like more information if available.   Current antihypertensive regimen:  No current regimen   How often are you checking your Blood Pressure?  Patient stated she checks her BP " from time to time"  Current  home BP readings: 137/67 " a couple weeks ago"  What recent interventions/DTPs have been made by any provider to improve Blood Pressure control since last CPP Visit:  No recent interventions  Any recent hospitalizations or ED visits since last visit with CPP? No  What diet changes have been made to improve Blood Pressure Control?  Patient stated she has been trying to improve her eating habits   What exercise is being done to improve your Blood Pressure Control?  Patient stated she is still maintaining the same exercise regimen  Star Rating Drugs  Rosuvastatin 10 mg- Patient reported going to pick up refill today 06/15  [facility] CPA, CMA

## 2021-04-30 ENCOUNTER — Telehealth: Payer: Self-pay | Admitting: Pharmacist

## 2021-04-30 NOTE — Chronic Care Management (AMB) (Signed)
Chronic Care Management Pharmacy Assistant   Name: Briana Smith  MRN: [id number] DOB: 04/29/1941   Reason for Encounter: Hypertension Disease State Call    Recent office visits:  02/13/2021 [facility] PCP [name], MD; stable chronic follow up, no medication changes indicated.  Recent consult visits:  None  Hospital visits:  None in previous 6 months  Medications: Outpatient Encounter Medications as of 04/30/2021  Medication Sig   Calcium Carbonate-Vit D-Min (CALCIUM 1200 PO) Take by mouth.   cholecalciferol (VITAMIN D) 1000 units tablet Take 2,000 Units by mouth daily.   CYANOCOBALAMIN PO Take 1 tablet by mouth daily.    diclofenac sodium (VOLTAREN) 1 % GEL Apply 4 g topically 4 (four) times daily. To affected joint. (Patient not taking: Reported on 02/13/2021)   Multiple Vitamin (MULTIVITAMIN) tablet Take 1 tablet by mouth daily.   polyethylene glycol (MIRALAX / GLYCOLAX) 17 g packet Take 17 g by mouth daily.   pyridoxine (B-6) 100 MG tablet Take 100 mg by mouth daily.  (Patient not taking: Reported on 02/13/2021)   rosuvastatin (CRESTOR) 10 MG tablet Take 1 tablet (10 mg total) by mouth once a week.   No facility-administered encounter medications on file as of 04/30/2021.   Reviewed chart prior to disease state call. Spoke with patient regarding BP  Recent Office Vitals: BP Readings from Last 3 Encounters:  02/13/21 134/88  09/03/20 140/84  08/15/20 128/82   Pulse Readings from Last 3 Encounters:  02/13/21 65  09/03/20 65  08/15/20 66    Wt Readings from Last 3 Encounters:  02/13/21 180 lb 12.8 oz (82 kg)  09/03/20 176 lb (79.8 kg)  08/15/20 169 lb 6.4 oz (76.8 kg)     Kidney Function Lab Results  Component Value Date/Time   CREATININE 0.94 02/13/2021 08:48 AM   CREATININE 0.86 08/15/2020 09:34 AM   CREATININE 0.78 08/02/2020 01:23 AM   CREATININE 0.88 07/13/2020 09:04 AM   CREATININE 0.9 01/31/2016 10:02 AM   CREATININE 0.9 07/18/2015 10:27 AM    GFR 56.61 (L) 02/13/2021 08:48 AM   GFRNONAA 63 08/15/2020 09:34 AM   GFRAA 73 08/15/2020 09:34 AM    BMP Latest Ref Rng & Units 02/13/2021 08/15/2020 08/02/2020  Glucose 70 - 99 mg/dL 105(H) 110(H) 189(H)  BUN 6 - 23 mg/dL '20 21 13  '$ Creatinine 0.40 - 1.20 mg/dL 0.94 0.86 0.78  BUN/Creat Ratio 6 - 22 (calc) - NOT APPLICABLE -  Sodium A999333 - 145 mEq/L 141 142 139  Potassium 3.5 - 5.1 mEq/L 4.2 5.2 3.9  Chloride 96 - 112 mEq/L 104 104 106  CO2 19 - 32 mEq/L 32 30 23  Calcium 8.4 - 10.5 mg/dL 9.3 9.7 8.1(L)    Current antihypertensive regimen:    How often are you checking your Blood Pressure?   Current home BP readings:   What recent interventions/DTPs have been made by any provider to improve Blood Pressure control since last CPP Visit:   Any recent hospitalizations or ED visits since last visit with CPP?   What diet changes have been made to improve Blood Pressure Control?   What exercise is being done to improve your Blood Pressure Control?    Adherence Review: Is the patient currently on ACE/ARB medication?  Does the patient have >5 day gap between last estimated fill dates?     Star Rating Drugs: Rosuvastatin 10 mg last filled 03/19/2021 90 DS  Unsuccessful attempt at reaching patient to complete adherence call. Chart review  completed.  April D Calhoun, [place] Pharmacist Assistant [phone]

## 2021-05-08 ENCOUNTER — Telehealth: Payer: Medicare HMO

## 2021-05-09 ENCOUNTER — Telehealth: Payer: Self-pay | Admitting: Pharmacist

## 2021-05-09 NOTE — Chronic Care Management (AMB) (Addendum)
Chronic Care Management Pharmacy Assistant   Name: Briana Smith  MRN: [id number] DOB: 30-May-1941  Reason for Encounter: General Adherence Disease State Call and Reschedule Appointment with Clinical Pharmacist    Recent office visits:  02/13/2021 [facility] (PCP) [name], MD; Annual Exam, stable chronic follow up, no medication changes indicated.  Recent consult visits:  None  Hospital visits:  None in previous 6 months  Medications: Outpatient Encounter Medications as of 05/09/2021  Medication Sig   Calcium Carbonate-Vit D-Min (CALCIUM 1200 PO) Take by mouth.   cholecalciferol (VITAMIN D) 1000 units tablet Take 2,000 Units by mouth daily.   CYANOCOBALAMIN PO Take 1 tablet by mouth daily.    diclofenac sodium (VOLTAREN) 1 % GEL Apply 4 g topically 4 (four) times daily. To affected joint. (Patient not taking: Reported on 02/13/2021)   Multiple Vitamin (MULTIVITAMIN) tablet Take 1 tablet by mouth daily.   polyethylene glycol (MIRALAX / GLYCOLAX) 17 g packet Take 17 g by mouth daily.   pyridoxine (B-6) 100 MG tablet Take 100 mg by mouth daily.  (Patient not taking: Reported on 02/13/2021)   rosuvastatin (CRESTOR) 10 MG tablet Take 1 tablet (10 mg total) by mouth once a week.   No facility-administered encounter medications on file as of 05/09/2021.   Reviewed chart prior to disease state call. Spoke with patient regarding BP  Recent Office Vitals: BP Readings from Last 3 Encounters:  02/13/21 134/88  09/03/20 140/84  08/15/20 128/82   Pulse Readings from Last 3 Encounters:  02/13/21 65  09/03/20 65  08/15/20 66    Wt Readings from Last 3 Encounters:  02/13/21 180 lb 12.8 oz (82 kg)  09/03/20 176 lb (79.8 kg)  08/15/20 169 lb 6.4 oz (76.8 kg)     Kidney Function Lab Results  Component Value Date/Time   CREATININE 0.94 02/13/2021 08:48 AM   CREATININE 0.86 08/15/2020 09:34 AM   CREATININE 0.78 08/02/2020 01:23 AM   CREATININE 0.88 07/13/2020 09:04 AM    CREATININE 0.9 01/31/2016 10:02 AM   CREATININE 0.9 07/18/2015 10:27 AM   GFR 56.61 (L) 02/13/2021 08:48 AM   GFRNONAA 63 08/15/2020 09:34 AM   GFRAA 73 08/15/2020 09:34 AM    BMP Latest Ref Rng & Units 02/13/2021 08/15/2020 08/02/2020  Glucose 70 - 99 mg/dL 105(H) 110(H) 189(H)  BUN 6 - 23 mg/dL '20 21 13  '$ Creatinine 0.40 - 1.20 mg/dL 0.94 0.86 0.78  BUN/Creat Ratio 6 - 22 (calc) - NOT APPLICABLE -  Sodium A999333 - 145 mEq/L 141 142 139  Potassium 3.5 - 5.1 mEq/L 4.2 5.2 3.9  Chloride 96 - 112 mEq/L 104 104 106  CO2 19 - 32 mEq/L 32 30 23  Calcium 8.4 - 10.5 mg/dL 9.3 9.7 8.1(L)    I called and spoke with Briana Smith. She states she is overall doing well. She denies having any concerns about her health at this time. She states she is checking her blood pressure and blood sugar daily. She reports a recent blood glucose of 82 and 86. She states her blood pressures are always normal at 124/62, 120/70 and 127/67. She has no issues or problems with her medications or pharmacy at this time.  Patient scheduled an appointment with the clinical pharmacist for Monday 05/13/2021 at 1:00 pm.  Future Appointments  Date Time Provider [place]  05/13/2021  1:00 PM [facility] CCM PHARMACIST [facility]  02/14/2022  8:20 AM [name], MD [facility]  Star Rating Drugs: Rosuvastatin 10 mg last filled 03/19/2021 90 DS  April D Calhoun, [place] Pharmacist Assistant [phone]

## 2021-05-13 ENCOUNTER — Ambulatory Visit (INDEPENDENT_AMBULATORY_CARE_PROVIDER_SITE_OTHER): Payer: Medicare HMO | Admitting: Pharmacist

## 2021-05-13 DIAGNOSIS — E785 Hyperlipidemia, unspecified: Secondary | ICD-10-CM

## 2021-05-13 DIAGNOSIS — I10 Essential (primary) hypertension: Secondary | ICD-10-CM | POA: Diagnosis not present

## 2021-05-13 NOTE — Progress Notes (Signed)
Chronic Care Management Pharmacy Note  05/14/2021 Name:  Briana Smith MRN:  [id number] DOB:  January 08, 1941  Subjective: Briana Smith is an 80 y.o. year old female who is a primary patient of [name], [name], MD.  The CCM team was consulted for assistance with disease management and care coordination needs.   Engaged with patient by telephone for follow up visit in response to provider referral for pharmacy case management and/or care coordination services.   Consent to Services:  The patient was given information about Chronic Care Management services, agreed to services, and gave verbal consent prior to initiation of services.  Please see initial visit note for detailed documentation.  Patient Care Team: [name], MD as PCP - General (Family Medicine) [name], MD as Consulting Physician (Dermatology) Edythe Clarity, [facility] (Pharmacist)  Recent office visits: 08/15/2020 (PCP): DEXA- osteopenia, major hip >3%. Decided on repeat scan in two years then consider fosamax. Lipids elevated, consider low dose once weekly rosuvastatin 10 mg 07/13/2020 (Dr. Jerline Pain): epigastric abdominal pian  Objective: Lab Results  Component Value Date   CREATININE 0.94 02/13/2021   BUN 20 02/13/2021   GFR 56.61 (L) 02/13/2021   GFRNONAA 63 08/15/2020   GFRAA 73 08/15/2020   NA 141 02/13/2021   K 4.2 02/13/2021   CALCIUM 9.3 02/13/2021   CO2 32 02/13/2021   Lab Results  Component Value Date/Time   HGBA1C 6.2 02/13/2021 08:48 AM   HGBA1C 6.2 (H) 07/30/2020 06:54 AM   GFR 56.61 (L) 02/13/2021 08:48 AM   GFR 63.30 01/25/2020 08:59 AM    Last diabetic Eye exam: No results found for: HMDIABEYEEXA  Last diabetic Foot exam: No results found for: HMDIABFOOTEX  Lab Results  Component Value Date   CHOL 222 (H) 02/13/2021   HDL 61.90 02/13/2021   LDLCALC 141 (H) 02/13/2021   LDLDIRECT 141.7 11/08/2013   TRIG 97.0 02/13/2021   CHOLHDL 4 02/13/2021   Hepatic Function Latest Ref  Rng & Units 02/13/2021 09/03/2020 08/15/2020  Total Protein 6.0 - 8.3 g/dL 7.1 7.4 7.0  Albumin 3.5 - 5.2 g/dL 4.2 4.2 -  AST 0 - 37 U/L '18 19 25  ' ALT 0 - 35 U/L 19 23 49(H)  Alk Phosphatase 39 - 117 U/L 51 62 -  Total Bilirubin 0.2 - 1.2 mg/dL 0.6 0.5 0.8  Bilirubin, Direct 0.0 - 0.3 mg/dL - 0.1 -   Lab Results  Component Value Date/Time   TSH 3.60 07/28/2019 09:27 AM   TSH 1.104 03/28/2017 12:14 PM   TSH 2.99 01/21/2016 08:45 AM   FREET4 0.8 01/19/2009 03:05 PM   CBC Latest Ref Rng & Units 02/13/2021 08/15/2020 07/30/2020  WBC 4.0 - 10.5 K/uL 5.3 5.3 10.4  Hemoglobin 12.0 - 15.0 g/dL 13.5 13.1 13.0  Hematocrit 36.0 - 46.0 % 40.8 40.7 40.3  Platelets 150.0 - 400.0 K/uL 147.0(L) 326 161   No results found for: VD25OH  08/15/2020 (PCP): DEXA- osteopenia, major hip >3%. Decided on repeat scan in two years then consider fosamax  Clinical ASCVD: No  The ASCVD Risk score Mikey Bussing DC Jr., et al., 2013) failed to calculate for the following reasons:   The 2013 ASCVD risk score is only valid for ages 58 to 32    Depression screen PHQ 2/9 02/13/2021 01/25/2020 07/28/2019  Decreased Interest 0 0 0  Down, Depressed, Hopeless 0 0 0  PHQ - 2 Score 0 0 0  Altered sleeping 0 - -  Tired, decreased energy 0 - -  Change in appetite 0 - -  Feeling bad or failure about yourself  0 - -  Trouble concentrating 0 - -  Moving slowly or fidgety/restless 0 - -  Suicidal thoughts 0 - -  PHQ-9 Score 0 - -  Difficult doing work/chores Not difficult at all - -    Social History   Tobacco Use  Smoking Status Never  Smokeless Tobacco Never   BP Readings from Last 3 Encounters:  02/13/21 134/88  09/03/20 140/84  08/15/20 128/82   Pulse Readings from Last 3 Encounters:  02/13/21 65  09/03/20 65  08/15/20 66   Wt Readings from Last 3 Encounters:  02/13/21 180 lb 12.8 oz (82 kg)  09/03/20 176 lb (79.8 kg)  08/15/20 169 lb 6.4 oz (76.8 kg)   Assessment/Interventions: Review of patient past  medical history, allergies, medications, health status, including review of consultants reports, laboratory and other test data, was performed as part of comprehensive evaluation and provision of chronic care management services.   SDOH:  (Social Determinants of Health) assessments and interventions performed: Yes  CCM Care Plan No Known Allergies Medications Reviewed Today     Reviewed by Edythe Clarity, [facility] (Pharmacist) on 05/14/21 at [phone]  Med List Status: <None>   Medication Order Taking? Sig Documenting Provider Last Dose Status Informant  Calcium Carbonate-Vit D-Min (CALCIUM 1200 PO) [id number] Yes Take by mouth. [provider] Taking Active   cholecalciferol (VITAMIN D) 1000 units tablet [id number] Yes Take 2,000 Units by mouth daily. [provider] Taking Active Self  CYANOCOBALAMIN PO [id number] Yes Take 1 tablet by mouth daily.  [provider] Taking Active Self  diclofenac sodium (VOLTAREN) 1 % GEL [id number] Yes Apply 4 g topically 4 (four) times daily. To affected joint. Gregor Hams, MD Taking Active   Multiple Vitamin (MULTIVITAMIN) tablet [id number] Yes Take 1 tablet by mouth daily. [provider] Taking Active Self  polyethylene glycol (MIRALAX / GLYCOLAX) 17 g packet [id number] Yes Take 17 g by mouth daily. [provider] Taking Active   pyridoxine (B-6) 100 MG tablet [id number] Yes Take 100 mg by mouth daily. [provider] Taking Active   rosuvastatin (CRESTOR) 10 MG tablet [id number] Yes Take 1 tablet (10 mg total) by mouth once a week. [name], MD Taking Active            Patient Active Problem List   Diagnosis Date Noted   Choledocholithiasis with obstruction 09/03/2020   Abnormal cholangiogram    Acute cholecystitis 07/29/2020   Senile purpura ([place]) 01/25/2020   History of septic arthritis 07/26/2018   Cellulitis of left breast 03/28/2017   Hyperlipidemia 02/02/2017   Hyperglycemia 03/19/2015    Osteoporosis 01/20/2014   History of breast cancer 06/30/2013   Essential hypertension 08/03/2008   SKIN CANCER, HX OF 08/03/2008   Immunization History  Administered Date(s) Administered   Fluad Quad(high Dose 65+) 07/28/2019, 07/13/2020   Influenza Split 07/10/2011, 07/29/2012, 08/04/2013   Influenza Whole 07/18/2009   Influenza, High Dose Seasonal PF 08/15/2015, 07/02/2017, 07/26/2018   Influenza-Unspecified 07/31/2014   PFIZER(Purple Top)SARS-COV-2 Vaccination 10/27/2019, 11/17/2019, 05/26/2020, 04/22/2021   Pneumococcal Conjugate-13 11/15/2013   Pneumococcal Polysaccharide-23 11/18/2005   Td 08/02/2002   Tdap 10/20/2012   Zoster Recombinat (Shingrix) 07/26/2018, 09/30/2018   Zoster, Live 02/21/2009   Conditions to be addressed/monitored:  Hypertension, Hyperlipidemia and Osteopenia, Pre-DM Care Plan : [place]  Updates made by Edythe Clarity, RPH since 05/14/2021 12:00 AM  Problem: Hypertension, Hyperlipidemia and Osteopenia      Long-Range Goal: Disease state management   Start Date: 11/26/2020  Expected End Date: 11/26/2021  Recent Progress: On track  Priority: High  Note:   Current Barriers:  Elevated LDL cholesterol  Pharmacist Clinical Goal(s):  Over the next 365 days, patient will verbalize ability to afford treatment regimen contact provider office for questions/concerns as evidenced notation of same in electronic health record through collaboration with PharmD and provider.   Interventions: 1:1 collaboration with [name], MD regarding development and update of comprehensive plan of care as evidenced by provider attestation and co-signature Inter-disciplinary care team collaboration (see longitudinal plan of care) Comprehensive medication review performed; medication list updated in electronic medical record Hypertension (BP goal <140/90) -controlled -Current treatment: n/a -Medications previously tried: lisinopril  -Current  home readings: checking over couple of weeks- 136/66 last week. -Current dietary habits: weight watchers meal plan but has held off - stress and stress eating has been ongoing problem, husband has experienced cognitive decline past two years and has DM/other conditions. She is concerned that she might bring home COVID-19 to him so this prevents her from getting out more. Declines additional support at this time. -Current exercise habits: yardwork, cubii exercise machine, has additional home gym equipment.  -Denies hypotensive/hypertensive symptoms -Educated on BP goals and benefits of medications for prevention of heart attack, stroke and kidney damage; Daily salt intake goal < 2300 mg; Exercise goal of 150 minutes per week; -Counseled to monitor BP at home every other week, document, and provide log at future appointments -Counseled on diet and exercise extensively  CPA BP check in 2 months.   Update 05/13/21 124/62, 124/70, 127/67, 135/63 - home readings recently She has not had any dizziness.headaches Still doing her cubii exercise machine Continue current management  Hyperlipidemia: (LDL goal < 70) -uncontrolled -Current treatment: Rosuvastatin 10 mg once weekly- restarted 08/2020 -Medications previously tried: rosuvastatin 10 mg once/twice weekly  -Current exercise habits: see htn -Educated on Cholesterol goals;  Benefits of statin for ASCVD risk reduction; Importance of limiting foods high in cholesterol; Exercise goal of 150 minutes per week; -Counseled on diet and exercise extensively Recommended to continue current medication  Update 05/13/21 LDL elevated in may - has been taking the Crestor weekly since Still tolerating! Recommend recheck lipids at some point before next physical.  If LDL still elevated, could increase the frequency of which she is taking - titrating up slowly to promote tolerance.   Diabetes (A1c goal <5.7) -Not ideally controlled -Current  medications: None -Current home glucose readings fasting glucose:  84, 88, 94, 89  -Denies hypoglycemic/hyperglycemic symptoms -Current exercise: cubii exercise machine -Educated on A1c and blood sugar goals; Exercise goal of 150 minutes per week; Benefits of routine self-monitoring of blood sugar; -Counseled to check feet daily and get yearly eye exams -She has cut back on sugars and carbs and really noticing a difference in her sugar and overall health.  Feels much better!! -Recommended continue current lifestyle mods! -Recheck A1c next OV!.  Patient Goals/Self-Care Activities Over the next 365 days, patient will:  - take medications as prescribed check blood pressure every couple of weeks, document, and provide at future appointment      Medication Assistance: None required.  Patient affirms current coverage meets needs. Patient's preferred pharmacy is:  [facility] [address], [place] - [address] [place] [place] [address] Phone: [phone] Fax: [phone]  Patient decided to: Continue current medication  management strategy Patient agrees to Care Plan and Follow-up.   Future Appointments  Date Time Provider [place]  02/14/2022  8:20 AM [name], MD [facility]   Follow-up plan with Care Management Team: CPA: 2 month BP/DM call - still tolerating statin? RPH: 6 month telephone visit  Beverly Milch, PharmD Clinical Pharmacist [phone]

## 2021-05-14 NOTE — Patient Instructions (Addendum)
Visit Information   Goals Addressed             This Visit's Progress    Track and Manage My Blood Pressure-Hypertension       Timeframe:  Long-Range Goal Priority:  High Start Date:  05/14/21                           Expected End Date:   11/14/21                    Follow Up Date 09/04/21    - check blood pressure weekly - choose a place to take my blood pressure (home, clinic or office, retail store) - write blood pressure results in a log or diary    Why is this important?   You won't feel high blood pressure, but it can still hurt your blood vessels.  High blood pressure can cause heart or kidney problems. It can also cause a stroke.  Making lifestyle changes like losing a little weight or eating less salt will help.  Checking your blood pressure at home and at different times of the day can help to control blood pressure.  If the doctor prescribes medicine remember to take it the way the doctor ordered.  Call the office if you cannot afford the medicine or if there are questions about it.     Notes:        Patient Care Plan: CCM Pharmacy Care Plan     Problem Identified: Hypertension, Hyperlipidemia and Osteopenia      Long-Range Goal: Disease state management   Start Date: 11/26/2020  Expected End Date: 11/26/2021  Recent Progress: On track  Priority: High  Note:   Current Barriers:  Elevated LDL cholesterol  Pharmacist Clinical Goal(s):  Over the next 365 days, patient will verbalize ability to afford treatment regimen contact provider office for questions/concerns as evidenced notation of same in electronic health record through collaboration with PharmD and provider.   Interventions: 1:1 collaboration with [name], MD regarding development and update of comprehensive plan of care as evidenced by provider attestation and co-signature Inter-disciplinary care team collaboration (see longitudinal plan of care) Comprehensive medication review  performed; medication list updated in electronic medical record Hypertension (BP goal <140/90) -controlled -Current treatment: n/a -Medications previously tried: lisinopril  -Current home readings: checking over couple of weeks- 136/66 last week. -Current dietary habits: weight watchers meal plan but has held off - stress and stress eating has been ongoing problem, husband has experienced cognitive decline past two years and has DM/other conditions. She is concerned that she might bring home COVID-19 to him so this prevents her from getting out more. Declines additional support at this time. -Current exercise habits: yardwork, cubii exercise machine, has additional home gym equipment.  -Denies hypotensive/hypertensive symptoms -Educated on BP goals and benefits of medications for prevention of heart attack, stroke and kidney damage; Daily salt intake goal < 2300 mg; Exercise goal of 150 minutes per week; -Counseled to monitor BP at home every other week, document, and provide log at future appointments -Counseled on diet and exercise extensively  CPA BP check in 2 months.   Update 05/13/21 124/62, 124/70, 127/67, 135/63 - home readings recently She has not had any dizziness.headaches Still doing her cubii exercise machine Continue current management  Hyperlipidemia: (LDL goal < 70) -uncontrolled -Current treatment: Rosuvastatin 10 mg once weekly- restarted 08/2020 -Medications previously tried: rosuvastatin 10 mg once/twice  weekly  -Current exercise habits: see htn -Educated on Cholesterol goals;  Benefits of statin for ASCVD risk reduction; Importance of limiting foods high in cholesterol; Exercise goal of 150 minutes per week; -Counseled on diet and exercise extensively Recommended to continue current medication  Update 05/13/21 LDL elevated in may - has been taking the Crestor weekly since Still tolerating! Recommend recheck lipids at some point before next physical.  If  LDL still elevated, could increase the frequency of which she is taking - titrating up slowly to promote tolerance.   Diabetes (A1c goal <5.7) -Not ideally controlled -Current medications: None -Current home glucose readings fasting glucose:  84, 88, 94, 89  -Denies hypoglycemic/hyperglycemic symptoms -Current exercise: cubii exercise machine -Educated on A1c and blood sugar goals; Exercise goal of 150 minutes per week; Benefits of routine self-monitoring of blood sugar; -Counseled to check feet daily and get yearly eye exams -She has cut back on sugars and carbs and really noticing a difference in her sugar and overall health.  Feels much better!! -Recommended continue current lifestyle mods! -Recheck A1c next OV!.  Patient Goals/Self-Care Activities Over the next 365 days, patient will:  - take medications as prescribed check blood pressure every couple of weeks, document, and provide at future appointment      Patient verbalizes understanding of instructions provided today and agrees to view in [place].  Telephone follow up appointment with pharmacy team member scheduled for: 6 months  Briana Smith, [place]

## 2021-05-29 DIAGNOSIS — L570 Actinic keratosis: Secondary | ICD-10-CM | POA: Diagnosis not present

## 2021-05-29 DIAGNOSIS — X32XXXD Exposure to sunlight, subsequent encounter: Secondary | ICD-10-CM | POA: Diagnosis not present

## 2021-05-29 DIAGNOSIS — L820 Inflamed seborrheic keratosis: Secondary | ICD-10-CM | POA: Diagnosis not present

## 2021-06-03 ENCOUNTER — Other Ambulatory Visit: Payer: Self-pay | Admitting: Family Medicine

## 2021-06-07 ENCOUNTER — Telehealth: Payer: Self-pay

## 2021-06-07 ENCOUNTER — Other Ambulatory Visit: Payer: Self-pay

## 2021-06-07 MED ORDER — PANTOPRAZOLE SODIUM 40 MG PO TBEC: 40.0000 mg | DELAYED_RELEASE_TABLET | Freq: Every day | ORAL | 3 refills | Status: DC

## 2021-06-07 MED ORDER — PANTOPRAZOLE SODIUM 40 MG PO TBEC: 40.0000 mg | DELAYED_RELEASE_TABLET | Freq: Every day | ORAL | 0 refills | Status: DC

## 2021-06-07 NOTE — Telephone Encounter (Signed)
.   Encourage patient to contact the pharmacy for refills or they can request refills through [place]:  Please schedule appointment if longer than 1 year  NEXT APPOINTMENT DATE:  MEDICATION:Pantoprazole '40MG'$    Is the patient out of medication?   [place], [place] [phone]

## 2021-06-07 NOTE — Telephone Encounter (Signed)
Spoke to pt told her calling about request for Pantoprazole 40 mg are you taking this? Pt said yes, she started back on it due to having some heartburn at night and it is helping. Pt said it was given to her prior to gallbladder surgery last year and after surgery she stopped. Told pt okay I will send message to Dr. Yong Channel to see if okay to fill and I will get back to you. Pt verbalized understanding.

## 2021-06-07 NOTE — Telephone Encounter (Signed)
Spoke to pt told her Rx was sent to pharmacy. If symptoms fail to improve within a month or 2 needs to return to [place] and certainly if symptoms worsen. Pt verbalized understanding.

## 2021-06-07 NOTE — Telephone Encounter (Signed)
Dr. Yong Channel, please see message and advise if okay to fill Pantoprazole 40 mg?

## 2021-06-07 NOTE — Telephone Encounter (Signed)
If symptoms fail to improve within a month or 2 he needs to return to [place] and certainly if symptoms worsen

## 2021-06-07 NOTE — Telephone Encounter (Signed)
Yes you may refill

## 2021-06-07 NOTE — Telephone Encounter (Signed)
Rx refilled.

## 2021-07-17 DIAGNOSIS — L570 Actinic keratosis: Secondary | ICD-10-CM | POA: Diagnosis not present

## 2021-07-17 DIAGNOSIS — L820 Inflamed seborrheic keratosis: Secondary | ICD-10-CM | POA: Diagnosis not present

## 2021-07-17 DIAGNOSIS — X32XXXD Exposure to sunlight, subsequent encounter: Secondary | ICD-10-CM | POA: Diagnosis not present

## 2021-07-22 ENCOUNTER — Telehealth: Payer: Self-pay | Admitting: Pharmacist

## 2021-07-22 NOTE — Chronic Care Management (AMB) (Addendum)
Chronic Care Management Pharmacy Assistant   Name: Briana Smith  MRN: [id number] DOB: [birth year]   Reason for Encounter: Hypertension and Diabetes Adherence Call    Recent office visits:  None  Recent consult visits:  None  Hospital visits:  None in previous 6 months  Medications: Outpatient Encounter Medications as of 07/22/2021  Medication Sig   Calcium Carbonate-Vit D-Min (CALCIUM 1200 PO) Take by mouth.   cholecalciferol (VITAMIN D) 1000 units tablet Take 2,000 Units by mouth daily.   CYANOCOBALAMIN PO Take 1 tablet by mouth daily.    diclofenac sodium (VOLTAREN) 1 % GEL Apply 4 g topically 4 (four) times daily. To affected joint.   Multiple Vitamin (MULTIVITAMIN) tablet Take 1 tablet by mouth daily.   pantoprazole (PROTONIX) 40 MG tablet Take 1 tablet (40 mg total) by mouth daily.   polyethylene glycol (MIRALAX / GLYCOLAX) 17 g packet Take 17 g by mouth daily.   pyridoxine (B-6) 100 MG tablet Take 100 mg by mouth daily.   rosuvastatin (CRESTOR) 10 MG tablet Take 1 tablet (10 mg total) by mouth once a week.   No facility-administered encounter medications on file as of 07/22/2021.   Reviewed chart prior to disease state call. Spoke with patient regarding BP  Recent Office Vitals: BP Readings from Last 3 Encounters:  02/13/21 134/88  09/03/20 140/84  08/15/20 128/82   Pulse Readings from Last 3 Encounters:  02/13/21 65  09/03/20 65  08/15/20 66    Wt Readings from Last 3 Encounters:  02/13/21 180 lb 12.8 oz (82 kg)  09/03/20 176 lb (79.8 kg)  08/15/20 169 lb 6.4 oz (76.8 kg)     Kidney Function Lab Results  Component Value Date/Time   CREATININE 0.94 02/13/2021 08:48 AM   CREATININE 0.86 08/15/2020 09:34 AM   CREATININE 0.78 08/02/2020 01:23 AM   CREATININE 0.88 07/13/2020 09:04 AM   CREATININE 0.9 01/31/2016 10:02 AM   CREATININE 0.9 07/18/2015 10:27 AM   GFR 56.61 (L) 02/13/2021 08:48 AM   GFRNONAA 63 08/15/2020 09:34 AM   GFRAA 73  08/15/2020 09:34 AM    BMP Latest Ref Rng & Units 02/13/2021 08/15/2020 08/02/2020  Glucose 70 - 99 mg/dL 105(H) 110(H) 189(H)  BUN 6 - 23 mg/dL 20 21 13   Creatinine 0.40 - 1.20 mg/dL 0.94 0.86 0.78  BUN/Creat Ratio 6 - 22 (calc) - NOT APPLICABLE -  Sodium 932 - 145 mEq/L 141 142 139  Potassium 3.5 - 5.1 mEq/L 4.2 5.2 3.9  Chloride 96 - 112 mEq/L 104 104 106  CO2 19 - 32 mEq/L 32 30 23  Calcium 8.4 - 10.5 mg/dL 9.3 9.7 8.1(L)    Current antihypertensive regimen:  None  How often are you checking your Blood Pressure? infrequently  Current home BP readings: Patient states she has not checked her blood pressure in a couple of weeks. She states when she was checking her blood pressures they were always within normal limits.  What recent interventions/DTPs have been made by any provider to improve Blood Pressure control since last CPP Visit: No recent interventions or DTPs.  Any recent hospitalizations or ED visits since last visit with CPP? No  What diet changes have been made to improve Blood Pressure Control?  Patient states she eats healthy meals.  What exercise is being done to improve your Blood Pressure Control?  Patient states she likes to walk when she can.  Adherence Review: Is the patient currently on ACE/ARB medication? No Does the patient  have >5 day gap between last estimated fill dates? No   Recent Relevant Labs: Lab Results  Component Value Date/Time   HGBA1C 6.2 02/13/2021 08:48 AM   HGBA1C 6.2 (H) 07/30/2020 06:54 AM    Kidney Function Lab Results  Component Value Date/Time   CREATININE 0.94 02/13/2021 08:48 AM   CREATININE 0.86 08/15/2020 09:34 AM   CREATININE 0.78 08/02/2020 01:23 AM   CREATININE 0.88 07/13/2020 09:04 AM   CREATININE 0.9 01/31/2016 10:02 AM   CREATININE 0.9 07/18/2015 10:27 AM   GFR 56.61 (L) 02/13/2021 08:48 AM   GFRNONAA 63 08/15/2020 09:34 AM   GFRAA 73 08/15/2020 09:34 AM    Current antihyperglycemic regimen:  None  What  recent interventions/DTPs have been made to improve glycemic control:  No recent interventions or DTPs.  Have there been any recent hospitalizations or ED visits since last visit with CPP? No  Patient denies hypoglycemic symptoms.  Patient denies hyperglycemic symptoms.  How often are you checking your blood sugar? Patient states she has not checked her blood sugar lately. She states when she was checking them they were always in the 80's.  During the week, how often does your blood glucose drop below 70? Never  Are you checking your feet daily/regularly? Yes  Adherence Review: Is the patient currently on a STATIN medication? Yes Is the patient currently on ACE/ARB medication? No Does the patient have >5 day gap between last estimated fill dates? No   **Patient states she has a question about Pantoprazole. She is concerned about the potential side effects and wants to stop taking it after she is finished with her current prescription. She wants to know if she can take Prilosec instead or if there is something else that can be recommended for her to take in place of it.**  Care Gaps: Medicare Annual Wellness: Due Now Hemoglobin A1C: 6.2% on 07/30/2020 Colonoscopy: Completed 08/22/2008 Dexa Scan: Completed Mammogram: Completed 11/09/2020  Future Appointments  Date Time Provider [place]  11/18/2021  1:00 PM [facility] CCM PHARMACIST [facility]  02/14/2022  8:20 AM [name], MD [facility]    Star Rating Drugs: Rosuvastatin 10 mg last filed 07/04/2021 90 DS  April Daiva Huge, [place] Pharmacist Assistant [phone]   7 minutes spent in review, coordination, and documentation.  Is she having any symptoms of GERD?  Prilosec would have similar side effects of Protonix since they are both in the same medication class.  If she is not having symptoms I can discuss with Dr. Yong Channel tapering it down and using something like Pepcid as needed.  Just let me  know!  Reviewed by: Beverly Milch, PharmD Clinical Pharmacist [phone]

## 2021-07-25 NOTE — Telephone Encounter (Signed)
Patient is wanting to stop her Protonix per last phone note with April.  Are you ok with her doing a step down approach and treating intermittent and future symptoms with something like Pepcid?

## 2021-07-25 NOTE — Telephone Encounter (Signed)
If she is currently on 40 mg of Protonix, I would like for her to be on 20 mg before trying off  If she is currently on 20 mg, I would like for her to try Pepcid twice daily for 2 weeks before stopping and using Pepcid intermittently  Thanks, Garret Reddish

## 2021-07-26 MED ORDER — PANTOPRAZOLE SODIUM 20 MG PO TBEC: 20.0000 mg | DELAYED_RELEASE_TABLET | Freq: Every day | ORAL | 5 refills | Status: DC

## 2021-07-26 NOTE — Addendum Note (Signed)
Addended by: Nilda Riggs on: 07/26/2021 04:03 PM   Modules accepted: Orders

## 2021-07-26 NOTE — Telephone Encounter (Signed)
See Dr. Ansel Bong response and plan moving forward if that is what she wants to do.

## 2021-07-26 NOTE — Telephone Encounter (Signed)
Team you can send in Protonix 20 mg #30 with 5 refills

## 2021-07-26 NOTE — Telephone Encounter (Signed)
Rx sent 

## 2021-08-06 DIAGNOSIS — T161XXA Foreign body in right ear, initial encounter: Secondary | ICD-10-CM | POA: Diagnosis not present

## 2021-08-07 ENCOUNTER — Ambulatory Visit (INDEPENDENT_AMBULATORY_CARE_PROVIDER_SITE_OTHER): Payer: Medicare HMO | Admitting: Family Medicine

## 2021-08-07 ENCOUNTER — Encounter: Payer: Self-pay | Admitting: Family Medicine

## 2021-08-07 ENCOUNTER — Other Ambulatory Visit: Payer: Self-pay

## 2021-08-07 VITALS — BP 147/79 | HR 65 | Temp 97.5°F | Ht 60.0 in | Wt 175.0 lb

## 2021-08-07 DIAGNOSIS — I10 Essential (primary) hypertension: Secondary | ICD-10-CM | POA: Diagnosis not present

## 2021-08-07 DIAGNOSIS — T162XXA Foreign body in left ear, initial encounter: Secondary | ICD-10-CM | POA: Diagnosis not present

## 2021-08-07 NOTE — Progress Notes (Signed)
   Briana Smith is a 80 y.o. female who presents today for an office visit.  Assessment/Plan:  New/Acute Problems: Foreign Body in Left EAC Successfully removed retained hearing aid tip today.  See below procedure note.  She tolerated well.  Chronic Problems Addressed Today: Essential hypertension At goal today per JNC 8 without medications.  Discussed home blood pressure monitoring goal 150/90 or lower.     Subjective:  HPI:  CC of the pt is piece of hearing aid stuck in her left ear. This occurred around a week ago. She had gone to her hearing aid provider to attempt removal, but they refused to do so. She states that she was anxious about this, which she thinks may account for her higher blood pressure the past several days.  BP Readings from Last 3 Encounters:  08/07/21 (!) 147/79  02/13/21 134/88  09/03/20 140/84         Objective:  Physical Exam: BP (!) 147/79   Pulse 65   Temp (!) 97.5 F (36.4 C) (Temporal)   Ht 5' (1.524 m)   Wt 175 lb (79.4 kg)   SpO2 99%   BMI 34.18 kg/m   Gen: No acute distress, resting comfortably HEENT: Foreign body in left EAC CV: Regular rate and rhythm with no murmurs appreciated Pulm: Normal work of breathing, clear to auscultation bilaterally with no crackles, wheezes, or rhonchi Neuro: Grossly normal, moves all extremities Psych: Normal affect and thought content  Procedure Note: Verbal consent was obtained.  Anesthesia was not used.  The foreign body was visualized through direct visualization using an otoscope.  Alligator forceps were slowly advanced into the EAC.  Foreign body was then grasped using the alligator forceps and removed without difficulty.  No blood loss.  No complications.  She tolerated procedure well.      I,Jordan Kelly,acting as a Education administrator for Dimas Chyle, MD.,have documented all relevant documentation on the behalf of Dimas Chyle, MD,as directed by  Dimas Chyle, MD while in the presence of Dimas Chyle,  MD.  I, Dimas Chyle, MD, have reviewed all documentation for this visit. The documentation on 08/07/21 for the exam, diagnosis, procedures, and orders are all accurate and complete.  [name]. Jerline Pain, MD 08/07/2021 8:45 AM

## 2021-08-07 NOTE — Patient Instructions (Signed)
It was very nice to see you today!  We removed a foreign body in your left ear.  Keep an eye on your blood pressure and let us know if it is persistently 150/90 or higher.  Take care, Dr Jerline Pain  PLEASE NOTE:  If you had any lab tests please let us know if you have not heard back within a few days. You may see your results on mychart before we have a chance to review them but we will give you a call once they are reviewed by [place]. If we ordered any referrals today, please let us know if you have not heard from their office within the next week.   Please try these tips to maintain a healthy lifestyle:  Eat at least 3 REAL meals and 1-2 snacks per day.  Aim for no more than 5 hours between eating.  If you eat breakfast, please do so within one hour of getting up.   Each meal should contain half fruits/vegetables, one quarter protein, and one quarter carbs (no bigger than a computer mouse)  Cut down on sweet beverages. This includes juice, soda, and sweet tea.   Drink at least 1 glass of water with each meal and aim for at least 8 glasses per day  Exercise at least 150 minutes every week.

## 2021-10-03 ENCOUNTER — Other Ambulatory Visit: Payer: Self-pay | Admitting: Family Medicine

## 2021-10-03 DIAGNOSIS — Z1231 Encounter for screening mammogram for malignant neoplasm of breast: Secondary | ICD-10-CM

## 2021-11-03 ENCOUNTER — Other Ambulatory Visit: Payer: Self-pay | Admitting: Family Medicine

## 2021-11-08 NOTE — Progress Notes (Signed)
Chronic Care Management Pharmacy Note  11/18/2021 Name:  Briana Smith MRN:  [id number] DOB:  April 16, 1941  Subjective: Briana Smith is an 81 y.o. year old female who is a primary patient of [name], [name], MD.  The CCM team was consulted for assistance with disease management and care coordination needs.   Engaged with patient by telephone for follow up visit in response to provider referral for pharmacy case management and/or care coordination services.   Consent to Services:  The patient was given information about Chronic Care Management services, agreed to services, and gave verbal consent prior to initiation of services.  Please see initial visit note for detailed documentation.  Patient Care Team: [name], MD as PCP - General (Family Medicine) [name], MD as Consulting Physician (Dermatology) Edythe Clarity, [facility] (Pharmacist)  Recent office visits: 08/15/2020 (PCP): DEXA- osteopenia, major hip >3%. Decided on repeat scan in two years then consider fosamax. Lipids elevated, consider low dose once weekly rosuvastatin 10 mg 07/13/2020 (Dr. Jerline Pain): epigastric abdominal pian  Objective: Lab Results  Component Value Date   CREATININE 0.94 02/13/2021   BUN 20 02/13/2021   GFR 56.61 (L) 02/13/2021   GFRNONAA 63 08/15/2020   GFRAA 73 08/15/2020   NA 141 02/13/2021   K 4.2 02/13/2021   CALCIUM 9.3 02/13/2021   CO2 32 02/13/2021   Lab Results  Component Value Date/Time   HGBA1C 6.2 02/13/2021 08:48 AM   HGBA1C 6.2 (H) 07/30/2020 06:54 AM   GFR 56.61 (L) 02/13/2021 08:48 AM   GFR 63.30 01/25/2020 08:59 AM    Last diabetic Eye exam: No results found for: HMDIABEYEEXA  Last diabetic Foot exam: No results found for: HMDIABFOOTEX  Lab Results  Component Value Date   CHOL 222 (H) 02/13/2021   HDL 61.90 02/13/2021   LDLCALC 141 (H) 02/13/2021   LDLDIRECT 141.7 11/08/2013   TRIG 97.0 02/13/2021   CHOLHDL 4 02/13/2021   Hepatic Function Latest Ref  Rng & Units 02/13/2021 09/03/2020 08/15/2020  Total Protein 6.0 - 8.3 g/dL 7.1 7.4 7.0  Albumin 3.5 - 5.2 g/dL 4.2 4.2 -  AST 0 - 37 U/L _0 ALT 0 - 35 U/L 19 23 49(H)  Alk Phosphatase 39 - 117 U/L 51 62 -  Total Bilirubin 0.2 - 1.2 mg/dL 0.6 0.5 0.8  Bilirubin, Direct 0.0 - 0.3 mg/dL - 0.1 -   Lab Results  Component Value Date/Time   TSH 3.60 07/28/2019 09:27 AM   TSH 1.104 03/28/2017 12:14 PM   TSH 2.99 01/21/2016 08:45 AM   FREET4 0.8 01/19/2009 03:05 PM   CBC Latest Ref Rng & Units 02/13/2021 08/15/2020 07/30/2020  WBC 4.0 - 10.5 K/uL 5.3 5.3 10.4  Hemoglobin 12.0 - 15.0 g/dL 13.5 13.1 13.0  Hematocrit 36.0 - 46.0 % 40.8 40.7 40.3  Platelets 150.0 - 400.0 K/uL 147.0(L) 326 161   No results found for: VD25OH  08/15/2020 (PCP): DEXA- osteopenia, major hip >3%. Decided on repeat scan in two years then consider fosamax  Clinical ASCVD: No  The ASCVD Risk score (Arnett DK, et al., 2019) failed to calculate for the following reasons:   The 2019 ASCVD risk score is only valid for ages 31 to 8    Depression screen PHQ 2/9 08/07/2021 02/13/2021 01/25/2020  Decreased Interest 0 0 0  Down, Depressed, Hopeless 0 0 0  PHQ - 2 Score 0 0 0  Altered sleeping - 0 -  Tired, decreased energy -  0 -  Change in appetite - 0 -  Feeling bad or failure about yourself  - 0 -  Trouble concentrating - 0 -  Moving slowly or fidgety/restless - 0 -  Suicidal thoughts - 0 -  PHQ-9 Score - 0 -  Difficult doing work/chores - Not difficult at all -    Social History   Tobacco Use  Smoking Status Never  Smokeless Tobacco Never   BP Readings from Last 3 Encounters:  08/07/21 (!) 147/79  02/13/21 134/88  09/03/20 140/84   Pulse Readings from Last 3 Encounters:  08/07/21 65  02/13/21 65  09/03/20 65   Wt Readings from Last 3 Encounters:  08/07/21 175 lb (79.4 kg)  02/13/21 180 lb 12.8 oz (82 kg)  09/03/20 176 lb (79.8 kg)   Assessment/Interventions: Review of patient past medical  history, allergies, medications, health status, including review of consultants reports, laboratory and other test data, was performed as part of comprehensive evaluation and provision of chronic care management services.   SDOH:  (Social Determinants of Health) assessments and interventions performed: Yes  CCM Care Plan No Known Allergies Medications Reviewed Today     Reviewed by Edythe Clarity, [facility] (Pharmacist) on 11/18/21 at 1328  Med List Status: <None>   Medication Order Taking? Sig Documenting Provider Last Dose Status Informant  Calcium Carbonate-Vit D-Min (CALCIUM 1200 PO) [id number] Yes Take by mouth. [provider] Taking Active   cholecalciferol (VITAMIN D) 1000 units tablet [id number] Yes Take 2,000 Units by mouth daily. [provider] Taking Active Self  CYANOCOBALAMIN PO [id number] Yes Take 1 tablet by mouth daily.  [provider] Taking Active Self  diclofenac sodium (VOLTAREN) 1 % GEL [id number] Yes Apply 4 g topically 4 (four) times daily. To affected joint. Gregor Hams, MD Taking Active   Multiple Vitamin (MULTIVITAMIN) tablet [id number] Yes Take 1 tablet by mouth daily. [provider] Taking Active Self  pantoprazole (PROTONIX) 20 MG tablet [id number] Yes Take 1 tablet (20 mg total) by mouth daily. [name], MD Taking Active   polyethylene glycol (MIRALAX / GLYCOLAX) 17 g packet [id number] Yes Take 17 g by mouth daily. [provider] Taking Active   pyridoxine (B-6) 100 MG tablet [id number] Yes Take 100 mg by mouth daily. [provider] Taking Active   rosuvastatin (CRESTOR) 10 MG tablet [id number] Yes Take 1 tablet by mouth once a week [name], MD Taking Active            Patient Active Problem List   Diagnosis Date Noted   Choledocholithiasis with obstruction 09/03/2020   Abnormal cholangiogram    Acute cholecystitis 07/29/2020   Senile purpura ([place]) 01/25/2020   History of septic arthritis  07/26/2018   Cellulitis of left breast 03/28/2017   Hyperlipidemia 02/02/2017   Hyperglycemia 03/19/2015   Osteoporosis 01/20/2014   History of breast cancer 06/30/2013   Essential hypertension 08/03/2008   SKIN CANCER, HX OF 08/03/2008   Immunization History  Administered Date(s) Administered   Fluad Quad(high Dose 65+) 07/28/2019, 07/13/2020   Influenza Split 07/10/2011, 07/29/2012, 08/04/2013   Influenza Whole 07/18/2009   Influenza, High Dose Seasonal PF 08/15/2015, 07/02/2017, 07/26/2018   Influenza-Unspecified 07/31/2014, 07/31/2021   PFIZER(Purple Top)SARS-COV-2 Vaccination 10/27/2019, 11/17/2019, 05/26/2020, 04/22/2021   Pneumococcal Conjugate-13 11/15/2013   Pneumococcal Polysaccharide-23 11/18/2005   Td 08/02/2002   Tdap 10/20/2012   Zoster Recombinat (Shingrix) 07/26/2018, 09/30/2018   Zoster, Live 02/21/2009   Conditions to be addressed/monitored:  Hypertension, Hyperlipidemia  and Osteopenia Care Plan : [place]  Updates made by Edythe Clarity, RPH since 11/18/2021 12:00 AM     Problem: Hypertension, Hyperlipidemia and Osteopenia      Long-Range Goal: Disease state management   Start Date: 11/26/2020  Expected End Date: 11/26/2021  Recent Progress: On track  Priority: High  Note:   Current Barriers:  Elevated LDL cholesterol  Pharmacist Clinical Goal(s):  Over the next 365 days, patient will verbalize ability to afford treatment regimen contact provider office for questions/concerns as evidenced notation of same in electronic health record through collaboration with PharmD and provider.   Interventions: 1:1 collaboration with [name], MD regarding development and update of comprehensive plan of care as evidenced by provider attestation and co-signature Inter-disciplinary care team collaboration (see longitudinal plan of care) Comprehensive medication review performed; medication list updated in electronic medical  record Hypertension (BP goal <140/90) -controlled -Current treatment: n/a -Medications previously tried: lisinopril  -Current home readings: checking over couple of weeks- 136/66 last week. -Current dietary habits: weight watchers meal plan but has held off - stress and stress eating has been ongoing problem, husband has experienced cognitive decline past two years and has DM/other conditions. She is concerned that she might bring home COVID-19 to him so this prevents her from getting out more. Declines additional support at this time. -Current exercise habits: yardwork, cubii exercise machine, has additional home gym equipment.  -Denies hypotensive/hypertensive symptoms -Educated on BP goals and benefits of medications for prevention of heart attack, stroke and kidney damage; Daily salt intake goal < 2300 mg; Exercise goal of 150 minutes per week; -Counseled to monitor BP at home every other week, document, and provide log at future appointments -Counseled on diet and exercise extensively  CPA BP check in 2 months.   Update 05/13/21 124/62, 124/70, 127/67, 135/63 - home readings recently She has not had any dizziness.headaches Still doing her cubii exercise machine Continue current management    Hyperlipidemia: (LDL goal < 70) -uncontrolled -Current treatment: Rosuvastatin 10 mg once weekly- restarted 08/2020 Appropriate, Query effective,,  -Medications previously tried: rosuvastatin 10 mg once/twice weekly  -Current exercise habits: see htn -Educated on Cholesterol goals;  Benefits of statin for ASCVD risk reduction; Importance of limiting foods high in cholesterol; Exercise goal of 150 minutes per week; -Counseled on diet and exercise extensively Recommended to continue current medication  Update 05/13/21 LDL elevated in may - has been taking the Crestor weekly since Still tolerating! Recommend recheck lipids at some point before next physical. If LDL still elevated,  could increase the frequency of which she is taking - titrating up slowly to promote tolerance.  Update 11/18/21 Continues to tolerate once weekly dose of statin. Has upcoming physical in May Will recheck lipids - patient inquires about taking twice weekly because her pharmacist told her there was no point in the once weekly dose. Encouraged her to continue as is for now - recheck lipids and we can adjust if needed. FU after OV in May.  Hyperglycemia(A1c goal <5.7) -Not ideally controlled -Current medications: None -Current home glucose readings fasting glucose:  84, 88, 94, 89 -Denies hypoglycemic/hyperglycemic symptoms -Current exercise: cubii exercise machine -Educated on A1c and blood sugar goals; Exercise goal of 150 minutes per week; Benefits of routine self-monitoring of blood sugar; -Counseled to check feet daily and get yearly eye exams -She has cut back on sugars and carbs and really noticing a difference in her sugar and overall health.  Feels  much better!! -Recommended continue current lifestyle mods! -Recheck A1c next OV!  Update 11/18/21 136 Fasting sugar this morning - she had sweet potato with brown sugar/splenda sweetener on top. Explained the effect carbohydrates can have on blood sugars. She plans to monitor more often to see how she reacts to certain foods. Denies other increased fasting glucose readings. Still controlled on lifestyle. Recheck A1c in May, no other changes needed at this time.  Patient Goals/Self-Care Activities Over the next 365 days, patient will:  - take medications as prescribed check blood pressure every couple of weeks, document, and provide at future appointment         Medication Assistance: None required.  Patient affirms current coverage meets needs. Patient's preferred pharmacy is:  [facility] [address] [place] - [address] [place] [place] [address] Phone: [phone] Fax:  [phone]  Patient decided to: Continue current medication management strategy Patient agrees to Care Plan and Follow-up.  Current Barriers:  Need to optimize diet/exercise  Pharmacist Clinical Goal(s):  Over the next 365 days, patient will verbalize ability to afford treatment regimen contact provider office for questions/concerns as evidenced notation of same in electronic health record through collaboration with PharmD and provider.   Interventions: 1:1 collaboration with [name], MD regarding development and update of comprehensive plan of care as evidenced by provider attestation and co-signature Inter-disciplinary care team collaboration (see longitudinal plan of care) Comprehensive medication review performed; medication list updated in electronic medical record  Patient Goals/Self-Care Activities Over the next 365 days, patient will:  - take medications as prescribed check blood pressure every couple of weeks, document, and provide at future appointments  Future Appointments  Date Time Provider [place]  11/19/2021 11:00 AM [place]  02/14/2022  8:20 AM [name], MD [facility]   Follow-up plan with Care Management Team: CPA: 3 month to get updated labs after physical RPH: 6 month telephone visit  Beverly Milch, PharmD Clinical Pharmacist  [facility] [phone]

## 2021-11-11 ENCOUNTER — Ambulatory Visit
Admission: RE | Admit: 2021-11-11 | Discharge: 2021-11-11 | Disposition: A | Discharge status: Home / Self Care | Payer: Medicare HMO | Source: Ambulatory Visit | Attending: Family Medicine | Admitting: Family Medicine

## 2021-11-11 DIAGNOSIS — Z1231 Encounter for screening mammogram for malignant neoplasm of breast: Secondary | ICD-10-CM | POA: Diagnosis not present

## 2021-11-18 ENCOUNTER — Ambulatory Visit (INDEPENDENT_AMBULATORY_CARE_PROVIDER_SITE_OTHER): Payer: Medicare HMO | Admitting: Pharmacist

## 2021-11-18 DIAGNOSIS — E785 Hyperlipidemia, unspecified: Secondary | ICD-10-CM

## 2021-11-18 DIAGNOSIS — R739 Hyperglycemia, unspecified: Secondary | ICD-10-CM

## 2021-11-18 NOTE — Patient Instructions (Addendum)
Visit Information   Goals Addressed             This Visit's Progress    Track and Manage My Blood Pressure-Hypertension   On track    Timeframe:  Long-Range Goal Priority:  High Start Date:  05/14/21                           Expected End Date:   11/14/21                    Follow Up Date 09/04/21    - check blood pressure weekly - choose a place to take my blood pressure (home, clinic or office, retail store) - write blood pressure results in a log or diary    Why is this important?   You won't feel high blood pressure, but it can still hurt your blood vessels.  High blood pressure can cause heart or kidney problems. It can also cause a stroke.  Making lifestyle changes like losing a little weight or eating less salt will help.  Checking your blood pressure at home and at different times of the day can help to control blood pressure.  If the doctor prescribes medicine remember to take it the way the doctor ordered.  Call the office if you cannot afford the medicine or if there are questions about it.     Notes:        Patient Care Plan: CCM Pharmacy Care Plan     Problem Identified: Hypertension, Hyperlipidemia and Osteopenia      Long-Range Goal: Disease state management   Start Date: 11/26/2020  Expected End Date: 11/26/2021  Recent Progress: On track  Priority: High  Note:   Current Barriers:  Elevated LDL cholesterol  Pharmacist Clinical Goal(s):  Over the next 365 days, patient will verbalize ability to afford treatment regimen contact provider office for questions/concerns as evidenced notation of same in electronic health record through collaboration with PharmD and provider.   Interventions: 1:1 collaboration with [name], MD regarding development and update of comprehensive plan of care as evidenced by provider attestation and co-signature Inter-disciplinary care team collaboration (see longitudinal plan of care) Comprehensive medication  review performed; medication list updated in electronic medical record Hypertension (BP goal <140/90) -controlled -Current treatment: n/a -Medications previously tried: lisinopril  -Current home readings: checking over couple of weeks- 136/66 last week. -Current dietary habits: weight watchers meal plan but has held off - stress and stress eating has been ongoing problem, husband has experienced cognitive decline past two years and has DM/other conditions. She is concerned that she might bring home COVID-19 to him so this prevents her from getting out more. Declines additional support at this time. -Current exercise habits: yardwork, cubii exercise machine, has additional home gym equipment.  -Denies hypotensive/hypertensive symptoms -Educated on BP goals and benefits of medications for prevention of heart attack, stroke and kidney damage; Daily salt intake goal < 2300 mg; Exercise goal of 150 minutes per week; -Counseled to monitor BP at home every other week, document, and provide log at future appointments -Counseled on diet and exercise extensively  CPA BP check in 2 months.   Update 05/13/21 124/62, 124/70, 127/67, 135/63 - home readings recently She has not had any dizziness.headaches Still doing her cubii exercise machine Continue current management    Hyperlipidemia: (LDL goal < 70) -uncontrolled -Current treatment: Rosuvastatin 10 mg once weekly- restarted 08/2020 Appropriate, Query effective,,  -  Medications previously tried: rosuvastatin 10 mg once/twice weekly  -Current exercise habits: see htn -Educated on Cholesterol goals;  Benefits of statin for ASCVD risk reduction; Importance of limiting foods high in cholesterol; Exercise goal of 150 minutes per week; -Counseled on diet and exercise extensively Recommended to continue current medication  Update 05/13/21 LDL elevated in may - has been taking the Crestor weekly since Still tolerating! Recommend recheck lipids  at some point before next physical. If LDL still elevated, could increase the frequency of which she is taking - titrating up slowly to promote tolerance.  Update 11/18/21 Continues to tolerate once weekly dose of statin. Has upcoming physical in May Will recheck lipids - patient inquires about taking twice weekly because her pharmacist told her there was no point in the once weekly dose. Encouraged her to continue as is for now - recheck lipids and we can adjust if needed. FU after OV in May.  Hyperglycemia(A1c goal <5.7) -Not ideally controlled -Current medications: None -Current home glucose readings fasting glucose:  84, 88, 94, 89 -Denies hypoglycemic/hyperglycemic symptoms -Current exercise: cubii exercise machine -Educated on A1c and blood sugar goals; Exercise goal of 150 minutes per week; Benefits of routine self-monitoring of blood sugar; -Counseled to check feet daily and get yearly eye exams -She has cut back on sugars and carbs and really noticing a difference in her sugar and overall health.  Feels much better!! -Recommended continue current lifestyle mods! -Recheck A1c next OV!  Update 11/18/21 136 Fasting sugar this morning - she had sweet potato with brown sugar/splenda sweetener on top. Explained the effect carbohydrates can have on blood sugars. She plans to monitor more often to see how she reacts to certain foods. Denies other increased fasting glucose readings. Still controlled on lifestyle. Recheck A1c in May, no other changes needed at this time.  Patient Goals/Self-Care Activities Over the next 365 days, patient will:  - take medications as prescribed check blood pressure every couple of weeks, document, and provide at future appointment         Patient verbalizes understanding of instructions and care plan provided today and agrees to view in [place]. Active MyChart status confirmed with patient.   Telephone follow up appointment with pharmacy  team member scheduled for: 6 months  Edythe Clarity, [place], PharmD Clinical Pharmacist  [facility] [phone]

## 2021-11-19 ENCOUNTER — Ambulatory Visit (INDEPENDENT_AMBULATORY_CARE_PROVIDER_SITE_OTHER): Payer: Medicare HMO

## 2021-11-19 ENCOUNTER — Other Ambulatory Visit: Payer: Self-pay

## 2021-11-19 ENCOUNTER — Telehealth: Payer: Medicare HMO

## 2021-11-19 DIAGNOSIS — Z0000 Encounter for general adult medical examination without abnormal findings: Secondary | ICD-10-CM

## 2021-11-19 NOTE — Progress Notes (Signed)
Virtual Visit via Telephone Note  I connected with  Briana Smith on 11/19/21 at 11:00 AM EST by telephone and verified that I am speaking with the correct person using two identifiers.  Medicare Annual Wellness visit completed telephonically due to Covid-19 pandemic.   Persons participating in this call: This Health Coach and this patient.   Location: Patient: home Provider: office   I discussed the limitations, risks, security and privacy concerns of performing an evaluation and management service by telephone and the availability of in person appointments. The patient expressed understanding and agreed to proceed.  Unable to perform video visit due to video visit attempted and failed and/or patient does not have video capability.   Some vital signs may be absent or patient reported.   Willette Brace, LPN   Subjective:   Briana Smith is a 81 y.o. female who presents for Medicare Annual (Subsequent) preventive examination.  Review of Systems     Cardiac Risk Factors include: advanced age (>74men, >88 women);dyslipidemia;hypertension;obesity (BMI >30kg/m2)     Objective:    There were no vitals filed for this visit. There is no height or weight on file to calculate BMI.  Advanced Directives 11/19/2021 08/01/2020 05/10/2019 03/28/2017 01/31/2016 03/19/2015 01/18/2015  Does Patient Have a Medical Advance Directive? Yes Yes Yes Yes No Yes No  Type of Academic librarian Living will [place];Living will [place];Living will - [place];Living will -  Does patient want to make changes to medical advance directive? - - No - Patient declined No - Patient declined - - -  Copy of [place] in Chart? No - copy requested - No - copy requested No - copy requested - - -    Current Medications (verified) Outpatient Encounter Medications as of 11/19/2021  Medication Sig   BIOTIN  PO Take by mouth.   Calcium Carbonate-Vit D-Min (CALCIUM 1200 PO) Take by mouth.   cholecalciferol (VITAMIN D) 1000 units tablet Take 2,000 Units by mouth daily.   CYANOCOBALAMIN PO Take 1 tablet by mouth daily.    Multiple Vitamin (MULTIVITAMIN) tablet Take 1 tablet by mouth daily.   polyethylene glycol (MIRALAX / GLYCOLAX) 17 g packet Take 17 g by mouth daily.   pyridoxine (B-6) 100 MG tablet Take 100 mg by mouth daily.   rosuvastatin (CRESTOR) 10 MG tablet Take 1 tablet by mouth once a week   FLUZONE HIGH-DOSE QUADRIVALENT 0.7 ML SUSY    PFIZER COVID-19 VAC BIVALENT injection    [DISCONTINUED] diclofenac sodium (VOLTAREN) 1 % GEL Apply 4 g topically 4 (four) times daily. To affected joint.   [DISCONTINUED] pantoprazole (PROTONIX) 20 MG tablet Take 1 tablet (20 mg total) by mouth daily.   No facility-administered encounter medications on file as of 11/19/2021.    Allergies (verified) Patient has no known allergies.   History: Past Medical History:  Diagnosis Date   Breast cancer ([place])    Cancer ([place])    breast cancer- left   Heart murmur    years ago. no recent issues   Hyperlipidemia    Hypertension    per pt treated in past   Osteoporosis 01/20/2014   (5) DEXA scan 08/31/2013 shows osteopenia with a T score of -2.0; zolendronate started April 2015, to be repeated every 6 months while on anastrozole.  2016 dexa stable. Repeat 2019   Personal history of chemotherapy    Personal history of radiation therapy  Past Surgical History:  Procedure Laterality Date   ABDOMINAL HYSTERECTOMY  1987   fibroids   BREAST BIOPSY     BREAST LUMPECTOMY  10/23/10   left   CHOLECYSTECTOMY N/A 07/30/2020   Procedure: LAPAROSCOPIC CHOLECYSTECTOMY WITH INTRAOPERATIVE CHOLANGIOGRAM AND ICG DYE;  Surgeon: Donnie Mesa, MD;  Location: [place];  Service: General;  Laterality: N/A;   ENDOSCOPIC RETROGRADE CHOLANGIOPANCREATOGRAPHY (ERCP) WITH PROPOFOL N/A 08/01/2020   Procedure: ENDOSCOPIC  RETROGRADE CHOLANGIOPANCREATOGRAPHY (ERCP) WITH PROPOFOL;  Surgeon: Milus Banister, MD;  Location: [facility] ENDOSCOPY;  Service: Endoscopy;  Laterality: N/A;   PANCREATIC STENT PLACEMENT  08/01/2020   Procedure: PANCREATIC STENT PLACEMENT;  Surgeon: Milus Banister, MD;  Location: [facility] ENDOSCOPY;  Service: Endoscopy;;   PORT-A-CATH REMOVAL  09/02/2011   Procedure: REMOVAL PORT-A-CATH;  Surgeon: Adin Hector, MD;  Location: [place];  Service: General;  Laterality: Right;   REMOVAL OF STONES  08/01/2020   Procedure: REMOVAL OF STONES;  Surgeon: Milus Banister, MD;  Location: [facility] ENDOSCOPY;  Service: Endoscopy;;   SPHINCTEROTOMY  08/01/2020   Procedure: Joan Mayans;  Surgeon: Milus Banister, MD;  Location: [place];  Service: Endoscopy;;   TOTAL HIP ARTHROPLASTY Left 04/03/2017   Procedure: IRRIGATION AND [place] LEFT HIP;  Surgeon: Rod Can, MD;  Location: [facility];  Service: Orthopedics;  Laterality: Left;   Family History  Problem Relation Age of Onset   COPD Father    Diabetes Mother    Dementia Mother    Kidney disease Mother        not on dialysis   Cancer Brother        jaw   Hypertension Brother    Breast cancer Paternal Aunt    Social History   Socioeconomic History   Marital status: Married    Spouse name: Wynonia Lawman   Number of children: 3   Years of education: Not on file   Highest education level: Not on file  Occupational History   Not on file  Tobacco Use   Smoking status: Never   Smokeless tobacco: Never  Vaping Use   Vaping Use: Never used  Substance and Sexual Activity   Alcohol use: No   Drug use: No   Sexual activity: Not on file  Other Topics Concern   Not on file  Social History Narrative   Married. Lives with husband. 3 children. 3 grandkids (22, 22, 18 in 2018).       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       Hobbies: working puzzles, reading, shoping, beach   Social Determinants of Adult nurse Strain: Low Risk    Difficulty of Paying Living Expenses: Not hard at all  Food Insecurity: No Food Insecurity   Worried About Charity fundraiser in the Last Year: Never true   Arboriculturist in the Last Year: Never true  Transportation Needs: No Transportation Needs   Lack of Transportation (Medical): No   Lack of Transportation (Non-Medical): No  Physical Activity: Sufficiently Active   Days of Exercise per Week: 5 days   Minutes of Exercise per Session: 30 min  Stress: No Stress Concern Present   Feeling of Stress : Not at all  Social Connections: Moderately Integrated   Frequency of Communication with Friends and Family: More than three times a week   Frequency of Social Gatherings with Friends and Family: More than three times a week   Attends Religious Services: More  than 4 times per year   Active Member of Clubs or Organizations: No   Attends Archivist Meetings: Never   Marital Status: Married    Tobacco Counseling Counseling given: Not Answered   Clinical Intake:  Pre-visit preparation completed: Yes  Pain : No/denies pain     BMI - recorded: 34.18 Nutritional Status: BMI > 30  Obese Nutritional Risks: None Diabetes: No  How often do you need to have someone help you when you read instructions, pamphlets, or other written materials from your doctor or pharmacy?: 1 - Never  Diabetic?No  Interpreter Needed?: No  Information entered by :: Charlott Rakes, LPN   Activities of Daily Living In your present state of health, do you have any difficulty performing the following activities: 11/19/2021  Hearing? Y  Comment hering aids  Vision? N  Difficulty concentrating or making decisions? N  Walking or climbing stairs? N  Dressing or bathing? N  Doing errands, shopping? N  Preparing Food and eating ? N  Using the Toilet? N  In the past six months, have you accidently leaked urine? Y  Comment wears pads  Do you have problems  with loss of bowel control? N  Managing your Medications? N  Managing your Finances? N  Housekeeping or managing your Housekeeping? N  Some recent data might be hidden    Patient Care Team: [name], MD as PCP - General (Family Medicine) [name], MD as Consulting Physician (Dermatology) Edythe Clarity, [facility] (Pharmacist)  Indicate any recent Medical Services you may have received from other than [facility] providers in the past year (date may be approximate).     Assessment:   This is a routine wellness examination for [place].  Hearing/Vision screen Hearing Screening - Comments:: Pt wears hearing aids  Vision Screening - Comments:: Pt follows up with [facility] for annual eye exams   Dietary issues and exercise activities discussed: Current Exercise Habits: Home exercise routine, Type of exercise: Other - see comments, Time (Minutes): 30, Frequency (Times/Week): 5, Weekly Exercise (Minutes/Week): 150   Goals Addressed             This Visit's Progress    Patient Stated       Lose weight        Depression Screen PHQ 2/9 Scores 11/19/2021 08/07/2021 02/13/2021 01/25/2020 07/28/2019 07/26/2018 06/16/2017  PHQ - 2 Score 0 0 0 0 0 0 0  PHQ- 9 Score - - 0 - - - -    Fall Risk Fall Risk  11/19/2021 08/07/2021 02/13/2021 08/15/2020 07/28/2019  Falls in the past year? 0 0 0 0 0  Number falls in past yr: 0 0 0 0 0  Injury with Fall? 0 0 0 0 0  Risk for fall due to : Impaired vision No Fall Risks - - -  Follow up Falls prevention discussed - - - -    FALL RISK PREVENTION PERTAINING TO THE HOME:  Any stairs in or around the home? Yes  If so, are there any without handrails? No  Home free of loose throw rugs in walkways, pet beds, electrical cords, etc? Yes  Adequate lighting in your home to reduce risk of falls? Yes   ASSISTIVE DEVICES UTILIZED TO PREVENT FALLS:  Life alert? No  Use of a cane, walker or w/c? No  Grab bars in the bathroom? No  Shower chair or  bench in shower? Yes  Elevated toilet seat or a handicapped toilet? No  TIMED UP AND GO:  Was the test performed? No .  Cognitive Function:     6CIT Screen 11/19/2021  What Year? 0 points  What month? 0 points  What time? 0 points  Count back from 20 0 points  Months in reverse 0 points  Repeat phrase 0 points  Total Score 0    Immunizations Immunization History  Administered Date(s) Administered   Fluad Quad(high Dose 65+) 07/28/2019, 07/13/2020   Influenza Split 07/10/2011, 07/29/2012, 08/04/2013   Influenza Whole 07/18/2009   Influenza, High Dose Seasonal PF 08/15/2015, 07/02/2017, 07/26/2018   Influenza-Unspecified 07/31/2014, 07/31/2021   PFIZER(Purple Top)SARS-COV-2 Vaccination 10/27/2019, 11/17/2019, 05/26/2020, 04/22/2021   Pneumococcal Conjugate-13 11/15/2013   Pneumococcal Polysaccharide-23 11/18/2005   Td 08/02/2002   Tdap 10/20/2012   Zoster Recombinat (Shingrix) 07/26/2018, 09/30/2018   Zoster, Live 02/21/2009    TDAP status: Up to date  Flu Vaccine status: Up to date  Pneumococcal vaccine status: Up to date  Covid-19 vaccine status: Completed vaccines  Qualifies for Shingles Vaccine? Yes   Zostavax completed Yes   Shingrix Completed?: Yes  Screening Tests Health Maintenance  Topic Date Due   COVID-19 Vaccine (5 - Booster for Pfizer series) 06/17/2021   TETANUS/TDAP  10/20/2022   MAMMOGRAM  11/11/2022   Pneumonia Vaccine 58+ Years old  Completed   INFLUENZA VACCINE  Completed   DEXA SCAN  Completed   Zoster Vaccines- Shingrix  Completed   HPV VACCINES  Aged Out    Health Maintenance  Health Maintenance Due  Topic Date Due   COVID-19 Vaccine (5 - Booster for [place] series) 06/17/2021    Colorectal cancer screening: No longer required.   Mammogram status: Completed 11/11/21. Repeat every year  Bone Density status: Completed 08/15/20. Results reflect: Bone density results: OSTEOPOROSIS. Repeat every 2 years.   Additional  Screening:   Vision Screening: Recommended annual ophthalmology exams for early detection of glaucoma and other disorders of the eye. Is the patient up to date with their annual eye exam?  Yes  Who is the provider or what is the name of the office in which the patient attends annual eye exams? [facility] If pt is not established with a provider, would they like to be referred to a provider to establish care? No .   Dental Screening: Recommended annual dental exams for proper oral hygiene  Community Resource Referral / Chronic Care Management: CRR required this visit?  No   CCM required this visit?  No      Plan:     I have personally reviewed and noted the following in the patients chart:   Medical and social history Use of alcohol, tobacco or illicit drugs  Current medications and supplements including opioid prescriptions.  Functional ability and status Nutritional status Physical activity Advanced directives List of other physicians Hospitalizations, surgeries, and ER visits in previous 12 months Vitals Screenings to include cognitive, depression, and falls Referrals and appointments  In addition, I have reviewed and discussed with patient certain preventive protocols, quality metrics, and best practice recommendations. A written personalized care plan for preventive services as well as general preventive health recommendations were provided to patient.     Willette Brace, LPN   [id number]/[id number]   Nurse Notes: None

## 2021-11-19 NOTE — Patient Instructions (Addendum)
Briana Smith , Thank you for taking time to come for your Medicare Wellness Visit. I appreciate your ongoing commitment to your health goals. Please review the following plan we discussed and let me know if I can assist you in the future.   Screening recommendations/referrals: Colonoscopy: no longer required  Mammogram: Done 11/09/20 repeat every year  Bone Density: Done 08/15/20 repeat every 2 years  Recommended yearly ophthalmology/optometry visit for glaucoma screening and checkup Recommended yearly dental visit for hygiene and checkup  Vaccinations: Influenza vaccine: Done 07/31/21 repeat every year Pneumococcal vaccine: Up to date Tdap vaccine: Done 10/20/12 repeat every 10 years  Shingles vaccine: Done 10/21 & 09/30/18   Covid-19:Completed 1/21, 2/11, 05/26/20 & 04/22/21  Advanced directives: Please bring a copy of your health care power of attorney and living will to the office at your convenience.  Conditions/risks identified: lose weight   Next appointment: Follow up in one year for your annual wellness visit    Preventive Care 65 Years and Older, Female Preventive care refers to lifestyle choices and visits with your health care provider that can promote health and wellness. What does preventive care include? A yearly physical exam. This is also called an annual well check. Dental exams once or twice a year. Routine eye exams. Ask your health care provider how often you should have your eyes checked. Personal lifestyle choices, including: Daily care of your teeth and gums. Regular physical activity. Eating a healthy diet. Avoiding tobacco and drug use. Limiting alcohol use. Practicing safe sex. Taking low-dose aspirin every day. Taking vitamin and mineral supplements as recommended by your health care provider. What happens during an annual well check? The services and screenings done by your health care provider during your annual well check will depend on your age,  overall health, lifestyle risk factors, and family history of disease. Counseling  Your health care provider may ask you questions about your: Alcohol use. Tobacco use. Drug use. Emotional well-being. Home and relationship well-being. Sexual activity. Eating habits. History of falls. Memory and ability to understand (cognition). Work and work Statistician. Reproductive health. Screening  You may have the following tests or measurements: Height, weight, and BMI. Blood pressure. Lipid and cholesterol levels. These may be checked every 5 years, or more frequently if you are over 46 years old. Skin check. Lung cancer screening. You may have this screening every year starting at age 71 if you have a 30-pack-year history of smoking and currently smoke or have quit within the past 15 years. Fecal occult blood test (FOBT) of the stool. You may have this test every year starting at age 35. Flexible sigmoidoscopy or colonoscopy. You may have a sigmoidoscopy every 5 years or a colonoscopy every 10 years starting at age 67. Hepatitis C blood test. Hepatitis B blood test. Sexually transmitted disease (STD) testing. Diabetes screening. This is done by checking your blood sugar (glucose) after you have not eaten for a while (fasting). You may have this done every 1-3 years. Bone density scan. This is done to screen for osteoporosis. You may have this done starting at age 24. Mammogram. This may be done every 1-2 years. Talk to your health care provider about how often you should have regular mammograms. Talk with your health care provider about your test results, treatment options, and if necessary, the need for more tests. Vaccines  Your health care provider may recommend certain vaccines, such as: Influenza vaccine. This is recommended every year. Tetanus, diphtheria, and acellular pertussis (Tdap,  Td) vaccine. You may need a Td booster every 10 years. Zoster vaccine. You may need this after age  87. Pneumococcal 13-valent conjugate (PCV13) vaccine. One dose is recommended after age 93. Pneumococcal polysaccharide (PPSV23) vaccine. One dose is recommended after age 74. Talk to your health care provider about which screenings and vaccines you need and how often you need them. This information is not intended to replace advice given to you by your health care provider. Make sure you discuss any questions you have with your health care provider. Document Released: 10/19/2015 Document Revised: 06/11/2016 Document Reviewed: 07/24/2015 Elsevier Interactive Patient Education  2017 [place] Prevention in the Home Falls can cause injuries. They can happen to people of all ages. There are many things you can do to make your home safe and to help prevent falls. What can I do on the outside of my home? Regularly fix the edges of walkways and driveways and fix any cracks. Remove anything that might make you trip as you walk through a door, such as a raised step or threshold. Trim any bushes or trees on the path to your home. Use bright outdoor lighting. Clear any walking paths of anything that might make someone trip, such as rocks or tools. Regularly check to see if handrails are loose or broken. Make sure that both sides of any steps have handrails. Any raised decks and porches should have guardrails on the edges. Have any leaves, snow, or ice cleared regularly. Use sand or salt on walking paths during winter. Clean up any spills in your garage right away. This includes oil or grease spills. What can I do in the bathroom? Use night lights. Install grab bars by the toilet and in the tub and shower. Do not use towel bars as grab bars. Use non-skid mats or decals in the tub or shower. If you need to sit down in the shower, use a plastic, non-slip stool. Keep the floor dry. Clean up any water that spills on the floor as soon as it happens. Remove soap buildup in the tub or shower  regularly. Attach bath mats securely with double-sided non-slip rug tape. Do not have throw rugs and other things on the floor that can make you trip. What can I do in the bedroom? Use night lights. Make sure that you have a light by your bed that is easy to reach. Do not use any sheets or blankets that are too big for your bed. They should not hang down onto the floor. Have a firm chair that has side arms. You can use this for support while you get dressed. Do not have throw rugs and other things on the floor that can make you trip. What can I do in the kitchen? Clean up any spills right away. Avoid walking on wet floors. Keep items that you use a lot in easy-to-reach places. If you need to reach something above you, use a strong step stool that has a grab bar. Keep electrical cords out of the way. Do not use floor polish or wax that makes floors slippery. If you must use wax, use non-skid floor wax. Do not have throw rugs and other things on the floor that can make you trip. What can I do with my stairs? Do not leave any items on the stairs. Make sure that there are handrails on both sides of the stairs and use them. Fix handrails that are broken or loose. Make sure that handrails are as  long as the stairways. Check any carpeting to make sure that it is firmly attached to the stairs. Fix any carpet that is loose or worn. Avoid having throw rugs at the top or bottom of the stairs. If you do have throw rugs, attach them to the floor with carpet tape. Make sure that you have a light switch at the top of the stairs and the bottom of the stairs. If you do not have them, ask someone to add them for you. What else can I do to help prevent falls? Wear shoes that: Do not have high heels. Have rubber bottoms. Are comfortable and fit you well. Are closed at the toe. Do not wear sandals. If you use a stepladder: Make sure that it is fully opened. Do not climb a closed stepladder. Make sure that  both sides of the stepladder are locked into place. Ask someone to hold it for you, if possible. Clearly mark and make sure that you can see: Any grab bars or handrails. First and last steps. Where the edge of each step is. Use tools that help you move around (mobility aids) if they are needed. These include: Canes. Walkers. Scooters. Crutches. Turn on the lights when you go into a dark area. Replace any light bulbs as soon as they burn out. Set up your furniture so you have a clear path. Avoid moving your furniture around. If any of your floors are uneven, fix them. If there are any pets around you, be aware of where they are. Review your medicines with your doctor. Some medicines can make you feel dizzy. This can increase your chance of falling. Ask your doctor what other things that you can do to help prevent falls. This information is not intended to replace advice given to you by your health care provider. Make sure you discuss any questions you have with your health care provider. Document Released: 07/19/2009 Document Revised: 02/28/2016 Document Reviewed: 10/27/2014 Elsevier Interactive Patient Education  2017 [employer].

## 2021-12-03 DIAGNOSIS — I10 Essential (primary) hypertension: Secondary | ICD-10-CM

## 2021-12-03 DIAGNOSIS — E785 Hyperlipidemia, unspecified: Secondary | ICD-10-CM

## 2022-02-06 DIAGNOSIS — H5203 Hypermetropia, bilateral: Secondary | ICD-10-CM | POA: Diagnosis not present

## 2022-02-14 ENCOUNTER — Ambulatory Visit (INDEPENDENT_AMBULATORY_CARE_PROVIDER_SITE_OTHER): Payer: Medicare HMO | Admitting: Family Medicine

## 2022-02-14 ENCOUNTER — Encounter: Payer: Self-pay | Admitting: Family Medicine

## 2022-02-14 VITALS — BP 132/70 | HR 66 | Temp 98.0°F | Ht 60.0 in | Wt 177.0 lb

## 2022-02-14 DIAGNOSIS — E785 Hyperlipidemia, unspecified: Secondary | ICD-10-CM

## 2022-02-14 DIAGNOSIS — M810 Age-related osteoporosis without current pathological fracture: Secondary | ICD-10-CM

## 2022-02-14 DIAGNOSIS — I10 Essential (primary) hypertension: Secondary | ICD-10-CM

## 2022-02-14 DIAGNOSIS — R739 Hyperglycemia, unspecified: Secondary | ICD-10-CM

## 2022-02-14 DIAGNOSIS — Z853 Personal history of malignant neoplasm of breast: Secondary | ICD-10-CM | POA: Diagnosis not present

## 2022-02-14 DIAGNOSIS — Z0000 Encounter for general adult medical examination without abnormal findings: Secondary | ICD-10-CM | POA: Diagnosis not present

## 2022-02-14 DIAGNOSIS — D692 Other nonthrombocytopenic purpura: Secondary | ICD-10-CM

## 2022-02-14 DIAGNOSIS — R319 Hematuria, unspecified: Secondary | ICD-10-CM | POA: Diagnosis not present

## 2022-02-14 LAB — CBC WITH DIFFERENTIAL/PLATELET
Basophils Absolute: 0.1 10*3/uL (ref 0.0–0.1)
Basophils Relative: 1.2 % (ref 0.0–3.0)
Eosinophils Absolute: 0.1 10*3/uL (ref 0.0–0.7)
Eosinophils Relative: 2.9 % (ref 0.0–5.0)
HCT: 40.4 % (ref 36.0–46.0)
Hemoglobin: 13.3 g/dL (ref 12.0–15.0)
Lymphocytes Relative: 18.2 % (ref 12.0–46.0)
Lymphs Abs: 0.9 10*3/uL (ref 0.7–4.0)
MCHC: 32.9 g/dL (ref 30.0–36.0)
MCV: 93 fl (ref 78.0–100.0)
Monocytes Absolute: 0.3 10*3/uL (ref 0.1–1.0)
Monocytes Relative: 7.2 % (ref 3.0–12.0)
Neutro Abs: 3.3 10*3/uL (ref 1.4–7.7)
Neutrophils Relative %: 70.5 % (ref 43.0–77.0)
Platelets: 141 10*3/uL — ABNORMAL LOW (ref 150.0–400.0)
RBC: 4.34 Mil/uL (ref 3.87–5.11)
RDW: 14.1 % (ref 11.5–15.5)
WBC: 4.7 10*3/uL (ref 4.0–10.5)

## 2022-02-14 LAB — COMPREHENSIVE METABOLIC PANEL
ALT: 16 U/L (ref 0–35)
AST: 16 U/L (ref 0–37)
Albumin: 4.4 g/dL (ref 3.5–5.2)
Alkaline Phosphatase: 50 U/L (ref 39–117)
BUN: 17 mg/dL (ref 6–23)
CO2: 31 mEq/L (ref 19–32)
Calcium: 9.7 mg/dL (ref 8.4–10.5)
Chloride: 104 mEq/L (ref 96–112)
Creatinine, Ser: 0.92 mg/dL (ref 0.40–1.20)
GFR: 57.69 mL/min — ABNORMAL LOW (ref >60.00)
Glucose, Bld: 104 mg/dL — ABNORMAL HIGH (ref 70–99)
Potassium: 4.9 mEq/L (ref 3.5–5.1)
Sodium: 141 mEq/L (ref 135–145)
Total Bilirubin: 0.6 mg/dL (ref 0.2–1.2)
Total Protein: 7 g/dL (ref 6.0–8.3)

## 2022-02-14 LAB — LIPID PANEL
Cholesterol: 176 mg/dL (ref 0–200)
HDL: 69.7 mg/dL (ref >39.00)
LDL Cholesterol: 94 mg/dL (ref 0–99)
NonHDL: 106.42
Total CHOL/HDL Ratio: 3
Triglycerides: 64 mg/dL (ref 0.0–149.0)
VLDL: 12.8 mg/dL (ref 0.0–40.0)

## 2022-02-14 LAB — URINALYSIS, MICROSCOPIC ONLY: RBC / HPF: NONE SEEN (ref 0–?)

## 2022-02-14 LAB — HEMOGLOBIN A1C: Hgb A1c MFr Bld: 6.1 % (ref 4.6–6.5)

## 2022-02-14 NOTE — Patient Instructions (Addendum)
Please stop by lab before you go ?If you have mychart- we will send your results within 3 business days of [place] receiving them.  ?If you do not have mychart- we will call you about results within 5 business days of [place] receiving them.  ?*please also note that you will see labs on mychart as soon as they post. I will later go in and write notes on them- will say "notes from Dr. Yong Channel"  ? ?Glad you are doing so well!  ? ?Great job on 3 lbs weight loss- keep up the gradual weight loss! We could try ozempic as we discussed but you wanted to hold off on weekly home injection for now ? ?Recommended follow up: Return in about 6 months (around 08/17/2022) for followup or sooner if needed.Schedule b4 you leave. ?

## 2022-02-14 NOTE — Progress Notes (Signed)
?Phone [phone] ?  ?Subjective:  ?Patient presents today for their annual physical. Chief complaint-noted.  ? ?See problem oriented charting- ?ROS- full  review of systems was completed and negative per full ROS sheet ? ?The following were reviewed and entered/updated in epic: ?Past Medical History:  ?Diagnosis Date  ? Breast cancer ([place])   ? Cancer [facility])   ? breast cancer- left  ? Heart murmur   ? years ago. no recent issues  ? Hyperlipidemia   ? Hypertension   ? per pt treated in past  ? Osteoporosis 01/20/2014  ? (5) DEXA scan 08/31/2013 shows osteopenia with a T score of -2.0; zolendronate started April 2015, to be repeated every 6 months while on anastrozole.  2016 dexa stable. Repeat 2019  ? Personal history of chemotherapy   ? Personal history of radiation therapy   ? ?Patient Active Problem List  ? Diagnosis Date Noted  ? History of septic arthritis 07/26/2018  ?  Priority: High  ? Osteoporosis 01/20/2014  ?  Priority: High  ? History of breast cancer 06/30/2013  ?  Priority: High  ? Hyperlipidemia 02/02/2017  ?  Priority: Medium   ? Hyperglycemia 03/19/2015  ?  Priority: Medium   ? Essential hypertension 08/03/2008  ?  Priority: Medium   ? Choledocholithiasis with obstruction 09/03/2020  ?  Priority: Low  ? Senile purpura ([place]) 01/25/2020  ?  Priority: Low  ? Cellulitis of left breast 03/28/2017  ?  Priority: Low  ? SKIN CANCER, HX OF 08/03/2008  ?  Priority: Low  ? ?Past Surgical History:  ?Procedure Laterality Date  ? ABDOMINAL HYSTERECTOMY  1987  ? fibroids  ? BREAST BIOPSY    ? BREAST LUMPECTOMY  10/23/10  ? left  ? CHOLECYSTECTOMY N/A 07/30/2020  ? Procedure: LAPAROSCOPIC CHOLECYSTECTOMY WITH INTRAOPERATIVE CHOLANGIOGRAM AND ICG DYE;  Surgeon: Donnie Mesa, MD;  Location: [place];  Service: General;  Laterality: N/A;  ? ENDOSCOPIC RETROGRADE CHOLANGIOPANCREATOGRAPHY (ERCP) WITH PROPOFOL N/A 08/01/2020  ? Procedure: ENDOSCOPIC RETROGRADE CHOLANGIOPANCREATOGRAPHY (ERCP) WITH PROPOFOL;  Surgeon:  Milus Banister, MD;  Location: [facility] ENDOSCOPY;  Service: Endoscopy;  Laterality: N/A;  ? PANCREATIC STENT PLACEMENT  08/01/2020  ? Procedure: PANCREATIC STENT PLACEMENT;  Surgeon: Milus Banister, MD;  Location: [facility] ENDOSCOPY;  Service: Endoscopy;;  ? PORT-A-CATH REMOVAL  09/02/2011  ? Procedure: REMOVAL PORT-A-CATH;  Surgeon: Adin Hector, MD;  Location: [place];  Service: General;  Laterality: Right;  ? REMOVAL OF STONES  08/01/2020  ? Procedure: REMOVAL OF STONES;  Surgeon: Milus Banister, MD;  Location: [facility] ENDOSCOPY;  Service: Endoscopy;;  ? SPHINCTEROTOMY  08/01/2020  ? Procedure: SPHINCTEROTOMY;  Surgeon: Milus Banister, MD;  Location: [place];  Service: Endoscopy;;  ? TOTAL HIP ARTHROPLASTY Left 04/03/2017  ? Procedure: IRRIGATION AND DEBRIDIMENT LEFT HIP;  Surgeon: Rod Can, MD;  Location: [facility];  Service: Orthopedics;  Laterality: Left;  ? ? ?Family History  ?Problem Relation Age of Onset  ? COPD Father   ? Diabetes Mother   ? Dementia Mother   ? Kidney disease Mother   ?     not on dialysis  ? Cancer Brother   ?     jaw  ? Hypertension Brother   ? Breast cancer Paternal Aunt   ? ? ?Medications- reviewed and updated ?Current Outpatient Medications  ?Medication Sig Dispense Refill  ? BIOTIN PO Take 1,000 mcg by mouth.    ? Calcium Carbonate-Vit D-Min (CALCIUM 1200 PO) Take by  mouth.    ? cholecalciferol (VITAMIN D) 1000 units tablet Take 2,000 Units by mouth daily.    ? CYANOCOBALAMIN PO Take 1 tablet by mouth daily.     ? Multiple Vitamin (MULTIVITAMIN) tablet Take 1 tablet by mouth daily.    ? polyethylene glycol (MIRALAX / GLYCOLAX) 17 g packet Take 17 g by mouth daily.    ? pyridoxine (B-6) 100 MG tablet Take 100 mg by mouth daily.    ? rosuvastatin (CRESTOR) 10 MG tablet Take 1 tablet by mouth once a week 13 tablet 0  ? ?No current facility-administered medications for this visit.  ? ? ?Allergies-reviewed and updated ?No Known Allergies ? ?Social History  ? ?Social  History Narrative  ? Married. Lives with husband. 3 children. 3 grandkids (22, 22, 18 in 2018).   ?   ? 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   ?   ? Hobbies: working puzzles, reading, shoping, beach  ? ?Objective  ?Objective:  ?BP 132/70   Pulse 66   Temp 98 ?F (36.7 ?C)   Ht 5' (1.524 m)   Wt 177 lb (80.3 kg)   SpO2 97%   BMI 34.57 kg/m?  ?Gen: NAD, resting comfortably ?HEENT: Mucous membranes are moist. Oropharynx normal ?Neck: no thyromegaly or lymphadenopathy ?Breasts: normal appearance, no masses or tenderness- other than noted scar from lumpectomy ?CV: RRR no murmurs rubs or gallops ?Lungs: CTAB no crackles, wheeze, rhonchi ?Abdomen: soft/nontender/nondistended/normal bowel sounds. No rebound or guarding.  ?Ext: no edema ?Skin: warm, dry ?Neuro: grossly normal, moves all extremities, PERRLA ?  ?Assessment and Plan  ? ?81 y.o. female presenting for annual physical.  ?Health Maintenance counseling: ?1. Anticipatory guidance: Patient counseled regarding regular dental exams- had another crown -q6 months, eye exams - yearly,  avoiding smoking and second hand smoke , limiting alcohol to 1 beverage per day-doesn't drink , no illicit drugs .   ?2. Risk factor reduction:  Advised patient of need for regular exercise and diet rich and fruits and vegetables to reduce risk of heart attack and stroke.  ?Exercise-  cubi 15-30 minutes  last year- reports now  3-4 days a week with TV, some treadmill but limited by schedule she states ?Diet/weight management-going to weight watchers most of her life-down 3 lbs over a year. Wants to refocus on weight watchers diet ?Wt Readings from Last 3 Encounters:  ?02/14/22 177 lb (80.3 kg)  ?08/07/21 175 lb (79.4 kg)  ?02/13/21 180 lb 12.8 oz (82 kg)  ?3. Immunizations/screenings/ancillary studies- discussed prevnar 20 option- - for now hold off- since had prevnar 13 and pneumovax 23, could have another bivalent vaccine if preferred- may wait unitl the  fall ?Immunization History  ?Administered Date(s) Administered  ? Fluad Quad(high Dose 65+) 07/28/2019, 07/13/2020  ? Influenza Split 07/10/2011, 07/29/2012, 08/04/2013  ? Influenza Whole 07/18/2009  ? Influenza, High Dose Seasonal PF 08/15/2015, 07/02/2017, 07/26/2018  ? Influenza-Unspecified 07/31/2014, 07/31/2021  ? PFIZER(Purple Top)SARS-COV-2 Vaccination 10/27/2019, 11/17/2019, 05/26/2020, 04/22/2021  ? Pension scheme manager 78yr & up 08/07/2021  ? Pneumococcal Conjugate-13 11/15/2013  ? Pneumococcal Polysaccharide-23 11/18/2005  ? Td 08/02/2002  ? Tdap 10/20/2012  ? Zoster Recombinat (Shingrix) 07/26/2018, 09/30/2018  ? Zoster, Live 02/21/2009  ?4. Cervical cancer screening- no history of abnormal pap smears- past age based screening recommendations ?5. Breast cancer screening-  see below discussion with breast cancer history ?6. Colon cancer screening -  colonoscopy 2009 with no repeat due to age ?7. Skin cancer screening-  follows with Dr. Nevada Crane- skin cancer historyadvised regular sunscreen use. Denies worrisome, changing, or new skin lesions.  ?8. Birth control/STD check- postmenopausal/monogamous ?9. Osteoporosis screening at 20- see below ?10. Smoking associated screening - NEVER smoker ? ?Status of chronic or acute concerns  ? ?#History of breast cancer 2012 -treated with lumpectomy and chemotherapy.  Completed 5 years of antiestrogen therapy 12/24/2015.  Continues yearly mammograms - most recently 11/12/11 3d mammogram-opts in for exam today ? ?#hypertension ?S: in 2020 and 2021 BP trending down- has been able to come off BP medications. Most recently lisinopril stopped before 01/25/20 visit. Remains off meds today ?BP Readings from Last 3 Encounters:  ?02/14/22 132/70  ?08/07/21 (!) 147/79  ?02/13/21 134/88  ?A/P: reasonable control- continue current medication ?  ?#hyperlipidemia ?S: compliant with rosuvastatin 20 mg up to twice a week in past but has had arthralgias- back down to  rosuvastatin 10 mg once a week ?-also  Cautious about increasing due to prediabetes.  ?Lab Results  ?Component Value Date  ? CHOL 222 (H) 02/13/2021  ? HDL 61.90 02/13/2021  ? LDLCALC 141 (H) 02/13/2021  ? LDLDIRECT 141.7 11/08/2013

## 2022-02-21 ENCOUNTER — Other Ambulatory Visit: Payer: Self-pay | Admitting: Family Medicine

## 2022-02-26 DIAGNOSIS — H26491 Other secondary cataract, right eye: Secondary | ICD-10-CM | POA: Diagnosis not present

## 2022-02-26 DIAGNOSIS — H18413 Arcus senilis, bilateral: Secondary | ICD-10-CM | POA: Diagnosis not present

## 2022-02-26 DIAGNOSIS — Z961 Presence of intraocular lens: Secondary | ICD-10-CM | POA: Diagnosis not present

## 2022-02-28 ENCOUNTER — Telehealth: Payer: Self-pay | Admitting: Pharmacist

## 2022-02-28 NOTE — Progress Notes (Signed)
Chronic Care Management Pharmacy Assistant   Name: Briana Smith  MRN: [id number] DOB: January 15, 1941   Reason for Encounter: General Adherence Call   Recent office visits:  02/14/2022 [facility] (PCP) [name], MD; no medication changes indicated.  Recent consult visits:  None  Hospital visits:  None in previous 6 months  Medications: Outpatient Encounter Medications as of 02/28/2022  Medication Sig   BIOTIN PO Take 1,000 mcg by mouth.   Calcium Carbonate-Vit D-Min (CALCIUM 1200 PO) Take by mouth.   cholecalciferol (VITAMIN D) 1000 units tablet Take 2,000 Units by mouth daily.   CYANOCOBALAMIN PO Take 1 tablet by mouth daily.    Multiple Vitamin (MULTIVITAMIN) tablet Take 1 tablet by mouth daily.   polyethylene glycol (MIRALAX / GLYCOLAX) 17 g packet Take 17 g by mouth daily.   pyridoxine (B-6) 100 MG tablet Take 100 mg by mouth daily.   rosuvastatin (CRESTOR) 10 MG tablet Take 1 tablet by mouth once a week   No facility-administered encounter medications on file as of 02/28/2022.   Contacted [place] for [employer] Review Call   Chart Review:  Have there been any documented new, changed, or discontinued medications since last visit? No Has there been any documented recent hospitalizations or ED visits since last visit with Clinical Pharmacist? No Brief Summary   Adherence Review:  Does the Clinical Pharmacist Assistant have access to adherence rates? Yes Adherence rates for STAR metric medications: Rosuvastatin 10 mg last filled 02/21/2022 90 DS Does the patient have >5 day gap between last estimated fill dates for any of the above medications or other medication gaps? No Reason for medication gaps.   Disease State Questions:  Able to connect with Patient? Yes Did patient have any problems with their health recently? No Have you had any admissions or emergency room visits or worsening of your condition(s) since last visit? No Have you had any visits  with new specialists or providers since your last visit? No Have you had any new health care problem(s) since your last visit? No Have you run out of any of your medications since you last spoke with clinical pharmacist? No Are there any medications you are not taking as prescribed? No Are you having any issues or side effects with your medications? No Do you have any other health concerns or questions you want to discuss with your Clinical Pharmacist before your next visit? No Are there any health concerns that you feel we can do a better job addressing? No Are you having any problems with any of the following since the last visit:  None 12. Any falls since last visit? No 13. Any increased or uncontrolled pain since last visit? No  Patient states she had her cholesterol checked about two weeks ago. She said it has improved.  Lab Results  Component Value Date   CHOL 176 02/14/2022   CHOL 222 (H) 02/13/2021   CHOL 251 (H) 08/15/2020   Lab Results  Component Value Date   HDL 69.70 02/14/2022   HDL 61.90 02/13/2021   HDL 58 08/15/2020   Lab Results  Component Value Date   LDLCALC 94 02/14/2022   LDLCALC 141 (H) 02/13/2021   LDLCALC 165 (H) 08/15/2020   Lab Results  Component Value Date   TRIG 64.0 02/14/2022   TRIG 97.0 02/13/2021   TRIG 139 08/15/2020   Lab Results  Component Value Date   CHOLHDL 3 02/14/2022   CHOLHDL 4 02/13/2021   CHOLHDL 4.3  08/15/2020   Lab Results  Component Value Date   LDLDIRECT 141.7 11/08/2013   LDLDIRECT 116.5 10/13/2012   LDLDIRECT 126.5 09/08/2011     Care Gaps: Medicare Annual Wellness: Completed 11/19/2021  Hemoglobin A1C: 6.1% on 02/14/2022 Colonoscopy: 08/22/2008 Dexa Scan: Completed Mammogram: Next due on 11/11/2022  Future Appointments  Date Time Provider [place]  05/20/2022  3:45 PM [facility] CCM PHARMACIST [facility]  02/17/2023  9:00 AM [name], MD [facility]   Star Rating Drugs: Rosuvastatin 10 mg  last filled 02/21/2022 90 DS  April D Calhoun, [place] Pharmacist Assistant [phone]

## 2022-03-05 DIAGNOSIS — H26491 Other secondary cataract, right eye: Secondary | ICD-10-CM | POA: Diagnosis not present

## 2022-05-06 DIAGNOSIS — L820 Inflamed seborrheic keratosis: Secondary | ICD-10-CM | POA: Diagnosis not present

## 2022-05-06 DIAGNOSIS — L570 Actinic keratosis: Secondary | ICD-10-CM | POA: Diagnosis not present

## 2022-05-06 DIAGNOSIS — C44629 Squamous cell carcinoma of skin of left upper limb, including shoulder: Secondary | ICD-10-CM | POA: Diagnosis not present

## 2022-05-06 DIAGNOSIS — X32XXXD Exposure to sunlight, subsequent encounter: Secondary | ICD-10-CM | POA: Diagnosis not present

## 2022-05-20 ENCOUNTER — Telehealth: Payer: Medicare HMO

## 2022-06-03 ENCOUNTER — Other Ambulatory Visit: Payer: Self-pay | Admitting: Family Medicine

## 2022-06-17 DIAGNOSIS — C44629 Squamous cell carcinoma of skin of left upper limb, including shoulder: Secondary | ICD-10-CM | POA: Diagnosis not present

## 2022-06-20 NOTE — Progress Notes (Signed)
Chronic Care Management Pharmacy Note   Summary:  PharmD FU visit.  Patient still doing very well, only on 1 prescription med Crestor - still taking once weekly.  LDL much improved based on last lab results.  Continues to be active with exercise 4-5 days per week at home.  Recommendations: I think LDL has improved enough to keep the dose the same  FU 6 months  06/30/2022 Name:  Briana Smith MRN:  [id number] DOB:  1941-01-10  Subjective: Briana Smith is an 81 y.o. year old female who is a primary patient of [name], [name], MD.  The CCM team was consulted for assistance with disease management and care coordination needs.   Engaged with patient by telephone for follow up visit in response to provider referral for pharmacy case management and/or care coordination services.   Consent to Services:  The patient was given information about Chronic Care Management services, agreed to services, and gave verbal consent prior to initiation of services.  Please see initial visit note for detailed documentation.  Patient Care Team: [name], MD as PCP - General (Family Medicine) [name], MD as Consulting Physician (Dermatology) Edythe Clarity, [facility] (Pharmacist)  Recent office visits: 08/15/2020 (PCP): DEXA- osteopenia, major hip >3%. Decided on repeat scan in two years then consider fosamax. Lipids elevated, consider low dose once weekly rosuvastatin 10 mg 07/13/2020 (Dr. Jerline Pain): epigastric abdominal pian  Objective: Lab Results  Component Value Date   CREATININE 0.92 02/14/2022   BUN 17 02/14/2022   GFR 57.69 (L) 02/14/2022   GFRNONAA 63 08/15/2020   GFRAA 73 08/15/2020   NA 141 02/14/2022   K 4.9 02/14/2022   CALCIUM 9.7 02/14/2022   CO2 31 02/14/2022   Lab Results  Component Value Date/Time   HGBA1C 6.1 02/14/2022 09:16 AM   HGBA1C 6.2 02/13/2021 08:48 AM   GFR 57.69 (L) 02/14/2022 09:16 AM   GFR 56.61 (L) 02/13/2021 08:48 AM    Last diabetic Eye  exam: No results found for: "HMDIABEYEEXA"  Last diabetic Foot exam: No results found for: "HMDIABFOOTEX"  Lab Results  Component Value Date   CHOL 176 02/14/2022   HDL 69.70 02/14/2022   LDLCALC 94 02/14/2022   LDLDIRECT 141.7 11/08/2013   TRIG 64.0 02/14/2022   CHOLHDL 3 02/14/2022      Latest Ref Rng & Units 02/14/2022    9:16 AM 02/13/2021    8:48 AM 09/03/2020   10:05 AM  Hepatic Function  Total Protein 6.0 - 8.3 g/dL 7.0  7.1  7.4   Albumin 3.5 - 5.2 g/dL 4.4  4.2  4.2   AST 0 - 37 U/L '16  18  19   ' ALT 0 - 35 U/L '16  19  23   ' Alk Phosphatase 39 - 117 U/L 50  51  62   Total Bilirubin 0.2 - 1.2 mg/dL 0.6  0.6  0.5   Bilirubin, Direct 0.0 - 0.3 mg/dL   0.1    Lab Results  Component Value Date/Time   TSH 3.60 07/28/2019 09:27 AM   TSH 1.104 03/28/2017 12:14 PM   TSH 2.99 01/21/2016 08:45 AM   FREET4 0.8 01/19/2009 03:05 PM      Latest Ref Rng & Units 02/14/2022    9:16 AM 02/13/2021    8:48 AM 08/15/2020    9:34 AM  CBC  WBC 4.0 - 10.5 K/uL 4.7  5.3  5.3   Hemoglobin 12.0 - 15.0 g/dL 13.3  13.5  13.1   Hematocrit 36.0 - 46.0 % 40.4  40.8  40.7   Platelets 150.0 - 400.0 K/uL 141.0  147.0  326    No results found for: "VD25OH"  08/15/2020 (PCP): DEXA- osteopenia, major hip >3%. Decided on repeat scan in two years then consider fosamax  Clinical ASCVD: No  The ASCVD Risk score (Arnett DK, et al., 2019) failed to calculate for the following reasons:   The 2019 ASCVD risk score is only valid for ages 16 to 85       11/19/2021   11:03 AM 08/07/2021    8:24 AM 02/13/2021    8:00 AM  Depression screen PHQ 2/9  Decreased Interest 0 0 0  Down, Depressed, Hopeless 0 0 0  PHQ - 2 Score 0 0 0  Altered sleeping   0  Tired, decreased energy   0  Change in appetite   0  Feeling bad or failure about yourself    0  Trouble concentrating   0  Moving slowly or fidgety/restless   0  Suicidal thoughts   0  PHQ-9 Score   0  Difficult doing work/chores   Not difficult at all     Social History   Tobacco Use  Smoking Status Never  Smokeless Tobacco Never   BP Readings from Last 3 Encounters:  02/14/22 132/70  08/07/21 (!) 147/79  02/13/21 134/88   Pulse Readings from Last 3 Encounters:  02/14/22 66  08/07/21 65  02/13/21 65   Wt Readings from Last 3 Encounters:  02/14/22 177 lb (80.3 kg)  08/07/21 175 lb (79.4 kg)  02/13/21 180 lb 12.8 oz (82 kg)   Assessment/Interventions: Review of patient past medical history, allergies, medications, health status, including review of consultants reports, laboratory and other test data, was performed as part of comprehensive evaluation and provision of chronic care management services.   SDOH:  (Social Determinants of Health) assessments and interventions performed: No done within the year Financial Resource Strain: Low Risk  (11/19/2021)   Overall Financial Resource Strain (CARDIA)    Difficulty of Paying Living Expenses: Not hard at all    SDOH Interventions    Flowsheet Row Chronic Care Management from 05/21/2020 in [place] Interventions   Physical Activity Interventions Local [place] No Known Allergies Medications Reviewed Today     Reviewed by Edythe Clarity, [facility] (Pharmacist) on 06/30/22 at 79  Med List Status: <None>   Medication Order Taking? Sig Documenting Provider Last Dose Status Informant  BIOTIN PO [id number] Yes Take 1,000 mcg by mouth. [provider] Taking Active   Calcium Carbonate-Vit D-Min (CALCIUM 1200 PO) [id number] Yes Take by mouth. [provider] Taking Active   cholecalciferol (VITAMIN D) 1000 units tablet [id number] Yes Take 2,000 Units by mouth daily. [provider] Taking Active Self  CYANOCOBALAMIN PO [id number] Yes Take 1 tablet by mouth daily.  [provider] Taking Active Self  Multiple Vitamin (MULTIVITAMIN) tablet [id number] Yes Take 1 tablet by mouth daily. [provider]  Taking Active Self  polyethylene glycol (MIRALAX / GLYCOLAX) 17 g packet [id number] Yes Take 17 g by mouth daily. [provider] Taking Active   pyridoxine (B-6) 100 MG tablet [id number] Yes Take 100 mg by mouth daily. [provider] Taking Active   rosuvastatin (CRESTOR) 10 MG tablet [id number] Yes Take 1 tablet by mouth once a week [name], MD Taking Active  Patient Active Problem List   Diagnosis Date Noted   Choledocholithiasis with obstruction 09/03/2020   Senile purpura ([place]) 01/25/2020   History of septic arthritis 07/26/2018   Cellulitis of left breast 03/28/2017   Hyperlipidemia 02/02/2017   Hyperglycemia 03/19/2015   Osteoporosis 01/20/2014   History of breast cancer 06/30/2013   Essential hypertension 08/03/2008   SKIN CANCER, HX OF 08/03/2008   Immunization History  Administered Date(s) Administered   Fluad Quad(high Dose 65+) 07/28/2019, 07/13/2020   Influenza Split 07/10/2011, 07/29/2012, 08/04/2013   Influenza Whole 07/18/2009   Influenza, High Dose Seasonal PF 08/15/2015, 07/02/2017, 07/26/2018   Influenza-Unspecified 07/31/2014, 07/31/2021   PFIZER(Purple Top)SARS-COV-2 Vaccination 10/27/2019, 11/17/2019, 05/26/2020, 04/22/2021   Pfizer Covid-19 Vaccine Bivalent Booster 34yr & up 08/07/2021   Pneumococcal Conjugate-13 11/15/2013   Pneumococcal Polysaccharide-23 11/18/2005   Td 08/02/2002   Tdap 10/20/2012   Zoster Recombinat (Shingrix) 07/26/2018, 09/30/2018   Zoster, Live 02/21/2009   Conditions to be addressed/monitored:  Hypertension, Hyperlipidemia and Osteopenia Care Plan : [place] Updates made by DEdythe Clarity RPH since 06/30/2022 12:00 AM     Problem: Hypertension, Hyperlipidemia and Osteopenia      Long-Range Goal: Disease state management   Start Date: 11/26/2020  Expected End Date: 11/26/2021  Recent Progress: On track  Priority: High  Note:   Current Barriers:  Elevated LDL  cholesterol  Pharmacist Clinical Goal(s):  Over the next 365 days, patient will verbalize ability to afford treatment regimen contact provider office for questions/concerns as evidenced notation of same in electronic health record through collaboration with PharmD and provider.   Interventions: 1:1 collaboration with HMarin Olp MD regarding development and update of comprehensive plan of care as evidenced by provider attestation and co-signature Inter-disciplinary care team collaboration (see longitudinal plan of care) Comprehensive medication review performed; medication list updated in electronic medical record Hypertension (BP goal <140/90) 06/30/22 -controlled -Current treatment: n/a -Medications previously tried: 120-130/60-70s -Current home readings: 120-130/60-70s -Current dietary habits: same see previous -Current exercise habits: still using her cubii a few times per week, does it 4-5 times per week for 20-30 minutes. -Educated on BP goals and benefits of medications for prevention of heart attack, stroke and kidney damage; Daily salt intake goal < 2300 mg; Exercise goal of 150 minutes per week; BP at home controlled, denies any symptoms. Continue current management for now, stay as active as possible!!  Update 05/13/21 124/62, 124/70, 127/67, 135/63 - home readings recently She has not had any dizziness.headaches Still doing her cubii exercise machine Continue current management  Hyperlipidemia: (LDL goal < 100) 06/30/22 -Controlled, LDL at goal most recent PCP visit -Current treatment: Rosuvastatin 10 mg once weekly- Appropriate, Query effective,,  -Medications previously tried: rosuvastatin 10 mg once/twice weekly  -Current exercise habits: see htn -Educated on Cholesterol goals;  Benefits of statin for ASCVD risk reduction; Importance of limiting foods high in cholesterol; -Most recent lipid panel had improved but LDL still slightly above goal.  She is  tolerating medication well.  Some discussion last OV with PCP that she would be willing to try twice weekly.  Will consult with PCP, she is still willing to try this but I do not want her to have myalgia and stop all together. Changes pending discussion with PCP.  Update 05/13/21 LDL elevated in may - has been taking the Crestor weekly since Still tolerating! Recommend recheck lipids at some point before next physical. If LDL still elevated, could increase the frequency of which she  is taking - titrating up slowly to promote tolerance.  Update 11/18/21 Continues to tolerate once weekly dose of statin. Has upcoming physical in May Will recheck lipids - patient inquires about taking twice weekly because her pharmacist told her there was no point in the once weekly dose. Encouraged her to continue as is for now - recheck lipids and we can adjust if needed. FU after OV in May.  Hyperglycemia(A1c goal <5.7) -Not ideally controlled, not assessed -Current medications: None -Current home glucose readings fasting glucose:  97, 98 -Denies hypoglycemic/hyperglycemic symptoms -Current exercise: cubii exercise machine -Educated on A1c and blood sugar goals; Exercise goal of 150 minutes per week; Benefits of routine self-monitoring of blood sugar; -Counseled to check feet daily and get yearly eye exams -She has cut back on sugars and carbs and really noticing a difference in her sugar and overall health.  Feels much better!! -Recommended continue current lifestyle mods! -Recheck A1c next OV!  Update 11/18/21 136 Fasting sugar this morning - she had sweet potato with brown sugar/splenda sweetener on top. Explained the effect carbohydrates can have on blood sugars. She plans to monitor more often to see how she reacts to certain foods. Denies other increased fasting glucose readings. Still controlled on lifestyle. Recheck A1c in May, no other changes needed at this time.  Patient  Goals/Self-Care Activities Over the next 365 days, patient will:  - take medications as prescribed check blood pressure every couple of weeks, document, and provide at future appointment          Medication Assistance: None required.  Patient affirms current coverage meets needs.  Compliance/Adherence/Medication fill history: Care Gaps: None noted  Star-Rating Drugs: 06/03/22 90ds Rosuvastatin 33m [facility]  [facility] [place] [place]Phone: [phone]: [phone] Patient decided to: Continue current medication management strategy Patient agrees to Care Plan and Follow-up.   Patient Goals/Self-Care Activities Over the next 365 days, patient will:  - take medications as prescribed check blood pressure every couple of weeks, document, and provide at future appointments  Future Appointments  Date Time Provider [place] 02/17/2023  9:00 AM HYong Channel SBrayton Mars MD [facility]   Follow-up plan with Care Management Team: CPA: call to assess tolerance if switch statin frequency RPH: 6 month telephone visit  CBeverly Milch PharmD Clinical Pharmacist  [phone]

## 2022-06-30 ENCOUNTER — Ambulatory Visit: Payer: Medicare HMO | Admitting: Pharmacist

## 2022-06-30 DIAGNOSIS — I10 Essential (primary) hypertension: Secondary | ICD-10-CM

## 2022-06-30 DIAGNOSIS — E785 Hyperlipidemia, unspecified: Secondary | ICD-10-CM

## 2022-06-30 NOTE — Patient Instructions (Addendum)
Visit Information   Goals Addressed             This Visit's Progress    Track and Manage My Blood Pressure-Hypertension   On track    Timeframe:  Long-Range Goal Priority:  High Start Date:  05/14/21                           Expected End Date:   11/14/21                    Follow Up Date 09/04/21    - check blood pressure weekly - choose a place to take my blood pressure (home, clinic or office, retail store) - write blood pressure results in a log or diary    Why is this important?   You won't feel high blood pressure, but it can still hurt your blood vessels.  High blood pressure can cause heart or kidney problems. It can also cause a stroke.  Making lifestyle changes like losing a little weight or eating less salt will help.  Checking your blood pressure at home and at different times of the day can help to control blood pressure.  If the doctor prescribes medicine remember to take it the way the doctor ordered.  Call the office if you cannot afford the medicine or if there are questions about it.     Notes:        Patient Care Plan: CCM Pharmacy Care Plan     Problem Identified: Hypertension, Hyperlipidemia and Osteopenia      Long-Range Goal: Disease state management   Start Date: 11/26/2020  Expected End Date: 11/26/2021  Recent Progress: On track  Priority: High  Note:   Current Barriers:  Elevated LDL cholesterol  Pharmacist Clinical Goal(s):  Over the next 365 days, patient will verbalize ability to afford treatment regimen contact provider office for questions/concerns as evidenced notation of same in electronic health record through collaboration with PharmD and provider.   Interventions: 1:1 collaboration with [name], MD regarding development and update of comprehensive plan of care as evidenced by provider attestation and co-signature Inter-disciplinary care team collaboration (see longitudinal plan of care) Comprehensive medication  review performed; medication list updated in electronic medical record Hypertension (BP goal <140/90) 06/30/22 -controlled -Current treatment: n/a -Medications previously tried: 120-130/60-70s -Current home readings: 120-130/60-70s -Current dietary habits: same see previous -Current exercise habits: still using her cubii a few times per week, does it 4-5 times per week for 20-30 minutes. -Educated on BP goals and benefits of medications for prevention of heart attack, stroke and kidney damage; Daily salt intake goal < 2300 mg; Exercise goal of 150 minutes per week; BP at home controlled, denies any symptoms. Continue current management for now, stay as active as possible!!  Update 05/13/21 124/62, 124/70, 127/67, 135/63 - home readings recently She has not had any dizziness.headaches Still doing her cubii exercise machine Continue current management  Hyperlipidemia: (LDL goal < 100) 06/30/22 -Controlled, LDL at goal most recent PCP visit -Current treatment: Rosuvastatin 10 mg once weekly- Appropriate, Query effective,,  -Medications previously tried: rosuvastatin 10 mg once/twice weekly  -Current exercise habits: see htn -Educated on Cholesterol goals;  Benefits of statin for ASCVD risk reduction; Importance of limiting foods high in cholesterol; -Most recent lipid panel had improved but LDL still slightly above goal.  She is tolerating medication well.  Some discussion last OV with PCP that she would be  willing to try twice weekly.  Will consult with PCP, she is still willing to try this but I do not want her to have myalgia and stop all together. Changes pending discussion with PCP.  Update 05/13/21 LDL elevated in may - has been taking the Crestor weekly since Still tolerating! Recommend recheck lipids at some point before next physical. If LDL still elevated, could increase the frequency of which she is taking - titrating up slowly to promote tolerance.  Update  11/18/21 Continues to tolerate once weekly dose of statin. Has upcoming physical in May Will recheck lipids - patient inquires about taking twice weekly because her pharmacist told her there was no point in the once weekly dose. Encouraged her to continue as is for now - recheck lipids and we can adjust if needed. FU after OV in May.  Hyperglycemia(A1c goal <5.7) -Not ideally controlled, not assessed -Current medications: None -Current home glucose readings fasting glucose:  97, 98 -Denies hypoglycemic/hyperglycemic symptoms -Current exercise: cubii exercise machine -Educated on A1c and blood sugar goals; Exercise goal of 150 minutes per week; Benefits of routine self-monitoring of blood sugar; -Counseled to check feet daily and get yearly eye exams -She has cut back on sugars and carbs and really noticing a difference in her sugar and overall health.  Feels much better!! -Recommended continue current lifestyle mods! -Recheck A1c next OV!  Update 11/18/21 136 Fasting sugar this morning - she had sweet potato with brown sugar/splenda sweetener on top. Explained the effect carbohydrates can have on blood sugars. She plans to monitor more often to see how she reacts to certain foods. Denies other increased fasting glucose readings. Still controlled on lifestyle. Recheck A1c in May, no other changes needed at this time.  Patient Goals/Self-Care Activities Over the next 365 days, patient will:  - take medications as prescribed check blood pressure every couple of weeks, document, and provide at future appointment         The patient verbalized understanding of instructions, educational materials, and care plan provided today and DECLINED offer to receive copy of patient instructions, educational materials, and care plan.  Telephone follow up appointment with pharmacy team member scheduled for: 6 months  Edythe Clarity, [place], PharmD Clinical Pharmacist   [facility] [phone]

## 2022-07-22 DIAGNOSIS — Z08 Encounter for follow-up examination after completed treatment for malignant neoplasm: Secondary | ICD-10-CM | POA: Diagnosis not present

## 2022-07-22 DIAGNOSIS — Z85828 Personal history of other malignant neoplasm of skin: Secondary | ICD-10-CM | POA: Diagnosis not present

## 2022-07-22 DIAGNOSIS — D485 Neoplasm of uncertain behavior of skin: Secondary | ICD-10-CM | POA: Diagnosis not present

## 2022-07-22 DIAGNOSIS — D2271 Melanocytic nevi of right lower limb, including hip: Secondary | ICD-10-CM | POA: Diagnosis not present

## 2022-08-15 DIAGNOSIS — L98499 Non-pressure chronic ulcer of skin of other sites with unspecified severity: Secondary | ICD-10-CM | POA: Diagnosis not present

## 2022-08-15 DIAGNOSIS — D485 Neoplasm of uncertain behavior of skin: Secondary | ICD-10-CM | POA: Diagnosis not present

## 2022-08-15 DIAGNOSIS — C44319 Basal cell carcinoma of skin of other parts of face: Secondary | ICD-10-CM | POA: Diagnosis not present

## 2022-09-05 ENCOUNTER — Telehealth: Payer: Self-pay | Admitting: Family Medicine

## 2022-09-05 ENCOUNTER — Other Ambulatory Visit: Payer: Self-pay

## 2022-09-05 MED ORDER — ROSUVASTATIN CALCIUM 10 MG PO TABS: 10.0000 mg | ORAL_TABLET | ORAL | 3 refills | Status: DC

## 2022-09-05 NOTE — Telephone Encounter (Signed)
Rx sent 

## 2022-10-08 ENCOUNTER — Other Ambulatory Visit: Payer: Self-pay | Admitting: Family Medicine

## 2022-10-08 DIAGNOSIS — Z1231 Encounter for screening mammogram for malignant neoplasm of breast: Secondary | ICD-10-CM

## 2022-10-14 DIAGNOSIS — Z1283 Encounter for screening for malignant neoplasm of skin: Secondary | ICD-10-CM | POA: Diagnosis not present

## 2022-10-14 DIAGNOSIS — L0202 Furuncle of face: Secondary | ICD-10-CM | POA: Diagnosis not present

## 2022-10-14 DIAGNOSIS — L570 Actinic keratosis: Secondary | ICD-10-CM | POA: Diagnosis not present

## 2022-10-14 DIAGNOSIS — C44319 Basal cell carcinoma of skin of other parts of face: Secondary | ICD-10-CM | POA: Diagnosis not present

## 2022-10-14 DIAGNOSIS — X32XXXD Exposure to sunlight, subsequent encounter: Secondary | ICD-10-CM | POA: Diagnosis not present

## 2022-10-14 DIAGNOSIS — B9689 Other specified bacterial agents as the cause of diseases classified elsewhere: Secondary | ICD-10-CM | POA: Diagnosis not present

## 2022-10-14 DIAGNOSIS — D225 Melanocytic nevi of trunk: Secondary | ICD-10-CM | POA: Diagnosis not present

## 2022-11-12 DIAGNOSIS — H524 Presbyopia: Secondary | ICD-10-CM | POA: Diagnosis not present

## 2022-11-12 DIAGNOSIS — H52223 Regular astigmatism, bilateral: Secondary | ICD-10-CM | POA: Diagnosis not present

## 2022-11-25 DIAGNOSIS — B078 Other viral warts: Secondary | ICD-10-CM | POA: Diagnosis not present

## 2022-11-25 DIAGNOSIS — L570 Actinic keratosis: Secondary | ICD-10-CM | POA: Diagnosis not present

## 2022-11-25 DIAGNOSIS — Z85828 Personal history of other malignant neoplasm of skin: Secondary | ICD-10-CM | POA: Diagnosis not present

## 2022-11-25 DIAGNOSIS — Z08 Encounter for follow-up examination after completed treatment for malignant neoplasm: Secondary | ICD-10-CM | POA: Diagnosis not present

## 2022-11-25 DIAGNOSIS — C44311 Basal cell carcinoma of skin of nose: Secondary | ICD-10-CM | POA: Diagnosis not present

## 2022-11-25 DIAGNOSIS — X32XXXD Exposure to sunlight, subsequent encounter: Secondary | ICD-10-CM | POA: Diagnosis not present

## 2022-11-27 ENCOUNTER — Ambulatory Visit
Admission: RE | Admit: 2022-11-27 | Discharge: 2022-11-27 | Disposition: A | Discharge status: Home / Self Care | Payer: Medicare HMO | Source: Ambulatory Visit | Attending: Family Medicine | Admitting: Family Medicine

## 2022-11-27 DIAGNOSIS — Z1231 Encounter for screening mammogram for malignant neoplasm of breast: Secondary | ICD-10-CM

## 2022-12-02 ENCOUNTER — Telehealth: Payer: Self-pay | Admitting: Family Medicine

## 2022-12-02 NOTE — Telephone Encounter (Signed)
Contacted Briana Smith to schedule their annual wellness visit. Patient declined to schedule AWV at this time.  Patient states she does not want to participate in the AWV.  [facility] [phone]

## 2022-12-29 ENCOUNTER — Telehealth: Payer: Medicare HMO

## 2022-12-29 ENCOUNTER — Encounter: Payer: Medicare HMO | Admitting: Pharmacist

## 2023-01-02 ENCOUNTER — Ambulatory Visit: Payer: Medicare HMO | Admitting: Pharmacist

## 2023-01-02 NOTE — Progress Notes (Signed)
Care Management & Coordination Services Pharmacy Note  01/02/2023 Name:  Briana Smith MRN:  [id number] DOB:  Mar 03, 1941  Summary: PharmD FU visit.  Patient doing very well managing chronic conditions.  Only one rx med.  Crestor - 100% adherence.  Denies any adverse effects.  LDL well controlled to goal at last CPE - has upcoming physical in May.  Recommendations/Changes made from today's visit: Recheck lipids - adjust statin if needed  Follow up plan: FU 1 year   Subjective: Briana Smith is an 82 y.o. year old female who is a primary patient of Yong Channel, [name], MD.  The care coordination team was consulted for assistance with disease management and care coordination needs.    Engaged with patient by telephone for follow up visit.  Recent office visits: None since last CCM visit   Objective:  Lab Results  Component Value Date   CREATININE 0.92 02/14/2022   BUN 17 02/14/2022   GFR 57.69 (L) 02/14/2022   EGFR 63 (L) 01/31/2016   GFRNONAA 63 08/15/2020   GFRAA 73 08/15/2020   NA 141 02/14/2022   K 4.9 02/14/2022   CALCIUM 9.7 02/14/2022   CO2 31 02/14/2022   GLUCOSE 104 (H) 02/14/2022    Lab Results  Component Value Date/Time   HGBA1C 6.1 02/14/2022 09:16 AM   HGBA1C 6.2 02/13/2021 08:48 AM   GFR 57.69 (L) 02/14/2022 09:16 AM   GFR 56.61 (L) 02/13/2021 08:48 AM    Last diabetic Eye exam: No results found for: "HMDIABEYEEXA"  Last diabetic Foot exam: No results found for: "HMDIABFOOTEX"   Lab Results  Component Value Date   CHOL 176 02/14/2022   HDL 69.70 02/14/2022   LDLCALC 94 02/14/2022   LDLDIRECT 141.7 11/08/2013   TRIG 64.0 02/14/2022   CHOLHDL 3 02/14/2022       Latest Ref Rng & Units 02/14/2022    9:16 AM 02/13/2021    8:48 AM 09/03/2020   10:05 AM  Hepatic Function  Total Protein 6.0 - 8.3 g/dL 7.0  7.1  7.4   Albumin 3.5 - 5.2 g/dL 4.4  4.2  4.2   AST 0 - 37 U/L 16  18  19    ALT 0 - 35 U/L 16  19  23    Alk Phosphatase 39 - 117  U/L 50  51  62   Total Bilirubin 0.2 - 1.2 mg/dL 0.6  0.6  0.5   Bilirubin, Direct 0.0 - 0.3 mg/dL   0.1     Lab Results  Component Value Date/Time   TSH 3.60 07/28/2019 09:27 AM   TSH 1.104 03/28/2017 12:14 PM   TSH 2.99 01/21/2016 08:45 AM   FREET4 0.8 01/19/2009 03:05 PM       Latest Ref Rng & Units 02/14/2022    9:16 AM 02/13/2021    8:48 AM 08/15/2020    9:34 AM  CBC  WBC 4.0 - 10.5 K/uL 4.7  5.3  5.3   Hemoglobin 12.0 - 15.0 g/dL 13.3  13.5  13.1   Hematocrit 36.0 - 46.0 % 40.4  40.8  40.7   Platelets 150.0 - 400.0 K/uL 141.0  147.0  326     No results found for: "VD25OH", "VITAMINB12"  Clinical ASCVD: No  The ASCVD Risk score (Arnett DK, et al., 2019) failed to calculate for the following reasons:   The 2019 ASCVD risk score is only valid for ages 21 to 40       11/19/2021   11:03 AM  08/07/2021    8:24 AM 02/13/2021    8:00 AM  Depression screen PHQ 2/9  Decreased Interest 0 0 0  Down, Depressed, Hopeless 0 0 0  PHQ - 2 Score 0 0 0  Altered sleeping   0  Tired, decreased energy   0  Change in appetite   0  Feeling bad or failure about yourself    0  Trouble concentrating   0  Moving slowly or fidgety/restless   0  Suicidal thoughts   0  PHQ-9 Score   0  Difficult doing work/chores   Not difficult at all     Social History   Tobacco Use  Smoking Status Never  Smokeless Tobacco Never   BP Readings from Last 3 Encounters:  02/14/22 132/70  08/07/21 (!) 147/79  02/13/21 134/88   Pulse Readings from Last 3 Encounters:  02/14/22 66  08/07/21 65  02/13/21 65   Wt Readings from Last 3 Encounters:  02/14/22 177 lb (80.3 kg)  08/07/21 175 lb (79.4 kg)  02/13/21 180 lb 12.8 oz (82 kg)   BMI Readings from Last 3 Encounters:  02/14/22 34.57 kg/m  08/07/21 34.18 kg/m  02/13/21 35.31 kg/m    No Known Allergies  Medications Reviewed Today     Reviewed by Edythe Clarity, [facility] (Pharmacist) on 01/02/23 at [place] List Status: <None>    Medication Order Taking? Sig Documenting Provider Last Dose Status Informant  BIOTIN PO [id number] Yes Take 1,000 mcg by mouth. [provider] Taking Active   Calcium Carbonate-Vit D-Min (CALCIUM 1200 PO) [id number] Yes Take by mouth. [provider] Taking Active   cholecalciferol (VITAMIN D) 1000 units tablet [id number] Yes Take 2,000 Units by mouth daily. [provider] Taking Active Self  CYANOCOBALAMIN PO [id number] Yes Take 1 tablet by mouth daily.  [provider] Taking Active Self  Multiple Vitamin (MULTIVITAMIN) tablet [id number] Yes Take 1 tablet by mouth daily. [provider] Taking Active Self  polyethylene glycol (MIRALAX / GLYCOLAX) 17 g packet [id number] Yes Take 17 g by mouth daily. [provider] Taking Active   pyridoxine (B-6) 100 MG tablet [id number] Yes Take 100 mg by mouth daily. [provider] Taking Active   rosuvastatin (CRESTOR) 10 MG tablet [id number] Yes Take 1 tablet (10 mg total) by mouth once a week. [name], MD Taking Active             SDOH:  (Social Determinants of Health) assessments and interventions performed: Yes Financial Resource Strain: Low Risk  (01/02/2023)   Overall Financial Resource Strain (CARDIA)    Difficulty of Paying Living Expenses: Not very hard   Food Insecurity: No Food Insecurity (01/02/2023)   Hunger Vital Sign    Worried About Running Out of Food in the Last Year: Never true    Ran Out of Food in the Last Year: Never true    SDOH Interventions    Flowsheet Row Chronic Care Management from 05/21/2020 in [place]  SDOH Interventions   Physical Activity Interventions [facility] [employer]       Medication Assistance: None required.  Patient affirms current coverage meets needs.  Medication Access: Within the past 30 days, how often has patient missed a dose of medication? 0 Is a pillbox or other method used to improve adherence? No   Factors that may affect medication adherence? no barriers identified Are meds synced by current pharmacy? No  Are meds delivered by current pharmacy? No  Does patient experience delays in picking up medications due to transportation concerns? No   [facility] Services Reviewed: Is patient disadvantaged to use [facility]?: Yes  Current Rx insurance plan: [employer] Name and location of Current pharmacy:  [facility] [address] [place] - [place] [address] [place] [place] [address] Phone: [phone] Fax: [phone]  [facility] services reviewed with patient today?: Yes  Patient requests to transfer care to [facility]?: No  Reason patient declined to change pharmacies: Disadvantaged due to insurance/mail order  Compliance/Adherence/Medication fill history: Care Gaps: None  Star-Rating Drugs: Rosuvastatin 10mg  01/01/23 90ds   Assessment/Plan     Hypertension (BP goal <140/90) 01/02/23 -controlled -Current treatment: n/a -Medications previously tried: none noted -Current home readings: 1has not checked in a few weeks -Current dietary habits: same see previous -Current exercise habits: still using her cubii a few times per week, does it 4-5 times per week for 20-30 minutes. -Educated on BP goals and benefits of medications for prevention of heart attack, stroke and kidney damage; -Has not checked home BP in a few weeks, she denies any symptoms such as dizziness or headahces. -Monitor at home once weekly, check on BP at upcoming physical   Update 05/13/21 124/62, 124/70, 127/67, 135/63 - home readings recently She has not had any dizziness.headaches Still doing her cubii exercise machine Continue current management  Hyperlipidemia: (LDL goal < 100) 01/02/23 -Controlled, LDL at goal most recent PCP visit -Current treatment: Rosuvastatin 10 mg once weekly- Appropriate, Query effective,,  -Medications previously tried:  rosuvastatin 10 mg once/twice weekly  -Current exercise habits: see htn -Educated on Cholesterol goals;  Benefits of statin for ASCVD risk reduction; Importance of limiting foods high in cholesterol; LDL controlled at 94.  She has upcoming physical in May with plans to recheck lipids.  Tolerating once weekly dose.  No changes needed unless LDL elevated.  Continue same dose for now.  Adherence 100% - just picked medication up yesterday.  Will follow for adherence.  Update 05/13/21 LDL elevated in may - has been taking the Crestor weekly since Still tolerating! Recommend recheck lipids at some point before next physical. If LDL still elevated, could increase the frequency of which she is taking - titrating up slowly to promote tolerance.  Update 11/18/21 Continues to tolerate once weekly dose of statin. Has upcoming physical in May Will recheck lipids - patient inquires about taking twice weekly because her pharmacist told her there was no point in the once weekly dose. Encouraged her to continue as is for now - recheck lipids and we can adjust if needed. FU after OV in May.  Hyperglycemia(A1c goal <5.7) -Not ideally controlled, not assessed -Current medications: None -Current home glucose readings fasting glucose:  97, 98 -Denies hypoglycemic/hyperglycemic symptoms -Current exercise: cubii exercise machine -Educated on A1c and blood sugar goals; Exercise goal of 150 minutes per week; Benefits of routine self-monitoring of blood sugar; -Counseled to check feet daily and get yearly eye exams -She has cut back on sugars and carbs and really noticing a difference in her sugar and overall health.  Feels much better!! -Recommended continue current lifestyle mods! -Recheck A1c next OV!  Update 11/18/21 136 Fasting sugar this morning - she had sweet potato with brown sugar/splenda sweetener on top. Explained the effect carbohydrates can have on blood sugars. She plans to monitor more  often to see how she reacts to certain foods. Denies other increased fasting glucose readings. Still controlled on lifestyle. Recheck A1c in May,  no other changes needed at this time.         Beverly Milch, PharmD Clinical Pharmacist  [facility] [phone]

## 2023-01-14 DIAGNOSIS — Z08 Encounter for follow-up examination after completed treatment for malignant neoplasm: Secondary | ICD-10-CM | POA: Diagnosis not present

## 2023-01-14 DIAGNOSIS — Z85828 Personal history of other malignant neoplasm of skin: Secondary | ICD-10-CM | POA: Diagnosis not present

## 2023-02-17 ENCOUNTER — Ambulatory Visit (INDEPENDENT_AMBULATORY_CARE_PROVIDER_SITE_OTHER): Payer: Medicare HMO | Admitting: Family Medicine

## 2023-02-17 ENCOUNTER — Encounter: Payer: Self-pay | Admitting: Family Medicine

## 2023-02-17 VITALS — BP 130/70 | HR 71 | Temp 97.9°F | Ht 60.0 in | Wt 175.2 lb

## 2023-02-17 DIAGNOSIS — Z131 Encounter for screening for diabetes mellitus: Secondary | ICD-10-CM

## 2023-02-17 DIAGNOSIS — E785 Hyperlipidemia, unspecified: Secondary | ICD-10-CM | POA: Diagnosis not present

## 2023-02-17 DIAGNOSIS — R739 Hyperglycemia, unspecified: Secondary | ICD-10-CM | POA: Diagnosis not present

## 2023-02-17 DIAGNOSIS — Z23 Encounter for immunization: Secondary | ICD-10-CM | POA: Diagnosis not present

## 2023-02-17 DIAGNOSIS — Z0000 Encounter for general adult medical examination without abnormal findings: Secondary | ICD-10-CM

## 2023-02-17 DIAGNOSIS — M810 Age-related osteoporosis without current pathological fracture: Secondary | ICD-10-CM

## 2023-02-17 DIAGNOSIS — I10 Essential (primary) hypertension: Secondary | ICD-10-CM

## 2023-02-17 LAB — COMPREHENSIVE METABOLIC PANEL
ALT: 14 U/L (ref 0–35)
AST: 15 U/L (ref 0–37)
Albumin: 4.2 g/dL (ref 3.5–5.2)
Alkaline Phosphatase: 56 U/L (ref 39–117)
BUN: 16 mg/dL (ref 6–23)
CO2: 31 mEq/L (ref 19–32)
Calcium: 9.6 mg/dL (ref 8.4–10.5)
Chloride: 103 mEq/L (ref 96–112)
Creatinine, Ser: 0.9 mg/dL (ref 0.40–1.20)
GFR: 58.81 mL/min — ABNORMAL LOW (ref >60.00)
Glucose, Bld: 115 mg/dL — ABNORMAL HIGH (ref 70–99)
Potassium: 4 mEq/L (ref 3.5–5.1)
Sodium: 143 mEq/L (ref 135–145)
Total Bilirubin: 0.5 mg/dL (ref 0.2–1.2)
Total Protein: 7 g/dL (ref 6.0–8.3)

## 2023-02-17 LAB — LIPID PANEL
Cholesterol: 216 mg/dL — ABNORMAL HIGH (ref 0–200)
HDL: 67.1 mg/dL (ref >39.00)
LDL Cholesterol: 134 mg/dL — ABNORMAL HIGH (ref 0–99)
NonHDL: 148.51
Total CHOL/HDL Ratio: 3
Triglycerides: 75 mg/dL (ref 0.0–149.0)
VLDL: 15 mg/dL (ref 0.0–40.0)

## 2023-02-17 LAB — CBC WITH DIFFERENTIAL/PLATELET
Basophils Absolute: 0 10*3/uL (ref 0.0–0.1)
Basophils Relative: 0.9 % (ref 0.0–3.0)
Eosinophils Absolute: 0.1 10*3/uL (ref 0.0–0.7)
Eosinophils Relative: 2.2 % (ref 0.0–5.0)
HCT: 40.8 % (ref 36.0–46.0)
Hemoglobin: 13.4 g/dL (ref 12.0–15.0)
Lymphocytes Relative: 16.5 % (ref 12.0–46.0)
Lymphs Abs: 0.8 10*3/uL (ref 0.7–4.0)
MCHC: 32.9 g/dL (ref 30.0–36.0)
MCV: 92.7 fl (ref 78.0–100.0)
Monocytes Absolute: 0.3 10*3/uL (ref 0.1–1.0)
Monocytes Relative: 5.7 % (ref 3.0–12.0)
Neutro Abs: 3.8 10*3/uL (ref 1.4–7.7)
Neutrophils Relative %: 74.7 % (ref 43.0–77.0)
Platelets: 167 10*3/uL (ref 150.0–400.0)
RBC: 4.4 Mil/uL (ref 3.87–5.11)
RDW: 13.8 % (ref 11.5–15.5)
WBC: 5.1 10*3/uL (ref 4.0–10.5)

## 2023-02-17 LAB — VITAMIN D 25 HYDROXY (VIT D DEFICIENCY, FRACTURES): VITD: 50.11 ng/mL (ref 30.00–100.00)

## 2023-02-17 LAB — HEMOGLOBIN A1C: Hgb A1c MFr Bld: 6.4 % (ref 4.6–6.5)

## 2023-02-17 NOTE — Patient Instructions (Addendum)
Let us know if you get any other COVID (fall) vaccines or TDAP (now recommended) at your pharmacy.  Prevnar 20 today  You are eligible to schedule your annual wellness visit with our nurse specialist Inetta Fermo.  Please consider scheduling this before you leave today  Please stop by lab before you go If you have mychart- we will send your results within 3 business days of [place] receiving them.  If you do not have mychart- we will call you about results within 5 business days of [place] receiving them.  *please also note that you will see labs on mychart as soon as they post. I will later go in and write notes on them- will say "notes from Dr. Durene Cal"   Recommended follow up: Return in about 1 year (around 02/17/2024) for physical or sooner if needed.Schedule b4 you leave.

## 2023-02-17 NOTE — Addendum Note (Signed)
Addended by: Gwenette Greet on: 02/17/2023 09:37 AM   Modules accepted: Orders

## 2023-02-17 NOTE — Progress Notes (Signed)
Phone [phone]   Subjective:  Patient presents today for their annual physical. Chief complaint-noted.   See problem oriented charting- ROS- full  review of systems was completed and negative except for: stress with care giving, some heel pain  The following were reviewed and entered/updated in epic: Past Medical History:  Diagnosis Date   Breast cancer (HCC)    Cancer (HCC)    breast cancer- left   Heart murmur    years ago. no recent issues   Hyperlipidemia    Hypertension    per pt treated in past   Osteoporosis 01/20/2014   (5) DEXA scan 08/31/2013 shows osteopenia with a T score of -2.0; zolendronate started April 2015, to be repeated every 6 months while on anastrozole.  2016 dexa stable. Repeat 2019   Personal history of chemotherapy    Personal history of radiation therapy    Patient Active Problem List   Diagnosis Date Noted   History of septic arthritis 07/26/2018    Priority: High   Osteoporosis 01/20/2014    Priority: High   History of breast cancer 06/30/2013    Priority: High   Hyperlipidemia 02/02/2017    Priority: Medium    Hyperglycemia 03/19/2015    Priority: Medium    Essential hypertension 08/03/2008    Priority: Medium    Choledocholithiasis with obstruction 09/03/2020    Priority: Low   Cellulitis of left breast 03/28/2017    Priority: Low   SKIN CANCER, HX OF 08/03/2008    Priority: Low   Past Surgical History:  Procedure Laterality Date   ABDOMINAL HYSTERECTOMY  1987   fibroids   BREAST BIOPSY     BREAST LUMPECTOMY  10/23/10   left   CHOLECYSTECTOMY N/A 07/30/2020   Procedure: LAPAROSCOPIC CHOLECYSTECTOMY WITH INTRAOPERATIVE CHOLANGIOGRAM AND ICG DYE;  Surgeon: Manus Rudd, MD;  Location: [facility] OR;  Service: General;  Laterality: N/A;   ENDOSCOPIC RETROGRADE CHOLANGIOPANCREATOGRAPHY (ERCP) WITH PROPOFOL N/A 08/01/2020   Procedure: ENDOSCOPIC RETROGRADE CHOLANGIOPANCREATOGRAPHY (ERCP) WITH PROPOFOL;  Surgeon: Rachael Fee, MD;   Location: [facility] ENDOSCOPY;  Service: Endoscopy;  Laterality: N/A;   PANCREATIC STENT PLACEMENT  08/01/2020   Procedure: PANCREATIC STENT PLACEMENT;  Surgeon: Rachael Fee, MD;  Location: [facility] ENDOSCOPY;  Service: Endoscopy;;   PORT-A-CATH REMOVAL  09/02/2011   Procedure: REMOVAL PORT-A-CATH;  Surgeon: Ernestene Mention, MD;  Location: [facility];  Service: General;  Laterality: Right;   REMOVAL OF STONES  08/01/2020   Procedure: REMOVAL OF STONES;  Surgeon: Rachael Fee, MD;  Location: [facility] ENDOSCOPY;  Service: Endoscopy;;   SPHINCTEROTOMY  08/01/2020   Procedure: Dennison Mascot;  Surgeon: Rachael Fee, MD;  Location: [facility] ENDOSCOPY;  Service: Endoscopy;;   TOTAL HIP ARTHROPLASTY Left 04/03/2017   Procedure: IRRIGATION AND DEBRIDIMENT LEFT HIP;  Surgeon: Samson Frederic, MD;  Location: [facility] ORS;  Service: Orthopedics;  Laterality: Left;    Family History  Problem Relation Age of Onset   COPD Father    Diabetes Mother    Dementia Mother    Kidney disease Mother        not on dialysis   Cancer Brother        jaw   Hypertension Brother    Breast cancer Paternal Aunt     Medications- reviewed and updated Current Outpatient Medications  Medication Sig Dispense Refill   BIOTIN PO Take 1,000 mcg by mouth.     Calcium Carbonate-Vit D-Min (CALCIUM 1200 PO) Take by mouth.     cholecalciferol (  VITAMIN D) 1000 units tablet Take 2,000 Units by mouth daily.     CYANOCOBALAMIN PO Take 1 tablet by mouth daily.      Multiple Vitamin (MULTIVITAMIN) tablet Take 1 tablet by mouth daily.     polyethylene glycol (MIRALAX / GLYCOLAX) 17 g packet Take 17 g by mouth daily.     pyridoxine (B-6) 100 MG tablet Take 100 mg by mouth daily.     rosuvastatin (CRESTOR) 10 MG tablet Take 1 tablet (10 mg total) by mouth once a week. 90 tablet 3   No current facility-administered medications for this visit.    Allergies-reviewed and updated No Known Allergies  Social History   Social History  Narrative   Married. Lives with husband. 3 children. 3 grandkids (22, 22, 18 in 2018).       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       Hobbies: working puzzles, reading, shoping, beach   Objective  Objective:  BP 130/70   Pulse 71   Temp 97.9 F (36.6 C)   Ht 5' (1.524 m)   Wt 175 lb 3.2 oz (79.5 kg)   SpO2 95%   BMI 34.22 kg/m  Gen: NAD, resting comfortably HEENT: Mucous membranes are moist. Oropharynx normal Neck: no thyromegaly CV: RRR no murmurs rubs or gallops Lungs: CTAB no crackles, wheeze, rhonchi Abdomen: soft/nontender/nondistended/normal bowel sounds. No rebound or guarding.  Ext: no edema Skin: warm, dry Neuro: grossly normal, moves all extremities, PERRLA   Assessment and Plan   82 y.o. female presenting for annual physical.  Health Maintenance counseling: 1. Anticipatory guidance: Patient counseled regarding regular dental exams -q6 months, eye exams - yearly,  avoiding smoking and second hand smoke , limiting alcohol to 1 beverage per day- doesn't drink , no illicit drugs.   2. Risk factor reduction:  Advised patient of need for regular exercise and diet rich and fruits and vegetables to reduce risk of heart attack and stroke.  Exercise- doing cubi daily until husband hurt- still getting 1-2 days a week.  Diet/weight management-down 2 lbs from last year- impressed with weight loss especially with stress- continue gradual weight loss.  Wt Readings from Last 3 Encounters:  02/17/23 175 lb 3.2 oz (79.5 kg)  02/14/22 177 lb (80.3 kg)  08/07/21 175 lb (79.4 kg)  3. Immunizations/screenings/ancillary studies= discussed prevnar 20 - she declines, recommended fall COVID shot Immunization History  Administered Date(s) Administered   Fluad Quad(high Dose 65+) 07/28/2019, 07/13/2020   Influenza Split 07/10/2011, 07/29/2012, 08/04/2013   Influenza Whole 07/18/2009   Influenza, High Dose Seasonal PF 08/15/2015, 07/02/2017, 07/26/2018, 07/25/2022    Influenza-Unspecified 07/31/2014, 07/31/2021   PFIZER Comirnaty(Gray Top)Covid-19 Tri-Sucrose Vaccine 09/16/2022   PFIZER(Purple Top)SARS-COV-2 Vaccination 10/27/2019, 11/17/2019, 05/26/2020, 04/22/2021   Pfizer Covid-19 Vaccine Bivalent Booster 4yrs & up 08/07/2021   Pneumococcal Conjugate-13 11/15/2013   Pneumococcal Polysaccharide-23 11/18/2005   Td 08/02/2002   Tdap 10/20/2012   Zoster Recombinat (Shingrix) 07/26/2018, 09/30/2018   Zoster, Live 02/21/2009  4. Cervical cancer screening- no history of abnormal pap smears- past age based screening recommendations- no vaginal bleeding or discharge 5. Breast cancer screening-  see below discussion with breast cancer history 6. Colon cancer screening -  colonoscopy 2009 with no repeat due to age 65. Skin cancer screening- follows with Dr. Margo Aye- skin cancer history. advised regular sunscreen use. Denies worrisome, changing, or new skin lesions.  8. Birth control/STD check- postmenopausal/monogamous 9. Osteoporosis screening at 65- see below 10. Smoking associated screening -  NEVER smoker  Status of chronic or acute concerns   #social update- caring for husband with Parkinsons- some memory issues/dementia- plus broke hip and then that failed- trying to get into [place] right now. A lot on her plate.  - offered therapist but has good support from church and wants to lean in  #History of breast cancer 2012 -treated with lumpectomy and chemotherapy.  Completed 5 years of antiestrogen therapy 12/24/2015.  Declines clinical breast exam. Continues yearly mammograms- 11/27/22   #hypertension S: in 2020 and 2021 BP trending down- has been able to come off BP medications. Most recently lisinopril stopped before 01/25/20 visit A/P: still doing well off medicine- continue to monitor    #hyperlipidemia S: compliant with rosuvastatin 100 mg once a week- myalgias/arthralgias on higher dose.  Cautious about increasing due to prediabetes.  A/P: hopefully  stable- update lipid panel today. Continue current meds for now - max tolerable dose    # Hyperglycemia/insulin resistance/prediabetes-peak A1c 6.2 S: Medication:  none Lab Results  Component Value Date   HGBA1C 6.1 02/14/2022   HGBA1C 6.2 02/13/2021   HGBA1C 6.2 (H) 07/30/2020  A/P: hopefully stable or improved- update a1c today. Continue without meds for now- continue exercise efforts and mild weight loss   #Osteoporosis- off fosamax. Due for DEXA but with husbands health wants to hold off until next year. Patient is compliant with calcium and vitamin D. Cubi is good for health but walking would be more helpful for bone density   #retrocalcaneal bursitis- it appears on back of right heel- offered sports medicine referral- she wants to think this over but we can enter this later if she decides- tough with schedule  Recommended follow up: Return in about 1 year (around 02/17/2024) for physical or sooner if needed.Schedule b4 you leave.  Lab/Order associations: fasting   ICD-10-CM   1. Preventative health care  Z00.00     2. Age-related osteoporosis without current pathological fracture  M81.0 VITAMIN D 25 Hydroxy (Vit-D Deficiency, Fractures)    3. Essential hypertension  I10     4. Hyperglycemia  R73.9 Hemoglobin A1c    5. Hyperlipidemia, unspecified hyperlipidemia type  E78.5 CBC with Differential/Platelet    Comprehensive metabolic panel    Lipid panel    6. Screening for diabetes mellitus  Z13.1 Hemoglobin A1c     No orders of the defined types were placed in this encounter.  Return precautions advised.  Tana Conch, MD

## 2023-03-18 DIAGNOSIS — Z1283 Encounter for screening for malignant neoplasm of skin: Secondary | ICD-10-CM | POA: Diagnosis not present

## 2023-03-18 DIAGNOSIS — L570 Actinic keratosis: Secondary | ICD-10-CM | POA: Diagnosis not present

## 2023-03-18 DIAGNOSIS — X32XXXD Exposure to sunlight, subsequent encounter: Secondary | ICD-10-CM | POA: Diagnosis not present

## 2023-03-18 DIAGNOSIS — D225 Melanocytic nevi of trunk: Secondary | ICD-10-CM | POA: Diagnosis not present

## 2023-03-24 ENCOUNTER — Telehealth: Payer: Self-pay | Admitting: Family Medicine

## 2023-03-24 NOTE — Telephone Encounter (Signed)
Patient requests her lab results from 02/17/23 CPE be printed and mailed to Patient's home address

## 2023-03-24 NOTE — Telephone Encounter (Signed)
Labs printed and mailed to pt.  °

## 2023-04-14 ENCOUNTER — Ambulatory Visit (INDEPENDENT_AMBULATORY_CARE_PROVIDER_SITE_OTHER): Payer: Medicare HMO | Admitting: Physician Assistant

## 2023-04-14 VITALS — BP 136/90 | HR 71 | Temp 97.8°F | Ht 60.0 in | Wt 176.2 lb

## 2023-04-14 DIAGNOSIS — R052 Subacute cough: Secondary | ICD-10-CM | POA: Diagnosis not present

## 2023-04-14 MED ORDER — PREDNISONE 20 MG PO TABS: 20.0000 mg | ORAL_TABLET | Freq: Two times a day (BID) | ORAL | 0 refills | Status: DC

## 2023-04-14 NOTE — Progress Notes (Signed)
Briana Smith is a 82 y.o. female here for a new problem.  History of Present Illness:   Chief Complaint  Patient presents with   Cough    Pt c/o lingering cough since May, coughing and expectorating green sputum.    Cough:  She developed cold symptoms in May 2024 and managed it with Nyquil and mucinex.  Her symptoms improved except her cough and  has persisted since then. She is occasionally coughing up green sputum and is wheezing.   She has mild shortness of breathe with exertion.  She has taken Allegra when her symptoms first presented but found no change in her symptoms.  She has not taken nasal spray in the past for similar symptoms.  She denies history of asthma, COPD, inhaler use for any issues, coughing up any blood. She has no history of smoking but was exposed to second hand smoke when younger.  No prior history of glaucoma.    Past Medical History:  Diagnosis Date   Breast cancer (HCC)    Cancer (HCC)    breast cancer- left   Heart murmur    years ago. no recent issues   Hyperlipidemia    Hypertension    per pt treated in past   Osteoporosis 01/20/2014   (5) DEXA scan 08/31/2013 shows osteopenia with a T score of -2.0; zolendronate started April 2015, to be repeated every 6 months while on anastrozole.  2016 dexa stable. Repeat 2019   Personal history of chemotherapy    Personal history of radiation therapy      Social History   Tobacco Use   Smoking status: Never   Smokeless tobacco: Never  Vaping Use   Vaping Use: Never used  Substance Use Topics   Alcohol use: No   Drug use: No    Past Surgical History:  Procedure Laterality Date   ABDOMINAL HYSTERECTOMY  1987   fibroids   BREAST BIOPSY     BREAST LUMPECTOMY  10/23/10   left   CHOLECYSTECTOMY N/A 07/30/2020   Procedure: LAPAROSCOPIC CHOLECYSTECTOMY WITH INTRAOPERATIVE CHOLANGIOGRAM AND ICG DYE;  Surgeon: Manus Rudd, MD;  Location: [facility] OR;  Service: General;  Laterality: N/A;   ENDOSCOPIC  RETROGRADE CHOLANGIOPANCREATOGRAPHY (ERCP) WITH PROPOFOL N/A 08/01/2020   Procedure: ENDOSCOPIC RETROGRADE CHOLANGIOPANCREATOGRAPHY (ERCP) WITH PROPOFOL;  Surgeon: Rachael Fee, MD;  Location: [facility] ENDOSCOPY;  Service: Endoscopy;  Laterality: N/A;   PANCREATIC STENT PLACEMENT  08/01/2020   Procedure: PANCREATIC STENT PLACEMENT;  Surgeon: Rachael Fee, MD;  Location: [facility] ENDOSCOPY;  Service: Endoscopy;;   PORT-A-CATH REMOVAL  09/02/2011   Procedure: REMOVAL PORT-A-CATH;  Surgeon: Ernestene Mention, MD;  Location: [facility];  Service: General;  Laterality: Right;   REMOVAL OF STONES  08/01/2020   Procedure: REMOVAL OF STONES;  Surgeon: Rachael Fee, MD;  Location: [facility] ENDOSCOPY;  Service: Endoscopy;;   SPHINCTEROTOMY  08/01/2020   Procedure: Dennison Mascot;  Surgeon: Rachael Fee, MD;  Location: [facility] ENDOSCOPY;  Service: Endoscopy;;   TOTAL HIP ARTHROPLASTY Left 04/03/2017   Procedure: IRRIGATION AND DEBRIDIMENT LEFT HIP;  Surgeon: Samson Frederic, MD;  Location: [facility] ORS;  Service: Orthopedics;  Laterality: Left;    Family History  Problem Relation Age of Onset   COPD Father    Diabetes Mother    Dementia Mother    Kidney disease Mother        not on dialysis   Cancer Brother        jaw   Hypertension Brother  Breast cancer Paternal Aunt     No Known Allergies  Current Medications:   Current Outpatient Medications:    BIOTIN PO, Take 1,000 mcg by mouth., Disp: , Rfl:    Calcium Carbonate-Vit D-Min (CALCIUM 1200 PO), Take by mouth., Disp: , Rfl:    cholecalciferol (VITAMIN D) 1000 units tablet, Take 2,000 Units by mouth daily., Disp: , Rfl:    Multiple Vitamin (MULTIVITAMIN) tablet, Take 1 tablet by mouth daily., Disp: , Rfl:    polyethylene glycol (MIRALAX / GLYCOLAX) 17 g packet, Take 17 g by mouth daily., Disp: , Rfl:    rosuvastatin (CRESTOR) 10 MG tablet, Take 1 tablet (10 mg total) by mouth once a week., Disp: 90 tablet, Rfl: 3   Review of Systems:    Review of Systems  HENT:  Positive for ear pain (mild right ear pain).   Respiratory:  Positive for cough, sputum production (green), shortness of breath (with exertion) and wheezing.        (-)coughing blood    Vitals:   Vitals:   04/14/23 1116  BP: (!) 140/90  Pulse: 71  Temp: 97.8 F (36.6 C)  TempSrc: Temporal  SpO2: 97%  Weight: 176 lb 4 oz (79.9 kg)  Height: 5' (1.524 m)     Body mass index is 34.42 kg/m.  Physical Exam:   Physical Exam Vitals and nursing note reviewed.  Constitutional:      General: She is not in acute distress.    Appearance: She is well-developed. She is not ill-appearing or toxic-appearing.  Cardiovascular:     Rate and Rhythm: Normal rate and regular rhythm.     Pulses: Normal pulses.     Heart sounds: Normal heart sounds, S1 normal and S2 normal.  Pulmonary:     Effort: Pulmonary effort is normal.     Breath sounds: Examination of the left-upper field reveals wheezing. Examination of the left-middle field reveals wheezing. Wheezing (expiratory) present.  Skin:    General: Skin is warm and dry.  Neurological:     Mental Status: She is alert.     GCS: GCS eye subscore is 4. GCS verbal subscore is 5. GCS motor subscore is 6.  Psychiatric:        Speech: Speech normal.        Behavior: Behavior normal. Behavior is cooperative.     Assessment and Plan:   Subacute cough No red flags on exam.   Suspect possible post-viral bronchitis/cough. Will initiate prednisone per orders.  I did request that we obtain xray to further evaluate cough x 2 months, however she would like to wait and see how prednisone does first. Discussed taking medications as prescribed. Reviewed return precautions including fever, SOB, worsening cough or other concerns. Push fluids and rest. I recommend that patient follow-up if symptoms worsen or persist despite treatment x 7-10 days, sooner if needed.  I,Shehryar Garment/textile technologist as a Neurosurgeon for [employer],  PA.,have documented all relevant documentation on the behalf of Jarold Motto, PA,as directed by  Jarold Motto, PA while in the presence of Jarold Motto, [place].  I, Jarold Motto, [place], have reviewed all documentation for this visit. The documentation on 04/14/23 for the exam, diagnosis, procedures, and orders are all accurate and complete.  Jarold Motto, PA-C

## 2023-04-14 NOTE — Patient Instructions (Addendum)
It was great to see you!  Start prednisone as prescribed  I would like to get an xray but we can see how you respond to prednisone first if you prefer  If you choose to get the xray, you can walk in at the [facility] location without a scheduled appointment.  The address is [address] It is across the street from [facility]. Lab and x-xray are located in the basement.   Hours of operation are M-F 8:30am to 5:00pm.  Please note that they are closed for lunch between 12:30 and 1:00pm.   If your symptoms do not improve, or if they worsen, let us know as soon as possible and we will get an xray to further evaluate  Take care,  Jarold Motto PA-C

## 2023-04-15 DIAGNOSIS — H5202 Hypermetropia, left eye: Secondary | ICD-10-CM | POA: Diagnosis not present

## 2023-04-28 ENCOUNTER — Ambulatory Visit (INDEPENDENT_AMBULATORY_CARE_PROVIDER_SITE_OTHER)
Admission: RE | Admit: 2023-04-28 | Discharge: 2023-04-28 | Disposition: A | Discharge status: Home / Self Care | Payer: Medicare HMO | Source: Ambulatory Visit | Attending: Physician Assistant | Admitting: Physician Assistant

## 2023-04-28 DIAGNOSIS — R0989 Other specified symptoms and signs involving the circulatory and respiratory systems: Secondary | ICD-10-CM | POA: Diagnosis not present

## 2023-04-28 DIAGNOSIS — R052 Subacute cough: Secondary | ICD-10-CM | POA: Diagnosis not present

## 2023-05-01 ENCOUNTER — Encounter: Payer: Self-pay | Admitting: Family Medicine

## 2023-05-01 ENCOUNTER — Ambulatory Visit: Payer: Medicare HMO | Admitting: Family Medicine

## 2023-05-01 VITALS — BP 124/72 | HR 78 | Temp 97.0°F | Ht 60.0 in | Wt 176.4 lb

## 2023-05-01 DIAGNOSIS — J301 Allergic rhinitis due to pollen: Secondary | ICD-10-CM | POA: Diagnosis not present

## 2023-05-01 DIAGNOSIS — K219 Gastro-esophageal reflux disease without esophagitis: Secondary | ICD-10-CM | POA: Diagnosis not present

## 2023-05-01 DIAGNOSIS — J309 Allergic rhinitis, unspecified: Secondary | ICD-10-CM | POA: Insufficient documentation

## 2023-05-01 DIAGNOSIS — R053 Chronic cough: Secondary | ICD-10-CM

## 2023-05-01 DIAGNOSIS — I10 Essential (primary) hypertension: Secondary | ICD-10-CM | POA: Diagnosis not present

## 2023-05-01 MED ORDER — PREDNISONE 20 MG PO TABS: ORAL_TABLET | ORAL | 0 refills | Status: DC

## 2023-05-01 MED ORDER — PANTOPRAZOLE SODIUM 40 MG PO TBEC: 40.0000 mg | DELAYED_RELEASE_TABLET | Freq: Every day | ORAL | 0 refills | Status: AC

## 2023-05-01 NOTE — Patient Instructions (Addendum)
Still awaiting x-ray result but to me looks encouraging  Start your allegra back each evening   Take pantoprazole before breakfast or dinner everyday for next month- stronger dose  Trial prednisone for 10 days  Recommended follow up: update me in 2-3 weeks if not resolved or sooner if worsens- especially if you get fever, shortness of breath. Or if returns anytime in next month- if not gone we need to refer to pulmonology for their opinion

## 2023-05-01 NOTE — Progress Notes (Signed)
Phone [phone] In person visit   Subjective:   Briana Smith is a 82 y.o. year old very pleasant female patient who presents for/with See problem oriented charting Chief Complaint  Patient presents with   Cough    Pt c/o ongoing cough with mucous, she came in a few weeks ago to get treated, neg covid, had chest xray tues not read by radiologist yet   Past Medical History-  Patient Active Problem List   Diagnosis Date Noted   History of septic arthritis 07/26/2018    Priority: High   Osteoporosis 01/20/2014    Priority: High   History of breast cancer 06/30/2013    Priority: High   Allergic rhinitis 05/01/2023    Priority: Medium    GERD (gastroesophageal reflux disease) 05/01/2023    Priority: Medium    Hyperlipidemia 02/02/2017    Priority: Medium    Hyperglycemia 03/19/2015    Priority: Medium    Essential hypertension 08/03/2008    Priority: Medium    Choledocholithiasis with obstruction 09/03/2020    Priority: Low   Cellulitis of left breast 03/28/2017    Priority: Low   SKIN CANCER, HX OF 08/03/2008    Priority: Low    Medications- reviewed and updated Current Outpatient Medications  Medication Sig Dispense Refill   BIOTIN PO Take 1,000 mcg by mouth.     Calcium Carbonate-Vit D-Min (CALCIUM 1200 PO) Take by mouth.     cholecalciferol (VITAMIN D) 1000 units tablet Take 2,000 Units by mouth daily.     Multiple Vitamin (MULTIVITAMIN) tablet Take 1 tablet by mouth daily.     pantoprazole (PROTONIX) 40 MG tablet Take 1 tablet (40 mg total) by mouth daily. 30 tablet 0   polyethylene glycol (MIRALAX / GLYCOLAX) 17 g packet Take 17 g by mouth daily.     predniSONE (DELTASONE) 20 MG tablet Take 2 pills for 3 days, 1 pill for 7 days 13 tablet 0   rosuvastatin (CRESTOR) 10 MG tablet Take 1 tablet (10 mg total) by mouth once a week. 90 tablet 3   No current facility-administered medications for this visit.     Objective:  BP 124/72   Pulse 78   Temp (!) 97  F (36.1 C)   Ht 5' (1.524 m)   Wt 176 lb 6.4 oz (80 kg)   SpO2 98%   BMI 34.45 kg/m  Gen: NAD, resting comfortably Edematous nasal turbinates with clear discharge, drainage in posterior pharynx noted CV: RRR no murmurs rubs or gallops Lungs: CTAB no crackles, wheeze, rhonchi Ext: Trace edema Skin: warm, dry     Assessment and Plan    # Chronic Cough in patient with underlying allergic rhinitis S: Patient presented on 04/14/2023 and was evaluated by Jarold Motto, PA.  She was treated with prednisone 40 mg daily for potential postviral cough-possibly related to prior bronchitis.  Chest x-ray was recommended the patient wanted to hold off to see if she improved-when she did not improve she went to get chest x-ray on Tuesday of this week and redo that is still pending on Friday   Original symptoms dated back to May 2024 (Allegra was not helpful initially-had not tried nasal sprays) and managed by NyQuil and Mucinex initially-most symptoms improved but cough persisted.  She was having some wheezing and was still coughing up green sputum at times.  Was experiencing mild shortness of breath with exertion.  No asthma or COPD reported.  No smoking but when younger had some secondhand  smoke exposure.  On my notes and had previously mentioned deep cough since chemo 03/03/2017  She reports overall doesn't feel that bad but just continues to cough. She did feel better on the steroid significantly so and sputum cleared but when she finished that- afterwards within a week or two the cough came back again- more green sputum. No sinus pressure but some runny nose (previously was not running) . She reflects back and wonders if she had bronchitis- was told had awful cough. COVID tests at home negative  -denies shortness of breath unless if she really exerts herself. No fever. Has had runny nose.  Some sore throat in the morning likely from postnasal drip  She is taking Pepcid OTC (available over the  counter without a prescription)  A/P: 82 year old female with 2 months of cough, qualifying as chronic cough.  Possible bronchitis to start this and did have decent response to 5-day course of prednisone for postviral cough -Lungs clear on exam today but did have some evidence of edema in nasal turbinates and postnasal drip - We decided to get more aggressive with the cough and place her on pantoprazole in case there is an element of reflux-she already takes Pepcid but we discussed this is stronger as well as place her on a 10-day course of prednisone - Also started Allegra back for underlying allergies-runny nose and sore throat in the morning indicator of postnasal drip - Discussed adding Tessalon to suppress cough with likely irritated cough syndrome but she declines this for now -Still pending chest x-ray result-we looked at this together I did not see an obvious pneumonia or mass but we will await final read -Prednisone not ideal for osteoporosis but thought short-term would be reasonable  #hypertension S: in 2020 and 2021 BP trending down- has been able to come off BP medications. Most recently lisinopril stopped before 01/25/20 visit  BP Readings from Last 3 Encounters:  05/01/23 124/72  04/14/23 (!) 136/90  02/17/23 130/70   A/P: Blood pressure is well-controlled-we did discuss that prednisone may increase the blood pressure short-term but I thought potential benefits likely outweigh risks   Recommended follow up: Return for as needed for new, worsening, persistent symptoms.  Lab/Order associations:   ICD-10-CM   1. Chronic cough  R05.3     2. Seasonal allergic rhinitis due to pollen  J30.1     3. Gastroesophageal reflux disease without esophagitis  K21.9     4. Essential hypertension  I10       Meds ordered this encounter  Medications   pantoprazole (PROTONIX) 40 MG tablet    Sig: Take 1 tablet (40 mg total) by mouth daily.    Dispense:  30 tablet    Refill:  0    predniSONE (DELTASONE) 20 MG tablet    Sig: Take 2 pills for 3 days, 1 pill for 7 days    Dispense:  13 tablet    Refill:  0    Return precautions advised.  Tana Conch, MD

## 2023-07-01 ENCOUNTER — Ambulatory Visit: Payer: Medicare HMO | Admitting: Sports Medicine

## 2023-07-01 VITALS — BP 124/78 | HR 76 | Ht 60.0 in | Wt 178.0 lb

## 2023-07-01 DIAGNOSIS — M7661 Achilles tendinitis, right leg: Secondary | ICD-10-CM

## 2023-07-01 DIAGNOSIS — G8929 Other chronic pain: Secondary | ICD-10-CM | POA: Diagnosis not present

## 2023-07-01 DIAGNOSIS — M545 Low back pain, unspecified: Secondary | ICD-10-CM | POA: Diagnosis not present

## 2023-07-01 MED ORDER — MELOXICAM 15 MG PO TABS: 15.0000 mg | ORAL_TABLET | Freq: Every day | ORAL | 0 refills | Status: DC

## 2023-07-01 NOTE — Patient Instructions (Signed)
-   Start meloxicam 15 mg daily x2 weeks. May use remaining meloxicam as needed once daily for pain control.  Do not to use additional NSAIDs while taking meloxicam.  May use Tylenol [phone] mg 2 to 3 times a day for breakthrough pain. Low back HEP  2 week follow up

## 2023-07-01 NOTE — Progress Notes (Signed)
Briana Smith D.[facility] [address] [place] [address] Phone: [phone]   Assessment and Plan:     1. Chronic bilateral low back pain without sciatica -Chronic with exacerbation, initial sports medicine visit - Most consistent with lumbar etiology of low back pain.  I suspect patient likely has degree of facet arthropathy and degenerative disc disease that is causing bilateral, nonradiating low back pain that is worsened with physical activity including yard work and caretaking for her husband - No red flag symptoms, so no imaging at today's visit - Start meloxicam 15 mg daily x2 weeks.   Do not to use additional NSAIDs while taking meloxicam.  May use Tylenol [phone] mg 2 to 3 times a day for breakthrough pain.  We discussed that I do not recommend long-term NSAID use due to patient age and comorbidities, so we will only use a 2-week course and then discontinue - Start HEP for low back  2. Right Achilles tendinitis -Acute, initial sports medicine visit - Palpable nodule over posterior right heel at Achilles tendon insertion consistent with thickening of Achilles tendon at calcaneal insertion with pain that is worsened with physical activity including yard work and caretaking for her husband - No red flag symptoms, so no imaging at today's visit - Start meloxicam 15 mg daily x2 weeks.   Do not to use additional NSAIDs while taking meloxicam.  May use Tylenol [phone] mg 2 to 3 times a day for breakthrough pain.  We discussed that I do not recommend long-term NSAID use due to patient age and comorbidities, so we will only use a 2-week course and then discontinue - Start HEP for Achilles tendinitis  15 additional minutes spent for educating Therapeutic Home Exercise Program.  This included exercises focusing on stretching, strengthening, with focus on eccentric aspects.   Long term goals include an improvement in range of motion, strength,  endurance as well as avoiding reinjury. Patient's frequency would include in 1-2 times a day, 3-5 times a week for a duration of 6-12 weeks. Proper technique shown and discussed handout in great detail with ATC.  All questions were discussed and answered.    Other orders - meloxicam (MOBIC) 15 MG tablet; Take 1 tablet (15 mg total) by mouth daily.    Pertinent previous records reviewed include none   Follow Up: 2 weeks for reevaluation.  If no improvement or worsening of symptoms, would obtain lumbar x-ray versus right ankle x-ray.  Would discontinue NSAIDs and could consider physical therapy versus Voltaren gel   Subjective:   I, [name], am serving as a Neurosurgeon for Doctor Richardean Sale  Chief Complaint: back and heel pain   HPI:   07/01/23 Patient is a 82 year old female complaining of back and heel pain. Patient states that her back started hurting last week she was getting out of a chair not a true pain but soreness that resolves once she starts walking.   Right heel bump that has been there for several months. Pain when walking tylenol for the pain and it doesn't do much for the pain   Relevant Historical Information: Hypertension, GERD  Additional pertinent review of systems negative.   Current Outpatient Medications:    BIOTIN PO, Take 1,000 mcg by mouth., Disp: , Rfl:    Calcium Carbonate-Vit D-Min (CALCIUM 1200 PO), Take by mouth., Disp: , Rfl:    cholecalciferol (VITAMIN D) 1000 units tablet, Take 2,000 Units by mouth daily., Disp: ,  Rfl:    meloxicam (MOBIC) 15 MG tablet, Take 1 tablet (15 mg total) by mouth daily., Disp: 14 tablet, Rfl: 0   Multiple Vitamin (MULTIVITAMIN) tablet, Take 1 tablet by mouth daily., Disp: , Rfl:    pantoprazole (PROTONIX) 40 MG tablet, Take 1 tablet (40 mg total) by mouth daily., Disp: 30 tablet, Rfl: 0   polyethylene glycol (MIRALAX / GLYCOLAX) 17 g packet, Take 17 g by mouth daily., Disp: , Rfl:    predniSONE (DELTASONE) 20 MG  tablet, Take 2 pills for 3 days, 1 pill for 7 days, Disp: 13 tablet, Rfl: 0   rosuvastatin (CRESTOR) 10 MG tablet, Take 1 tablet (10 mg total) by mouth once a week., Disp: 90 tablet, Rfl: 3   Objective:     Vitals:   07/01/23 1411  BP: 124/78  Pulse: 76  SpO2: 97%  Weight: 178 lb (80.7 kg)  Height: 5' (1.524 m)      Body mass index is 34.76 kg/m.    Physical Exam:    Gen: Appears well, nad, nontoxic and pleasant Psych: Alert and oriented, appropriate mood and affect Neuro: sensation intact, strength is 5/5 in upper and lower extremities, muscle tone wnl Skin: no susupicious lesions or rashes  Back - Normal skin, Spine with normal alignment and no deformity.   No tenderness to vertebral process palpation.   Paraspinous muscles are not tender and without spasm NTTP gluteal musculature Straight leg raise negative Gait normal Bilateral low back pain worsened by lumbar flexion, extension without radicular symptoms  Gen: Appears well, nad, nontoxic and pleasant Psych: Alert and oriented, appropriate mood and affect Neuro: sensation intact, strength is 5/5 with df/pf/inv/ev, muscle tone wnl Skin: no susupicious lesions or rashes  Right ankle:   no swelling or effusion 2 x 3 cm posterior calcaneal nodule at Achilles tendon insertion site that is TTP.  No deformity, open lesion, erythema NTTP over fibular head, lat mal, medial mal, achilles, navicular, base of 5th, ATFL, CFL, deltoid, or midfoot ROM DF 30, PF 45, inv/ev intact Negative ant drawer, talar tilt, rotation test, squeeze test. Neg thompson No pain with resisted inversion or eversion, dorsiflexion or plantarflexion  Electronically signed by:  Briana Smith D.[facility] 2:50 PM 07/01/23

## 2023-07-15 ENCOUNTER — Ambulatory Visit: Payer: Medicare HMO | Admitting: Sports Medicine

## 2023-07-15 NOTE — Progress Notes (Unsigned)
    Aleen Sells D.[facility] [address] [place] [address] Phone: [phone]   Assessment and Plan:     There are no diagnoses linked to this encounter.  ***   Pertinent previous records reviewed include ***   Follow Up: ***     Subjective:   I, Hakeem Frazzini, am serving as a Neurosurgeon for Doctor Richardean Sale   Chief Complaint: back and heel pain    HPI:    07/01/23 Patient is a 82 year old female complaining of back and heel pain. Patient states that her back started hurting last week she was getting out of a chair not a true pain but soreness that resolves once she starts walking.    Right heel bump that has been there for several months. Pain when walking tylenol for the pain and it doesn't do much for the pain   07/16/2023 Patient states  Relevant Historical Information: Hypertension, GERD  Additional pertinent review of systems negative.   Current Outpatient Medications:    BIOTIN PO, Take 1,000 mcg by mouth., Disp: , Rfl:    Calcium Carbonate-Vit D-Min (CALCIUM 1200 PO), Take by mouth., Disp: , Rfl:    cholecalciferol (VITAMIN D) 1000 units tablet, Take 2,000 Units by mouth daily., Disp: , Rfl:    meloxicam (MOBIC) 15 MG tablet, Take 1 tablet (15 mg total) by mouth daily., Disp: 14 tablet, Rfl: 0   Multiple Vitamin (MULTIVITAMIN) tablet, Take 1 tablet by mouth daily., Disp: , Rfl:    pantoprazole (PROTONIX) 40 MG tablet, Take 1 tablet (40 mg total) by mouth daily., Disp: 30 tablet, Rfl: 0   polyethylene glycol (MIRALAX / GLYCOLAX) 17 g packet, Take 17 g by mouth daily., Disp: , Rfl:    predniSONE (DELTASONE) 20 MG tablet, Take 2 pills for 3 days, 1 pill for 7 days, Disp: 13 tablet, Rfl: 0   rosuvastatin (CRESTOR) 10 MG tablet, Take 1 tablet (10 mg total) by mouth once a week., Disp: 90 tablet, Rfl: 3   Objective:     There were no vitals filed for this visit.    There is no height or weight on file to calculate  BMI.    Physical Exam:    ***   Electronically signed by:  Aleen Sells [facility] 3:25 PM 07/15/23

## 2023-07-16 ENCOUNTER — Ambulatory Visit: Payer: Medicare HMO | Admitting: Sports Medicine

## 2023-07-16 VITALS — HR 74 | Ht 60.0 in | Wt 175.0 lb

## 2023-07-16 DIAGNOSIS — G8929 Other chronic pain: Secondary | ICD-10-CM

## 2023-07-16 DIAGNOSIS — M7661 Achilles tendinitis, right leg: Secondary | ICD-10-CM | POA: Diagnosis not present

## 2023-07-16 DIAGNOSIS — M545 Low back pain, unspecified: Secondary | ICD-10-CM | POA: Diagnosis not present

## 2023-07-16 MED ORDER — METHYLPREDNISOLONE 4 MG PO TBPK: ORAL_TABLET | ORAL | 0 refills | Status: DC

## 2023-07-16 NOTE — Patient Instructions (Signed)
Prednisone dos pak  Voltaren gel over areas of pain  Achilles HEP daily  4 week follow up

## 2023-10-22 ENCOUNTER — Other Ambulatory Visit: Payer: Self-pay | Admitting: Family Medicine

## 2023-10-22 DIAGNOSIS — Z1231 Encounter for screening mammogram for malignant neoplasm of breast: Secondary | ICD-10-CM

## 2023-11-11 ENCOUNTER — Other Ambulatory Visit: Payer: Self-pay | Admitting: Family Medicine

## 2023-11-30 ENCOUNTER — Ambulatory Visit: Payer: Medicare HMO

## 2023-11-30 ENCOUNTER — Ambulatory Visit
Admission: RE | Admit: 2023-11-30 | Discharge: 2023-11-30 | Disposition: A | Discharge status: Home / Self Care | Payer: Medicare HMO | Source: Ambulatory Visit | Attending: Family Medicine | Admitting: Family Medicine

## 2023-11-30 DIAGNOSIS — Z1231 Encounter for screening mammogram for malignant neoplasm of breast: Secondary | ICD-10-CM

## 2023-12-02 DIAGNOSIS — D225 Melanocytic nevi of trunk: Secondary | ICD-10-CM | POA: Diagnosis not present

## 2023-12-02 DIAGNOSIS — L570 Actinic keratosis: Secondary | ICD-10-CM | POA: Diagnosis not present

## 2023-12-02 DIAGNOSIS — Z1283 Encounter for screening for malignant neoplasm of skin: Secondary | ICD-10-CM | POA: Diagnosis not present

## 2023-12-02 DIAGNOSIS — X32XXXD Exposure to sunlight, subsequent encounter: Secondary | ICD-10-CM | POA: Diagnosis not present

## 2023-12-28 ENCOUNTER — Ambulatory Visit (INDEPENDENT_AMBULATORY_CARE_PROVIDER_SITE_OTHER)

## 2023-12-28 ENCOUNTER — Ambulatory Visit: Admitting: Family Medicine

## 2023-12-28 VITALS — BP 168/90 | HR 66 | Ht 60.0 in

## 2023-12-28 DIAGNOSIS — M545 Low back pain, unspecified: Secondary | ICD-10-CM | POA: Diagnosis not present

## 2023-12-28 DIAGNOSIS — G8929 Other chronic pain: Secondary | ICD-10-CM

## 2023-12-28 DIAGNOSIS — M47816 Spondylosis without myelopathy or radiculopathy, lumbar region: Secondary | ICD-10-CM | POA: Diagnosis not present

## 2023-12-28 DIAGNOSIS — M1612 Unilateral primary osteoarthritis, left hip: Secondary | ICD-10-CM | POA: Diagnosis not present

## 2023-12-28 DIAGNOSIS — M25552 Pain in left hip: Secondary | ICD-10-CM | POA: Diagnosis not present

## 2023-12-28 MED ORDER — TIZANIDINE HCL 2 MG PO TABS: 2.0000 mg | ORAL_TABLET | Freq: Three times a day (TID) | ORAL | 1 refills | Status: AC | PRN

## 2023-12-28 NOTE — Progress Notes (Signed)
   Rubin Payor, PhD, LAT, ATC acting as a scribe for Clementeen Graham, MD.  Briana Smith is a 83 y.o. female who presents to [facility] at [facility] today for re-occurring LBP. Pt was previously seen by Dr. Jean Rosenthal on 07/16/23.  Today, pt c/o LBP returning Thursday. She is wondering if doing some yard work may have exacerbated her pain. Pt locates pain to the L-side of her low back.   Radiating pain: no LE numbness/tingling: no LE weakness: no Aggravates: hip flexion, trying to put her sock on. Treatments tried: meloxicam, IBU  Dx testing: 08/15/20 DEXA scan  Pertinent review of systems: No fevers or chills  Relevant historical information: Hypertension.  Breast cancer history.  History of osteoporosis and septic arthritis.   Exam:  BP (!) 168/90   Pulse 66   Ht 5' (1.524 m)   SpO2 98%   BMI 34.18 kg/m  General: Well Developed, well nourished, and in no acute distress.   MSK: Lspine: Normal appearing, Normal motion Nontender to palpation.  Lower extremity strength is intact. Pain is reproduced with hip flexion and rotation especially resisted.  Strength is intact and I am unable reproduce this pain with passive range of motion activities.     Lab and Radiology Results  X-ray images lumbar spine and left hip obtained today personally and independently interpreted.  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  No acute fractures are visible.  No aggressive appearing bony lesions.  Left hip: Mild hip arthritis bilaterally.  No acute fractures no aggressive appearing bony lesions.  Await formal radiology review     Assessment and Plan: 83 y.o. female with acute left low back pain ongoing for about a week.  This is an acute exacerbation of a chronic problem.  Pain due to muscle dysfunction.  Given her breast cancer history and osteoporosis history did obtain x-rays today which to my eyes do not show acute  abnormality.  Radiology overread is still pending.  We talked about options.  She would like to avoid physical therapy.  Plan for heating pad and low-dose tizanidine.  If not improved consider PT.   PDMP not reviewed this encounter. Orders Placed This Encounter  Procedures   DG HIP UNILAT WITH PELVIS 2-3 VIEWS LEFT    Standing Status:   Future    Number of Occurrences:   1    Expiration Date:   12/27/2024    Reason for Exam (SYMPTOM  OR DIAGNOSIS REQUIRED):   eval left low back pain    Preferred imaging location?:   [facility]   DG Lumbar Spine 2-3 Views    Standing Status:   Future    Number of Occurrences:   1    Expiration Date:   12/27/2024    Reason for Exam (SYMPTOM  OR DIAGNOSIS REQUIRED):   eval left low back pain    Preferred imaging location?:   [facility]   Meds ordered this encounter  Medications   tiZANidine (ZANAFLEX) 2 MG tablet    Sig: Take 1-2 tablets (2-4 mg total) by mouth every 8 (eight) hours as needed.    Dispense:  60 tablet    Refill:  1     Discussed warning signs or symptoms. Please see discharge instructions. Patient expresses understanding.   The above documentation has been reviewed and is accurate and complete Clementeen Graham, M.D.

## 2023-12-28 NOTE — Patient Instructions (Signed)
 Thank you for coming in today.   Please get an Xray today before you leave   Try the muscle relaxer, let us know how you do with that.   Try heat  Can consider referral for Physical Therapy is symptoms don't improve.

## 2024-01-07 ENCOUNTER — Ambulatory Visit: Admitting: Family Medicine

## 2024-01-07 ENCOUNTER — Other Ambulatory Visit: Payer: Self-pay

## 2024-01-07 VITALS — BP 152/84 | HR 66 | Ht 60.0 in | Wt 178.0 lb

## 2024-01-07 DIAGNOSIS — M25552 Pain in left hip: Secondary | ICD-10-CM | POA: Diagnosis not present

## 2024-01-07 NOTE — Progress Notes (Signed)
 Low back x-ray shows some arthritis at the base of the spine.  Otherwise it looks okay.

## 2024-01-07 NOTE — Patient Instructions (Addendum)
 Thank you for coming in today.   You received an injection today. Seek immediate medical attention if the joint becomes red, extremely painful, or is oozing fluid.   Let me know if not better and I will refer to physical therapy

## 2024-01-07 NOTE — Progress Notes (Signed)
   Rubin Payor, PhD, LAT, ATC acting as a scribe for Clementeen Graham, MD.  Briana Smith is a 83 y.o. female who presents to [facility] at [facility] today for cont'd LBP. Pt was last seen by Dr. Denyse Amass on 12/28/23 and was prescribed tizanidine, and advised to use a heating pad.  Today, pt reports LBP has cont'd, not improvement. Pt locates pain to the L-side of her low back. No radiating pain. No n/t.  Dx testing: 12/28/23 L-spine & L hip XR 08/15/20 DEXA scan   Pertinent review of systems: No fevers or chills  Relevant historical information: Hypertension.  History of breast cancer.   Exam:  BP (!) 152/84   Pulse 66   Ht 5' (1.524 m)   Wt 178 lb (80.7 kg)   SpO2 97%   BMI 34.76 kg/m  General: Well Developed, well nourished, and in no acute distress.   MSK: Left hip tender palpation along posterior lateral iliac crest.  Pain with hip abduction and trunk motion.    Lab and Radiology Results  Procedure: Real-time Ultrasound Guided Injection of left lateral hip iliac crest Device: Philips Affiniti 50G/GE Logiq Images permanently stored and available for review in PACS Verbal informed consent obtained.  Discussed risks and benefits of procedure. Warned about infection, bleeding, hyperglycemia damage to structures among others. Patient expresses understanding and agreement Time-out conducted.   Noted no overlying erythema, induration, or other signs of local infection.   Skin prepped in a sterile fashion.   Local anesthesia: Topical Ethyl chloride.   With sterile technique and under real time ultrasound guidance: 40 mg of Kenalog and 2 ml of Marcaine injected into iliac crest area of pain. Fluid seen entering the iliac crest.   Completed without difficulty   Pain moderately immediately resolved suggesting accurate placement of the medication.   Advised to call if fevers/chills, erythema, induration, drainage, or persistent bleeding.   Images permanently  stored and available for review in the ultrasound unit.  Impression: Technically successful ultrasound guided injection.        Assessment and Plan: 83 y.o. female with left lateral hip and left low back pain.  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  Plan for steroid injection today.  If not improving she would consider PT.  [place] on [place] she [place].   PDMP not reviewed this encounter. Orders Placed This Encounter  Procedures   [place] LIMITED JOINT SPACE STRUCTURES LOW RIGHT(NO LINKED CHARGES)    Reason for Exam (SYMPTOM  OR DIAGNOSIS REQUIRED):   hip pain    Preferred imaging location?:   [facility]   No orders of the defined types were placed in this encounter.    Discussed warning signs or symptoms. Please see discharge instructions. Patient expresses understanding.   The above documentation has been reviewed and is accurate and complete Clementeen Graham, M.D.

## 2024-01-07 NOTE — Progress Notes (Signed)
Left hip x-ray shows mild arthritis

## 2024-02-01 ENCOUNTER — Ambulatory Visit: Payer: Self-pay

## 2024-02-01 NOTE — Telephone Encounter (Signed)
 Recommend visit to discuss-there was an 820 available at time of this message-only prefer these are just scheduled in the future if we have any openings-there is benefits and risks to treatment that I would like to go over with her

## 2024-02-01 NOTE — Telephone Encounter (Signed)
 Copied from CRM [id number]. Topic: Clinical - Red Word Triage >> Feb 01, 2024  9:57 AM Dorthula Gavel H wrote: Red Word that prompted transfer to Nurse Triage: Pt states she was bitten by a tick yesterday.   Chief Complaint: Tick bite Symptoms: No symptoms  Frequency: Single tick Pertinent Negatives: Patient denies any current symptoms  Disposition: [] ED /[] Urgent Care (no appt availability in office) / [] Appointment(In office/virtual)/ []  [facility]/ [x] Home Care/ [] Refused Recommended Disposition /[] [facility]/ []  Follow-up with PCP Additional Notes: Patient calling due to a tick bite that was discovered yesterday. She states that the tick was possibly attached for 1-2 days and was flat when it was found. She states it was tiny and brown but is unsure of what type of tick it was. The patient denies any fevers, headache, or redness to the bite area. Patient would like a preventative prescription for a dose of Doxycycline  if possible. Please contact the patient with a response to this request when able.     Reason for Disposition  Unknown type of tick bite with no complications  Answer Assessment - Initial Assessment Questions 1. ATTACHED:  "Is the tick still on the skin?"  (e.g., yes, no, unsure)     No 2. ONSET - TICK STILL ATTACHED:  "How long do you think the tick has been on your skin?" (e.g., hours, days, unsure)  Note:  Is there a recent activity (camping, hiking) where the caller may have been exposed?     1-2 days  3. ONSET - TICK NOT STILL ATTACHED: "If the tick has been removed, how long do you think the tick was attached before you removed it?" (e.g., 5 hours, 2 days). "When was this?"     1 day ago  4. LOCATION: "Where is the tick bite located?" (e.g., arm, leg)     Back of leg 5. TYPE of TICK: "Is it a wood tick or a deer tick?" (e.g., deer tick, wood tick; unsure)     Unsure  6. SIZE of TICK: "How big is the tick?" (e.g., size of poppy seed, apple seed,  watermelon seed; unsure) Note: Deer ticks can be the size of a poppy seed (nymph) or an apple seed (adult).       Tiny dark brown tick  7. ENGORGED: "Did the tick look flat or engorged (full, swollen)?" (e.g., flat, engorged; unsure)     Flat  8. OTHER SYMPTOMS: "Do you have any other symptoms?" (e.g., fever, rash, redness at bite area, red ring around bite)     No  Protocols used: Tick Bite-A-AH

## 2024-02-01 NOTE — Telephone Encounter (Signed)
 Please see triage note and advise if you would like patient to be seen in office

## 2024-02-02 NOTE — Telephone Encounter (Signed)
 Please contact patient and schedule OV with PCP per recommendations

## 2024-02-13 ENCOUNTER — Other Ambulatory Visit: Payer: Self-pay | Admitting: Family Medicine

## 2024-04-06 ENCOUNTER — Encounter: Payer: Medicare HMO | Admitting: Family Medicine

## 2024-04-12 ENCOUNTER — Ambulatory Visit: Payer: Self-pay | Admitting: Family Medicine

## 2024-04-12 ENCOUNTER — Encounter: Payer: Self-pay | Admitting: Family Medicine

## 2024-04-12 ENCOUNTER — Ambulatory Visit (INDEPENDENT_AMBULATORY_CARE_PROVIDER_SITE_OTHER): Admitting: Family Medicine

## 2024-04-12 VITALS — BP 130/70 | HR 68 | Temp 97.9°F | Ht 60.0 in | Wt 175.6 lb

## 2024-04-12 DIAGNOSIS — Z131 Encounter for screening for diabetes mellitus: Secondary | ICD-10-CM

## 2024-04-12 DIAGNOSIS — M810 Age-related osteoporosis without current pathological fracture: Secondary | ICD-10-CM

## 2024-04-12 DIAGNOSIS — G720 Drug-induced myopathy: Secondary | ICD-10-CM | POA: Diagnosis not present

## 2024-04-12 DIAGNOSIS — Z79899 Other long term (current) drug therapy: Secondary | ICD-10-CM

## 2024-04-12 DIAGNOSIS — R739 Hyperglycemia, unspecified: Secondary | ICD-10-CM

## 2024-04-12 DIAGNOSIS — I10 Essential (primary) hypertension: Secondary | ICD-10-CM | POA: Diagnosis not present

## 2024-04-12 DIAGNOSIS — Z0000 Encounter for general adult medical examination without abnormal findings: Secondary | ICD-10-CM

## 2024-04-12 LAB — CBC WITH DIFFERENTIAL/PLATELET
Basophils Absolute: 0.1 K/uL (ref 0.0–0.1)
Basophils Relative: 1.2 % (ref 0.0–3.0)
Eosinophils Absolute: 0.1 K/uL (ref 0.0–0.7)
Eosinophils Relative: 2.6 % (ref 0.0–5.0)
HCT: 40.9 % (ref 36.0–46.0)
Hemoglobin: 13.5 g/dL (ref 12.0–15.0)
Lymphocytes Relative: 19.6 % (ref 12.0–46.0)
Lymphs Abs: 1 K/uL (ref 0.7–4.0)
MCHC: 33.1 g/dL (ref 30.0–36.0)
MCV: 92.3 fl (ref 78.0–100.0)
Monocytes Absolute: 0.3 K/uL (ref 0.1–1.0)
Monocytes Relative: 6.3 % (ref 3.0–12.0)
Neutro Abs: 3.6 K/uL (ref 1.4–7.7)
Neutrophils Relative %: 70.3 % (ref 43.0–77.0)
Platelets: 161 K/uL (ref 150.0–400.0)
RBC: 4.43 Mil/uL (ref 3.87–5.11)
RDW: 14 % (ref 11.5–15.5)
WBC: 5.1 K/uL (ref 4.0–10.5)

## 2024-04-12 LAB — COMPREHENSIVE METABOLIC PANEL WITH GFR
ALT: 19 U/L (ref 0–35)
AST: 21 U/L (ref 0–37)
Albumin: 4.4 g/dL (ref 3.5–5.2)
Alkaline Phosphatase: 48 U/L (ref 39–117)
BUN: 17 mg/dL (ref 6–23)
CO2: 29 meq/L (ref 19–32)
Calcium: 9.5 mg/dL (ref 8.4–10.5)
Chloride: 106 meq/L (ref 96–112)
Creatinine, Ser: 0.89 mg/dL (ref 0.40–1.20)
GFR: 59.12 mL/min — ABNORMAL LOW (ref >60.00)
Glucose, Bld: 103 mg/dL — ABNORMAL HIGH (ref 70–99)
Potassium: 4.6 meq/L (ref 3.5–5.1)
Sodium: 143 meq/L (ref 135–145)
Total Bilirubin: 0.6 mg/dL (ref 0.2–1.2)
Total Protein: 7.3 g/dL (ref 6.0–8.3)

## 2024-04-12 LAB — LIPID PANEL
Cholesterol: 170 mg/dL (ref 0–200)
HDL: 67.1 mg/dL (ref >39.00)
LDL Cholesterol: 91 mg/dL (ref 0–99)
NonHDL: 103.37
Total CHOL/HDL Ratio: 3
Triglycerides: 60 mg/dL (ref 0.0–149.0)
VLDL: 12 mg/dL (ref 0.0–40.0)

## 2024-04-12 LAB — VITAMIN D 25 HYDROXY (VIT D DEFICIENCY, FRACTURES): VITD: 49.3 ng/mL (ref 30.00–100.00)

## 2024-04-12 LAB — HEMOGLOBIN A1C: Hgb A1c MFr Bld: 6.3 % (ref 4.6–6.5)

## 2024-04-12 NOTE — Progress Notes (Addendum)
 Phone [phone]   Subjective:  Patient presents today for their annual physical. Chief complaint-noted.   See problem oriented charting- ROS- full  review of systems was completed and negative Per full ROS sheet completed by patient except for topics noted under acute/chronic concerns  The following were reviewed and entered/updated in epic: Past Medical History:  Diagnosis Date   Breast cancer (HCC)    Cancer (HCC)    breast cancer- left   Heart murmur    years ago. no recent issues   Hyperlipidemia    Hypertension    per pt treated in past   Osteoporosis 01/20/2014   (5) DEXA scan 08/31/2013 shows osteopenia with a T score of -2.0; zolendronate started April 2015, to be repeated every 6 months while on anastrozole .  2016 dexa stable. Repeat 2019   Personal history of chemotherapy    Personal history of radiation therapy    Patient Active Problem List   Diagnosis Date Noted   History of septic arthritis 07/26/2018    Priority: High   Osteoporosis 01/20/2014    Priority: High   History of breast cancer 06/30/2013    Priority: High   Allergic rhinitis 05/01/2023    Priority: Medium    GERD (gastroesophageal reflux disease) 05/01/2023    Priority: Medium    Hyperlipidemia 02/02/2017    Priority: Medium    Hyperglycemia 03/19/2015    Priority: Medium    Essential hypertension 08/03/2008    Priority: Medium    Choledocholithiasis with obstruction 09/03/2020    Priority: Low   Cellulitis of left breast 03/28/2017    Priority: Low   SKIN CANCER, HX OF 08/03/2008    Priority: Low   Past Surgical History:  Procedure Laterality Date   ABDOMINAL HYSTERECTOMY  1987   fibroids   BREAST BIOPSY     BREAST LUMPECTOMY  10/23/10   left   CHOLECYSTECTOMY N/A 07/30/2020   Procedure: LAPAROSCOPIC CHOLECYSTECTOMY WITH INTRAOPERATIVE CHOLANGIOGRAM AND ICG DYE;  Surgeon: Belinda Cough, MD;  Location: [facility] OR;  Service: General;  Laterality: N/A;   ENDOSCOPIC RETROGRADE  CHOLANGIOPANCREATOGRAPHY (ERCP) WITH PROPOFOL  N/A 08/01/2020   Procedure: ENDOSCOPIC RETROGRADE CHOLANGIOPANCREATOGRAPHY (ERCP) WITH PROPOFOL ;  Surgeon: Teressa Toribio SQUIBB, MD;  Location: [facility] ENDOSCOPY;  Service: Endoscopy;  Laterality: N/A;   PANCREATIC STENT PLACEMENT  08/01/2020   Procedure: PANCREATIC STENT PLACEMENT;  Surgeon: Teressa Toribio SQUIBB, MD;  Location: [facility] ENDOSCOPY;  Service: Endoscopy;;   PORT-A-CATH REMOVAL  09/02/2011   Procedure: REMOVAL PORT-A-CATH;  Surgeon: Elon CHRISTELLA Pacini, MD;  Location: [facility];  Service: General;  Laterality: Right;   REMOVAL OF STONES  08/01/2020   Procedure: REMOVAL OF STONES;  Surgeon: Teressa Toribio SQUIBB, MD;  Location: [facility] ENDOSCOPY;  Service: Endoscopy;;   SPHINCTEROTOMY  08/01/2020   Procedure: ANNETT;  Surgeon: Teressa Toribio SQUIBB, MD;  Location: [facility] ENDOSCOPY;  Service: Endoscopy;;   TOTAL HIP ARTHROPLASTY Left 04/03/2017   Procedure: IRRIGATION AND DEBRIDIMENT LEFT HIP;  Surgeon: Fidel Rogue, MD;  Location: [facility] ORS;  Service: Orthopedics;  Laterality: Left;    Family History  Problem Relation Age of Onset   COPD Father    Diabetes Mother    Dementia Mother    Kidney disease Mother        not on dialysis   Cancer Brother        jaw   Hypertension Brother    Breast cancer Paternal Aunt     Medications- reviewed and updated Current Outpatient Medications  Medication Sig  Dispense Refill   BIOTIN PO Take 1,000 mcg by mouth.     Calcium  Carbonate-Vit D-Min (CALCIUM  1200 PO) Take by mouth.     cholecalciferol (VITAMIN D ) 1000 units tablet Take 2,000 Units by mouth daily.     Multiple Vitamin (MULTIVITAMIN) tablet Take 1 tablet by mouth daily.     pantoprazole  (PROTONIX ) 40 MG tablet Take 1 tablet (40 mg total) by mouth daily. 30 tablet 0   polyethylene glycol (MIRALAX  / GLYCOLAX ) 17 g packet Take 17 g by mouth daily.     rosuvastatin  (CRESTOR ) 10 MG tablet Take 1 tablet by mouth once a week 13 tablet 0   tiZANidine   (ZANAFLEX ) 2 MG tablet Take 1-2 tablets (2-4 mg total) by mouth every 8 (eight) hours as needed. 60 tablet 1   No current facility-administered medications for this visit.    Allergies-reviewed and updated No Known Allergies  Social History   Social History Narrative   Widowed January 2025-daughter and husband moved in and will build very close property to be able to help. 3 children. 3 grandkids (22, 22, 18 in 2018).       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       Hobbies: working puzzles, reading, shoping, beach   Objective  Objective:  BP 130/70   Pulse 68   Temp 97.9 F (36.6 C)   Ht 5' (1.524 m)   Wt 175 lb 9.6 oz (79.7 kg)   SpO2 96%   BMI 34.29 kg/m  Gen: NAD, resting comfortably, appears younger than stated age HEENT: Mucous membranes are moist. Oropharynx normal Neck: no thyromegaly CV: RRR no murmurs rubs or gallops Lungs: CTAB no crackles, wheeze, rhonchi Abdomen: soft/nontender/nondistended/normal bowel sounds. No rebound or guarding.  Ext: no edema Skin: warm, dry Neuro: grossly normal, moves all extremities, PERRLA   Assessment and Plan   83 y.o. Smith presenting for annual physical.  Health Maintenance counseling: 1. Anticipatory guidance: Patient counseled regarding regular dental exams -q6 months, eye exams -yearly,  avoiding smoking and second hand smoke , limiting alcohol  to 1 beverage per day- doesn't drink , no illicit drugs .   2. Risk factor reduction:  Advised patient of need for regular exercise and diet rich and fruits and vegetables to reduce risk of heart attack and stroke.  Exercise- cubi daily last year but has fallen off wants to restart or do some walking- got on treadmill twice lately- I prefer that as better for density.  Diet/weight management-weight stable from last year- mild weight loss may be helpful- tries to eat reasonably healthy .  Wt Readings from Last 3 Encounters:  04/12/24 175 lb 9.6 oz (79.7 kg)  01/07/24  178 lb (80.7 kg)  07/16/23 175 lb (79.4 kg)  3. Immunizations/screenings/ancillary studies- may do fall COVID shot. Tetanus, Diphtheria, and Pertussis (Tdap) at pharmacy Immunization History  Administered Date(s) Administered   Fluad Quad(high Dose 65+) 07/28/2019, 07/13/2020   Influenza Split 07/10/2011, 07/29/2012, 08/04/2013   Influenza Whole 07/18/2009   Influenza, High Dose Seasonal PF 08/15/2015, 07/02/2017, 07/26/2018, 07/25/2022, 07/25/2023   Influenza-Unspecified 07/31/2014, 07/31/2021   PFIZER Comirnaty(Gray Top)Covid-19 Tri-Sucrose Vaccine 09/16/2022   PFIZER(Purple Top)SARS-COV-2 Vaccination 10/27/2019, 11/17/2019, 05/26/2020, 04/22/2021   PNEUMOCOCCAL CONJUGATE-20 02/17/2023   Pfizer Covid-19 Vaccine Bivalent Booster 17yrs & up 08/07/2021   Pfizer(Comirnaty)Fall Seasonal Vaccine 12 years and older 09/19/2023   Pneumococcal Conjugate-13 11/15/2013   Pneumococcal Polysaccharide-23 11/18/2005   Td 08/02/2002   Tdap 10/20/2012   Zoster Recombinant(Shingrix) 07/26/2018, 09/30/2018  Zoster, Live 02/21/2009   4. Cervical cancer screening- past age based screening recommendations - no vaginal bleeding or discharge 5. Breast cancer screening-  see below 6. Colon cancer screening - colonoscopy 2009 and no repeat due to age- no blood in stool or melena 7. Skin cancer screening- Dr. Shona regularly with skin cancer history advised regular sunscreen use. Denies worrisome, changing, or new skin lesions.  8. Birth control/STD check- postmenopausal and not dating 94. Osteoporosis screening at 65- see below 10. Smoking associated screening - NEVER smoker  Status of chronic or acute concerns   #social update- lost husband in January 2025   # Musculoskeletal-sees Dr. Joane or Dr. Leonce as needed for back and hip issues- doing better lately   #History of breast cancer 2012 -treated with lumpectomy and chemotherapy.  Completed 5 years of antiestrogen therapy 12/24/2015.  Continues yearly  mammograms   #hypertension S: in 2020 and 2021 BP trending down- has been able to come off BP medications. Most recently lisinopril  stopped before 01/25/20 visit BP Readings from Last 3 Encounters:  04/12/24 130/70  01/07/24 (!) 152/84  12/28/23 (!) 168/90  A/P: blood pressure looks great today- has been able to come off and stay off medicines- did have some higher #s when in pain earlier in the year   #hyperlipidemia S: compliant with rosuvastatin  10 mg weekly- max tolerable dose due to mylagias/arthralgias.  Cautious about increasing due to prediabetes as well Lab Results  Component Value Date   CHOL 216 (H) 02/17/2023   HDL 67.10 02/17/2023   LDLCALC 134 (H) 02/17/2023   LDLDIRECT 141.7 11/08/2013   TRIG 75.0 02/17/2023   CHOLHDL 3 02/17/2023  A/P: update lipids- likely continue current medications - since cannot tolerate higher doses with myalgias  # Hyperglycemia/insulin  resistance/prediabetes-peak A1c 6.2 S: Medication:  none Lab Results  Component Value Date   HGBA1C 6.4 02/17/2023   HGBA1C 6.1 02/14/2022   HGBA1C 6.2 02/13/2021   A/P: hopefully stable- update a1c today. Continue without meds for now    #Osteoporosis S: medication: none -prior fosamax  caused arthralgias .  She had been on Reclast  in the past when on anastrozole .  Last bone density with worst T score -2.7 in October 2019 then stable to slightly better in 2021 -feels could increase walking but had been tough year and this had fallen off  Patient is compliant with calcium  and vitamin D  Last vitamin D  Lab Results  Component Value Date   VD25OH 50.11 02/17/2023  A/P: #s hopefully stable- will update dxaa    # History of GERD presenting with chronic cough-improved on pantoprazole  over Pepcid  -resolved and came off  Recommended follow up: Return in about 6 months (around 10/13/2024) for followup or sooner if needed.Schedule b4 you leave. Even if not definitely 1 year for physical   Lab/Order  associations: fasting   ICD-10-CM   1. Preventative health care  Z00.00     2. Age-related osteoporosis without current pathological fracture  M81.0 VITAMIN D  25 Hydroxy (Vit-D Deficiency, Fractures)    DG Bone Density    3. Essential hypertension  I10 Comprehensive metabolic panel with GFR    CBC with Differential/Platelet    Lipid panel    4. Hyperglycemia  R73.9 Hemoglobin A1c    5. Screening for diabetes mellitus  Z13.1 Hemoglobin A1c    6. 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   Return precautions advised.  Garnette Lukes, MD

## 2024-04-12 NOTE — Patient Instructions (Addendum)
 Health Maintenance Due  Topic Date Due   Medicare Annual Wellness (AWV)  11/19/2022  You are eligible to schedule your annual wellness visit with our nurse specialist [place].  Please consider scheduling this before you leave today  Recommend Tetanus, Diphtheria, and Pertussis (Tdap) at pharmacy  Please stop by lab before you go If you have mychart- we will send your results within 3 business days of us  receiving them.  If you do not have mychart- we will call you about results within 5 business days of us  receiving them.  *please also note that you will see labs on mychart as soon as they post. I will later go in and write notes on them- will say notes from Dr. Katrinka   Schedule your bone density test at check out desk.  - located [address] across the street from [facility] - in the basement - you DO NEED an appointment for the bone density tests.    Recommended follow up: Return in about 6 months (around 10/13/2024) for followup or sooner if needed.Schedule b4 you leave. Even if not definitely 1 year for physical

## 2024-04-26 ENCOUNTER — Ambulatory Visit (INDEPENDENT_AMBULATORY_CARE_PROVIDER_SITE_OTHER)
Admission: RE | Admit: 2024-04-26 | Discharge: 2024-04-26 | Disposition: A | Discharge status: Home / Self Care | Source: Ambulatory Visit | Attending: Family Medicine | Admitting: Family Medicine

## 2024-04-26 DIAGNOSIS — M810 Age-related osteoporosis without current pathological fracture: Secondary | ICD-10-CM

## 2024-05-03 ENCOUNTER — Other Ambulatory Visit: Payer: Self-pay | Admitting: *Deleted

## 2024-05-03 DIAGNOSIS — M810 Age-related osteoporosis without current pathological fracture: Secondary | ICD-10-CM

## 2024-05-04 DIAGNOSIS — Z0100 Encounter for examination of eyes and vision without abnormal findings: Secondary | ICD-10-CM | POA: Diagnosis not present

## 2024-05-04 DIAGNOSIS — H5203 Hypermetropia, bilateral: Secondary | ICD-10-CM | POA: Diagnosis not present

## 2024-05-12 ENCOUNTER — Other Ambulatory Visit: Payer: Self-pay | Admitting: Family Medicine

## 2024-06-08 DIAGNOSIS — C44311 Basal cell carcinoma of skin of nose: Secondary | ICD-10-CM | POA: Diagnosis not present

## 2024-06-27 ENCOUNTER — Ambulatory Visit: Admitting: Physician Assistant

## 2024-06-27 VITALS — Ht 60.5 in | Wt 180.8 lb

## 2024-06-27 DIAGNOSIS — M8000XA Age-related osteoporosis with current pathological fracture, unspecified site, initial encounter for fracture: Secondary | ICD-10-CM | POA: Diagnosis not present

## 2024-06-27 MED ORDER — ALENDRONATE SODIUM 70 MG PO TABS: 70.0000 mg | ORAL_TABLET | ORAL | 4 refills | Status: AC

## 2024-06-27 NOTE — Progress Notes (Signed)
 Office Visit Note   Patient: Briana Smith           Date of Birth: 02-11-1941           MRN: [id number] Visit Date: 06/27/2024              Requested by: Katrinka Garnette KIDD, MD [address] [place],  [place] [address] PCP: Katrinka Garnette KIDD, MD   Assessment & Plan: Visit Diagnoses:  1. Age-related osteoporosis with current pathological fracture, initial encounter     Plan: Patient is a pleasant 83 year old woman who comes in referral from Dr. Katrinka for evaluation of osteoporosis.  She has previously taken Prolia but did not like the shots and had significant myalgias.  She has taking I will Dondero Nate in the past as well had some knee pain and thought this might be secondary as well.  She has not had a fracture her bone density scan demonstrates a FRAX calculation risk at 14.9% for major fracture and 4.5% for hip fracture she is has low bone density.  I do not think she would be appropriate for anabolic's.  She does have a history of breast cancer.  No history of kidney disease though slight decreased GFR.  She has never had ulcers bypass or right reflux.  She did have a hysterectomy when she was 45 and did do hormone replacement therapy.  She takes 600 mg of calcium  a day and 5000 units of vitamin D  a day.  She is not a smoker or drinker she was previously walking on a treadmill and does have a [employer] at home.  She has to get back to this as she lost her husband earlier this year and is just had trouble dealing with this and getting back to exercise but would like to do so.  I think she would be good for Prolia however she does not want to do shots.  Could try her again on Fosamax  to see if she tolerates this better she should continue to get metabolic screens from her primary care provider.  I do not think there is a place for anabolic's.  I explained the side effects of all these medications including atypical femur fractures and avascular necrosis of the jaws and his myalgias.   She would like to try the Fosamax  again.  We also discussed she will track her calcium  to see that she is in the correct zone for this.  Vitamin D  is adequate.  45 minutes was spent today reviewing her chart discussing with her medications possible treatments versus simple observation.  Will call to follow-up with me as needed  Follow-Up Instructions: Return if symptoms worsen or fail to improve.   Orders:  No orders of the defined types were placed in this encounter.  No orders of the defined types were placed in this encounter.     Procedures: No procedures performed   Clinical Data: No additional findings.   Subjective: Chief Complaint  Patient presents with   Osteoporosis    HPI pleasant 83 year old woman referred for evaluation of osteoporosis by Dr. Katrinka  Review of Systems  All other systems reviewed and are negative.    Objective: Vital Signs: Ht 5' 0.5 (1.537 m)   Wt 180 lb 12.8 oz (82 kg)   BMI 34.73 kg/m   Physical Exam Constitutional:      Appearance: Normal appearance.  Pulmonary:     Effort: Pulmonary effort is normal.  Skin:    General:  Skin is warm and dry.  Neurological:     General: No focal deficit present.     Mental Status: She is alert and oriented to person, place, and time.     Ortho Exam  Specialty Comments:  No specialty comments available.  Imaging: No results found.   PMFS History: Patient Active Problem List   Diagnosis Date Noted   Allergic rhinitis 05/01/2023   GERD (gastroesophageal reflux disease) 05/01/2023   Choledocholithiasis with obstruction 09/03/2020   History of septic arthritis 07/26/2018   Cellulitis of left breast 03/28/2017   Hyperlipidemia 02/02/2017   Hyperglycemia 03/19/2015   Osteoporosis 01/20/2014   History of breast cancer 06/30/2013   Essential hypertension 08/03/2008   SKIN CANCER, HX OF 08/03/2008   Past Medical History:  Diagnosis Date   Breast cancer (HCC)    Cancer (HCC)     breast cancer- left   Heart murmur    years ago. no recent issues   Hyperlipidemia    Hypertension    per pt treated in past   Osteoporosis 01/20/2014   (5) DEXA scan 08/31/2013 shows osteopenia with a T score of -2.0; zolendronate started April 2015, to be repeated every 6 months while on anastrozole .  2016 dexa stable. Repeat 2019   Personal history of chemotherapy    Personal history of radiation therapy     Family History  Problem Relation Age of Onset   COPD Father    Diabetes Mother    Dementia Mother    Kidney disease Mother        not on dialysis   Cancer Brother        jaw   Hypertension Brother    Breast cancer Paternal Aunt     Past Surgical History:  Procedure Laterality Date   ABDOMINAL HYSTERECTOMY  1987   fibroids   BREAST BIOPSY     BREAST LUMPECTOMY  10/23/10   left   CHOLECYSTECTOMY N/A 07/30/2020   Procedure: LAPAROSCOPIC CHOLECYSTECTOMY WITH INTRAOPERATIVE CHOLANGIOGRAM AND ICG DYE;  Surgeon: Belinda Cough, MD;  Location: [facility] OR;  Service: General;  Laterality: N/A;   ENDOSCOPIC RETROGRADE CHOLANGIOPANCREATOGRAPHY (ERCP) WITH PROPOFOL  N/A 08/01/2020   Procedure: ENDOSCOPIC RETROGRADE CHOLANGIOPANCREATOGRAPHY (ERCP) WITH PROPOFOL ;  Surgeon: Teressa Toribio SQUIBB, MD;  Location: [facility] ENDOSCOPY;  Service: Endoscopy;  Laterality: N/A;   PANCREATIC STENT PLACEMENT  08/01/2020   Procedure: PANCREATIC STENT PLACEMENT;  Surgeon: Teressa Toribio SQUIBB, MD;  Location[facility] ENDOSCOPY;  Service: Endoscopy;;   PORT-A-CATH REMOVAL  09/02/2011   Procedure: REMOVAL PORT-A-CATH;  Surgeon: Elon CHRISTELLA Pacini, MD;  Location: [facility];  Service: General;  Laterality: Right;   REMOVAL OF STONES  08/01/2020   Procedure: REMOVAL OF STONES;  Surgeon: Teressa Toribio SQUIBB, MD;  Location: [facility] ENDOSCOPY;  Service: Endoscopy;;   SPHINCTEROTOMY  08/01/2020   Procedure: ANNETT;  Surgeon: Teressa Toribio SQUIBB, MD;  Location: [facility] ENDOSCOPY;  Service: Endoscopy;;   TOTAL HIP ARTHROPLASTY  Left 04/03/2017   Procedure: IRRIGATION AND DEBRIDIMENT LEFT HIP;  Surgeon: Fidel Rogue, MD;  Location: [facility] ORS;  Service: Orthopedics;  Laterality: Left;   Social History   Occupational History   Not on file  Tobacco Use   Smoking status: Never   Smokeless tobacco: Never  Vaping Use   Vaping status: Never Used  Substance and Sexual Activity   Alcohol  use: No   Drug use: No   Sexual activity: Not on file

## 2024-07-26 DIAGNOSIS — X32XXXD Exposure to sunlight, subsequent encounter: Secondary | ICD-10-CM | POA: Diagnosis not present

## 2024-07-26 DIAGNOSIS — L570 Actinic keratosis: Secondary | ICD-10-CM | POA: Diagnosis not present

## 2024-07-26 DIAGNOSIS — Z08 Encounter for follow-up examination after completed treatment for malignant neoplasm: Secondary | ICD-10-CM | POA: Diagnosis not present

## 2024-07-26 DIAGNOSIS — Z85828 Personal history of other malignant neoplasm of skin: Secondary | ICD-10-CM | POA: Diagnosis not present

## 2024-09-21 ENCOUNTER — Telehealth: Payer: Self-pay | Admitting: Family Medicine

## 2024-09-21 NOTE — Telephone Encounter (Signed)
 Pt aware labs will be done and fasting is not required.    Copied from CRM #[id number]. Topic: Appointments - Appointment Info/Confirmation >> Sep 21, 2024  9:19 AM Deleta RAMAN wrote: Patient/patient representative is calling for information regarding an appointment. Would like to know if she will be getting blood work at 1/8 visit if so will she need to fast. Please contact [phone]. Aware of call back time

## 2024-10-04 ENCOUNTER — Other Ambulatory Visit: Payer: Self-pay | Admitting: Family Medicine

## 2024-10-13 ENCOUNTER — Encounter: Payer: Self-pay | Admitting: Family Medicine

## 2024-10-13 ENCOUNTER — Ambulatory Visit (INDEPENDENT_AMBULATORY_CARE_PROVIDER_SITE_OTHER): Admitting: Family Medicine

## 2024-10-13 ENCOUNTER — Ambulatory Visit

## 2024-10-13 ENCOUNTER — Ambulatory Visit: Payer: Self-pay | Admitting: Family Medicine

## 2024-10-13 VITALS — BP 128/72 | HR 65 | Temp 97.9°F | Ht 60.5 in | Wt 180.8 lb

## 2024-10-13 DIAGNOSIS — I10 Essential (primary) hypertension: Secondary | ICD-10-CM

## 2024-10-13 DIAGNOSIS — R739 Hyperglycemia, unspecified: Secondary | ICD-10-CM | POA: Diagnosis not present

## 2024-10-13 DIAGNOSIS — Z131 Encounter for screening for diabetes mellitus: Secondary | ICD-10-CM

## 2024-10-13 DIAGNOSIS — M810 Age-related osteoporosis without current pathological fracture: Secondary | ICD-10-CM | POA: Diagnosis not present

## 2024-10-13 DIAGNOSIS — R0609 Other forms of dyspnea: Secondary | ICD-10-CM

## 2024-10-13 DIAGNOSIS — E785 Hyperlipidemia, unspecified: Secondary | ICD-10-CM

## 2024-10-13 LAB — COMPREHENSIVE METABOLIC PANEL WITH GFR
ALT: 15 U/L (ref 3–35)
AST: 16 U/L (ref 5–37)
Albumin: 4.4 g/dL (ref 3.5–5.2)
Alkaline Phosphatase: 48 U/L (ref 39–117)
BUN: 25 mg/dL — ABNORMAL HIGH (ref 6–23)
CO2: 30 meq/L (ref 19–32)
Calcium: 9.5 mg/dL (ref 8.4–10.5)
Chloride: 106 meq/L (ref 96–112)
Creatinine, Ser: 0.83 mg/dL (ref 0.40–1.20)
GFR: 64.06 mL/min
Glucose, Bld: 111 mg/dL — ABNORMAL HIGH (ref 70–99)
Potassium: 4.8 meq/L (ref 3.5–5.1)
Sodium: 142 meq/L (ref 135–145)
Total Bilirubin: 0.4 mg/dL (ref 0.2–1.2)
Total Protein: 7.3 g/dL (ref 6.0–8.3)

## 2024-10-13 LAB — CBC WITH DIFFERENTIAL/PLATELET
Basophils Absolute: 0.1 K/uL (ref 0.0–0.1)
Basophils Relative: 1.1 % (ref 0.0–3.0)
Eosinophils Absolute: 0.1 K/uL (ref 0.0–0.7)
Eosinophils Relative: 1.8 % (ref 0.0–5.0)
HCT: 40.3 % (ref 36.0–46.0)
Hemoglobin: 13.2 g/dL (ref 12.0–15.0)
Lymphocytes Relative: 18.6 % (ref 12.0–46.0)
Lymphs Abs: 0.9 K/uL (ref 0.7–4.0)
MCHC: 32.8 g/dL (ref 30.0–36.0)
MCV: 92.3 fl (ref 78.0–100.0)
Monocytes Absolute: 0.2 K/uL (ref 0.1–1.0)
Monocytes Relative: 5.2 % (ref 3.0–12.0)
Neutro Abs: 3.4 K/uL (ref 1.4–7.7)
Neutrophils Relative %: 73.3 % (ref 43.0–77.0)
Platelets: 159 K/uL (ref 150.0–400.0)
RBC: 4.36 Mil/uL (ref 3.87–5.11)
RDW: 14.3 % (ref 11.5–15.5)
WBC: 4.6 K/uL (ref 4.0–10.5)

## 2024-10-13 LAB — HEMOGLOBIN A1C: Hgb A1c MFr Bld: 6.2 % (ref 4.6–6.5)

## 2024-10-13 LAB — TSH: TSH: 2.39 u[IU]/mL (ref 0.35–5.50)

## 2024-10-13 NOTE — Progress Notes (Signed)
 " Phone [phone] In person visit   Subjective:   Briana Smith is a 84 y.o. year old very pleasant female patient who presents for/with See problem oriented charting Chief Complaint  Patient presents with   Medical Management of Chronic Issues    6 month follow up; pt has noticed lately she's been a little more short of breath than normal when she exerts herself; pt states this has been the worse year she has had since she lost her husband;     Past Medical History-  Patient Active Problem List   Diagnosis Date Noted   History of septic arthritis 07/26/2018    Priority: High   Osteoporosis 01/20/2014    Priority: High   History of breast cancer 06/30/2013    Priority: High   Allergic rhinitis 05/01/2023    Priority: Medium    GERD (gastroesophageal reflux disease) 05/01/2023    Priority: Medium    Hyperlipidemia 02/02/2017    Priority: Medium    Hyperglycemia 03/19/2015    Priority: Medium    Essential hypertension 08/03/2008    Priority: Medium    Choledocholithiasis with obstruction 09/03/2020    Priority: Low   Cellulitis of left breast 03/28/2017    Priority: Low   SKIN CANCER, HX OF 08/03/2008    Priority: Low    Medications- reviewed and updated Current Outpatient Medications  Medication Sig Dispense Refill   BIOTIN PO Take 1,000 mcg by mouth.     Calcium  Carbonate-Vit D-Min (CALCIUM  1200 PO) Take by mouth.     rosuvastatin  (CRESTOR ) 10 MG tablet Take 1 tablet by mouth once a week 13 tablet 0   alendronate  (FOSAMAX ) 70 MG tablet Take 1 tablet (70 mg total) by mouth once a week. Take with a full glass of water on an empty stomach. (Patient not taking: Reported on 10/13/2024) 12 tablet 4   cholecalciferol (VITAMIN D ) 1000 units tablet Take 2,000 Units by mouth daily. (Patient not taking: Reported on 10/13/2024)     Multiple Vitamin (MULTIVITAMIN) tablet Take 1 tablet by mouth daily. (Patient not taking: Reported on 10/13/2024)     pantoprazole  (PROTONIX ) 40 MG  tablet Take 1 tablet (40 mg total) by mouth daily. (Patient not taking: Reported on 10/13/2024) 30 tablet 0   polyethylene glycol (MIRALAX  / GLYCOLAX ) 17 g packet Take 17 g by mouth daily. (Patient not taking: Reported on 10/13/2024)     tiZANidine  (ZANAFLEX ) 2 MG tablet Take 1-2 tablets (2-4 mg total) by mouth every 8 (eight) hours as needed. (Patient not taking: Reported on 10/13/2024) 60 tablet 1   No current facility-administered medications for this visit.     Objective:  BP 128/72 (BP Location: Left Arm, Patient Position: Sitting, Cuff Size: Normal)   Pulse 65   Temp 97.9 F (36.6 C) (Temporal)   Ht 5' 0.5 (1.537 m)   Wt 180 lb 12.8 oz (82 kg)   SpO2 97%   BMI 34.73 kg/m  Gen: NAD, resting comfortably CV: RRR no murmurs rubs or gallops Lungs: CTAB no crackles, wheeze, rhonchi Ext: minimal edema Skin: warm, dry     Assessment and Plan   #social update- lost husband January 2025 . Lost cat. Got scammed for 38k  -hospice grief counseling offered with phq9 of 8- she declines- wants to focus on her church support  # Dyspnea on exertion S: Patient recent months as noted she feels more short of breath when she exerts herself (like yard work yesterday felt winded after it). She  is more sedentary though -She has already had a long-term intermittent deep cough after radiation.  -Reassuring chest x-ray July 2024 for subacute cough at that point which had worsened. - no chest pain or left arm or neck pain or diaphoresis A/P: we decided jointly to pursue CXR as well as echocardiogram plus updating bloodwork. This could simply be deconditioning but we don't want to miss something.   - some leg stiffness if sits prolonged period- we discussed more frequent movement. No calf pain or swelling though  #History of breast cancer 2012 -treated with lumpectomy and chemotherapy.  Completed 5 years of antiestrogen therapy 12/24/2015.  Continues yearly mammograms- had last febaruary and will call to  schedule- we gave #  #hypertension S: in 2020 and 2021 BP trending down- has been able to come off BP medications. Most recently lisinopril  stopped before 01/25/20 visit BP Readings from Last 3 Encounters:  10/13/24 128/72  04/12/24 130/70  01/07/24 (!) 152/84  A/P: doing well off medicine- continue to monitor    #hyperlipidemia S: compliant with rosuvastatin  10 mg weekly- max tolerable dose due to mylagias/arthralgias.  Cautious about increasing due to prediabetes as well Lab Results  Component Value Date   CHOL 170 04/12/2024   HDL 67.10 04/12/2024   LDLCALC 91 04/12/2024   LDLDIRECT 141.7 11/08/2013   TRIG 60.0 04/12/2024   CHOLHDL 3 04/12/2024  A/P: max tolerable dose and reasonable control last visit- update levels annually- not yet due today  # Hyperglycemia/insulin  resistance/prediabetes-peak A1c 6.4 S: Medication:  none  Exercise and diet- not much exercise. Feels doing too many carbs Lab Results  Component Value Date   HGBA1C 6.3 04/12/2024   HGBA1C 6.4 02/17/2023   HGBA1C 6.1 02/14/2022  A/P: update a1c- I'm concerned could be stbale or owrse- would love to see improvement- encouraged healthy eating and regular exercise    #Osteoporosis-[facility] S: medication:Fosamax  prescription  again September 2025-she wanted to avoid injections like Prolia -she ended up not taking the fosamax  after 1 pill but no reaction yet and is doing more calcium  rich food   Last bone density with worst T score -2.7 in October 2019 and in 2025 in July worst T-score -2.0 at both right and left femoral neck but hip fracture risk elevated  Patient is compliant with calcium  and vitamin D  as above   A/P: osteoporosis- opts out of fosamax  or Prolia- wants to focus on dietary and try to start exercise- we will respect her decision though I think it coul dhelp   Recommended follow up: Return in about 6 months (around 04/12/2025) for physical or sooner if needed.Schedule  b4 you leave.  Lab/Order associations:   ICD-10-CM   1. DOE (dyspnea on exertion)  R06.09 DG Chest 2 View    ECHOCARDIOGRAM COMPLETE    2. Hyperlipidemia, unspecified hyperlipidemia type  E78.5 Comprehensive metabolic panel with GFR    CBC with Differential/Platelet    TSH    3. Age-related osteoporosis without current pathological fracture  M81.0     4. Essential hypertension  I10 Comprehensive metabolic panel with GFR    CBC with Differential/Platelet    TSH    5. Hyperglycemia  R73.9 Hemoglobin A1c    6. Screening for diabetes mellitus  Z13.1 Hemoglobin A1c      No orders of the defined types were placed in this encounter.   Return precautions advised.  Garnette Lukes, MD  "

## 2024-10-13 NOTE — Patient Instructions (Addendum)
 [facility] Center- [facility] [place] Schedule an appointment by calling [phone].  Lets get to moving again and working on healthy diet and we will make sure nothing going on from shortness of breath perspective with labs and x-ray as below- if worsens see me before 6 months  Please stop by lab before you go   ALSO STOP BY x-ray If you have mychart- we will send your results within 3 business days of us  receiving them.  If you do not have mychart- we will call you about results within 5 business days of us  receiving them.  *please also note that you will see labs on mychart as soon as they post. I will later go in and write notes on them- will say notes from Dr. Katrinka    Recommended follow up: Return in about 6 months (around 04/12/2025) for physical or sooner if needed.Schedule b4 you leave.

## 2024-10-20 ENCOUNTER — Other Ambulatory Visit: Payer: Self-pay | Admitting: Family Medicine

## 2024-10-20 DIAGNOSIS — Z1231 Encounter for screening mammogram for malignant neoplasm of breast: Secondary | ICD-10-CM

## 2024-11-02 ENCOUNTER — Telehealth (HOSPITAL_COMMUNITY): Payer: Self-pay | Admitting: Family Medicine

## 2024-11-02 NOTE — Telephone Encounter (Signed)
 We have attempted to contact the ordering providers office to obtain a prior authorization for the ordered test.  However, we have been unsuccesful. Order will be removed from the active order WQ. Once the prior authorization is obtained we will reinstate the order and schedule patient.    Order cancelled and Message sent to MD/LBW  10/26/24 Inbasket sent for final attempt of PA#  10/20/24 Inbakset sent for PA# x 2 10/13/24 Inbasket sent for PA#     Thank you

## 2024-11-03 NOTE — Telephone Encounter (Signed)
 Briana Smith- who were you sending these requests to? I see nothing in patient record. She still needs this.   Briana Smith team- can you work with referral team on this?

## 2024-11-08 ENCOUNTER — Other Ambulatory Visit: Payer: Self-pay | Admitting: Family Medicine

## 2024-11-08 DIAGNOSIS — R0609 Other forms of dyspnea: Secondary | ICD-10-CM

## 2024-11-08 NOTE — Telephone Encounter (Signed)
 Team did this get reopened? We need to get this done- perhaps message the stat chat? You can reorder same test under my name with same details if needed

## 2024-11-08 NOTE — Telephone Encounter (Signed)
 Looks like its scheduled for 12/02/2024. Need to be done earlier?

## 2024-11-30 ENCOUNTER — Ambulatory Visit

## 2024-12-02 ENCOUNTER — Other Ambulatory Visit (HOSPITAL_BASED_OUTPATIENT_CLINIC_OR_DEPARTMENT_OTHER)

## 2025-04-14 ENCOUNTER — Encounter: Admitting: Family Medicine
